# Patient Record
Sex: Male | Born: 1945 | Race: White | Hispanic: No | Marital: Married
Health system: Southern US, Community
[De-identification: ages and names within clinical notes are randomized; demographics above are authoritative.]

## PROBLEM LIST (undated history)

## (undated) DIAGNOSIS — H5440 Blindness, one eye, unspecified eye: Secondary | ICD-10-CM

## (undated) DIAGNOSIS — M6281 Muscle weakness (generalized): Secondary | ICD-10-CM

## (undated) DIAGNOSIS — I82493 Acute embolism and thrombosis of other specified deep vein of lower extremity, bilateral: Secondary | ICD-10-CM

## (undated) DIAGNOSIS — M810 Age-related osteoporosis without current pathological fracture: Secondary | ICD-10-CM

## (undated) DIAGNOSIS — I10 Essential (primary) hypertension: Secondary | ICD-10-CM

## (undated) DIAGNOSIS — G4700 Insomnia, unspecified: Secondary | ICD-10-CM

## (undated) DIAGNOSIS — L89300 Pressure ulcer of unspecified buttock, unstageable: Secondary | ICD-10-CM

## (undated) DIAGNOSIS — J189 Pneumonia, unspecified organism: Secondary | ICD-10-CM

## (undated) DIAGNOSIS — J90 Pleural effusion, not elsewhere classified: Secondary | ICD-10-CM

## (undated) DIAGNOSIS — E46 Unspecified protein-calorie malnutrition: Secondary | ICD-10-CM

## (undated) DIAGNOSIS — K5900 Constipation, unspecified: Secondary | ICD-10-CM

## (undated) DIAGNOSIS — Z9911 Dependence on respirator [ventilator] status: Secondary | ICD-10-CM

## (undated) DIAGNOSIS — I4891 Unspecified atrial fibrillation: Secondary | ICD-10-CM

## (undated) DIAGNOSIS — M069 Rheumatoid arthritis, unspecified: Secondary | ICD-10-CM

## (undated) DIAGNOSIS — J9620 Acute and chronic respiratory failure, unspecified whether with hypoxia or hypercapnia: Secondary | ICD-10-CM

## (undated) DIAGNOSIS — R498 Other voice and resonance disorders: Secondary | ICD-10-CM

## (undated) DIAGNOSIS — K219 Gastro-esophageal reflux disease without esophagitis: Secondary | ICD-10-CM

## (undated) DIAGNOSIS — G8251 Quadriplegia, C1-C4 complete: Secondary | ICD-10-CM

## (undated) DIAGNOSIS — G8929 Other chronic pain: Secondary | ICD-10-CM

## (undated) DIAGNOSIS — E876 Hypokalemia: Secondary | ICD-10-CM

## (undated) DIAGNOSIS — R339 Retention of urine, unspecified: Secondary | ICD-10-CM

## (undated) DIAGNOSIS — D649 Anemia, unspecified: Secondary | ICD-10-CM

## (undated) DIAGNOSIS — Z9181 History of falling: Secondary | ICD-10-CM

## (undated) DIAGNOSIS — N390 Urinary tract infection, site not specified: Secondary | ICD-10-CM

## (undated) DIAGNOSIS — R1312 Dysphagia, oropharyngeal phase: Secondary | ICD-10-CM

## (undated) DIAGNOSIS — I499 Cardiac arrhythmia, unspecified: Secondary | ICD-10-CM

## (undated) DIAGNOSIS — M1990 Unspecified osteoarthritis, unspecified site: Secondary | ICD-10-CM

## (undated) HISTORY — PX: TRACHEOSTOMY: SUR1362

## (undated) HISTORY — PX: VEIN SURGERY: SHX48

## (undated) HISTORY — DX: Essential (primary) hypertension: I10

## (undated) HISTORY — DX: Cardiac arrhythmia, unspecified: I49.9

## (undated) HISTORY — DX: Unspecified osteoarthritis, unspecified site: M19.90

## (undated) HISTORY — DX: Unspecified atrial fibrillation: I48.91

## (undated) HISTORY — PX: COLONOSCOPY, DIAGNOSTIC (SCREENING): SHX174

---
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
## 1973-06-13 DIAGNOSIS — I319 Disease of pericardium, unspecified: Secondary | ICD-10-CM

## 1973-06-13 HISTORY — DX: Disease of pericardium, unspecified: I31.9

## 1995-08-01 ENCOUNTER — Ambulatory Visit: Admit: 1995-08-01 | Disposition: A | Discharge status: Home / Self Care | Payer: Self-pay | Admitting: Internal Medicine

## 1995-08-11 ENCOUNTER — Ambulatory Visit: Admission: RE | Admit: 1995-08-11 | Payer: Self-pay | Source: Ambulatory Visit | Admitting: Gastroenterology

## 1996-06-17 ENCOUNTER — Ambulatory Visit: Admit: 1996-06-17 | Disposition: A | Discharge status: Home / Self Care | Payer: Self-pay

## 1997-08-28 ENCOUNTER — Ambulatory Visit: Admission: RE | Admit: 1997-08-28 | Payer: Self-pay | Source: Ambulatory Visit | Admitting: Gastroenterology

## 2003-07-24 ENCOUNTER — Ambulatory Visit: Admit: 2003-07-24 | Disposition: A | Discharge status: Home / Self Care | Payer: Self-pay | Source: Ambulatory Visit

## 2003-09-08 ENCOUNTER — Ambulatory Visit: Admission: AD | Admit: 2003-09-08 | Payer: Self-pay | Source: Ambulatory Visit | Admitting: Gastroenterology

## 2006-09-08 ENCOUNTER — Ambulatory Visit
Admission: AD | Admit: 2006-09-08 | Disposition: A | Discharge status: Home / Self Care | Payer: Self-pay | Source: Ambulatory Visit | Admitting: Internal Medicine

## 2006-10-02 ENCOUNTER — Ambulatory Visit
Admission: RE | Admit: 2006-10-02 | Disposition: A | Discharge status: Home / Self Care | Payer: Self-pay | Source: Ambulatory Visit | Admitting: Internal Medicine

## 2009-09-28 ENCOUNTER — Ambulatory Visit
Admission: RE | Admit: 2009-09-28 | Disposition: A | Discharge status: Home / Self Care | Payer: Self-pay | Source: Ambulatory Visit | Admitting: Internal Medicine

## 2012-06-14 NOTE — Op Note (Signed)
MRN: [id number] DOCUMENT ID: [id number]      INTRODUCTION:      67 YEAR OLD MALE PATIENT PRESENTS FOR AN ELECTIVE OUTPATIENT COLONOSCOPY.      THE INDICATIONS FOR THE PROCEDURE WERE FOLLOW-UP OF COLONIC POLYPS AND A      POSITIVE FAMILY HISTORY OF COLON CANCER.      CONSENT:      THE BENEFITS, RISKS, AND ALTERNATIVES TO THE PROCEDURE WERE DISCUSSED AND      INFORMED CONSENT WAS OBTAINED.      PREPARATION:      EKG, PULSE, PULSE OXIMETRY AND BLOOD PRESSURE MONITORED.      MEDICATIONS:      - MAC ANESTHESIA WAS ADMINISTERED      PROCEDURE:      RECTAL EXAM: NORMAL.      THE ENDOSCOPE WAS PASSED WITH A MODERATE AMOUNT OF DIFFICULTY TO THE CECUM      CONFIRMED BY APPENDICEAL ORIFICE AND ILEOCECAL VALVE.  RETROFLEXION WAS      PERFORMED IN THE RECTUM.  THE QUALITY OF THE PREPARATION WAS GOOD.  THE      STUDY WAS PERFORMED WITH A CF 160 AL AND CF 160 AL.      ASA CLASSIFICATION:      CLASS 2:  PATIENT HAS MILD TO MODERATE SYSTEMIC DISTURBANCE THAT MAY OR      MAY NOT BE RELATED TO THE DISORDER REQUIRING SURGERY.      FINDINGS:  SINGLE DIMINUTIVE POLYP WAS SEEN IN THE DISTAL ASCENDING COLON      WHICH MEASURED 6 MM.  DIMINUTIVE POLYP WAS SEEN IN THE TRANSVERSE COLON      WHICH MEASURED 5 MM.  ALL POLYPS WERE REMOVED BY EXCISIONAL BIOPSY.  THERE      WERE NUMEROUS DIVERTICULA PRESENT IN THE SIGMOID.  MODERATE INTERNAL      HEMORRHOIDS WERE PRESENT.      COMPLICATIONS:      THERE WERE NO COMPLICATIONS ASSOCIATED WITH THE PROCEDURE.      IMPRESSION:      1.  SINGLE DIMINUTIVE POLYP IN THE DISTAL ASCENDING COLON. [211.3].      2.  DIMINUTIVE POLYP IN THE TRANSVERSE COLON. [211.3]. ALL POLYPS WERE      REMOVED BY EXCISIONAL BIOPSY.      3.  NUMEROUS DIVERTICULA IN THE SIGMOID. [562.10].      4.  MODERATE INTERNAL HEMORRHOIDS [455.0].      RECOMMENDATION:      - AWAIT BIOPSY RESULTS.      - REPEAT COLONOSCOPY IN 5 YEARS.      SIGNING PHYSICIAN: Iyannah Blake L

## 2013-04-19 ENCOUNTER — Ambulatory Visit
Admission: RE | Admit: 2013-04-19 | Discharge: 2013-04-19 | Disposition: A | Discharge status: Home / Self Care | Payer: Medicare Other | Source: Ambulatory Visit | Attending: Internal Medicine | Admitting: Internal Medicine

## 2013-04-19 ENCOUNTER — Other Ambulatory Visit: Payer: Self-pay | Admitting: Internal Medicine

## 2013-04-19 DIAGNOSIS — Z01818 Encounter for other preprocedural examination: Secondary | ICD-10-CM | POA: Insufficient documentation

## 2013-10-29 ENCOUNTER — Other Ambulatory Visit: Payer: Self-pay | Admitting: Internal Medicine

## 2013-10-29 ENCOUNTER — Ambulatory Visit
Admission: RE | Admit: 2013-10-29 | Discharge: 2013-10-29 | Disposition: A | Discharge status: Home / Self Care | Payer: Medicare Other | Source: Ambulatory Visit | Attending: Internal Medicine | Admitting: Internal Medicine

## 2013-10-29 DIAGNOSIS — M545 Low back pain, unspecified: Secondary | ICD-10-CM

## 2014-03-07 ENCOUNTER — Ambulatory Visit: Payer: Medicare Other | Attending: Gastroenterology

## 2014-03-07 NOTE — Pre-Procedure Instructions (Signed)
Faxed cardio for recent workup, EKG/LON/any other tests, f [phone], p [phone].

## 2014-03-21 NOTE — Anesthesia Preprocedure Evaluation (Addendum)
Anesthesia Evaluation    AIRWAY    Mallampati: II    TM distance: >3 FB  Neck ROM: full  Mouth Opening:full   CARDIOVASCULAR    cardiovascular exam normal, irregular and normal       DENTAL    no notable dental hx     PULMONARY    pulmonary exam normal     OTHER FINDINGS              PSS Anesthesia Comments: HTN; A.fib on Eliquis; Echo - EF 60%; Last cardiac note under "Media" - "Outside Record" -J.Dorna Bloom        Anesthesia Plan    ASA 3     general                     intravenous induction   Detailed anesthesia plan: general IV  Monitors/Adjuncts: other    Post Op: other  Post op pain management: per surgeon    informed consent obtained

## 2014-03-26 ENCOUNTER — Ambulatory Visit
Admission: RE | Admit: 2014-03-26 | Discharge: 2014-03-26 | Disposition: A | Discharge status: Home / Self Care | Payer: Medicare Other | Source: Ambulatory Visit | Attending: Gastroenterology | Admitting: Gastroenterology

## 2014-03-26 ENCOUNTER — Ambulatory Visit: Payer: Medicare Other | Admitting: Physician Assistant

## 2014-03-26 ENCOUNTER — Encounter
Admission: RE | Disposition: A | Discharge status: Home / Self Care | Payer: Self-pay | Source: Ambulatory Visit | Attending: Gastroenterology

## 2014-03-26 ENCOUNTER — Ambulatory Visit: Payer: Medicare Other | Admitting: Gastroenterology

## 2014-03-26 ENCOUNTER — Ambulatory Visit: Payer: Self-pay

## 2014-03-26 DIAGNOSIS — K5730 Diverticulosis of large intestine without perforation or abscess without bleeding: Secondary | ICD-10-CM | POA: Insufficient documentation

## 2014-03-26 DIAGNOSIS — Z86010 Personal history of colonic polyps: Secondary | ICD-10-CM | POA: Insufficient documentation

## 2014-03-26 DIAGNOSIS — D120 Benign neoplasm of cecum: Secondary | ICD-10-CM | POA: Insufficient documentation

## 2014-03-26 DIAGNOSIS — Z1211 Encounter for screening for malignant neoplasm of colon: Secondary | ICD-10-CM

## 2014-03-26 DIAGNOSIS — Z7901 Long term (current) use of anticoagulants: Secondary | ICD-10-CM | POA: Insufficient documentation

## 2014-03-26 DIAGNOSIS — I10 Essential (primary) hypertension: Secondary | ICD-10-CM | POA: Insufficient documentation

## 2014-03-26 DIAGNOSIS — K648 Other hemorrhoids: Secondary | ICD-10-CM | POA: Insufficient documentation

## 2014-03-26 DIAGNOSIS — Z882 Allergy status to sulfonamides status: Secondary | ICD-10-CM | POA: Insufficient documentation

## 2014-03-26 DIAGNOSIS — Z800 Family history of malignant neoplasm of digestive organs: Secondary | ICD-10-CM | POA: Insufficient documentation

## 2014-03-26 DIAGNOSIS — Z87891 Personal history of nicotine dependence: Secondary | ICD-10-CM | POA: Insufficient documentation

## 2014-03-26 DIAGNOSIS — D123 Benign neoplasm of transverse colon: Secondary | ICD-10-CM | POA: Insufficient documentation

## 2014-03-26 HISTORY — PX: COLONOSCOPY: SHX174

## 2014-03-26 SURGERY — DONT USE, USE 1094-COLONOSCOPY, DIAGNOSTIC (SCREENING)
Anesthesia: Anesthesia General | Site: Abdomen | Wound class: Clean Contaminated

## 2014-03-26 MED ORDER — FENTANYL CITRATE 0.05 MG/ML IJ SOLN
INTRAMUSCULAR | Status: DC | PRN
Start: 2014-03-26 — End: 2014-03-26
  Administered 2014-03-26: 50 ug via INTRAVENOUS
  Administered 2014-03-26 (×2): 25 ug via INTRAVENOUS

## 2014-03-26 MED ORDER — LACTATED RINGERS IV SOLN
INTRAVENOUS | Status: DC
Start: 2014-03-26 — End: 2014-03-26

## 2014-03-26 MED ORDER — LIDOCAINE HCL 2 % IJ SOLN
INTRAMUSCULAR | Status: DC | PRN
Start: 2014-03-26 — End: 2014-03-26
  Administered 2014-03-26: 50 mg

## 2014-03-26 MED ORDER — FENTANYL CITRATE 0.05 MG/ML IJ SOLN
INTRAMUSCULAR | Status: AC
Start: 2014-03-26 — End: ?
  Filled 2014-03-26: qty 2

## 2014-03-26 MED ORDER — PROPOFOL 10 MG/ML IV EMUL
INTRAVENOUS | Status: AC
Start: 2014-03-26 — End: ?
  Filled 2014-03-26: qty 20

## 2014-03-26 MED ORDER — PROPOFOL 10 MG/ML IV EMUL
INTRAVENOUS | Status: DC | PRN
Start: 2014-03-26 — End: 2014-03-26
  Administered 2014-03-26: 50 mg via INTRAVENOUS
  Administered 2014-03-26: 40 mg via INTRAVENOUS
  Administered 2014-03-26: 50 mg via INTRAVENOUS
  Administered 2014-03-26: 70 mg via INTRAVENOUS

## 2014-03-26 MED ORDER — LIDOCAINE HCL 2 % IJ SOLN
INTRAMUSCULAR | Status: AC
Start: 2014-03-26 — End: ?
  Filled 2014-03-26: qty 20

## 2014-03-26 SURGICAL SUPPLY — 30 items
FORCEPS BIOPSY L240 CM JUMBO MICROMESH (Instrument)
FORCEPS BIOPSY L240 CM JUMBO MICROMESH TEETH STREAMLINE CATHETER (Instrument) IMPLANT
FORCEPS BIOPSY L240 CM LARGE CAPACITY (Instrument)
FORCEPS BIOPSY L240 CM MICROMESH TEETH STREAMLINE CATHETER NEEDLE (Instrument) IMPLANT
FORCEPS JAW RADIAL JUMBO (Instrument)
FORCEPS RAD JAW 4 BIOSPY W/NDL (Instrument)
GAUZE SPONGE 4X4 NS (Dressing) ×1
GLOVES EXAM NITRILE ETS LG NS (Glove) ×4 IMPLANT
GOWN CP ELSTC WRIST REG/LG BL (Gown) ×2
GOWN ISL PP PE REG LG LF FULL BCK NK TIE (Gown) ×2
GOWN ISOLATION REGULAR LARGE FULL BACK NECK TIE ELASTIC CUFF (Gown) ×2 IMPLANT
MARKER ENDOSCOPIC PERMANENT INDICATION (Syringes, Needles)
MARKER ENDOSCOPIC PERMANENT INDICATION DARK SYRINGE SPOT EX 5 ML (Syringes, Needles) IMPLANT
NEEDLE CARR-LOCKE INJECT 25GX5 (Needles) ×2 IMPLANT
PAD ELECTROSRG GRND REM W CRD (Procedure Accessories) ×1 IMPLANT
PROBE ELECTROSURGICAL L220 CM FLEXIBLE (Procedure Accessories)
PROBE ELECTROSURGICAL L220 CM FLEXIBLE STRAIGHT FIRE OD2.3 MM FIAPC (Procedure Accessories) IMPLANT
PROBE FIAPC STRGHT FIRE 220CM (Procedure Accessories)
SNARE CAPTIVATOR 13MMX240CM (GE Lab Supplies) ×1
SNARE ELECTRO SURG 20MM (Procedure Accessories) IMPLANT
SNARE ESCP MIC CPTVTR 13MM 240IN STRL (GE Lab Supplies) ×1
SNARE SENSATION OVAL 27MM (Endoscopic Supplies) IMPLANT
SNARE SMALL HEXAGON CAPTIVATOR STIFF ENDOSCOPIC POLYPECTOMY (GE Lab Supplies) IMPLANT
SPONGE GAUZE L4 IN X W4 IN 16 PLY (Dressing) ×1
SPONGE GAUZE L4 IN X W4 IN 16 PLY MAXIMUM ABSORBENT USP TYPE VII (Dressing) ×1 IMPLANT
SYRING SPOT EX ENDO MARK 5CC (Syringes, Needles)
SYRINGE 50 ML GRADUATE NONPYROGENIC DEHP (Syringes, Needles)
SYRINGE 50 ML GRADUATE NONPYROGENIC DEHP FREE PVC FREE BD MEDICAL (Syringes, Needles) IMPLANT
SYRINGE SLIP-TIP 60CC (Syringes, Needles)
TRAP  MUCOUS SPECIMEN 40CC (Procedure Accessories) ×1 IMPLANT

## 2014-03-26 NOTE — Progress Notes (Signed)
Patient meet discharge criteria at this time. Verbalized readiness to go home as well as his wife. Discharge instructions provided and explained to patient and family, verbalized good understanding.Patient aware when to follow-up with the doctor.

## 2014-03-26 NOTE — Discharge Instructions (Signed)
Endoscopy Discharge Instructions  General Instructions:  1. Following sedation, your judgement, perception, and coordination are considered impaired. Even though you may feel awake and alert, you are considered legally intoxicated. Therefore, until the next morning;   Do not Drive   Do not operate appliances or equipment that requires reaction time (e.g. Stove,electrical tools, machinery)   Do not sign legal documents or be involved in important decisions.   Do not smoke if alone   Do not drink alcoholic beverages   Go directly home and rest for several hours before resuming your routine activities.   It is highly recommended to have a responsible adult stay with you for the next 24 hours    2. Tenderness, swelling or pain may occur at the IV site where you received sedation. If you experience this, apply warm soaks to the area. Notify your physician if this persists.    Instructions Specific To Procedures - Report To Physician Any Of The Following:       Colonoscopy/Sigmoidoscopy/Proctoscopy   1. Severe and persistent abdominal pain/bloating which does not subside within 2-3 hours   2. Large amount of rectal bleeding (some mucosal blood streaking may occur, especially if biopsy or polypectomy was done or if hemorrhoids are present).   3. Nausea/vomiting   4. Fevers/Chills within 24 hours after procedure. Temp>101deg F      Additional Discharge Instructions  Your diet after the procedure: Continue prior diet  Special Instructions: Resume Eliquis on Friday.  Prescriptions given: No  Patient education literature given; Yes      If you have questions or problems contact your MD immediately. If you need immediate attention, call your MD, 911 and/or go to nearest emergency room.

## 2014-03-26 NOTE — Anesthesia Postprocedure Evaluation (Signed)
Anesthesia Post Evaluation    Patient: Ernest Diaz    Procedures performed: Procedure(s) with comments:  COLONOSCOPY - COLONOSCOPY  Q=NONE, ANES=MAC    Anesthesia type: General TIVA    Patient location:Phase II PACU    Last vitals:   Filed Vitals:    03/26/14 1442   BP: 149/80   Pulse: 74   Temp: 36.6 C (97.9 F)   SpO2: 98%       Post pain: Patient not complaining of pain, continue current therapy      Mental Status:awake    Respiratory Function: tolerating room air    Cardiovascular: stable    Nausea/Vomiting: patient not complaining of nausea or vomiting    Hydration Status: adequate    Post assessment: no apparent anesthetic complications

## 2014-03-26 NOTE — H&P (Signed)
GI PRE PROCEDURE NOTE    Proceduralist Comments:   Review of Systems and Past Medical / Surgical History performed: Yes     Indications:History of colonic polyps    Previous Adverse Reaction to Anesthesia or Sedation (if yes, describe): No    Physical Exam / Laboratory Data (If applicable)   Airway Classification: Class II    General: Alert and cooperative  Lungs: Lungs clear to auscultation  Cardiac: RRR, normal S1S2.    Abdomen: Soft, non tender. Normal active bowel sounds  Other:     No labs drawn    American Society of Anesthesiologists (ASA) Physical Status Classification:   ASA 3   Planned Sedation:   Deep sedation with anesthesia    Attestation:   Ernest Diaz III has been reassessed immediately prior to the procedure and is an appropriate candidate for the planned sedation and procedure. Risks, benefits and alternatives to the planned procedure and sedation have been explained to the patient or guardian:  yes        Signed by: Karen Kays

## 2014-03-26 NOTE — Transfer of Care (Signed)
Anesthesia Transfer of Care Note    Patient: Ernest Diaz    Procedures performed: Procedure(s) with comments:  COLONOSCOPY - COLONOSCOPY  Q=NONE, ANES=MAC    Anesthesia type: General TIVA    Patient location:Phase II PACU    Last vitals:   Filed Vitals:    03/26/14 1442   BP: 149/80   Pulse: 74   Temp: 36.6 C (97.9 F)   SpO2: 98%       Post pain: Patient not complaining of pain, continue current therapy      Mental Status:awake    Respiratory Function: tolerating room air    Cardiovascular: stable    Nausea/Vomiting: patient not complaining of nausea or vomiting    Hydration Status: adequate    Post assessment: no apparent anesthetic complications

## 2014-03-27 ENCOUNTER — Encounter: Payer: Self-pay | Admitting: Gastroenterology

## 2014-03-28 LAB — LAB USE ONLY - HISTORICAL SURGICAL PATHOLOGY

## 2015-03-26 ENCOUNTER — Other Ambulatory Visit: Payer: Self-pay

## 2015-04-10 ENCOUNTER — Other Ambulatory Visit: Payer: Self-pay

## 2015-08-17 ENCOUNTER — Inpatient Hospital Stay: Payer: Medicare Other | Admitting: Internal Medicine

## 2015-08-17 ENCOUNTER — Inpatient Hospital Stay
Admission: AD | Admit: 2015-08-17 | Discharge: 2015-09-24 | DRG: 003 | Disposition: A | Discharge status: Other Acute Inpatient Hospital | Payer: Medicare Other | Source: Other Acute Inpatient Hospital | Attending: Hospitalist | Admitting: Hospitalist

## 2015-08-17 ENCOUNTER — Other Ambulatory Visit: Payer: Self-pay

## 2015-08-17 DIAGNOSIS — G4700 Insomnia, unspecified: Secondary | ICD-10-CM | POA: Diagnosis not present

## 2015-08-17 DIAGNOSIS — Z7189 Other specified counseling: Secondary | ICD-10-CM | POA: Insufficient documentation

## 2015-08-17 DIAGNOSIS — T17500A Unspecified foreign body in bronchus causing asphyxiation, initial encounter: Secondary | ICD-10-CM | POA: Insufficient documentation

## 2015-08-17 DIAGNOSIS — T84226A Displacement of internal fixation device of vertebrae, initial encounter: Secondary | ICD-10-CM | POA: Diagnosis not present

## 2015-08-17 DIAGNOSIS — R0602 Shortness of breath: Secondary | ICD-10-CM | POA: Insufficient documentation

## 2015-08-17 DIAGNOSIS — Z882 Allergy status to sulfonamides status: Secondary | ICD-10-CM

## 2015-08-17 DIAGNOSIS — J690 Pneumonitis due to inhalation of food and vomit: Secondary | ICD-10-CM | POA: Diagnosis not present

## 2015-08-17 DIAGNOSIS — M4312 Spondylolisthesis, cervical region: Secondary | ICD-10-CM | POA: Diagnosis present

## 2015-08-17 DIAGNOSIS — J9602 Acute respiratory failure with hypercapnia: Secondary | ICD-10-CM | POA: Diagnosis not present

## 2015-08-17 DIAGNOSIS — Z66 Do not resuscitate: Secondary | ICD-10-CM | POA: Diagnosis not present

## 2015-08-17 DIAGNOSIS — G9340 Encephalopathy, unspecified: Secondary | ICD-10-CM | POA: Diagnosis not present

## 2015-08-17 DIAGNOSIS — I482 Chronic atrial fibrillation, unspecified: Secondary | ICD-10-CM

## 2015-08-17 DIAGNOSIS — Z7901 Long term (current) use of anticoagulants: Secondary | ICD-10-CM

## 2015-08-17 DIAGNOSIS — E872 Acidosis: Secondary | ICD-10-CM | POA: Diagnosis not present

## 2015-08-17 DIAGNOSIS — B9689 Other specified bacterial agents as the cause of diseases classified elsewhere: Secondary | ICD-10-CM | POA: Diagnosis not present

## 2015-08-17 DIAGNOSIS — D649 Anemia, unspecified: Secondary | ICD-10-CM | POA: Diagnosis not present

## 2015-08-17 DIAGNOSIS — E8770 Fluid overload, unspecified: Secondary | ICD-10-CM | POA: Diagnosis not present

## 2015-08-17 DIAGNOSIS — R52 Pain, unspecified: Secondary | ICD-10-CM | POA: Insufficient documentation

## 2015-08-17 DIAGNOSIS — I82812 Embolism and thrombosis of superficial veins of left lower extremities: Secondary | ICD-10-CM | POA: Diagnosis not present

## 2015-08-17 DIAGNOSIS — I4891 Unspecified atrial fibrillation: Secondary | ICD-10-CM

## 2015-08-17 DIAGNOSIS — A419 Sepsis, unspecified organism: Secondary | ICD-10-CM | POA: Diagnosis not present

## 2015-08-17 DIAGNOSIS — W19XXXA Unspecified fall, initial encounter: Secondary | ICD-10-CM | POA: Diagnosis present

## 2015-08-17 DIAGNOSIS — Z515 Encounter for palliative care: Secondary | ICD-10-CM | POA: Diagnosis not present

## 2015-08-17 DIAGNOSIS — I10 Essential (primary) hypertension: Secondary | ICD-10-CM | POA: Diagnosis present

## 2015-08-17 DIAGNOSIS — E876 Hypokalemia: Secondary | ICD-10-CM | POA: Diagnosis not present

## 2015-08-17 DIAGNOSIS — G9520 Unspecified cord compression: Secondary | ICD-10-CM

## 2015-08-17 DIAGNOSIS — S12600A Unspecified displaced fracture of seventh cervical vertebra, initial encounter for closed fracture: Secondary | ICD-10-CM | POA: Diagnosis present

## 2015-08-17 DIAGNOSIS — T17890A Other foreign object in other parts of respiratory tract causing asphyxiation, initial encounter: Secondary | ICD-10-CM | POA: Diagnosis present

## 2015-08-17 DIAGNOSIS — R579 Shock, unspecified: Secondary | ICD-10-CM | POA: Diagnosis not present

## 2015-08-17 DIAGNOSIS — J9601 Acute respiratory failure with hypoxia: Secondary | ICD-10-CM | POA: Diagnosis not present

## 2015-08-17 DIAGNOSIS — I770 Arteriovenous fistula, acquired: Secondary | ICD-10-CM

## 2015-08-17 DIAGNOSIS — J9600 Acute respiratory failure, unspecified whether with hypoxia or hypercapnia: Secondary | ICD-10-CM | POA: Diagnosis not present

## 2015-08-17 DIAGNOSIS — Z7982 Long term (current) use of aspirin: Secondary | ICD-10-CM

## 2015-08-17 DIAGNOSIS — Q288 Other specified congenital malformations of circulatory system: Secondary | ICD-10-CM

## 2015-08-17 DIAGNOSIS — I82411 Acute embolism and thrombosis of right femoral vein: Secondary | ICD-10-CM | POA: Diagnosis not present

## 2015-08-17 DIAGNOSIS — Z79899 Other long term (current) drug therapy: Secondary | ICD-10-CM

## 2015-08-17 DIAGNOSIS — R531 Weakness: Secondary | ICD-10-CM

## 2015-08-17 DIAGNOSIS — B952 Enterococcus as the cause of diseases classified elsewhere: Secondary | ICD-10-CM | POA: Diagnosis not present

## 2015-08-17 DIAGNOSIS — E870 Hyperosmolality and hypernatremia: Secondary | ICD-10-CM | POA: Diagnosis not present

## 2015-08-17 DIAGNOSIS — M4802 Spinal stenosis, cervical region: Secondary | ICD-10-CM | POA: Diagnosis present

## 2015-08-17 DIAGNOSIS — R740 Nonspecific elevation of levels of transaminase and lactic acid dehydrogenase [LDH]: Secondary | ICD-10-CM | POA: Diagnosis present

## 2015-08-17 DIAGNOSIS — R001 Bradycardia, unspecified: Secondary | ICD-10-CM | POA: Diagnosis not present

## 2015-08-17 DIAGNOSIS — J90 Pleural effusion, not elsewhere classified: Secondary | ICD-10-CM | POA: Diagnosis not present

## 2015-08-17 DIAGNOSIS — S12500A Unspecified displaced fracture of sixth cervical vertebra, initial encounter for closed fracture: Principal | ICD-10-CM | POA: Diagnosis present

## 2015-08-17 DIAGNOSIS — G8250 Quadriplegia, unspecified: Secondary | ICD-10-CM

## 2015-08-17 DIAGNOSIS — N390 Urinary tract infection, site not specified: Secondary | ICD-10-CM | POA: Diagnosis not present

## 2015-08-17 DIAGNOSIS — Z87891 Personal history of nicotine dependence: Secondary | ICD-10-CM

## 2015-08-17 DIAGNOSIS — M1990 Unspecified osteoarthritis, unspecified site: Secondary | ICD-10-CM | POA: Diagnosis present

## 2015-08-17 DIAGNOSIS — I9589 Other hypotension: Secondary | ICD-10-CM

## 2015-08-17 DIAGNOSIS — Q279 Congenital malformation of peripheral vascular system, unspecified: Secondary | ICD-10-CM

## 2015-08-17 DIAGNOSIS — S14106A Unspecified injury at C6 level of cervical spinal cord, initial encounter: Secondary | ICD-10-CM | POA: Diagnosis present

## 2015-08-17 DIAGNOSIS — R1312 Dysphagia, oropharyngeal phase: Secondary | ICD-10-CM

## 2015-08-17 LAB — PT AND APTT
PT INR: 1.1 (ref 0.9–1.1)
PT: 13.8 s (ref 12.6–15.0)
PTT: 24 s (ref 23–37)

## 2015-08-17 LAB — COMPREHENSIVE METABOLIC PANEL
ALT: 62 U/L — ABNORMAL HIGH (ref 0–55)
AST (SGOT): 61 U/L — ABNORMAL HIGH (ref 5–34)
Albumin/Globulin Ratio: 0.8 — ABNORMAL LOW (ref 0.9–2.2)
Albumin: 2.5 g/dL — ABNORMAL LOW (ref 3.5–5.0)
Alkaline Phosphatase: 62 U/L (ref 38–106)
BUN: 24 mg/dL (ref 9.0–28.0)
Bilirubin, Total: 0.5 mg/dL (ref 0.2–1.2)
CO2: 27 mEq/L (ref 22–29)
Calcium: 8.8 mg/dL (ref 7.9–10.2)
Chloride: 100 mEq/L (ref 100–111)
Creatinine: 0.6 mg/dL — ABNORMAL LOW (ref 0.7–1.3)
Globulin: 3.3 g/dL (ref 2.0–3.6)
Glucose: 155 mg/dL — ABNORMAL HIGH (ref 70–100)
Potassium: 4.4 mEq/L (ref 3.5–5.1)
Protein, Total: 5.8 g/dL — ABNORMAL LOW (ref 6.0–8.3)
Sodium: 135 mEq/L — ABNORMAL LOW (ref 136–145)

## 2015-08-17 LAB — GFR: EGFR: >60

## 2015-08-17 MED ORDER — ONDANSETRON HCL 4 MG/2ML IJ SOLN
4.0000 mg | Freq: Four times a day (QID) | INTRAMUSCULAR | Status: DC | PRN
Start: 2015-08-17 — End: 2015-09-24
  Administered 2015-08-30 – 2015-09-20 (×4): 4 mg via INTRAVENOUS
  Filled 2015-08-17 (×5): qty 2

## 2015-08-17 MED ORDER — ACETAMINOPHEN 325 MG PO TABS
650.0000 mg | ORAL_TABLET | Freq: Four times a day (QID) | ORAL | Status: DC | PRN
Start: 2015-08-17 — End: 2015-08-21
  Administered 2015-08-18 – 2015-08-21 (×10): 650 mg via ORAL
  Filled 2015-08-17 (×10): qty 2

## 2015-08-17 MED ORDER — TRIAMTERENE-HCTZ 37.5-25 MG PO TABS
1.0000 | ORAL_TABLET | Freq: Every day | ORAL | Status: DC
Start: 2015-08-18 — End: 2015-08-29
  Administered 2015-08-18 – 2015-08-27 (×10): 1 via ORAL
  Filled 2015-08-17 (×11): qty 1

## 2015-08-17 MED ORDER — ATENOLOL 50 MG PO TABS
50.0000 mg | ORAL_TABLET | Freq: Every day | ORAL | Status: DC
Start: 2015-08-18 — End: 2015-08-18

## 2015-08-17 MED ORDER — NALOXONE HCL 0.4 MG/ML IJ SOLN (WRAP)
0.2000 mg | INTRAMUSCULAR | Status: DC | PRN
Start: 2015-08-17 — End: 2015-09-24

## 2015-08-17 MED ORDER — AMITRIPTYLINE HCL 10 MG PO TABS
10.0000 mg | ORAL_TABLET | Freq: Every evening | ORAL | Status: DC
Start: 2015-08-18 — End: 2015-08-30
  Administered 2015-08-18 – 2015-08-29 (×12): 10 mg via ORAL
  Filled 2015-08-17 (×17): qty 1

## 2015-08-17 MED ORDER — SODIUM CHLORIDE 0.9 % IV SOLN
INTRAVENOUS | Status: AC
Start: 2015-08-17 — End: 2015-08-18

## 2015-08-17 MED ORDER — SODIUM CHLORIDE 0.9 % IV SOLN
INTRAVENOUS | Status: DC
Start: 2015-08-17 — End: 2015-08-17

## 2015-08-17 MED ORDER — CALCIUM CARBONATE ANTACID 500 MG PO CHEW
1000.0000 mg | CHEWABLE_TABLET | Freq: Four times a day (QID) | ORAL | Status: DC | PRN
Start: 2015-08-17 — End: 2015-09-09

## 2015-08-17 MED ORDER — SENNOSIDES-DOCUSATE SODIUM 8.6-50 MG PO TABS
2.0000 | ORAL_TABLET | Freq: Two times a day (BID) | ORAL | Status: DC | PRN
Start: 2015-08-17 — End: 2015-09-01
  Administered 2015-08-24: 2 via ORAL
  Filled 2015-08-17 (×4): qty 2

## 2015-08-17 MED ORDER — AMLODIPINE BESYLATE 5 MG PO TABS
10.0000 mg | ORAL_TABLET | Freq: Every day | ORAL | Status: DC
Start: 2015-08-18 — End: 2015-08-29
  Administered 2015-08-18 – 2015-08-27 (×9): 10 mg via ORAL
  Filled 2015-08-17 (×11): qty 2

## 2015-08-17 MED ORDER — HYDRALAZINE HCL 20 MG/ML IJ SOLN
5.0000 mg | INTRAMUSCULAR | Status: DC | PRN
Start: 2015-08-17 — End: 2015-08-18

## 2015-08-17 NOTE — Plan of Care (Signed)
Problem: Safety  Goal: Patient will be free from injury during hospitalization  Outcome: Progressing

## 2015-08-17 NOTE — Plan of Care (Addendum)
Problem: Psychosocial and Spiritual Needs  Goal: Demonstrates ability to cope with hospitalization/illness  Outcome: Progressing  Pt admitted to [facility] around 1600 opens eyes spontaneously, MAE and follows commands, BUE grips weak but gross motor movements present, BLE stronger than BUE. VSS see flow sheets for trends, oxygen saturation maintained on RA, no BM, foley continues to drain adequate output, pt NPO pending angio tomorrow. Skin remains unchanged findings upon admission are as follows: abrasions to BLE, bruise to L hand and petechiae and rash to upper back, findings verified with Alfredo Bach RN, no PRN medications given, no additional significant events during shift, RN will continue to monitor pt.

## 2015-08-17 NOTE — H&P (Addendum)
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 male hx afib on Eliquis + aspirin, HTN who presented to [facility] 2/25 after a fall. CT head and spine were negative so he was discharged. 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 MRI cervical and thoracic spine showed spinal epidural hematoma extending from the foramen magnum to the upper thoracic spine. Neurosurgery (Dr. Jaynie Collins) was consulted. His Eliquis was reversed. He ultimately had C3-C5 and T3-T4 decompressive laminectomies 2/27 by Dr. Jaynie Collins. He noted extensive dorsal epidural venous plexus and when he dissected out these vessels they bled. Dr. Jaynie Collins requested he be transferred here for spinal angiogram. These symptoms are sudden onset, severe intensity, without alleviating factors.     Past Medical History:     Past Medical History   Diagnosis Date   . Hypertension    . Arthritis    . Pericarditis 1975       Past Surgical History:     Past Surgical History   Procedure Laterality Date   . Vein surgery     . Eye surgery  1960     Rt eye removed   . Colonoscopy       x2   . Colonoscopy N/A 03/26/2014     Procedure: COLONOSCOPY;  Surgeon: Karen Kays, MD;  Location: [facility];  Service: Gastroenterology;  Laterality: N/A;  COLONOSCOPY  Q=NONE, ANES=MAC       Family History:   History reviewed. No pertinent family history.   He denies family history of CVA.    Social History:     Social History     Social History   . Marital Status: Married     Spouse Name: N/A   . Number of Children: N/A   . Years of Education: N/A     Social History Main Topics   . Smoking status: Former Smoker     Quit date: 03/07/1986   . Smokeless tobacco: Never Used   . Alcohol Use:  1.2 - 1.8 oz/week     2-3 Shots of liquor per week      Comment: daily   . Drug Use: No   . Sexual Activity: Not on file     Other Topics Concern   . Not on file     Social History Narrative       Allergies:     Allergies   Allergen Reactions   . Sulfa Antibiotics Other (See Comments)     Lip swelling       Medications:     Prescriptions prior to admission   Medication Sig   . amLODIPine (NORVASC) 10 MG tablet Take 10 mg by mouth daily.   Marland Kitchen apixaban (ELIQUIS) 5 MG Take 5 mg by mouth every 12 (twelve) hours.   Marland Kitchen aspirin EC 81 MG EC tablet Take 81 mg by mouth daily.   Marland Kitchen atenolol (TENORMIN) 50 MG tablet Take 50 mg by mouth daily.   . Calcium Carb-Cholecalciferol (CALCIUM 600 + D PO) Take by mouth daily.   . Cholecalciferol (VITAMIN D3) 1000 UNITS Cap Take by mouth every mon, wed and fri.   . Flaxseed, Linseed, (FLAX SEEDS PO)  Take by mouth daily.   . folic acid (FOLVITE) 400 MCG tablet Take 400 mcg by mouth daily.   . Magnesium 300 MG Cap Take by mouth daily.   . Multiple Vitamins-Minerals (CENTRUM SILVER ADULT 50+ PO) Take by mouth daily.   . Omega-3 Fatty Acids (FISH OIL) 1200 MG Cap Take by mouth daily.   Marland Kitchen triamterene-hydrochlorothiazide (MAXZIDE-25) 37.5-25 MG per tablet Take 1 tablet by mouth daily.       Current Facility-Administered Medications   Medication Dose Route Frequency       Review of Systems:   All other systems were reviewed and are negative except: as above    Physical Exam:     Filed Vitals:    08/17/15 1800   BP: 132/73   Pulse: 65   Temp:    Resp: 12   SpO2: 97%       Intake and Output Summary (Last 24 hours) at Date Time    Intake/Output Summary (Last 24 hours) at 08/17/15 2021  Last data filed at 08/17/15 1813   Gross per 24 hour   Intake      0 ml   Output    400 ml   Net   -400 ml       General: awake, alert, oriented x 3; no acute distress.  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, prosthetic right eye, eomi, sclera anicteric  oropharynx clear without lesions, mucous membranes moist  Neck: supple,  no lymphadenopathy, no thyromegaly, no JVD, no carotid bruits, +soft collar  Cardiovascular: regular rate and rhythm, no murmurs, rubs or gallops  Lungs: clear to auscultation bilaterally, without wheezing, rhonchi, or rales  Abdomen: soft, non-tender, non-distended; no palpable masses, no hepatosplenomegaly, normoactive bowel sounds, no rebound or guarding  Extremities: no clubbing, cyanosis, or edema  Neuro: cranial nerves grossly intact, strength 4+/5 proximal arms, 3/5 handgrip, left leg 4+/5 strength, right leg 4/5 strength, sensation intact, follows commands 100% of the time  Skin: no rashes or lesions noted      Labs:     Results     Procedure Component Value Units Date/Time    Comprehensive metabolic panel [[id number]]  (Abnormal) Collected:  08/17/15 1721    Specimen Information:  Blood Updated:  08/17/15 1801     Glucose 155 (H) mg/dL      BUN 64.3 mg/dL      Creatinine 0.6 (L) mg/dL      Sodium 329 (L) mEq/L      Potassium 4.4 mEq/L      Chloride 100 mEq/L      CO2 27 mEq/L      Calcium 8.8 mg/dL      Protein, Total 5.8 (L) g/dL      Albumin 2.5 (L) g/dL      AST (SGOT) 61 (H) U/L      ALT 62 (H) U/L      Alkaline Phosphatase 62 U/L      Bilirubin, Total 0.5 mg/dL      Globulin 3.3 g/dL      Albumin/Globulin Ratio 0.8 (L)     GFR [[id number]] Collected:  08/17/15 1721     EGFR >60.0 Updated:  08/17/15 1801    OTHER LABORATORY SCANS [[id number]] Resulted:  08/17/15 1758     Updated:  08/17/15 1758    PT AND APTT [[id number]] Collected:  08/17/15 1721     PT 13.8 sec Updated:  08/17/15 1756     PT INR 1.1  PT Anticoag. Given Within 48 hrs. Other: Specify      PTT 24 sec           Radiology Results (24 Hour)     Procedure Component Value Units Date/Time    OTHER RADIOLOGY SCANS - 130560 SS [[id number]] Resulted:  08/17/15 1828    Order Status:  Completed Updated:  08/17/15 1828    OTHER RADIOLOGY SCANS - 130560 SS [[id number]] Resulted:  08/17/15 1828    Order Status:  Completed Updated:  08/17/15 1828    OTHER  RADIOLOGY SCANS - 130560 SS [[id number]] Resulted:  08/17/15 1828    Order Status:  Completed Updated:  08/17/15 1828    OTHER RADIOLOGY SCANS - 130560 SS [[id number]] Resulted:  08/17/15 1828    Order Status:  Completed Updated:  08/17/15 1828    OTHER RADIOLOGY SCANS - 130560 SS [[id number]] Resulted:  08/17/15 1828    Order Status:  Completed Updated:  08/17/15 1828    OTHER RADIOLOGY SCANS - 130560 SS [[id number]] Resulted:  08/17/15 1828    Order Status:  Completed Updated:  08/17/15 1828    OTHER RADIOLOGY SCANS - 130560 SS [[id number]] Resulted:  08/17/15 1828    Order Status:  Completed Updated:  08/17/15 1828    OTHER RADIOLOGY SCANS - 130560 SS [[id number]] Resulted:  08/17/15 1828    Order Status:  Completed Updated:  08/17/15 1828    OTHER RADIOLOGY SCANS - 130560 SS [[id number]] Resulted:  08/17/15 1828    Order Status:  Completed Updated:  08/17/15 1828    OTHER RADIOLOGY SCANS - 130560 SS [[id number]] Resulted:  08/17/15 1828    Order Status:  Completed Updated:  08/17/15 1828    OTHER RADIOLOGY SCANS - 130560 SS [[id number]] Resulted:  08/17/15 1828    Order Status:  Completed Updated:  08/17/15 1828        His MRIs have been uploaded into PACS. I did not upload his CT scans. Those are still in CDs in his green chart.    Assessment:     Patient Active Problem List   Diagnosis   . AVF (arteriovenous fistula)       70 yo male hx afib on Eliquis + aspirin, HTN who presented to [facility] 2/25 after fall, quadriparesis. He had cervicothoracic epidural hematoma s/p C3-C5 and T3-T4 decompressive laminectomies 2/27 by Dr. 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    Plan:   Traumatic cervicothoracic epidural hematoma s/p C3-C5 + T3-T4 decompressive laminectomies 2/27 by Dr. 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 Pre-op labs, EKG, pCXR. Neuro checks. PT/OT. Goal SBP 100-150.     Hx afib on Eliquis + aspirin, HTN - No antiplatelets or  anticoagulants. Defer to neurosurgery when he may start heparin for DVT prophylaxis.    DVT/GI prophylaxis - SCDs.     Status/Rationale: IMCU for q2h neuro checks.     Signed by: Melton Krebs, MD   AT:[id number], Jonita Albee, MD

## 2015-08-17 NOTE — Progress Notes (Signed)
Neurosurgery Note    Dr. Jaynie Collins aware of patient transfer.    - Will need pre-ob labs and blood work for IR procedure 08/18/15.      Micki Riley PA-C  Neurosurgery   Spectra: [phone]

## 2015-08-18 ENCOUNTER — Inpatient Hospital Stay: Payer: Medicare Other | Admitting: Anesthesiology

## 2015-08-18 ENCOUNTER — Ambulatory Visit: Admit: 2015-08-18 | Payer: Self-pay | Admitting: Body Imaging

## 2015-08-18 ENCOUNTER — Inpatient Hospital Stay: Payer: Medicare Other

## 2015-08-18 ENCOUNTER — Encounter: Payer: Self-pay | Admitting: Anesthesiology

## 2015-08-18 ENCOUNTER — Encounter
Admission: AD | Disposition: A | Discharge status: Other Acute Inpatient Hospital | Payer: Self-pay | Source: Other Acute Inpatient Hospital | Attending: Internal Medicine

## 2015-08-18 DIAGNOSIS — Q288 Other specified congenital malformations of circulatory system: Secondary | ICD-10-CM

## 2015-08-18 DIAGNOSIS — G8250 Quadriplegia, unspecified: Secondary | ICD-10-CM

## 2015-08-18 DIAGNOSIS — I4891 Unspecified atrial fibrillation: Secondary | ICD-10-CM | POA: Diagnosis present

## 2015-08-18 LAB — TYPE AND SCREEN
AB Screen Gel: NEGATIVE
ABO Rh: A NEG

## 2015-08-18 LAB — CBC AND DIFFERENTIAL
Basophils Absolute Automated: 0.01 10*3/uL (ref 0.00–0.20)
Basophils Automated: 0 %
Eosinophils Absolute Automated: 0 10*3/uL (ref 0.00–0.70)
Eosinophils Automated: 0 %
Hematocrit: 39.1 % — ABNORMAL LOW (ref 42.0–52.0)
Hgb: 13.2 g/dL (ref 13.0–17.0)
Immature Granulocytes Absolute: 0.09 10*3/uL — ABNORMAL HIGH
Immature Granulocytes: 1 %
Lymphocytes Absolute Automated: 1.05 10*3/uL (ref 0.50–4.40)
Lymphocytes Automated: 12 %
MCH: 33.2 pg — ABNORMAL HIGH (ref 28.0–32.0)
MCHC: 33.8 g/dL (ref 32.0–36.0)
MCV: 98.5 fL (ref 80.0–100.0)
MPV: 10.8 fL (ref 9.4–12.3)
Monocytes Absolute Automated: 0.9 10*3/uL (ref 0.00–1.20)
Monocytes: 10 %
Neutrophils Absolute: 6.84 10*3/uL (ref 1.80–8.10)
Neutrophils: 77 %
Nucleated RBC: 0 /100 WBC (ref 0–1)
Platelets: 231 10*3/uL (ref 140–400)
RBC: 3.97 10*6/uL — ABNORMAL LOW (ref 4.70–6.00)
RDW: 13 % (ref 12–15)
WBC: 8.89 10*3/uL (ref 3.50–10.80)

## 2015-08-18 LAB — COMPREHENSIVE METABOLIC PANEL
ALT: 56 U/L — ABNORMAL HIGH (ref 0–55)
AST (SGOT): 38 U/L — ABNORMAL HIGH (ref 5–34)
Albumin/Globulin Ratio: 0.9 (ref 0.9–2.2)
Albumin: 2.5 g/dL — ABNORMAL LOW (ref 3.5–5.0)
Alkaline Phosphatase: 61 U/L (ref 38–106)
BUN: 21 mg/dL (ref 9.0–28.0)
Bilirubin, Total: 0.5 mg/dL (ref 0.2–1.2)
CO2: 28 mEq/L (ref 22–29)
Calcium: 8.6 mg/dL (ref 7.9–10.2)
Chloride: 100 mEq/L (ref 100–111)
Creatinine: 0.6 mg/dL — ABNORMAL LOW (ref 0.7–1.3)
Globulin: 2.9 g/dL (ref 2.0–3.6)
Glucose: 123 mg/dL — ABNORMAL HIGH (ref 70–100)
Potassium: 4.3 mEq/L (ref 3.5–5.1)
Protein, Total: 5.4 g/dL — ABNORMAL LOW (ref 6.0–8.3)
Sodium: 134 mEq/L — ABNORMAL LOW (ref 136–145)

## 2015-08-18 LAB — GFR: EGFR: >60

## 2015-08-18 SURGERY — SPINAL ANGIOGRAM
Anesthesia: Anesthesia General

## 2015-08-18 MED ORDER — FENTANYL CITRATE (PF) 50 MCG/ML IJ SOLN (WRAP)
INTRAMUSCULAR | Status: AC
Start: 2015-08-18 — End: ?
  Filled 2015-08-18: qty 2

## 2015-08-18 MED ORDER — SODIUM CHLORIDE 0.9 % IV SOLN
INTRAVENOUS | Status: DC
Start: 2015-08-18 — End: 2015-08-19

## 2015-08-18 MED ORDER — LIDOCAINE 1% BUFFERED - CNR/OUTSOURCED
INTRAMUSCULAR | Status: AC | PRN
Start: 2015-08-18 — End: 2015-08-18
  Administered 2015-08-18: 4 mL via INTRADERMAL

## 2015-08-18 MED ORDER — GLYCOPYRROLATE 0.2 MG/ML IJ SOLN
INTRAMUSCULAR | Status: DC | PRN
Start: 2015-08-18 — End: 2015-08-18
  Administered 2015-08-18: .4 mg via INTRAVENOUS
  Administered 2015-08-18 (×2): 0.2 mg via INTRAVENOUS

## 2015-08-18 MED ORDER — LIDOCAINE 1% BUFFERED - CNR/OUTSOURCED
INTRAMUSCULAR | Status: AC
Start: 2015-08-18 — End: ?
  Filled 2015-08-18: qty 22

## 2015-08-18 MED ORDER — ONDANSETRON HCL 4 MG/2ML IJ SOLN
INTRAMUSCULAR | Status: AC
Start: 2015-08-18 — End: ?
  Filled 2015-08-18: qty 2

## 2015-08-18 MED ORDER — DEXAMETHASONE SODIUM PHOSPHATE 20 MG/5ML IJ SOLN
INTRAMUSCULAR | Status: AC
Start: 2015-08-18 — End: ?
  Filled 2015-08-18: qty 5

## 2015-08-18 MED ORDER — OXYCODONE HCL 5 MG PO TABS
5.0000 mg | ORAL_TABLET | ORAL | Status: DC | PRN
Start: 2015-08-18 — End: 2015-08-21
  Administered 2015-08-18 – 2015-08-21 (×10): 5 mg via ORAL
  Filled 2015-08-18 (×10): qty 1

## 2015-08-18 MED ORDER — NEOSTIGMINE METHYLSULFATE 1 MG/ML IJ SOLN
INTRAMUSCULAR | Status: DC | PRN
Start: 2015-08-18 — End: 2015-08-18
  Administered 2015-08-18: 1 mg via INTRAVENOUS
  Administered 2015-08-18: 4 mg via INTRAVENOUS

## 2015-08-18 MED ORDER — ROCURONIUM BROMIDE 50 MG/5ML IV SOLN
INTRAVENOUS | Status: AC
Start: 2015-08-18 — End: ?
  Filled 2015-08-18: qty 5

## 2015-08-18 MED ORDER — PROPOFOL 10 MG/ML IV EMUL (WRAP)
INTRAVENOUS | Status: AC
Start: 2015-08-18 — End: ?
  Filled 2015-08-18: qty 20

## 2015-08-18 MED ORDER — GLYCOPYRROLATE 0.2 MG/ML IJ SOLN
INTRAMUSCULAR | Status: AC
Start: 2015-08-18 — End: ?
  Filled 2015-08-18: qty 1

## 2015-08-18 MED ORDER — ATENOLOL 25 MG PO TABS
25.0000 mg | ORAL_TABLET | Freq: Every day | ORAL | Status: DC
Start: 2015-08-18 — End: 2015-08-29
  Administered 2015-08-21 – 2015-08-27 (×6): 25 mg via ORAL
  Filled 2015-08-18 (×8): qty 1

## 2015-08-18 MED ORDER — FENTANYL CITRATE (PF) 50 MCG/ML IJ SOLN (WRAP)
INTRAMUSCULAR | Status: AC
Start: 2015-08-18 — End: 2015-08-18
  Filled 2015-08-18: qty 2

## 2015-08-18 MED ORDER — MIDAZOLAM HCL 2 MG/2ML IJ SOLN
INTRAMUSCULAR | Status: DC | PRN
Start: 2015-08-18 — End: 2015-08-18
  Administered 2015-08-18: 2 mg via INTRAVENOUS

## 2015-08-18 MED ORDER — HYDROMORPHONE HCL 1 MG/ML IJ SOLN
0.5000 mg | INTRAMUSCULAR | Status: DC | PRN
Start: 2015-08-18 — End: 2015-08-18
  Administered 2015-08-18: 0.5 mg via INTRAVENOUS

## 2015-08-18 MED ORDER — HYDRALAZINE HCL 20 MG/ML IJ SOLN
5.0000 mg | INTRAMUSCULAR | Status: DC | PRN
Start: 2015-08-18 — End: 2015-08-29

## 2015-08-18 MED ORDER — ROCURONIUM BROMIDE 50 MG/5ML IV SOLN
INTRAVENOUS | Status: DC | PRN
Start: 2015-08-18 — End: 2015-08-18
  Administered 2015-08-18: 40 mg via INTRAVENOUS
  Administered 2015-08-18 (×2): 10 mg via INTRAVENOUS

## 2015-08-18 MED ORDER — EPHEDRINE SULFATE 50 MG/ML IJ SOLN
INTRAMUSCULAR | Status: DC | PRN
Start: 2015-08-18 — End: 2015-08-18
  Administered 2015-08-18 (×5): 5 mg via INTRAVENOUS

## 2015-08-18 MED ORDER — IOHEXOL 240 MG/ML IJ SOLN
259.0000 mL | Freq: Once | INTRAMUSCULAR | Status: AC | PRN
Start: 2015-08-18 — End: 2015-08-18
  Administered 2015-08-18: 259 mL

## 2015-08-18 MED ORDER — LIDOCAINE HCL (PF) 2 % IJ SOLN
INTRAMUSCULAR | Status: AC
Start: 2015-08-18 — End: ?
  Filled 2015-08-18: qty 5

## 2015-08-18 MED ORDER — ONDANSETRON HCL 4 MG/2ML IJ SOLN
INTRAMUSCULAR | Status: DC | PRN
Start: 2015-08-18 — End: 2015-08-18
  Administered 2015-08-18: 4 mg via INTRAVENOUS

## 2015-08-18 MED ORDER — HEPARIN (PORCINE) IN NACL 2-0.9 UNIT/ML-% IJ SOLN
INTRAMUSCULAR | Status: AC
Start: 2015-08-18 — End: ?
  Filled 2015-08-18: qty 3000

## 2015-08-18 MED ORDER — LABETALOL HCL 5 MG/ML IV SOLN
10.0000 mg | INTRAVENOUS | Status: DC | PRN
Start: 2015-08-18 — End: 2015-09-17

## 2015-08-18 MED ORDER — HYDROMORPHONE HCL 1 MG/ML IJ SOLN
INTRAMUSCULAR | Status: AC
Start: 2015-08-18 — End: 2015-08-18
  Filled 2015-08-18: qty 1

## 2015-08-18 MED ORDER — SODIUM CHLORIDE 0.9 % IJ SOLN
INTRAMUSCULAR | Status: AC
Start: 2015-08-18 — End: ?
  Filled 2015-08-18: qty 10

## 2015-08-18 MED ORDER — EPHEDRINE SULFATE 50 MG/ML IJ/IV SOLN (WRAP)
Status: AC
Start: 2015-08-18 — End: ?
  Filled 2015-08-18: qty 1

## 2015-08-18 MED ORDER — SODIUM CHLORIDE 0.9 % IV SOLN
INTRAVENOUS | Status: DC | PRN
Start: 2015-08-18 — End: 2015-08-18

## 2015-08-18 MED ORDER — LACTATED RINGERS IV SOLN
INTRAVENOUS | Status: DC | PRN
Start: 2015-08-18 — End: 2015-08-18

## 2015-08-18 MED ORDER — FENTANYL CITRATE (PF) 50 MCG/ML IJ SOLN (WRAP)
25.0000 ug | INTRAMUSCULAR | Status: AC | PRN
Start: 2015-08-18 — End: 2015-08-18
  Administered 2015-08-18 (×4): 25 ug via INTRAVENOUS

## 2015-08-18 MED ORDER — PHENYLEPHRINE 100 MCG/ML IN NACL 0.9% IV SOSY
PREFILLED_SYRINGE | INTRAVENOUS | Status: AC
Start: 2015-08-18 — End: ?
  Filled 2015-08-18: qty 5

## 2015-08-18 MED ORDER — PHENYLEPHRINE HCL 10 MG/ML IV SOLN (WRAP)
Status: DC | PRN
Start: 2015-08-18 — End: 2015-08-18
  Administered 2015-08-18 (×2): 50 ug via INTRAVENOUS
  Administered 2015-08-18 (×4): 100 ug via INTRAVENOUS
  Administered 2015-08-18: 50 ug via INTRAVENOUS
  Administered 2015-08-18 (×2): 100 ug via INTRAVENOUS

## 2015-08-18 MED ORDER — DEXAMETHASONE SODIUM PHOSPHATE 4 MG/ML IJ SOLN (WRAP)
INTRAMUSCULAR | Status: DC | PRN
Start: 2015-08-18 — End: 2015-08-18
  Administered 2015-08-18: 6 mg via INTRAVENOUS

## 2015-08-18 MED ORDER — PROPOFOL 10 MG/ML IV EMUL (WRAP)
INTRAVENOUS | Status: DC | PRN
Start: 2015-08-18 — End: 2015-08-18
  Administered 2015-08-18 (×3): 50 mg via INTRAVENOUS

## 2015-08-18 MED ORDER — LIDOCAINE HCL 2 % IJ SOLN
INTRAMUSCULAR | Status: DC | PRN
Start: 2015-08-18 — End: 2015-08-18
  Administered 2015-08-18: 100 mg

## 2015-08-18 MED ORDER — FENTANYL CITRATE (PF) 50 MCG/ML IJ SOLN (WRAP)
INTRAMUSCULAR | Status: DC | PRN
Start: 2015-08-18 — End: 2015-08-18
  Administered 2015-08-18: 50 ug via INTRAVENOUS

## 2015-08-18 MED ORDER — MIDAZOLAM HCL 2 MG/2ML IJ SOLN
INTRAMUSCULAR | Status: AC
Start: 2015-08-18 — End: ?
  Filled 2015-08-18: qty 2

## 2015-08-18 NOTE — Progress Notes (Signed)
MEDICINE PROGRESS NOTE    Date Time: 08/18/2015 8:53 AM  Patient Name: Ernest Diaz  Attending Physician: Isaias Cowman, MD    Assessment:   Principal Problem:    Thoracic spinal AV fistula  Active Problems:    Quadriplegia    Atrial fibrillation  Resolved Problems:    * No resolved hospital problems. *      Plan:   1. Traumatic cervicothoracic epidural hematoma s/p C3-C5 + T3-T4 decompressive laminectomies 2/27 by Dr. 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 Hx A Fib on Eliquis, CHADS2VASC of 2  - Rate controlled on Atenolol, will decrease to 25 mg q day given bradycardia to 40s overnight  - Patient asymptomatic with appropriate chronotropic response  - Hold AC and antiplatelets given procedure at this time     3. HTN - No acute issues  - Continue Norvasc  - Decrease Atenolol as above   - Continue Maxzide   - Goal < 150/90 as outpatient     DVT/GI prophylaxis - SCDs, no DVT chemical ppx until cleared by NSGY     Case discussed with: Patient, RN, Family, NSGY    Safety Checklist:     DVT prophylaxis:  CHEST guideline (See page [id number]) Mechanical   Foley:  Narberth Rn Foley protocol Not present   IVs:  Peripheral IV   PT/OT: Ordered   Daily CBC & or Chem ordered:  SHM/ABIM guidelines (see #5) Yes, due to clinical and lab instability   Reference for approximate charges of common labs: CBC auto diff - $76  BMP - $99  Mg - $79    Lines:     Patient Lines/Drains/Airways Status    Active PICC Line / CVC Line / PIV Line / Drain / Airway / Intraosseous Line / Epidural Line / ART Line / Line / Wound / Pressure Ulcer / NG/OG Tube     Name:   Placement date:   Placement time:   Site:   Days:    Peripheral IV Right Antecubital        Antecubital       Peripheral IV 08/17/15 Left;Lateral Antecubital  08/17/15   1723   Antecubital   less than 1     Urethral Catheter               Incision Site 08/17/15 Back Upper  08/17/15   1721     less than 1                 Disposition: (Please see PAF column for Expected D/C Date)   Today's date: 08/18/2015  Admit Date: 08/17/2015  4:29 PM  LOS: 1  Clinical Milestones: Neuro evaluation   Anticipated discharge needs: Per PT/OT recs      Subjective     CC: Arteriovenous fistula of spinal cord vessels    Interval History/24 hour events: No acute events overnight.  Reports some occasional neck pain.  Denies chest pain, shortness of breath, abdominal pain, nausea/vomiting, diarrhea.  No active bleeding noted.  Noted to have bradycardia overnight however asymptomatic and with appropriate chronotropic response.       Review of Systems:     Review of Systems - Negative except as noted above in HPI      Physical  Exam:     VITAL SIGNS PHYSICAL EXAM   Temp:  [97.5 F (36.4 C)-98.1 F (36.7 C)] 97.7 F (36.5 C)  Heart Rate:  [54-77] 54  Resp Rate:  [8-26] 10  BP: (115-143)/(62-105) 134/78 mmHg      Intake/Output Summary (Last 24 hours) at 08/18/15 0853  Last data filed at 08/18/15 0600   Gross per 24 hour   Intake   1080 ml   Output   1225 ml   Net   -145 ml    Physical Exam  General: awake, alert X 3  Cardiovascular: irregularly irregular,  no murmurs, rubs or gallops  Lungs: clear to auscultation bilaterally, without wheezing, rhonchi, or rales  Abdomen: soft, non-tender, non-distended; no palpable masses,  normoactive bowel sounds  Extremities: no edema  Neuro: CN grossly intact, speech fluent, follows commands, 4/5 b/l UE and LE strength        Meds:     Medications were reviewed:    Labs:     Labs (last 72 hours):      Recent Labs  Lab 08/18/15  0440   WBC 8.89   HGB 13.2   HEMATOCRIT 39.1*   PLATELETS 231         Recent Labs  Lab 08/17/15  1721   PT 13.8   PT INR 1.1   PTT 24      Recent Labs  Lab 08/18/15  0440 08/17/15  1721   SODIUM 134* 135*   POTASSIUM 4.3 4.4   CHLORIDE 100 100   CO2 28 27   BUN 21.0 24.0   CREATININE  0.6* 0.6*   CALCIUM 8.6 8.8   ALBUMIN 2.5* 2.5*   PROTEIN, TOTAL 5.4* 5.8*   BILIRUBIN, TOTAL 0.5 0.5   ALKALINE PHOSPHATASE 61 62   ALT 56* 62*   AST (SGOT) 38* 61*   GLUCOSE 123* 155*                   Microbiology, reviewed and are significant for:  Microbiology Results     Procedure Component Value Units Date/Time    MRSA culture [[id number]] Collected:  08/17/15 2043    Specimen Information:  Body Fluid from Nares and Throat Updated:  08/18/15 0303            Imaging, reviewed and are significant for:  No results found.        Signed by: Isaias Cowman, MD

## 2015-08-18 NOTE — SLP Progress Note (Signed)
[facility]   Speech Therapy Cancellation Note      Patient:  Ernest Diaz MRN#:  [id number]  Unit:  [facility] TOWER 4 Room/Bed:  E332/R518.84    08/18/2015  Time: 1100    Patient not seen for speech therapy secondary to patient is NPO for procedure. RN also reports pt likely does not need swallow evaluation. If this is the case, please cancel orders. Otherwise, SLP will evaluate after spinal angiogram is completed.     Thank you,  Matt Jewell Ryans MS, CCC-SLP

## 2015-08-18 NOTE — Progress Notes (Signed)
Neurosurgery Progress Note  HD 2  S/P posterior cervical and thoracic laminectomies for evacuation of EDH    S:   Pain controlled    O:   Filed Vitals:    08/18/15 0600   BP: 134/78   Pulse: 54   Temp:    Resp: 10   SpO2: 97%         PHYSICAL EXAM:  Awake, alert  Oriented  Speech is fluent  B UE's 4+/5 except for grip 4-/5  B LE 4+/5          A/P:   Neuro exam stable  Spinal angiogram to be done this afternoon with Dr. Kenard Gower, MD

## 2015-08-18 NOTE — Plan of Care (Signed)
Problem: Safety  Goal: Patient will be free from injury during hospitalization  Outcome: Progressing  No changes to neuro status seen, pt still in a fib HR 40's-70's, MD aware of bradycardia, pt asymptomatic, no new orders at this time, SBP 120's-130's, pt remains afebrile, oxygen saturation maintained on RA, bowel sounds active in all quadrants, no BM during shift, AUOP seen in foley drainage bag, pt NPO for IR procedure today, pt off floor from 1230 until 1815. Skin remains unchanged, PRN tylenol given X1 for shoulder soreness with good effect, no additional significant events during shift, RN will continue to monitor pt.

## 2015-08-18 NOTE — Sedation Documentation (Signed)
Procedure complete. Vascade closure device placed, LOT [id number]    Ernest Diaz to hold manual pressure per protocol until hemostasis is achieved.

## 2015-08-18 NOTE — Procedures (Signed)
No angio evidence of aneurysm, avm or avf.    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

## 2015-08-18 NOTE — Progress Notes (Signed)
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 male hx afib on Eliquis + aspirin, HTN who presented to [facility] 2/25 after a fall. CT head and spine were negative so he was discharged. 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 MRI cervical and thoracic spine showed spinal epidural hematoma extending from the foramen magnum to the upper thoracic spine. Neurosurgery (Dr. Jaynie Collins) was consulted. His Eliquis was reversed. He ultimately had C3-C5 and T3-T4 decompressive laminectomies 2/27 by Dr. Jaynie Collins. He noted extensive dorsal epidural venous plexus and when he dissected out these vessels they bled. Dr. Jaynie Collins requested he be transferred here for spinal angiogram. These symptoms are sudden onset, severe intensity, without alleviating factors.          08/18/15 1114   Patient Type   Within 30 Days of Previous Admission? No   Medicare focused diagnosis patient? Not a Medicare focused diagnosis patient   Bundle patient? Not a bundle patient   Healthcare Decisions   Interviewed: Patient;Spouse;Family   Orientation/Decision Making Abilities of Patient Alert and Oriented x3, able to make decisions   Advance Directive Patient has advance directive, copy not in chart   Advance Directive not in Chart Copy requested from family/decision maker   Healthcare Agent's Name Ernest Diaz   Healthcare Agent's Phone Number [phone]   Prior to admission   Prior level of function Independent with ADLs   Type of Residence Private residence   Home Layout Two level;Bed/bath upstairs  (1/2 bath on the main level)   Have running water, electricity, heat, etc? Yes   Living Arrangements Spouse/significant other   How do you get to your MD appointments? family assist/self   How do you get your groceries? self   Who fixes your meals? self/spouse   Who does your laundry? self/spouse   Who picks up your prescriptions?  self/spouse   Dressing Needs assistance  (prior to admission was independent w/adl's)   Grooming Needs assistance   Feeding Needs assistance   Bathing Needs assistance   Toileting Needs assistance   DME Currently at Home (Husband does not have DME, wife has a Youth worker)   Home Care/Community Services (Has had home health in the past)   Prior SNF admission? (Detail) No   Prior Rehab admission? (Detail) No   Adult Protective Services (APS) involved? No   Discharge Planning   Support Systems Spouse/significant other;Children;Family members   Patient expects to be discharged to: Acute Rehab ( [facility]) versus SNF   Anticipated [place] plan discussed with: Same as interviewed   Follow up appointment scheduled? No   Reason no follow up scheduled? Family to schedule   Potential barriers to discharge: Decreased mobility;Cognitive level / capacity   Mode of transportation: Private car (family member)  (versus PTS ambulance)   Consults/Providers   PT Evaluation Needed 1   OT Evalulation Needed 1   SLP Evaluation Needed 2   Correct PCP listed in Epic? Yes   Important Message from [facility] Notice   Patient received 1st IMM Letter? Yes         SW spoke with the patient's husband and son at bedside and the spoke with the patient's son outside of the patient's room. Patient's son is the patient's healthcare decision maker because the patient's spouse has been diagnosed with Alzheimers. The patient was the primary caregiver for his  wife at home. The family is interested in ALF facilities in the area.    Everardo Pacific, MSW  Care Coordinator  [facility]  [phone]  [email or web address]

## 2015-08-18 NOTE — Sedation Documentation (Signed)
Dr. Lurline Idol agrees that patient will likely need PACU post procedure as he may be difficult to extubate. Colleen from PACU informed patient will arrive in about 2 hours (estimated).

## 2015-08-18 NOTE — PACU (Signed)
6:15PM Patient alert and oriented, comfortable on room air. No hematoma noted to rt groin. Transported to floor on tele, accompanied by Nurse. Relatives at bedside. Bedside handoff given to floor Nurse.

## 2015-08-18 NOTE — Sedation Documentation (Signed)
Patient intubated. Foley in place prior to arrival to IR. Bair huggar placed, medium setting.

## 2015-08-18 NOTE — UM Notes (Addendum)
Admit to inpatient:  08/17/15 1705    Presented to [facility] 2/25 after a fall.  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 MRI cervical and thoracic spine showed spinal epidural hematoma extending from the foramen magnum to the upper thoracic spine. Neurosurgery  was consulted. His Eliquis was reversed. He ultimately had C3-C5 and T3-T4 decompressive laminectomies 2/27 by Dr. Jaynie Collins. He noted extensive dorsal epidural venous plexus and when he dissected out these vessels they bled.  Transferred here for spinal angiogram.\\*Neuro: cranial nerves grossly intact, strength 4+/5 proximal arms, 3/5 handgrip, left leg 4+/5 strength, right leg 4/5 strength, sensation    A- fib on Eliquis + aspirin, HTN    Plan:Traumatic cervicothoracic epidural hematoma s/p C3-C5 + T3-T4 decompressive laminectomies 2/27 by Dr. 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     Pre-op labs, EKG, pCXR. Neuro checks. PT/OT    Admitted [facility]  unit: VS q 2  hr, Telemetry and pulse oximetry: Neuro checks q 2 hr,  IVF @ 50cc/hr    3/7    Currently in the OR  IVF @ 100, Fentanyl 25 mag iv x1, Neuro checks q 2 hr

## 2015-08-18 NOTE — Plan of Care (Addendum)
Please refer to flowsheets for vitals and trends.  Pt A&O xs 4.  Clear voice with out delayed responses.  Pt able to weakly grasp with BUE, but not able to hold anything or feed himself.  Normal BLE motor and sensation.  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 RN gave tylenol 1xs and oxycodone 1xs for right shoulder pain, with moderate results.  Pt in controlled A-feb.  HR 45 - 70 with frequent PVCs.  BP stable and under 150 systolic.  Afebrile.  NPO at midnight for spinal angiogram tomorrow.  No BM.  AUO from foley (for retention; PTA).  Pt updated and support given.

## 2015-08-18 NOTE — Anesthesia Preprocedure Evaluation (Signed)
Anesthesia Evaluation    AIRWAY    Mallampati: III    TM distance: >3 FB  Neck ROM: limited  Mouth Opening:limited   CARDIOVASCULAR    irregular       DENTAL           PULMONARY    clear to auscultation, decreased breath sounds, LLF and RLF     OTHER FINDINGS                      Anesthesia Plan    ASA 4     general                     intravenous induction   Detailed anesthesia plan: general endotracheal  Monitors/Adjuncts: arterial line (possible)    Post Op: post-op ventilation and ICU (possible)  Post op pain management: per surgeon    informed consent obtained    Plan discussed with attending.    ECG reviewed  pertinent labs reviewed  imaging results reviewed

## 2015-08-18 NOTE — Consults (Signed)
INR    70 y/o man with presented with acute cervical epidural hematoma requiring surgical decompression.  Now referred for angiography to assess for underlying vascular malformation.      PMH: HTN, Arthritis, pericarditis, atrial fib.    All: sulfa    Meds: reviewed      PE:    VSS  Ms intact.  Right eye not moving (old injury)  Language normal  Bilateral upper extremity weakness (R>L)  Lower extremity antigravity strength.    Chest and abdomen normal      Impression:    Stable for angio.    Amedeo Kinsman MD

## 2015-08-18 NOTE — Transfer of Care (Signed)
Anesthesia Transfer of Care Note    Patient: Ernest Diaz    Procedures performed: Procedure(s):  Spinal Angiogram    Anesthesia type: General ETT    Patient location:Phase I PACU    Last vitals:   Filed Vitals:    08/18/15 1200   BP: 127/68   Pulse: 59   Temp: 36.7 C (98.1 F)   Resp: 16   SpO2: 99%       Post pain: Patient not complaining of pain, continue current therapy      Mental Status:awake and alert     Respiratory Function: tolerating face mask    Cardiovascular: stable    Nausea/Vomiting: patient not complaining of nausea or vomiting    Hydration Status: adequate    Post assessment: no apparent anesthetic complications, no reportable events, no evidence of recall and neuro exam at baseline, report to rn. all questions answered.

## 2015-08-19 ENCOUNTER — Other Ambulatory Visit: Payer: Self-pay

## 2015-08-19 ENCOUNTER — Inpatient Hospital Stay: Payer: Medicare Other

## 2015-08-19 LAB — CBC AND DIFFERENTIAL
Basophils Absolute Automated: 0.02 10*3/uL (ref 0.00–0.20)
Basophils Automated: 0 %
Eosinophils Absolute Automated: 0 10*3/uL (ref 0.00–0.70)
Eosinophils Automated: 0 %
Hematocrit: 40.4 % — ABNORMAL LOW (ref 42.0–52.0)
Hgb: 13.5 g/dL (ref 13.0–17.0)
Immature Granulocytes Absolute: 0.1 10*3/uL — ABNORMAL HIGH
Immature Granulocytes: 1 %
Lymphocytes Absolute Automated: 0.62 10*3/uL (ref 0.50–4.40)
Lymphocytes Automated: 6 %
MCH: 32.7 pg — ABNORMAL HIGH (ref 28.0–32.0)
MCHC: 33.4 g/dL (ref 32.0–36.0)
MCV: 97.8 fL (ref 80.0–100.0)
MPV: 10.4 fL (ref 9.4–12.3)
Monocytes Absolute Automated: 0.81 10*3/uL (ref 0.00–1.20)
Monocytes: 8 %
Neutrophils Absolute: 7.97 10*3/uL (ref 1.80–8.10)
Neutrophils: 84 %
Nucleated RBC: 0 /100 WBC (ref 0–1)
Platelets: 242 10*3/uL (ref 140–400)
RBC: 4.13 10*6/uL — ABNORMAL LOW (ref 4.70–6.00)
RDW: 13 % (ref 12–15)
WBC: 9.52 10*3/uL (ref 3.50–10.80)

## 2015-08-19 LAB — COMPREHENSIVE METABOLIC PANEL
ALT: 49 U/L (ref 0–55)
AST (SGOT): 34 U/L (ref 5–34)
Albumin/Globulin Ratio: 0.8 — ABNORMAL LOW (ref 0.9–2.2)
Albumin: 2.4 g/dL — ABNORMAL LOW (ref 3.5–5.0)
Alkaline Phosphatase: 64 U/L (ref 38–106)
BUN: 15 mg/dL (ref 9.0–28.0)
Bilirubin, Total: 0.6 mg/dL (ref 0.2–1.2)
CO2: 27 mEq/L (ref 22–29)
Calcium: 8.4 mg/dL (ref 7.9–10.2)
Chloride: 101 mEq/L (ref 100–111)
Creatinine: 0.6 mg/dL — ABNORMAL LOW (ref 0.7–1.3)
Globulin: 3.1 g/dL (ref 2.0–3.6)
Glucose: 123 mg/dL — ABNORMAL HIGH (ref 70–100)
Potassium: 4.7 mEq/L (ref 3.5–5.1)
Protein, Total: 5.5 g/dL — ABNORMAL LOW (ref 6.0–8.3)
Sodium: 136 mEq/L (ref 136–145)

## 2015-08-19 LAB — ECG 12-LEAD
Atrial Rate: 357 {beats}/min
Q-T Interval: 492 ms
QRS Duration: 90 ms
QTC Calculation (Bezet): 425 ms
R Axis: 41 degrees
T Axis: 50 degrees
Ventricular Rate: 45 {beats}/min

## 2015-08-19 LAB — GFR: EGFR: >60

## 2015-08-19 MED ORDER — INFUVITE ADULT IV INJ
INTRAVENOUS | Status: AC
Start: 2015-08-19 — End: 2015-08-20
  Filled 2015-08-19 (×2): qty 1000

## 2015-08-19 MED ORDER — GADOBUTROL 1 MMOL/ML IV SOLN
9.0000 mL | Freq: Once | INTRAVENOUS | Status: AC | PRN
Start: 2015-08-19 — End: 2015-08-19
  Administered 2015-08-19: 9 mmol via INTRAVENOUS

## 2015-08-19 NOTE — Progress Notes (Signed)
MEDICINE PROGRESS NOTE    Date Time: 08/19/2015 8:37 AM  Patient Name: Ernest Diaz  Attending Physician: Isaias Cowman, MD    Assessment:   Principal Problem:    Thoracic spinal AV fistula  Active Problems:    Quadriplegia    Atrial fibrillation  Resolved Problems:    * No resolved hospital problems. *      Plan:   1. Traumatic cervicothoracic epidural hematoma s/p C3-C5 + T3-T4 decompressive laminectomies 2/27 by Dr. Jaynie Collins  - Spinal angiogram did not show any obvious evidence of AVM or fistula   - NPO at MN for cervical exploration with NSGY, MRI C-spine for operative planning ordered   - Neuro checks q 2 h  - Hold AC given procedure  - Hydrate with IVF overnight to avoid hypoglycemia     2. Hx A Fib on Eliquis, CHADS2VASC of 2  - Rate controlled on Atenolol,bradycardia improved after decreasing dose to 25 mg q day   -Patient asymptomatic with appropriate chronotropic response  - Hold AC and antiplatelets given procedure at this time     3. HTN - No acute issues  - Continue Norvasc  - Decreased Atenolol as above   - Continue Maxzide   - Goal < 150/90 as outpatient     DVT/GI prophylaxis - SCDs, no DVT chemical ppx until cleared by NSGY     Case discussed with: Patient, RN, NSGY    Safety Checklist:     DVT prophylaxis:  CHEST guideline (See page [id number]) Mechanical   Foley:  [place] Rn Foley protocol Not present   IVs:  Peripheral IV   PT/OT: Ordered   Daily CBC & or Chem ordered:  SHM/ABIM guidelines (see #5) Yes, due to clinical and lab instability   Reference for approximate charges of common labs: CBC auto diff - $76  BMP - $99  Mg - $79    Lines:     Patient Lines/Drains/Airways Status    Active PICC Line / CVC Line / PIV Line / Drain / Airway / Intraosseous Line / Epidural Line / ART Line / Line / Wound / Pressure Ulcer / NG/OG Tube     Name:   Placement date:   Placement time:   Site:   Days:    Peripheral IV Right Antecubital        Antecubital       Peripheral IV 08/17/15 Left;Lateral  Antecubital  08/17/15   1723   Antecubital   less than 1    Urethral Catheter               Incision Site 08/17/15 Back Upper  08/17/15   1721     less than 1                 Disposition: (Please see PAF column for Expected D/C Date)   Today's date: 08/19/2015  Admit Date: 08/17/2015  4:29 PM  LOS: 2  Clinical Milestones: Neuro evaluation   Anticipated discharge needs: Per PT/OT recs      Subjective     CC: Arteriovenous fistula of spinal cord vessels    Interval History/24 hour events: No acute events overnight.  Spinal angiogram did not show obvious AVM or fistula.  Patient denies acute concerns/complaints this AM.  States he is otherwise doing ok.    Review of Systems:     Review of Systems - Negative except as noted above in HPI      Physical Exam:  VITAL SIGNS PHYSICAL EXAM   Temp:  [97 F (36.1 C)-98.1 F (36.7 C)] 98 F (36.7 C)  Heart Rate:  [53-76] 57  Resp Rate:  [7-20] 18  BP: (117-157)/(60-77) 157/73 mmHg      Intake/Output Summary (Last 24 hours) at 08/19/15 0837  Last data filed at 08/19/15 0800   Gross per 24 hour   Intake   2590 ml   Output   4550 ml   Net  -1960 ml    Physical Exam  General: awake, alert X 3  Cardiovascular: irregularly irregular,  no murmurs, rubs or gallops  Lungs: clear to auscultation bilaterally, without wheezing, rhonchi, or rales  Abdomen: soft, non-tender, non-distended; no palpable masses,  normoactive bowel sounds  Extremities: no edema  Neuro: CN grossly intact, speech fluent, follows commands, 4/5 b/l UE and LE strength        Meds:     Medications were reviewed:    Labs:     Labs (last 72 hours):      Recent Labs  Lab 08/19/15  0056 08/18/15  0440   WBC 9.52 8.89   HGB 13.5 13.2   HEMATOCRIT 40.4* 39.1*   PLATELETS 242 231         Recent Labs  Lab 08/17/15  1721   PT 13.8   PT INR 1.1   PTT 24      Recent Labs  Lab 08/19/15  0056 08/18/15  0440   SODIUM 136 134*   POTASSIUM 4.7 4.3   CHLORIDE 101 100   CO2 27 28   BUN 15.0 21.0   CREATININE 0.6* 0.6*   CALCIUM 8.4  8.6   ALBUMIN 2.4* 2.5*   PROTEIN, TOTAL 5.5* 5.4*   BILIRUBIN, TOTAL 0.6 0.5   ALKALINE PHOSPHATASE 64 61   ALT 49 56*   AST (SGOT) 34 38*   GLUCOSE 123* 123*                   Microbiology, reviewed and are significant for:  Microbiology Results     Procedure Component Value Units Date/Time    MRSA culture [[id number]] Collected:  08/17/15 2043    Specimen Information:  Body Fluid from Nares and Throat Updated:  08/18/15 0303            Imaging, reviewed and are significant for:  No results found.        Signed by: Isaias Cowman, MD

## 2015-08-19 NOTE — Progress Notes (Signed)
Neurosurgery Progress Note  HD 3  S:   Chief neuro complaint remains hand weakness    O:   Filed Vitals:    08/19/15 0800   BP: 157/73   Pulse: 57   Temp: 98 F (36.7 C)   Resp: 18   SpO2: 97%         PHYSICAL EXAM:  Awake, alert  Oriented  Speech is fluent  B UE's 4+/5 except for grip 4-/5  B LE 4+/5      Spinal angiogram did not reveal any obvious arteriovenous abnormalities      A/P:   Neuro stable  Discussed angiogram findings with Ernest Diaz and his son.  We will proceed with surgical exploration tomorrow of the area below the initial decompression in the cervical spine given that the C6-7 area showed the most prominent signal changes posterior to the cord. We will order a new cervical MRI for preoperative planning.     NPO after MN      Rella Larve, MD

## 2015-08-19 NOTE — Plan of Care (Signed)
Problem: Safety  Goal: Patient will be free from injury during hospitalization  Outcome: Progressing  Q2H neuro checks, no changes noted. MRI ordered to visualize area requiring surgery tomorrow (scheduled for 12:15pm per pt's son). Assisted pt with eating as needed. RN asked about removing foley catheter, MD stated catheter should remain in place for surgery tomorrow. No prn pain medication given. VSS, pt afebrile. Continue with plan of care.

## 2015-08-19 NOTE — Progress Notes (Signed)
Late Entry from 3/8 @ 1230    CCM attempted to speak with Dr. Jaynie Collins to try to predict if patient might improve from his current quadriplegia after the surgery on 3/9 to decide whether the patient should be applied for Medicaid    Patient does have Medicare A & B policy and a secondary insurance therefore CCM will hold on Medicaid application @ this time.     If the patient's post op course reveals long term care needs then will consider applying for Medicaid    Patient is schedule for cervical exploration on 3/9 with Dr/ Ova Freshwater RN, MSN  Clinical Case Manager   NT4 [facility]   Email: [email or web address]  Spectralink: Monday-Friday [phone] on Weekends call x [phone] Thank You

## 2015-08-19 NOTE — Plan of Care (Addendum)
Please see flowsheets for vitals and trends.  Neuro status unchanged from previous shifts. Gave PRN tylenol twice and oxycodone twice for right shoulder pain with good results.  Pt still in A-fib with frequent PVCs.  HR low 40s - 70.  BP w/in goal.  Pt breathing easily on RA.  No BM this shift.  AUO to foley.  No hematoma or pain to groin incision site.  Dressing clean, dry, and intact.  Pt updated and support given.

## 2015-08-20 ENCOUNTER — Encounter
Admission: AD | Disposition: A | Discharge status: Other Acute Inpatient Hospital | Payer: Self-pay | Source: Other Acute Inpatient Hospital | Attending: Internal Medicine

## 2015-08-20 ENCOUNTER — Inpatient Hospital Stay: Payer: Medicare Other | Admitting: Anesthesiology

## 2015-08-20 ENCOUNTER — Inpatient Hospital Stay: Payer: Medicare Other

## 2015-08-20 ENCOUNTER — Ambulatory Visit: Payer: Self-pay

## 2015-08-20 HISTORY — PX: DECOMPRESSION, POSTERIOR CERVICAL, FUSION, LEVELS 2: SHX3731

## 2015-08-20 HISTORY — PX: DECOMPRESSION, ANTERIOR CERVICAL, FUSION, LEVELS 2: SHX3714

## 2015-08-20 LAB — CBC AND DIFFERENTIAL
Basophils Absolute Automated: 0.02 10*3/uL (ref 0.00–0.20)
Basophils Automated: 0 %
Eosinophils Absolute Automated: 0.07 10*3/uL (ref 0.00–0.70)
Eosinophils Automated: 1 %
Hematocrit: 40.3 % — ABNORMAL LOW (ref 42.0–52.0)
Hgb: 13.5 g/dL (ref 13.0–17.0)
Immature Granulocytes Absolute: 0.18 10*3/uL — ABNORMAL HIGH
Immature Granulocytes: 2 %
Lymphocytes Absolute Automated: 1.64 10*3/uL (ref 0.50–4.40)
Lymphocytes Automated: 14 %
MCH: 32.9 pg — ABNORMAL HIGH (ref 28.0–32.0)
MCHC: 33.5 g/dL (ref 32.0–36.0)
MCV: 98.3 fL (ref 80.0–100.0)
MPV: 10.4 fL (ref 9.4–12.3)
Monocytes Absolute Automated: 1.19 10*3/uL (ref 0.00–1.20)
Monocytes: 10 %
Neutrophils Absolute: 8.99 10*3/uL — ABNORMAL HIGH (ref 1.80–8.10)
Neutrophils: 74 %
Nucleated RBC: 0 /100 WBC (ref 0–1)
Platelets: 210 10*3/uL (ref 140–400)
RBC: 4.1 10*6/uL — ABNORMAL LOW (ref 4.70–6.00)
RDW: 14 % (ref 12–15)
WBC: 12.09 10*3/uL — ABNORMAL HIGH (ref 3.50–10.80)

## 2015-08-20 LAB — CVOR1 AGNKC GL THGB CLWBL
Arterial Total CO2: 28.5 mEq/L (ref 24.0–30.0)
Base Excess, Arterial: 3.8 mEq/L — ABNORMAL HIGH (ref <=2.0)
Calcium, Ionized: 2.15 mEq/L — ABNORMAL LOW (ref 2.30–2.58)
Chloride, WB: 101 mEq/L (ref 98–106)
HCO3, Arterial: 27.3 mEq/L (ref 23.0–29.0)
Hematocrit Total Calculated: 35 % — ABNORMAL LOW (ref 40.0–54.0)
Hemoglobin Total: 11.3 g/dL — ABNORMAL LOW (ref 13.0–17.0)
O2 Sat, Arterial: 100 % (ref 95.0–100.0)
Temperature: 37
Whole Blood Glucose: 122 mg/dL — ABNORMAL HIGH (ref 70–100)
Whole Blood Potassium: 3.8 mEq/L (ref 3.5–5.3)
Whole Blood Sodium: 135 mEq/L — ABNORMAL LOW (ref 136–146)
pCO2, Arterial: 38.9 mmhg (ref 35.0–45.0)
pH, Arterial: 7.461 — ABNORMAL HIGH (ref 7.350–7.450)
pO2, Arterial: 168 mmhg — ABNORMAL HIGH (ref 80.0–90.0)

## 2015-08-20 LAB — PT/INR
PT INR: 1.1 (ref 0.9–1.1)
PT: 14.2 s (ref 12.6–15.0)

## 2015-08-20 LAB — COMPREHENSIVE METABOLIC PANEL
ALT: 61 U/L — ABNORMAL HIGH (ref 0–55)
AST (SGOT): 36 U/L — ABNORMAL HIGH (ref 5–34)
Albumin/Globulin Ratio: 0.9 (ref 0.9–2.2)
Albumin: 2.4 g/dL — ABNORMAL LOW (ref 3.5–5.0)
Alkaline Phosphatase: 66 U/L (ref 38–106)
BUN: 23 mg/dL (ref 9.0–28.0)
Bilirubin, Total: 0.5 mg/dL (ref 0.2–1.2)
CO2: 26 mEq/L (ref 22–29)
Calcium: 8.3 mg/dL (ref 7.9–10.2)
Chloride: 100 mEq/L (ref 100–111)
Creatinine: 0.6 mg/dL — ABNORMAL LOW (ref 0.7–1.3)
Globulin: 2.8 g/dL (ref 2.0–3.6)
Glucose: 113 mg/dL — ABNORMAL HIGH (ref 70–100)
Potassium: 3.9 mEq/L (ref 3.5–5.1)
Protein, Total: 5.2 g/dL — ABNORMAL LOW (ref 6.0–8.3)
Sodium: 135 mEq/L — ABNORMAL LOW (ref 136–145)

## 2015-08-20 LAB — TYPE AND SCREEN
AB Screen Gel: NEGATIVE
ABO Rh: A NEG

## 2015-08-20 LAB — GFR: EGFR: >60

## 2015-08-20 SURGERY — DECOMPRESSION, ANTERIOR CERVICAL, FUSION, LEVELS 2
Anesthesia: Anesthesia General | Site: Spine Cervical | Wound class: Clean

## 2015-08-20 MED ORDER — LIDOCAINE-EPINEPHRINE 1 %-1:100000 IJ SOLN
INTRAMUSCULAR | Status: DC | PRN
Start: 2015-08-20 — End: 2015-08-20
  Administered 2015-08-20: 4 mL
  Administered 2015-08-20: 1 mL

## 2015-08-20 MED ORDER — FENTANYL CITRATE (PF) 50 MCG/ML IJ SOLN (WRAP)
INTRAMUSCULAR | Status: AC
Start: 2015-08-20 — End: ?
  Filled 2015-08-20: qty 2

## 2015-08-20 MED ORDER — LACTATED RINGERS IV SOLN
INTRAVENOUS | Status: DC
Start: 2015-08-20 — End: 2015-08-20

## 2015-08-20 MED ORDER — FAMOTIDINE 10 MG/ML IV SOLN (WRAP)
INTRAVENOUS | Status: DC | PRN
Start: 2015-08-20 — End: 2015-08-20
  Administered 2015-08-20: 20 mg via INTRAVENOUS

## 2015-08-20 MED ORDER — GLYCOPYRROLATE 0.2 MG/ML IJ SOLN
INTRAMUSCULAR | Status: AC
Start: 2015-08-20 — End: ?
  Filled 2015-08-20: qty 1

## 2015-08-20 MED ORDER — HYDROMORPHONE HCL 1 MG/ML IJ SOLN
0.5000 mg | INTRAMUSCULAR | Status: DC | PRN
Start: 2015-08-20 — End: 2015-08-20

## 2015-08-20 MED ORDER — CALCIUM CHLORIDE 10 % IV SOLN
INTRAVENOUS | Status: DC | PRN
Start: 2015-08-20 — End: 2015-08-20
  Administered 2015-08-20 (×2): 250 mg via INTRAVENOUS

## 2015-08-20 MED ORDER — HYDROMORPHONE HCL 1 MG/ML IJ SOLN
0.4000 mg | Freq: Once | INTRAMUSCULAR | Status: DC
Start: 2015-08-20 — End: 2015-08-20

## 2015-08-20 MED ORDER — SENNOSIDES-DOCUSATE SODIUM 8.6-50 MG PO TABS
1.0000 | ORAL_TABLET | Freq: Two times a day (BID) | ORAL | Status: DC
Start: 2015-08-20 — End: 2015-08-31
  Administered 2015-08-20 – 2015-08-31 (×20): 1 via ORAL
  Filled 2015-08-20 (×16): qty 1

## 2015-08-20 MED ORDER — PROPOFOL 10 MG/ML IV EMUL (WRAP)
INTRAVENOUS | Status: DC | PRN
Start: 2015-08-20 — End: 2015-08-20
  Administered 2015-08-20: 200 mg via INTRAVENOUS

## 2015-08-20 MED ORDER — GLYCOPYRROLATE 0.2 MG/ML IJ SOLN
INTRAMUSCULAR | Status: DC | PRN
Start: 2015-08-20 — End: 2015-08-20
  Administered 2015-08-20: 0.1 mg via INTRAVENOUS

## 2015-08-20 MED ORDER — CEFAZOLIN SODIUM-DEXTROSE 2-3 GM-% IV SOLR
INTRAVENOUS | Status: AC
Start: 2015-08-20 — End: ?
  Filled 2015-08-20: qty 50

## 2015-08-20 MED ORDER — BACITRACIN 50000 UNITS IM SOLR
INTRAMUSCULAR | Status: DC | PRN
Start: 2015-08-20 — End: 2015-08-20
  Administered 2015-08-20: 50000 [IU]

## 2015-08-20 MED ORDER — FENTANYL CITRATE (PF) 50 MCG/ML IJ SOLN (WRAP)
25.0000 ug | INTRAMUSCULAR | Status: AC | PRN
Start: 2015-08-20 — End: 2015-08-20
  Administered 2015-08-20 (×3): 25 ug via INTRAVENOUS

## 2015-08-20 MED ORDER — ALBUMIN HUMAN 5 % IV SOLN
INTRAVENOUS | Status: DC | PRN
Start: 2015-08-20 — End: 2015-08-20

## 2015-08-20 MED ORDER — PROMETHAZINE HCL 25 MG/ML IJ SOLN
6.2500 mg | Freq: Once | INTRAMUSCULAR | Status: DC | PRN
Start: 2015-08-20 — End: 2015-08-20

## 2015-08-20 MED ORDER — THROMBIN 5000 UNITS EX SOLR
CUTANEOUS | Status: DC | PRN
Start: 2015-08-20 — End: 2015-08-20
  Administered 2015-08-20: 15000 [IU] via TOPICAL

## 2015-08-20 MED ORDER — LIDOCAINE HCL (PF) 2 % IJ SOLN
INTRAMUSCULAR | Status: AC
Start: 2015-08-20 — End: ?
  Filled 2015-08-20: qty 5

## 2015-08-20 MED ORDER — PHENYLEPHRINE HCL 10 MG/ML IV SOLN (WRAP)
Status: DC | PRN
Start: 2015-08-20 — End: 2015-08-20
  Administered 2015-08-20 (×4): 100 ug via INTRAVENOUS

## 2015-08-20 MED ORDER — ONDANSETRON HCL 4 MG/2ML IJ SOLN
INTRAMUSCULAR | Status: DC | PRN
Start: 2015-08-20 — End: 2015-08-20
  Administered 2015-08-20 (×2): 4 mg via INTRAVENOUS

## 2015-08-20 MED ORDER — CEFAZOLIN SODIUM-DEXTROSE 2-3 GM-% IV SOLR
2.0000 g | Freq: Three times a day (TID) | INTRAVENOUS | Status: AC
Start: 2015-08-20 — End: 2015-08-21
  Administered 2015-08-21 (×2): 2 g via INTRAVENOUS
  Filled 2015-08-20 (×2): qty 50

## 2015-08-20 MED ORDER — ONDANSETRON HCL 4 MG/2ML IJ SOLN
4.0000 mg | Freq: Once | INTRAMUSCULAR | Status: DC | PRN
Start: 2015-08-20 — End: 2015-08-20

## 2015-08-20 MED ORDER — DEXAMETHASONE SODIUM PHOSPHATE 20 MG/5ML IJ SOLN
INTRAMUSCULAR | Status: AC
Start: 2015-08-20 — End: ?
  Filled 2015-08-20: qty 5

## 2015-08-20 MED ORDER — FENTANYL CITRATE (PF) 50 MCG/ML IJ SOLN (WRAP)
INTRAMUSCULAR | Status: DC | PRN
Start: 2015-08-20 — End: 2015-08-20
  Administered 2015-08-20 (×2): 25 ug via INTRAVENOUS
  Administered 2015-08-20: 75 ug via INTRAVENOUS
  Administered 2015-08-20: 25 ug via INTRAVENOUS
  Administered 2015-08-20: 50 ug via INTRAVENOUS

## 2015-08-20 MED ORDER — MEPERIDINE HCL 25 MG/ML IJ SOLN
12.5000 mg | Freq: Once | INTRAMUSCULAR | Status: DC | PRN
Start: 2015-08-20 — End: 2015-08-20

## 2015-08-20 MED ORDER — LACTATED RINGERS IV SOLN
INTRAVENOUS | Status: DC | PRN
Start: 2015-08-20 — End: 2015-08-20

## 2015-08-20 MED ORDER — FENTANYL CITRATE (PF) 50 MCG/ML IJ SOLN (WRAP)
INTRAMUSCULAR | Status: AC
Start: 2015-08-20 — End: 2015-08-20
  Administered 2015-08-20: 25 ug via INTRAVENOUS
  Filled 2015-08-20: qty 2

## 2015-08-20 MED ORDER — ROCURONIUM BROMIDE 10 MG/ML IV SOLN (WRAP)
INTRAVENOUS | Status: DC | PRN
Start: 2015-08-20 — End: 2015-08-20
  Administered 2015-08-20: 50 mg via INTRAVENOUS

## 2015-08-20 MED ORDER — PHENYLEPHRINE 100 MCG/ML IN NACL 0.9% IV SOSY
PREFILLED_SYRINGE | INTRAVENOUS | Status: AC
Start: 2015-08-20 — End: ?
  Filled 2015-08-20: qty 5

## 2015-08-20 MED ORDER — SODIUM CHLORIDE 0.9 % IV SOLN
INTRAVENOUS | Status: DC
Start: 2015-08-20 — End: 2015-08-21

## 2015-08-20 MED ORDER — EPHEDRINE SULFATE 50 MG/ML IJ SOLN
INTRAMUSCULAR | Status: DC | PRN
Start: 2015-08-20 — End: 2015-08-20
  Administered 2015-08-20: 5 mg via INTRAVENOUS
  Administered 2015-08-20: 10 mg via INTRAVENOUS

## 2015-08-20 MED ORDER — DIPHENHYDRAMINE HCL 50 MG/ML IJ SOLN
6.2500 mg | Freq: Once | INTRAMUSCULAR | Status: DC | PRN
Start: 2015-08-20 — End: 2015-08-20

## 2015-08-20 MED ORDER — LABETALOL HCL 5 MG/ML IV SOLN
10.0000 mg | INTRAVENOUS | Status: DC | PRN
Start: 2015-08-20 — End: 2015-08-20

## 2015-08-20 MED ORDER — PROPOFOL 10 MG/ML IV EMUL (WRAP)
INTRAVENOUS | Status: AC
Start: 2015-08-20 — End: ?
  Filled 2015-08-20: qty 20

## 2015-08-20 MED ORDER — LACTATED RINGERS IV SOLN
75.0000 mL/h | INTRAVENOUS | Status: DC
Start: 2015-08-20 — End: 2015-08-20

## 2015-08-20 MED ORDER — EPHEDRINE SULFATE 50 MG/ML IJ/IV SOLN (WRAP)
Status: AC
Start: 2015-08-20 — End: ?
  Filled 2015-08-20: qty 1

## 2015-08-20 MED ORDER — BENZOCAINE-MENTHOL 15-3.6 MG MT LOZG
1.0000 | LOZENGE | OROMUCOSAL | Status: DC | PRN
Start: 2015-08-20 — End: 2015-09-09
  Administered 2015-08-21 – 2015-08-23 (×2): 1 via BUCCAL
  Filled 2015-08-20 (×5): qty 1

## 2015-08-20 MED ORDER — HYDROMORPHONE HCL 1 MG/ML IJ SOLN
0.4000 mg | Freq: Once | INTRAMUSCULAR | Status: AC | PRN
Start: 2015-08-20 — End: 2015-08-20
  Administered 2015-08-20: 0.4 mg via INTRAVENOUS
  Filled 2015-08-20: qty 1

## 2015-08-20 MED ORDER — ROCURONIUM BROMIDE 10 MG/ML IV SOLN (WRAP)
INTRAVENOUS | Status: AC
Start: 2015-08-20 — End: ?
  Filled 2015-08-20: qty 5

## 2015-08-20 MED ORDER — MIDAZOLAM HCL 2 MG/2ML IJ SOLN
INTRAMUSCULAR | Status: DC | PRN
Start: 2015-08-20 — End: 2015-08-20
  Administered 2015-08-20 (×2): 1 mg via INTRAVENOUS

## 2015-08-20 MED ORDER — DEXAMETHASONE SODIUM PHOSPHATE 4 MG/ML IJ SOLN (WRAP)
INTRAMUSCULAR | Status: DC | PRN
Start: 2015-08-20 — End: 2015-08-20
  Administered 2015-08-20: 10 mg via INTRAVENOUS

## 2015-08-20 MED ORDER — GELATIN ABSORBABLE 100 EX MISC
CUTANEOUS | Status: DC | PRN
Start: 2015-08-20 — End: 2015-08-20
  Administered 2015-08-20: 1 via TOPICAL

## 2015-08-20 MED ORDER — FAMOTIDINE 20 MG/2ML IV SOLN
INTRAVENOUS | Status: AC
Start: 2015-08-20 — End: ?
  Filled 2015-08-20: qty 2

## 2015-08-20 MED ORDER — LUBRIFRESH P.M. OP OINT
TOPICAL_OINTMENT | OPHTHALMIC | Status: AC
Start: 2015-08-20 — End: ?
  Filled 2015-08-20: qty 3.5

## 2015-08-20 MED ORDER — HYDRALAZINE HCL 20 MG/ML IJ SOLN
10.0000 mg | INTRAMUSCULAR | Status: DC | PRN
Start: 2015-08-20 — End: 2015-08-20

## 2015-08-20 MED ORDER — SODIUM CHLORIDE 0.9% BAG (IRRIGATION USE)
INTRAVENOUS | Status: DC | PRN
Start: 2015-08-20 — End: 2015-08-20
  Administered 2015-08-20 (×3): 1000 mL

## 2015-08-20 MED ORDER — PHENYLEPHRINE HCL 10 MG/ML IV SOLN (WRAP)
100.0000 mg | Status: DC | PRN
Start: 2015-08-20 — End: 2015-08-20
  Administered 2015-08-20: 20 ug/min via INTRAVENOUS

## 2015-08-20 MED ORDER — MIDAZOLAM HCL 2 MG/2ML IJ SOLN
INTRAMUSCULAR | Status: AC
Start: 2015-08-20 — End: ?
  Filled 2015-08-20: qty 2

## 2015-08-20 MED ORDER — LIDOCAINE HCL 2 % IJ SOLN
INTRAMUSCULAR | Status: DC | PRN
Start: 2015-08-20 — End: 2015-08-20
  Administered 2015-08-20: 50 mg
  Administered 2015-08-20: 70 mg
  Administered 2015-08-20: 30 mg
  Administered 2015-08-20: 100 mg

## 2015-08-20 MED ORDER — MICROFIBRILLAR COLL HEMOSTAT EX POWD
CUTANEOUS | Status: DC | PRN
Start: 2015-08-20 — End: 2015-08-20
  Administered 2015-08-20: 1 g via TOPICAL

## 2015-08-20 MED ORDER — METHOCARBAMOL 500 MG PO TABS
750.0000 mg | ORAL_TABLET | Freq: Three times a day (TID) | ORAL | Status: DC | PRN
Start: 2015-08-20 — End: 2015-09-20
  Administered 2015-08-21 – 2015-08-26 (×7): 750 mg via ORAL
  Filled 2015-08-20 (×7): qty 2

## 2015-08-20 MED ORDER — ONDANSETRON HCL 4 MG/2ML IJ SOLN
INTRAMUSCULAR | Status: AC
Start: 2015-08-20 — End: ?
  Filled 2015-08-20: qty 2

## 2015-08-20 SURGICAL SUPPLY — 70 items
BLADE S/SU RIBBACK CARB STL 11 (Blade) ×3 IMPLANT
CAP LOCKING ELLIPSE (Cap) ×12 IMPLANT
CATHETER IV JELCO 14GA 2IN STRL RADOPQ (IV Supply) ×1
CATHETER IV OD14 GA L2 IN RADIOPAQUE (IV Supply) ×2
CATHETER IV OD14 GA L2 IN RADIOPAQUE JELCO (IV Supply) ×2 IMPLANT
CONTAINER SPECIMAN STERILE 5OZ (Procedure Accessories) ×3 IMPLANT
DRAPE 3/4 SHEET FANFLD 52X76IN (Drape) ×6 IMPLANT
DRAPE MAG FM DVN 20X16IN LF STRL HNDFR (Drape) ×1
DRAPE MAGNETIC HAND FREE TRANSFER (Drape) ×2 IMPLANT
DRAPE PEDS W/ ARMBOARD COVERS (Drape) ×3 IMPLANT
DRESSING FLEXZAN 4X4 (Dressing) ×6 IMPLANT
DRESSING ISLAND TELFA 4X10 (Dressing) ×3 IMPLANT
DRESSING SCR TGDRM 4.5X3.5IN LF STRL FLM (Dressing) ×1
DRESSING SECUREMENT TEGADERM L4 1/2 IN X (Dressing) ×2
DRESSING SECUREMENT TEGADERM L4 1/2 IN X W3 1/2 IN INTRAVENOUS FILM (Dressing) ×2 IMPLANT
DRESSING TELFA 3X8IN STERILE (Dressing) ×3 IMPLANT
DRESSING TRANSPARENT L4 3/4 IN X W4 IN (Dressing) ×2
DRESSING TRANSPARENT L4 3/4 IN X W4 IN POLYURETHANE ADHESIVE (Dressing) ×2 IMPLANT
DRESSING TRNS PU STD TGDRM 4.75X4IN LF (Dressing) ×1
ELECTRODE BLADE (Cautery) ×3 IMPLANT
ELECTRODE ELECTROSURGICAL BLADE L4 IN (Cautery) ×2
ELECTRODE ELECTROSURGICAL BLADE L4 IN OD3/32 IN EDGE L.2 IN INSULATE (Cautery) ×2 IMPLANT
ELECTRODE ESURG BLDE EDG 3/32IN 4IN LF (Cautery) ×1
END CAP SPINAL LUMBAR ELLIPSE 2.7X8MM TITANIUM 182500 (Cap) ×8 IMPLANT
GLOVE BIOGEL SURG PF SZ 8.5 (Glove) ×3 IMPLANT
MARKER SKIN 2IN1 W/T-O SLEEVE (Procedure Accessories) ×3 IMPLANT
MASTISOL VIAL 2/3CC STRL (Skin Closure) ×3 IMPLANT
NEEDLE REG BEVEL 19GX1.5IN (Needles) ×9 IMPLANT
NEEDLE SPINAL DISP 18GX3.5IN (Needles) ×3 IMPLANT
PACK SPNE FFX (Pack) ×3 IMPLANT
PAD ELECTROSRG GRND REM W CRD (Procedure Accessories) ×3 IMPLANT
PIN DISTRACTION STRL 14MM (Procedure Accessories) IMPLANT
ROD ELLIPSE 3.5X80MM (Rods) ×6 IMPLANT
SCREW BN COALITION 3.6MM 14MM NS SLF DRL (Screw) ×2 IMPLANT
SCREW BONE L10 MM OD3.5 MM TITANIUM SPINE POLYAXIAL ELLIPSE (Screw) ×8 IMPLANT
SCREW DSTRCTN 12MM (Other) IMPLANT
SCREW ELLIPSE PLYAXL 3.5X10MM (Screw) ×12 IMPLANT
SCREW L14 MM OD3.6 MM SPINE SELF DRILL (Screw) ×4 IMPLANT
SCREW L14 MM OD3.6 MM SPINE SELF DRILL VARIABLE ANGLE BONE COALITION (Screw) ×4 IMPLANT
SEALER ELECTROSURGICAL L.226 IN 30 D (Cautery) ×2
SEALER ELECTROSURGICAL L.226 IN 30 D .173 IN SPACE BIPOLAR 2 ELECTRODE (Cautery) ×2 IMPLANT
SEALER ESURG 30D .173IN SPC AQUAMANTYS (Cautery) ×1
SET IRR CDMN LF NS CORD TUBE FLY LD ROT (Tubing) ×1
SET IRRIGATION CODMAN CORD TUBE FLY LEAD (Tubing) ×2
SET IRRIGATION CODMAN CORD TUBE FLY LEAD ROTARY PUMP (Tubing) ×2 IMPLANT
SOLUTION IRR 0.9% NACL 1000ML LF STRL (Irrigation Solutions) ×1
SOLUTION IRRIGATION 0.9% SODIUM CHLORIDE (Irrigation Solutions) ×2
SOLUTION IRRIGATION 0.9% SODIUM CHLORIDE 1000 ML PLASTIC POUR BOTTLE (Irrigation Solutions) ×2 IMPLANT
SOLUTION SRGPRP 74% ISPRP 0.7% IOD (Prep) ×1
SOLUTION SURGICAL PREP 26 ML DURAPREP (Prep) ×2
SOLUTION SURGICAL PREP 26 ML DURAPREP 74% ISOPROPYL ALCOHOL 0.7% (Prep) ×2 IMPLANT
SPACER COALITION 14X16X10MM (Spacer) ×3 IMPLANT
STRIP SKIN CLOSURE L4 IN X W1/2 IN (Dressing) ×2
STRIP SKIN CLOSURE L4 IN X W1/2 IN REINFORCE STERI-STRIP POLYESTER (Dressing) ×2 IMPLANT
STRIP SKNCLS PLSTR STRSTRP 4X.5IN LF (Dressing) ×1
SUTURE VICRYL 3-0 CP2 8X18IN (Suture) ×6 IMPLANT
SUTURE VICRYL 3-0 SH 8X18IN (Suture) ×3 IMPLANT
SUTURE VICRYL D-SPECIAL 3-0 (Suture) ×3 IMPLANT
SYRINGE 20 ML BD LUER-LOK MEDICAL (Syringes, Needles) ×2 IMPLANT
SYRINGE LUER LOCK 10CC (Syringes, Needles) ×9 IMPLANT
SYRINGE MED 20ML LL LF STRL (Syringes, Needles) ×3
TAPE SURGICAL DURAPORE 3IN (Tape) ×3 IMPLANT
TOOL DISSECTING L14 CM MATCH HEAD FLUTE (Burr) ×2
TOOL DISSECTING L14 CM MATCH HEAD FLUTE OD3 MM MIDAS REX LEGEND (Burr) ×2 IMPLANT
TOOL DISSECTING L9 CM MATCH HEAD FLUTE (Burr) ×2
TOOL DISSECTING L9 CM MATCH HEAD FLUTE OD3 MM MIDAS REX LEGEND (Burr) ×2 IMPLANT
TOOL DSCT LGND 3MM 14MM (Burr) ×1
TOOL DSCT MTCH HD LGND 3MM 9CM (Burr) ×1
TRAY SSTEP CATH UMETER 16FR (Tray) IMPLANT
TUBING SUCTION 3/16X10 (Tubing) ×6 IMPLANT

## 2015-08-20 NOTE — Anesthesia Postprocedure Evaluation (Signed)
Anesthesia Post Evaluation    Patient: Ernest Diaz    Procedure(s) with comments:  DECOMPRESSION, ANTERIOR CERVICAL, FUSION, LEVELS 2 - C5-C7 ACDF; AVM REMOVAL  DECOMPRESSION, POSTERIOR CERVICAL, FUSION, LEVELS 2    Anesthesia type: general    Last Vitals:   Filed Vitals:    08/20/15 2050   BP: 145/74   Pulse: 89   Temp: 36.2 C (97.2 F)   Resp: 16   SpO2: 98%       Patient Location: Phase I PACU      Post Pain: Patient not complaining of pain, continue current therapy    Mental Status: awake    Respiratory Function: tolerating face mask    Cardiovascular: stable    Nausea/Vomiting: patient not complaining of nausea or vomiting    Hydration Status: adequate    Post Assessment: no apparent anesthetic complications, no reportable events and no evidence of recall          Anesthesia Qualified Clinical Data Registry    Central Line      CVC insertion : NO                                               Perioperative temperature management      General/neuraxial anesthesia > or = 60 minutes (excluding CABG) : YES              > Use of intraoperative active warming : YES              > Temperature > or = 36 degrees Centigrade (96.8 degrees Farenheit) during time span from 30 minutes before up to 15 minutes after anesthesia end time : YES      Administration of antibiotic prophylaxis      Age > or = 18, with IV access, with surgical procedure for which antibiotic prophylaxis indicated, and not on chronic antibiotics : YES              > Prophylactic antibiotics within 1 hour of incision (or fluroroquinolone/vancomycin within 2 hours of incision) : YES    Medication Administration      Ordering or administration of drug inconsistent with intended drug, dose, delivery or timing : NO      Dental loss/damage      Dental injury with administration of anesthesia : NO      Difficult intubation due to unrecognized difficult airway        Elective airway procedure including but not limited to: tracheostomy,  fiberoptic bronchoscopy, rigid bronchoscopy; jet ventilation; or elective use of a device to facilitate airway management such as a Glidescope : NO                > Unanticipated difficult intubation post pre-evaluation : NO      Aspiration of gastric contents        Aspiration of gastric contents : NO                    Surgical fire        Procedure requiring electrocautery/laser : YES                > Ignition/burning in invasive procedure location : NO      Immediate perioperative cardiac arrest        Cardiac arrest in OR or PACU :  NO                    Unplanned hospital admission        Unplanned hospital admission for initially intended outpatient anesthesia service : NO      Unplanned ICU admission        Unplanned ICU admission related to anesthesia occurring within 24 hours of induction or start of MAC : NO      Surgical case cancellation        Cancellation of procedure after care already started by anesthesia care team : NO      Post-anesthesia transfer of care checklist/protocol to PACU        Transfer from OR to PACU upon case conclusion : YES              > Use of PACU transfer checklist/protocol : YES     (Includes the key elements of: patient identification, responsible practitioner identification (PACU nurse or advanced practitioner), discussion of pertinent history and procedure course, intraoperative anesthetic management, post-procedure plans, acknowledgement/questions)    Post-anesthesia transfer of care checklist/protocol to ICU        Transfer from OR to ICU upon case conclusion : NO                    Post-operative nausea/vomiting risk protocol        Post-operative nausea/vomiting risk protocol : YES  Patient > or = 18 with care initiated by anesthesia team that has a risk factor screen for post-op nausea/vomiting (Includes male, hx PONV, or motion sickness, non-smoker, intended opioid administration for post-op analgesia.)    Anaphylaxis        Anaphylaxis during anesthesia services :  NO    (Inclusive of any suspected transfusion reaction in association with blood-bank confirmed blood product incompatibility)              Tessa Lerner, 08/20/2015 9:05 PM

## 2015-08-20 NOTE — Progress Notes (Signed)
MEDICINE PROGRESS NOTE    Date Time: 08/20/2015 8:37 AM  Patient Name: Ernest Diaz  Attending Physician: Isaias Cowman, MD    Assessment:   Principal Problem:    Thoracic spinal AV fistula  Active Problems:    Quadriplegia    Atrial fibrillation  Resolved Problems:    * No resolved hospital problems. *      Plan:   1. Traumatic cervicothoracic epidural hematoma s/p C3-C5 + T3-T4 decompressive laminectomies 2/27 by Dr. 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 Jaynie Collins, per Neurosurgery recommendations no further imaging or repeat CT C-spine indicated at this time given patient already has imported CT C-spine imaging, plan for surgical intervention today     - Neuro checks q 2 h  - Hold AC given procedure  - Hydrated with IVF overnight given NPO status     2. Hx A Fib on Eliquis, CHADS2VASC of 2  -Rate controlled on Atenolol, bradycardia improved after decreasing dose to 25 mg q day   -Patient asymptomatic with appropriate chronotropic response  -Hold AC and antiplatelets given procedure at this time     3. HTN - No acute issues  - Continue Norvasc  - Decreased Atenolol as above   - Continue Maxzide   - Goal < 150/90 as outpatient     DVT/GI prophylaxis - SCDs, no DVT chemical ppx until cleared by NSGY     Case discussed with: Patient, RN, NSGY    Safety Checklist:     DVT prophylaxis:  CHEST guideline (See page e199S) Mechanical   Foley:  [place] Rn Foley protocol Not present   IVs:  Peripheral IV   PT/OT: Ordered   Daily CBC & or Chem ordered:  SHM/ABIM guidelines (see #5) Yes, due to clinical and lab instability   Reference for approximate charges of common labs: CBC auto diff - $76  BMP - $99  Mg - $79    Lines:     Patient Lines/Drains/Airways  Status    Active PICC Line / CVC Line / PIV Line / Drain / Airway / Intraosseous Line / Epidural Line / ART Line / Line / Wound / Pressure Ulcer / NG/OG Tube     Name:   Placement date:   Placement time:   Site:   Days:    Peripheral IV Right Antecubital        Antecubital       Peripheral IV 08/17/15 Left;Lateral Antecubital  08/17/15   1723   Antecubital   less than 1    Urethral Catheter               Incision Site 08/17/15 Back Upper  08/17/15   1721     less than 1                 Disposition: (Please see PAF column for Expected D/C Date)   Today's date: 08/20/2015  Admit Date: 08/17/2015  4:29 PM  LOS: 3  Clinical Milestones: Neurosurgical evaluation   Anticipated discharge needs: Per PT/OT recs      Subjective     CC: Arteriovenous fistula of spinal cord vessels    Interval History/24 hour events: No acute events overnight.  Patient reports some neck pain however denies chest pain, shortness of breath, abdominal pain.  Awaiting surgical intervention today.  Denies other acute concerns/complaints.      Review of Systems:     Review of Systems - Negative except as noted above in HPI      Physical Exam:     VITAL SIGNS PHYSICAL EXAM   Temp:  [97.5 F (36.4 C)-97.8 F (36.6 C)] 97.6 F (36.4 C)  Heart Rate:  [52-78] 52  Resp Rate:  [8-22] 8  BP: (106-133)/(56-70) 129/62 mmHg      Intake/Output Summary (Last 24 hours) at 08/20/15 0837  Last data filed at 08/20/15 0600   Gross per 24 hour   Intake 463.33 ml   Output   2525 ml   Net -2061.67 ml    Physical Exam  General: awake, alert X 3  Cardiovascular: irregularly irregular,  no murmurs, rubs or gallops  Lungs: clear to auscultation bilaterally, without wheezing, rhonchi, or rales  Abdomen: soft, non-tender, non-distended; no palpable masses,  normoactive bowel sounds  Extremities: no edema  Neuro: CN grossly intact, speech fluent, follows commands, 4/5 b/l UE and LE strength        Meds:     Medications were reviewed:    Labs:     Labs (last 72  hours):      Recent Labs  Lab 08/20/15  0140 08/19/15  0056   WBC 12.09* 9.52   HGB 13.5 13.5   HEMATOCRIT 40.3* 40.4*   PLATELETS 210 242         Recent Labs  Lab 08/20/15  0140 08/17/15  1721   PT 14.2 13.8   PT INR 1.1 1.1   PTT  --  24      Recent Labs  Lab 08/20/15  0140 08/19/15  0056   SODIUM 135* 136   POTASSIUM 3.9 4.7   CHLORIDE 100 101   CO2 26 27   BUN 23.0 15.0   CREATININE 0.6* 0.6*   CALCIUM 8.3 8.4   ALBUMIN 2.4* 2.4*   PROTEIN, TOTAL 5.2* 5.5*   BILIRUBIN, TOTAL 0.5 0.6   ALKALINE PHOSPHATASE 66 64   ALT 61* 49   AST (SGOT) 36* 34   GLUCOSE 113* 123*                   Microbiology, reviewed and are significant for:  Microbiology Results     Procedure Component Value Units Date/Time    MRSA culture [[id number]] Collected:  08/17/15 2043    Specimen Information:  Body Fluid from Nares and Throat Updated:  08/18/15 0303            Imaging, reviewed and are significant for:  Mri Cervical Spine W Wo Contrast    08/19/2015   Postsurgical change with prior laminectomy. 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 There findings suspicious for a disc ligamentous injury at this level. Correlation with CT is advised. Findings discussed with and acknowledged by Dr. Sheran Luz 10:44  p.m. 08/19/2015 Melody Haver, MD 08/19/2015 10:47 PM           Signed by: Isaias Cowman, MD

## 2015-08-20 NOTE — Anesthesia Preprocedure Evaluation (Addendum)
Anesthesia Evaluation    AIRWAY    Mallampati: III    TM distance: >3 FB  Neck ROM: full  Mouth Opening:full   CARDIOVASCULAR    irregular       DENTAL    no notable dental hx     PULMONARY    pulmonary exam normal and clear to auscultation     OTHER FINDINGS    In Aspen Collar                Anesthesia Plan    ASA 4     general                     intravenous induction   Detailed anesthesia plan: general endotracheal  Monitors/Adjuncts: BIS and arterial line      Post op pain management: per surgeon    informed consent obtained    Plan discussed with CRNA.    ECG reviewed  pertinent labs reviewed  imaging results reviewed

## 2015-08-20 NOTE — H&P (Signed)
H&P Update:  Please see prior note.  No changes from above.  Cardiac - RR  Lungs - +BS on both sides  Please see medicine note for further details      Oshae Simmering Y. Casey Fye, MD

## 2015-08-20 NOTE — Brief Op Note (Signed)
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 Item Serial No. Manufacturer Lot No. LRB No. Used Action   SPACER COALITION [id number] - [id number] Spacer SPACER COALITION [id number]  [employer]  N/A 1 Implanted   SCREW SLFDRL COALITN 3.6X14MM - [id number] Screw SCREW SLFDRL COALITN 3.6X14MM  [employer]  N/A 2 Implanted   SCREW ELLIPSE PLYAXL 3.5X10MM - [id number] Screw SCREW ELLIPSE PLYAXL 3.5X10MM  [employer]  N/A 4 Implanted   ROD ELLIPSE 3.5X80MM - [id number] Rods ROD ELLIPSE 3.5X80MM  [employer]  N/A 2 Implanted   CAP LOCKING ELLIPSE - [id number] Cap CAP LOCKING ELLIPSE   [employer]   N/A 4 Implanted       Drains:   Drains: none    Specimens:        SPECIMENS (last 24 hours)      Pathology Specimens       08/20/15 1800             Specimen Information    Specimen Testing Required Routine Pathology       Specimen ID  [id number]       Specimen Description C7 DURAL AVM           Findings:   See dictation    Complications:   none    Condition:   stable      Signed by: Madaline Savage, MD                                                                              [facility]

## 2015-08-20 NOTE — Progress Notes (Signed)
MRI shows more obvious C6-7 disc injury. Posterior EDH looks improved.    A/P:   Neuro stable  Given MRI findings, we will proceed with C6-7 ACDF and posterior C6-7 lami/fusion.  Rationale for above was discussed in detail with patient and his family.  They agreed with plan.        Rella Larve, MD

## 2015-08-20 NOTE — Plan of Care (Signed)
Neuro checks Q 2 hrs, no changes noted throughout the shift. MRI completed and aspen collar applied. Complained of pain in right shoulder, prn oxycodone 5 mg x 2 and tylenol x 1 with good effect. A.fib, HR 50-70's, SBP 100-120's, afebrile. NPO at midnight. Foley catheter in place, AUOP. Emotional support was given and education with patient and performed. Safety rounds assessed hourly and as needed for pain, positioning, bathroom, and environment.

## 2015-08-20 NOTE — Transfer of Care (Signed)
Anesthesia Transfer of Care Note    Patient: Ernest Diaz    Procedures performed: Procedure(s) with comments:  DECOMPRESSION, ANTERIOR CERVICAL, FUSION, LEVELS 2 - C5-C7 ACDF; AVM REMOVAL  DECOMPRESSION, POSTERIOR CERVICAL, FUSION, LEVELS 2    Anesthesia type: General ETT    Patient location:Phase I PACU    Last vitals:   Filed Vitals:    08/20/15 1958   BP: 160/82   Pulse: 98   Temp:    Resp: 74   SpO2: 99%       Post pain: Patient not complaining of pain, continue current therapy      Mental Status:awake    Respiratory Function: tolerating face mask    Cardiovascular: stable    Nausea/Vomiting: patient not complaining of nausea or vomiting    Hydration Status: adequate    Post assessment: no apparent anesthetic complications and no reportable events    Signed by: Hoover Browns  08/20/2015 7:58 PM

## 2015-08-21 ENCOUNTER — Encounter: Payer: Self-pay | Admitting: Neurological Surgery

## 2015-08-21 MED ORDER — OXYCODONE HCL 5 MG PO TABS
10.0000 mg | ORAL_TABLET | ORAL | Status: DC | PRN
Start: 2015-08-21 — End: 2015-08-22
  Administered 2015-08-21 – 2015-08-22 (×5): 10 mg via ORAL
  Filled 2015-08-21 (×6): qty 2

## 2015-08-21 MED ORDER — FINASTERIDE 5 MG PO TABS
5.0000 mg | ORAL_TABLET | Freq: Every evening | ORAL | Status: DC
Start: 2015-08-21 — End: 2015-08-21

## 2015-08-21 MED ORDER — FINASTERIDE 5 MG PO TABS
5.0000 mg | ORAL_TABLET | Freq: Every evening | ORAL | Status: DC
Start: 2015-08-21 — End: 2015-09-01
  Administered 2015-08-21 – 2015-08-31 (×11): 5 mg via ORAL
  Filled 2015-08-21 (×11): qty 1

## 2015-08-21 MED ORDER — ACETAMINOPHEN 325 MG PO TABS
325.0000 mg | ORAL_TABLET | ORAL | Status: DC | PRN
Start: 2015-08-21 — End: 2015-09-24
  Administered 2015-08-21 – 2015-08-22 (×3): 325 mg via ORAL
  Administered 2015-08-23: 162.5 mg via ORAL
  Administered 2015-08-23 – 2015-09-21 (×20): 325 mg via ORAL
  Filled 2015-08-21 (×27): qty 1

## 2015-08-21 MED ORDER — ENOXAPARIN SODIUM 40 MG/0.4ML SC SOLN
40.0000 mg | Freq: Every day | SUBCUTANEOUS | Status: DC
Start: 2015-08-22 — End: 2015-08-29
  Administered 2015-08-22 – 2015-08-28 (×7): 40 mg via SUBCUTANEOUS
  Filled 2015-08-21 (×7): qty 0.4

## 2015-08-21 NOTE — Progress Notes (Signed)
Neurosurgery Progress Note  POD 1  S:   Arms/hands feel better    O:   Filed Vitals:    08/21/15 1400   BP: 109/55   Pulse: 62   Temp:    Resp: 11   SpO2: 98%         PHYSICAL EXAM:  Awake, alert  Speech is fluent  Dressings are clean and dry  Grip and interossei 4+/5      A/P:   Neuro improved  Pain controlled  Mobilize with PT/OT  Agree with SNF versus rehab  Aspen collar on when OOB  Soft foam collar when in bed  May take collar off for bathing with assistance  Follow up with me once discharged from subacute facility      Rella Larve, MD

## 2015-08-21 NOTE — Plan of Care (Addendum)
Discussed with NSGY Dr. Jaynie Collins, cleared to restart Lovenox for DVT ppx and will need outpatient follow up and evaluation prior to restarting Eliquis.  Patient also noted to have acute urinary retention likely due to recent spine surgery.  Will replace Foley and obtain Renal US.  Patient with sulfa allergy resulting in lip swelling and therefore unable to have Flomax.  Will start Proscar instead.        Addendum on 08/22/15: Retention resolved after Foley placement.  Will defer Renal US at this time.  Cr at baseline.  Continue Proscar.

## 2015-08-21 NOTE — Progress Notes (Signed)
Pt AxOx3-4. FC. MAE. Pt complains of 4-6/10 shoulder and throat pain. PRN Cepacol x1/ Tylenol x2/ Oxycodone x2 with good effect. VSS see doc flowsheet for trends. 2L NC, no desats. No BM at this time. Out of foley trial.Safety maintained. Pt resting at this time. Will continue to monitor the patient. Family updated.

## 2015-08-21 NOTE — Plan of Care (Addendum)
Problem: Pain  Goal: Patient's pain/discomfort is manageable  Outcome: Progressing  A/O x4, MAE grossly/weakly, follows commands, L pupil round and brisk, R eye prosthesis, neuro checks q 2 hrs, no acute changes, Afebrile, HR Afib 60-70s, SBP 90-120 (BP now on RUE) amlodipine held x1, oxy and tylenol given together x2, robaxin for muscle ache x1, tolerating regular diet, no BM noted, unable to urinate after foley removed this AM, urine noted by bladder scan, foley ordered to be replaced, aspen collar on and aligned, ok to have soft collar on while in bed, continue to monitor and manage any pain.

## 2015-08-21 NOTE — Progress Notes (Signed)
MEDICINE PROGRESS NOTE    Date Time: 08/21/2015 8:39 AM  Patient Name: Ernest Diaz  Attending Physician: Isaias Cowman, MD    Assessment:   Principal Problem:    Thoracic spinal AV fistula  Active Problems:    Quadriplegia    Atrial fibrillation  Resolved Problems:    * No resolved hospital problems. *      Plan:   1. Traumatic cervicothoracic epidural hematoma s/p C3-C5 + T3-T4 decompressive laminectomies 2/27 by Dr. Jaynie Collins  - Spinal angiogram did not show any obvious evidence of AVM or fistula   - s/p decompression, anterior and posterior cervical fusion by Dr. 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 Jaynie Collins prior to restarting  - Repeat PT/OT recommendations pending  - Pain control with Oxycodone, bowel regimen while on narcotics     2. Hx A Fib on Eliquis, CHADS2VASC of 2  -Rate controlled on Atenolol, bradycardia improved after decreasing dose to 25 mg q day   -Patient asymptomatic with appropriate chronotropic response  -Hold AC and antiplatelets given procedure at this time     3. HTN - No acute issues  - Continue Norvasc  - Decreased Atenolol as above due to bradycardia    - Continue Maxzide   - Goal < 150/90 as outpatient     4. Transaminitis- Mild and present on admission, hepatocellular pattern   - Patient denies abd pain, N/V, or other symptoms  - No hx of liver dysfunction, no synthetic dysfunction on labwork  - Trend daily CMP and continue to monitor at this time     DVT/GI prophylaxis - SCDs, no DVT chemical ppx until cleared by NSGY     Case discussed with: Patient, RN, NSGY    Safety Checklist:     DVT prophylaxis:  CHEST guideline (See page e199S) Mechanical   Foley:  [place] Rn Foley protocol Not present   IVs:  Peripheral IV   PT/OT: Ordered   Daily CBC & or Chem ordered:  SHM/ABIM guidelines (see #5) Yes, due to clinical and  lab instability   Reference for approximate charges of common labs: CBC auto diff - $76  BMP - $99  Mg - $79    Lines:     Patient Lines/Drains/Airways Status    Active PICC Line / CVC Line / PIV Line / Drain / Airway / Intraosseous Line / Epidural Line / ART Line / Line / Wound / Pressure Ulcer / NG/OG Tube     Name:   Placement date:   Placement time:   Site:   Days:    Peripheral IV Right Antecubital        Antecubital       Peripheral IV 08/17/15 Left;Lateral Antecubital  08/17/15   1723   Antecubital   less than 1    Urethral Catheter               Incision Site 08/17/15 Back Upper  08/17/15   1721     less than 1                 Disposition: (Please see PAF column for Expected D/C Date)   Today's date: 08/21/2015  Admit Date: 08/17/2015  4:29 PM  LOS: 4  Clinical Milestones: Neurosurgical evaluation, PT/OT recommendations    Anticipated  discharge needs: Per PT/OT recs      Subjective     CC: Arteriovenous fistula of spinal cord vessels    Interval History/24 hour events: No acute events overnight.  Reports pain is well controlled today.  Eating breakfast comfortably this AM.  Denies chest pain, shortness of breath, abdominal pain, N/V.  States he is otherwise doing well.      Review of Systems:     Review of Systems - Negative except as noted above in HPI      Physical Exam:     VITAL SIGNS PHYSICAL EXAM   Temp:  [97.2 F (36.2 C)-98.7 F (37.1 C)] 98.7 F (37.1 C)  Heart Rate:  [61-135] 62  Resp Rate:  [8-74] 13  BP: (120-188)/(60-91) 122/63 mmHg  Arterial Line BP: (107-130)/(58-60) 130/58 mmHg      Intake/Output Summary (Last 24 hours) at 08/21/15 0839  Last data filed at 08/21/15 0600   Gross per 24 hour   Intake   3250 ml   Output   5325 ml   Net  -2075 ml    Physical Exam  General: awake, alert X 3  Neck: Aspen collar in place  Cardiovascular: irregularly irregular,  no murmurs, rubs or gallops  Lungs: clear to auscultation bilaterally, without wheezing, rhonchi, or rales  Abdomen: soft, non-tender,  non-distended; no palpable masses,  normoactive bowel sounds, no rebound, no guarding  Extremities: no edema  Neuro: CN grossly intact, speech fluent, follows commands, 4/5 b/l UE and LE strength        Meds:     Medications were reviewed:    Labs:     Labs (last 72 hours):      Recent Labs  Lab 08/20/15  0140 08/19/15  0056   WBC 12.09* 9.52   HGB 13.5 13.5   HEMATOCRIT 40.3* 40.4*   PLATELETS 210 242         Recent Labs  Lab 08/20/15  0140 08/17/15  1721   PT 14.2 13.8   PT INR 1.1 1.1   PTT  --  24      Recent Labs  Lab 08/20/15  0140 08/19/15  0056   SODIUM 135* 136   POTASSIUM 3.9 4.7   CHLORIDE 100 101   CO2 26 27   BUN 23.0 15.0   CREATININE 0.6* 0.6*   CALCIUM 8.3 8.4   ALBUMIN 2.4* 2.4*   PROTEIN, TOTAL 5.2* 5.5*   BILIRUBIN, TOTAL 0.5 0.6   ALKALINE PHOSPHATASE 66 64   ALT 61* 49   AST (SGOT) 36* 34   GLUCOSE 113* 123*                   Microbiology, reviewed and are significant for:  Microbiology Results     Procedure Component Value Units Date/Time    MRSA culture [[id number]] Collected:  08/17/15 2043    Specimen Information:  Body Fluid from Nares and Throat Updated:  08/18/15 0303            Imaging, reviewed and are significant for:  Fluoroscopy Greater Than 1 Hour    08/20/2015    Fluoroscopic guidance  Gerlene Burdock, MD 08/20/2015 8:15 PM           Signed by: Isaias Cowman, MD

## 2015-08-21 NOTE — PT Eval Note (Signed)
[facility]   Physical Therapy Evaluation     Patient: Ernest Diaz    MRN#: [id number]   Unit: [facility] TOWER 4  Bed: F432/F432.01    Discharge Recommendations:   Discharge Recommendation: SNF (may progress to Acute Rehab depending on tolerance)       DME Recommendation: TBD at Rehab    If SNF (may progress to Acute Rehab depending on tolerance) recommended discharge disposition is not available, patient will need max assist x2 for sitting on EOB, hospital bed, wheelchair, hoyer lift  equipment, and continued skilled therapy. Stretcher transport to destination.     Please see latest notes for d/c planning.    Assessment:   Ernest Diaz is a 70 y.o. male admitted 08/17/2015 with decreased strength(left side slightly stronger than right side UE and LE both and needed focusses assessment),decreased balance, decreased endurance and decreased functional transfers. Patient sat on EOB with max.assist x 2/dependent, leans to left and  fear of fall. Patient will benefit from continued therapy to maximize functional independence with transfers and safety.     Therapy Diagnosis: Impaired transfers    Rehabilitation Potential: Good    Treatment Activities: PT eval,bed mobility, sitting balance, therex (AP<QUAD SETS<guteal sets;5 reps)  Educated the patient to role of physical therapy, plan of care, goals of therapy and HEP, safety with mobility and ADLs, spine precautions.    Plan:   PT Frequency: 3-4x/wk    Treatment/Interventions: Bed mobility, sitting balance and tolerance, therex    Risks/benefits/POC discussed with the patient       Precautions and Contraindications:   Falls  Cervical precautions -Aspen collar all the time    Consult received for Ernest Diaz for PT Evaluation and Treatment.  Patient's medical condition is appropriate for Physical Therapy intervention at this time.    History of Present Illness:   Ernest Diaz  is a 70 y.o. male admitted on 08/17/2015 per H&P,"[place] 2/25 after a fall. CT head and spine were negative so he was discharged. 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 MRI cervical and thoracic spine showed spinal epidural hematoma extending from the foramen magnum to the upper thoracic spine. Neurosurgery (Dr. Jaynie Collins) was consulted. His Eliquis was reversed. He ultimately had C3-C5 and T3-T4 decompressive laminectomies 2/27 by Dr. Jaynie Collins. He noted extensive dorsal epidural venous plexus and when he dissected out these vessels they bled. Dr. Jaynie Collins requested he be transferred here for spinal angiogram. 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 Hypertension    . Arthritis    . Pericarditis 1975   . Atrial fibrillation    . Arrhythmia      Past Surgical History   Procedure Laterality Date   . Vein surgery     . Colonoscopy       x2   . 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  Eye surgery  1960     Rt eye removed   . Decompression, anterior cervical, fusion, levels 2 N/A 08/20/2015     Procedure: DECOMPRESSION, ANTERIOR CERVICAL, FUSION, LEVELS 2;  Surgeon: Madaline Savage, MD;  Location: [place] [facility];  Service: Neurosurgery;  Laterality: N/A;  C5-C7 ACDF; AVM REMOVAL   . Decompression, posterior cervical, fusion, levels 2 N/A 08/20/2015     Procedure: DECOMPRESSION, POSTERIOR CERVICAL, FUSION, LEVELS 2;  Surgeon:  Madaline Savage, MD;  Location: [place] [facility] OR;  Service: Neurosurgery;  Laterality: N/A;     Imaging/Tests/Labs:  MRI Cervical Spine W WO Contrast   Impression:      Postsurgical change with prior laminectomy. 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 There  findings suspicious for a disc ligamentous injury at this level.  Correlation with CT is advised.      Social History:   Prior Level of Function: Independent  Assistive Devices: None  Baseline Activity: Community  DME Currently at Home: None  Home Living Arrangements: Lives with wife  Type of Home: [facility] ([place])  Home Layout: 1 STE and 13 steps to bedroom/bathroom    Subjective:"I am weak."   Patient is agreeable to participation in the therapy session. Nursing clears patient for therapy.     Patient Goal:" I want to pitch at baseball game."    Pain:   Scale:not rated  Location: shoulder   Intervention: rest and respositioned    Objective:   Patient is in bed with tele, IV line, IMC monitors,foley , aspen collar in place.       Cognitive Status and Neuro Exam:  Alert and Oriented x 3. Able to follow commands. At times, seeing things on floor -like he said, "is it my tooth on the floor."    Musculoskeletal Examination  Focussed  RUE ROM: Shoulder ~90 deg(cervical precautions);Elbow flexion/ ext ~90 deg; Wrist: Flex/ ext [facility]; Fingers-able to flex and no extension  LUE ROM: Shoulder ~90 deg(cervical precautions);Elbow flexion/ ext ~90 deg; Wrist: Flex/ ext [facility]; Fingers-able to flex and no extension  RLE ROM: Ankle WFL;Hip flexion / Ext but slight Abd/ add; Knee flex/ ext ~40 deg  LLE ROM: Ankle WFL;Hip flexion / Ext No active range slight Abd/ add;; Knee flex/ ext ~40 deg    Focussed:  RUE Strength: Shoulder:3/5;Elbow:3-/5 with extensors 3/5; Wrist flex/ ext:3/5  LUE Strength: Shoulder:3-/5;Elbow:3-/5 with extensors 3-/5; Wrist flex/ ext:3-/5  RLE Strength: Hip abd:3/5 and add:3-/5; Knee flexion/ext:3/5; Ankle 3/5  LLE Strength: Hip  abd:3-/5 and add:3-/5; Knee flexion/ext:2+/5; Ankle 3/5    Functional Mobility  Rolling: Max.assist x 2  Supine to Sit: Max.assist x 2 with HOB elevated, able to hold bed rail with left hand (on left side)  Sit to Supine: Dependent  Scooting: Max.assist x 2, fear of fall         Balance  Static Sitting: Poor (leaning to left)      Participation and Activity Tolerance  Participation Effort: Good  Endurance: Fair+    Patient left with call bell within reach, all needs met, SCDs off as found, fall mat in place, bed alarm on, and all questions answered. RN notified of session outcome and patient response.     Goals:  Goals  Goal Formulation: With patient  Time for Goal Acheivement: 7 visits  Goals: Select goal  Pt Will Go Supine To Sit: with moderate assist  Pt Will Perform Sit To Supine: with moderate assist  Pt Will Sit  Edge of Bed: 6-10 min, with moderate assist  Pt Will Achieve Sitting Balance: 2/5 supports self independently with both UEs  Pt Will Perform Home Exer Program: with minimal assist      Synetta Shadow  PT  Pager Number:[phone]    Time of Treatment  PT Received On: 08/21/15  Start Time: 1400  Stop Time: 1448  Time Calculation (min): 48 min

## 2015-08-22 ENCOUNTER — Other Ambulatory Visit: Payer: Medicare Other

## 2015-08-22 LAB — COMPREHENSIVE METABOLIC PANEL
ALT: 25 U/L (ref 0–55)
AST (SGOT): 21 U/L (ref 5–34)
Albumin/Globulin Ratio: 1.1 (ref 0.9–2.2)
Albumin: 2.8 g/dL — ABNORMAL LOW (ref 3.5–5.0)
Alkaline Phosphatase: 62 U/L (ref 38–106)
BUN: 18 mg/dL (ref 9.0–28.0)
Bilirubin, Total: 0.7 mg/dL (ref 0.2–1.2)
CO2: 30 mEq/L — ABNORMAL HIGH (ref 22–29)
Calcium: 8.2 mg/dL (ref 7.9–10.2)
Chloride: 100 mEq/L (ref 100–111)
Creatinine: 0.6 mg/dL — ABNORMAL LOW (ref 0.7–1.3)
Globulin: 2.5 g/dL (ref 2.0–3.6)
Glucose: 93 mg/dL (ref 70–100)
Potassium: 3.9 mEq/L (ref 3.5–5.1)
Protein, Total: 5.3 g/dL — ABNORMAL LOW (ref 6.0–8.3)
Sodium: 137 mEq/L (ref 136–145)

## 2015-08-22 LAB — CBC
Hematocrit: 36.2 % — ABNORMAL LOW (ref 42.0–52.0)
Hgb: 11.7 g/dL — ABNORMAL LOW (ref 13.0–17.0)
MCH: 32.4 pg — ABNORMAL HIGH (ref 28.0–32.0)
MCHC: 32.3 g/dL (ref 32.0–36.0)
MCV: 100.3 fL — ABNORMAL HIGH (ref 80.0–100.0)
MPV: 10.5 fL (ref 9.4–12.3)
Nucleated RBC: 0 /100 WBC (ref 0–1)
Platelets: 164 10*3/uL (ref 140–400)
RBC: 3.61 10*6/uL — ABNORMAL LOW (ref 4.70–6.00)
RDW: 14 % (ref 12–15)
WBC: 11.75 10*3/uL — ABNORMAL HIGH (ref 3.50–10.80)

## 2015-08-22 LAB — GFR: EGFR: >60

## 2015-08-22 MED ORDER — METHOCARBAMOL 750 MG PO TABS
750.0000 mg | ORAL_TABLET | Freq: Three times a day (TID) | ORAL | Status: DC | PRN
Start: 2015-08-22 — End: 2015-09-24

## 2015-08-22 MED ORDER — ACETAMINOPHEN 325 MG PO TABS
325.0000 mg | ORAL_TABLET | ORAL | Status: AC | PRN
Start: 2015-08-22 — End: ?

## 2015-08-22 MED ORDER — OXYCODONE HCL 5 MG PO TABS
15.0000 mg | ORAL_TABLET | ORAL | Status: DC | PRN
Start: 2015-08-22 — End: 2015-09-05
  Administered 2015-08-22 – 2015-08-30 (×21): 15 mg via ORAL
  Filled 2015-08-22 (×22): qty 3

## 2015-08-22 MED ORDER — OXYCODONE HCL 10 MG PO TABS
10.0000 mg | ORAL_TABLET | ORAL | Status: DC | PRN
Start: 2015-08-22 — End: 2015-09-24

## 2015-08-22 MED ORDER — ATENOLOL 50 MG PO TABS
25.0000 mg | ORAL_TABLET | Freq: Every day | ORAL | Status: DC
Start: 2015-08-22 — End: 2015-09-24

## 2015-08-22 MED ORDER — FINASTERIDE 5 MG PO TABS
5.0000 mg | ORAL_TABLET | Freq: Every evening | ORAL | Status: DC
Start: 2015-08-22 — End: 2015-09-24

## 2015-08-22 MED ORDER — SENNOSIDES-DOCUSATE SODIUM 8.6-50 MG PO TABS
1.0000 | ORAL_TABLET | Freq: Two times a day (BID) | ORAL | Status: AC
Start: 2015-08-22 — End: ?

## 2015-08-22 NOTE — Progress Notes (Signed)
Case Management Note:    DELAY: Pending OT Evaluation, and SNF preference. Patient and family have not been given a list of SNF's since the PT evaluation was completed yesterday afternoon and the recommendation was made for SNF.      Awaiting OT evaluation. Patient recommended for SNF by PT yesterday afternoon. SW placed referrals in [employer] to SNF's in the area today and is awaiting decision and results. SW will have to discuss referrals and choice with the patient either tomorrow or Monday morning.       Everardo Pacific, MSW  Care Coordinator  [facility]  [phone]  [email or web address]

## 2015-08-22 NOTE — Op Note (Signed)
Procedure Date: 08/20/2015     Patient Type: I     SURGEON: Rella Larve MD  ASSISTANT:  Willa Rough FNP     PREOPERATIVE DIAGNOSES:  1.  Spinal cord dural AV fistula.  2.  C6-C7 fracture.     POSTOPERATIVE DIAGNOSIS:  1.  Spinal cord dural AV fistula.  2.  C6-C7 fracture.     TITLE OF PROCEDURE:  1.  C6-C7 anterior cervical diskectomy for decompression of central canal  and both proximal foramen.  2.  Arthrodesis at C6-C7 with placement of a PEEK cage using the Globus  coalition spacer with vertebral body screws at C6-C7.  3.  Posterior C6-C7 laminectomy for resection of a dural AV fistula.  4.  Posterior cervical fusion at C6-C7.  5.  Placement of lateral mass screws from C6 to C7 using the Globus system.  6.  Intraoperative microscope for microsurgical dissection.  7.  Intraoperative fluoroscopy lasting greater than 1 hour fro localization  and hardware placement.       ANESTHESIA:  General, local.     INDICATIONS:  The patient is a 70 year old male who presented approximately 10 days ago  with acute onset of quadriparesis and with complete paralysis of his legs  to [facility].  His dominant finding at that time was a large  extensive epidural hematoma from the top of the cervical spine done down to  the thoracic area.  He therefore underwent decompression of the thickest  components of the epidural hematoma and the cervical spine and thoracic  spine through 2 separate incisions with the posterior exposure.  His  neurological examination improved significantly following this  decompression, but he did have some residual weakness, particularly in his  hands.  Because of the intraoperative findings of engorged an extensive  dorsal epidural veins, I felt that a subtle dural AV fistula may be  present, which was not appreciated on CT scan and MRI.  Therefore, he was  transferred to [facility] for spinal angiogram.  This was  performed, which did not show any obvious spinal malformations.  He  then  underwent a repeat cervical MRI for surgical planning, which showed more  clearcut obvious traumatic injury at the C6-C7 level as well.  Therefore,  it was felt that operative intervention with both anterior and posterior  decompressions and fusions were indicated.     DESCRIPTION OF PROCEDURE:  After informed consent was obtained, the patient was taken to the operating  room where positive identification was made, general anesthesia was  induced.  He was then placed supine on the operating room table.  With his  head in neutral position, a transverse incision was then marked out in the  lower cervical area on the right side.  This site was marked out and  prepped and draped in the usual sterile fashion.  After infiltration with  1% lidocaine with epinephrine, skin incision was made using a scalpel.   Dissection was then carried down and the platysma muscle was incised in  line with the incision.  The subplatysmal layer was then developed in order  to expose the medial border of the sternocleidomastoid muscle.  We  dissected sharply until we were able to appreciate the prevertebral space.   At this point, intraoperative x-rays were used to confirm our localization.   At this point, we elevated the medial insertion points of longus colli  muscles.  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  Therefore, his the anterior longitudinal  ligaments were all calcified, no obvious breaks were appreciated.  Once the  retractors were placed and again localization confirmed using fluoroscopy,  we used [place] rongeurs to take down the anterior bridging osteophytes  which allowed [place] to appreciate the disk space at C6-C7.  At this point, I  was able to appreciate that there was some partial disruption of the disk  space.  The diskectomy was performed under magnification using  microcurettes and rongeurs.  We then drilled off the endplates using a  high-speed drill.  Micro  crescent rongeurs were then used to decompress the  ventral epidural space as well as both proximal foramen.  Although we took  down several layers of soft tissue presumed to the posterior longitudinal  ligament, I was not able to find a collection of epidural hematoma as was  suggested on the MRI scan.  The layer that we had encountered did appear to  be dura, which had been discolored from subacute blood.  Therefore, at this  point, we opted to proceed with our reconstruction.  Copious amount of  irrigation was performed.  End plates were decorticated.  We sized a 5 x 7  lordotic coalition PEEK spacer.  This was packed with the patient's own  morcellized autograft.  This was placed into the interspace and secured  using 14-mm screws into the vertebral bodies of C6 and C7.  At this point,  our retractors were removed and closure performed in layers.       At this point, the patient was repositioned into a prone position on gel  bumps.  His head was held in neutral position in 3-point fixation using a  [place] head holder.  His prior posterior wall incision was prepped and  draped in the usual sterile fashion.  We then reopened this prior incision  and extended this inferiorly for another 4 cm which gave [place] adequate  exposure to dissect down to the C7 level.  Retractors were then placed.  At  this point, we were able to appreciate the widened facet on the right side  at C6-C7.  The facets at levels above and below appeared to be auto fused.   We proceeded to place lateral mass screws using standard landmarks.  We  used 10-mm screws at all sites.  Because of the patient's large body  habitus, fluoroscopic guidance was not possible.  We palpated the pilot  holes to confirm good presence of bone circumferentially without breakout.   Once the screws were in, we proceeded to perform laminectomies of the  remaining C5 spinous processes and lamina, as well as C6 and C7, fully.   Once this was accomplished, we exposed  again a network of engorged venous  plexus dorsal to the cord as well as what appeared to be an arterialized  vein crossing the thecal sac below the C7 lamina.  This crossing vein was  ligated with titanium Weck clips on either side, coagulated, resected and  sent to pathology.  The remaining venous plexus was then coagulated.  No  significant residual subdural hematoma could be appreciated at this time.   A copious amount of irrigation was then performed.  Her lateral mass screws  were then connected using rods and locked down using set screws.  We  decorticated the facets at C6 and C7 and packed this area with our  morselized autograft.  Retractors were then removed and closure performed  in layers.  Sterile dressings were applied.  The patient was then returned  to the supine position.  The collar was placed back on.  [place]  headholder was removed.  I should add that the EMG and SSEP signals did  improve following the anterior decompression and fusion.  The patient was  extubated and transferred to the recovery room in stable condition.     ESTIMATED BLOOD LOSS:  Approximately 100 mL.           D:  08/21/2015 21:25 PM by Dr. Rella Larve, MD ([id number])  T:  08/22/2015 14:14 PM by NTS      Everlean Cherry: [id number]) (Doc ID: [id number])

## 2015-08-22 NOTE — Plan of Care (Signed)
Problem: Safety  Goal: Patient will be free from injury during hospitalization  Intervention: Assess patient's risk for falls and implement fall prevention plan of care per policy  Bed in low position , floor mat on both sides of patient bed, bed alarm off. Patient be checked every hour by nursing staff      Problem: Pain  Goal: Patient's pain/discomfort is manageable  Intervention: Include patient/family/caregiver in decisions related to pain management  Continue on  Oxycodone PRN for pain.Othe intervention in place

## 2015-08-22 NOTE — Discharge Summary (Signed)
MEDICINE DISCHARGE SUMMARY    Date Time: 08/22/2015 3:35 PM  Patient Name: Ernest Diaz  Attending Physician: Isaias Cowman, MD  Primary Care Physician: Berton Lan, MD    Date of Admission: 08/17/2015  Date of Discharge: 08/22/15    Discharge Diagnoses:     Principal Diagnosis (Diagnosis after study, that is chiefly responsible for admission to inpatient status): Arteriovenous fistula of spinal cord vessels  Active Hospital Problems    Diagnosis POA   . 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 Quadriplegia Unknown   . Atrial fibrillation Yes     Chronic      Resolved Hospital Problems    Diagnosis POA   No resolved problems to display.       Disposition:      SNF: Placement pending    Pending Results, Recommendations & Instructions to providers after discharge:     1. Micro / Labs / Path pending:   Unresulted Labs     None        2. Wound Care Instructions: Per Neurosurgery recommendations   3. Date of completion for antibiotics or other medications: N/A  4. Other: N/A    Recent Labs - Last 2:         Recent Labs  Lab 08/22/15  0355 08/20/15  0140   WBC 11.75* 12.09*   HGB 11.7* 13.5   HEMATOCRIT 36.2* 40.3*   PLATELETS 164 210         Recent Labs  Lab 08/20/15  0140 08/17/15  1721   PT 14.2 13.8   PT INR 1.1 1.1   PTT  --  24       Recent Labs  Lab 08/22/15  0355 08/20/15  0140   ALKALINE PHOSPHATASE 62 66   BILIRUBIN, TOTAL 0.7 0.5   PROTEIN, TOTAL 5.3* 5.2*   ALBUMIN 2.8* 2.4*   ALT 25 61*   AST (SGOT) 21 36*        Recent Labs  Lab 08/22/15  0355 08/20/15  0140   SODIUM 137 135*   POTASSIUM 3.9 3.9   CHLORIDE 100 100   CO2 30* 26   BUN 18.0 23.0   GLUCOSE 93 113*   CALCIUM 8.2 8.3                 Invalid input(s): FREET4         Procedures/Radiology performed:   Radiology: all results from this admission  Mri Cervical Spine W Wo Contrast    08/19/2015   Postsurgical change with prior laminectomy. Severe stenosis C6-C7 with cord indentation and suspected  myelopathic change. There findings suspicious for a disc ligamentous injury at this level. Correlation with CT is advised. Findings discussed with and acknowledged by Dr. Sheran Luz 10:44  p.m. 08/19/2015 Melody Haver, MD 08/19/2015 10:47 PM     Fluoroscopy Greater Than 1 Hour    08/20/2015    Fluoroscopic guidance  Gerlene Burdock, MD 08/20/2015 8:15 PM     Xr Chest Ap Portable    08/18/2015  No acute process Blair Promise, MD 08/18/2015 9:09 AM     Spinal Angiogram    08/21/2015  No angiographic evidence of intracranial aneurysm, arteriovenous malformation or dural fistula. Is no angiographic evidence of spinal arteriovenous malformation or dural fistula. 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 Darleen Crocker, MD 08/21/2015 1:27 PM     Surgery: all results from this  admission  Procedure(s) with comments:  DECOMPRESSION, ANTERIOR CERVICAL, FUSION, LEVELS 2 (N/A) - C5-C7 ACDF; AVM REMOVAL  DECOMPRESSION, POSTERIOR CERVICAL, FUSION, LEVELS 2 (N/A)     Hospital Course:     Reason for admission/ HPI (as obtained by Dr. Cyndie Chime):     Ernest Diaz is a 71 y.o. male hx afib on Eliquis + aspirin, HTN who presented to [facility]/25 after a fall. CT head and spine were negative so he was discharged. 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 MRI cervical and thoracic spine showed spinal epidural hematoma extending from the foramen magnum to the upper thoracic spine. Neurosurgery (Dr. Jaynie Collins) was consulted. His Eliquis was reversed. He ultimately had C3-C5 and T3-T4 decompressive laminectomies 2/27 by Dr. Jaynie Collins. He noted extensive dorsal epidural venous plexus and when he dissected out these vessels they bled. Dr. Jaynie Collins requested he be transferred here for spinal angiogram. These symptoms are sudden onset, severe intensity, without alleviating factors.      Hospital Course:     1. Traumatic cervicothoracic epidural hematoma s/p C3-C5 + T3-T4 decompressive  laminectomies 2/27 by Dr. Jaynie Collins- Patient was transferred for spinal angiogram which did not show obvious AVM or fistula. MRI C-spine showed severe stenosis at C6-C7, cord indentation and concern for ligamentous disc injury. Patient underwent decompression, anterior and posterior cervical fusion by Dr. Jaynie Collins on 08/20/15. Pain was controlled with Oxycodone post-procedure.  Given hx of A Fib, antiplatelets and AC were discussed with neurosurgery Dr. Jaynie Collins.  Per Neurosurgery however both antiplatelets and AC are contra-indicated at this time.  Patient will need to follow up as an outpatient for further evaluation and monitoring prior to starting antiplatelets or AC.      2. Hx A Fib on Eliquis, CHADS2VASC of 2.  Patient is rate controlled on Atenolol however dose was decreased to 25 mg q day given bradycardia.  Patient was asymptomatic and with an appropriate chronotropic response.  Patient is aware of increased risk of CVA without antiplatelets or AC however also informed regarding contra-indication per Neurosurgery given recent procedure.  Per Dr. Jaynie Collins, patient will need to be re-evaluated as an outpatient prior to restarting [facility] or antiplatelets.      3. HTN - No acute issues and patient will continue Norvasc and Maxzide.  Atenolol was decreased as above.  Goal BP as outpatient is < 150/90.      4. Transaminitis- Mild and present on admission with a hepatocellular pattern.  Transaminitis resolved without acute intervention and patient was asymptomatic.  Patient does not have a history of liver dysfunction and no synthetic dysfunction was noted on labwork.       5. Urinary Retention- This was likely due to recent neurosurgical procedure.  Retention resolved with Foley placement and Cr was at baseline.  Patient will need outpatient bladder training and follow up in rehab.       Note if any home meds were changed/discontinued and why: Atenolol due to bradycardia, antiplatelets and AC due to recent surgery     Discharge Day  Exam:  Temp:  [97.4 F (36.3 C)-98.7 F (37.1 C)] 98.1 F (36.7 C)  Heart Rate:  [55-68] 67  Resp Rate:  [5-21] 11  BP: (94-125)/(50-66) 125/64 mmHg     General: awake, alert X 3  Neck: Aspen collar in place  Cardiovascular: irregularly irregular, no murmurs, rubs or gallops  Lungs: clear to auscultation bilaterally, without wheezing, rhonchi, or rales  Abdomen: soft, non-tender, non-distended; no  palpable masses, normoactive bowel sounds, no rebound, no guarding  Extremities: no edema  Neuro: CN grossly intact, speech fluent, follows commands, 4/5 b/l UE and LE strength     Wounds/decutibus ulcers/stage: N/A    Consultations:     Treatment Team:   Attending Provider: Isaias Cowman, MD  Consulting Physician: Isaias Cowman, MD  Consulting Physician: Madaline Savage, MD    Discharge Condition:     Stable, improved     Discharge Instructions & Follow Up Plan for Patient:     Diet: Regular     Activity/Weight Bearing Status: Resume activities as tolerated, refrain from strenuous activities       Patient was instructed to follow up with:     Follow-up Information     Follow up with Berton Lan, MD In 3 days.    Specialty:  Internal Medicine    Contact information:    [address]  [place] [address]  [phone]          Follow up with Madaline Savage, MD. Schedule an appointment as soon as possible for a visit in 1 week.    Specialty:  Neurological Surgery    Contact information:    [address]  [address]  [place] [address]  [phone]              Discharge Code Status: Full Code     Patient Emergency Contact: Extended Emergency Contact Information  Primary Emergency Contact: Mena,Alice  Address: [address]           [place], [place] [address] [place]  Home Phone: [phone]  Mobile Phone: [phone]  Relation: Spouse  Secondary Emergency Contact: Matthew [place]  Mobile Phone: [phone]  Relation: Son    Complete instructions  and follow up are in the patient's After Visit Summary    Minutes spent coordinating discharge and reviewing discharge plan: I spent 35 minutes discussing the patient's medications, medical conditions, symptoms, and follow up with the patient on day of discharge.      Discharge Medications:        Discharge Medication List      Taking          acetaminophen 325 MG tablet   Dose:  325 mg   Commonly known as:  TYLENOL   Take 1 tablet (325 mg total) by mouth every 4 (four) hours as needed.       amitriptyline 10 MG tablet   Dose:  10 mg   Commonly known as:  ELAVIL   Take 10 mg by mouth nightly. Started at [facility] 08/2015       amLODIPine 10 MG tablet   Dose:  10 mg   Commonly known as:  NORVASC   Take 10 mg by mouth daily.       atenolol 50 MG tablet   Dose:  25 mg   What changed:  how much to take   Commonly known as:  TENORMIN   Take 0.5 tablets (25 mg total) by mouth daily.       CALCIUM 600 + D PO   Take by mouth daily.       CENTRUM SILVER ADULT 50+ PO   Take by mouth daily.       finasteride 5 MG tablet   Dose:  5 mg   Commonly known as:  PROSCAR   Take 1 tablet (5 mg total) by mouth nightly.  Fish Oil 1200 MG Caps   Take by mouth daily.       FLAX SEEDS PO   Take by mouth daily.       folic acid 400 MCG tablet   Dose:  400 mcg   Commonly known as:  FOLVITE   Take 400 mcg by mouth daily.       Magnesium 300 MG Caps   Take by mouth daily.       methocarbamol 750 MG tablet   Dose:  750 mg   Commonly known as:  ROBAXIN   Take 1 tablet (750 mg total) by mouth 3 (three) times daily as needed.       oxyCODONE 10 MG Tabs   Dose:  10 mg   Take 1 tablet (10 mg total) by mouth every 4 (four) hours as needed for Pain.       senna-docusate 8.6-50 MG per tablet   Dose:  1 tablet   Commonly known as:  PERICOLACE   Take 1 tablet by mouth 2 (two) times daily.       triamterene-hydrochlorothiazide 37.5-25 MG per tablet   Dose:  1 tablet   Commonly known as:  MAXZIDE-25   Take 1 tablet by mouth daily.       vitamin D3 1000  units Caps   Take by mouth every mon, wed and fri.         STOP taking these medications          aspirin EC 81 MG EC tablet       ELIQUIS 5 MG   Generic drug:  apixaban           [facility]  Department of Medicine  P: [phone]  F: [phone]    Signed by: Isaias Cowman, MD    CC: Berton Lan, MD

## 2015-08-22 NOTE — Plan of Care (Signed)
Problem: Moderate/High Fall Risk Score >5  Goal: Patient will remain free of falls  Outcome: Progressing  Keep patient free of fall during hospitalization    Comments:   Assess risk of fall per unit policy, orient patient for safety environment and call-bell use, answer call and make round immediately to prevent from fall.

## 2015-08-22 NOTE — Plan of Care (Signed)
Problem: Pain  Goal: Patient's pain/discomfort is manageable  Outcome: Progressing  Pt A&Ox4, FC, MAE weakly, Neuro checks done Q2 with no significant changes noted. Afebrile, Pt in A-fib with HR 50s-60s, SBP 90s-120s, pulses palpable. On RA, lungs CTAB, no desaturations throughout shift. Foley in place with AUO, No BM this shift. Pt c/o moderate-severe L shoulder and back pain, PRN Tylenol 325mg  and Oxycodone 10mg  given 2x with good effect. Pt turned Q2, soft collar on and aligned, safety maintained, RN will continue to monitor.

## 2015-08-22 NOTE — Progress Notes (Signed)
Patient continue to be alert and oriented. Constantly asking to change position, very uncomfortable with neck colar. Medicated for shoulder pain several times on this shift. Patient has PO  PRN oxycodone for pain. TARP every 2 hours and PRN . Patient has a discharge ordered to SNF, awaiting placement arrangement. Social will arrange placement and notify RN. Hourly rounds done by RN and other nursing staff through out this shift. All needs addressed.

## 2015-08-23 MED ORDER — OXYCODONE HCL ER 10 MG PO T12A
10.0000 mg | EXTENDED_RELEASE_TABLET | Freq: Two times a day (BID) | ORAL | Status: DC
Start: 2015-08-23 — End: 2015-08-23

## 2015-08-23 MED ORDER — SCOPOLAMINE 1 MG/3DAYS TD PT72
1.0000 | MEDICATED_PATCH | TRANSDERMAL | Status: DC
Start: 2015-08-23 — End: 2015-08-30
  Administered 2015-08-23 – 2015-08-29 (×3): 1 via TRANSDERMAL
  Filled 2015-08-23 (×3): qty 1

## 2015-08-23 MED ORDER — OXYCODONE HCL ER 10 MG PO T12A
10.0000 mg | EXTENDED_RELEASE_TABLET | Freq: Two times a day (BID) | ORAL | Status: DC
Start: 2015-08-23 — End: 2015-08-28
  Administered 2015-08-23 – 2015-08-28 (×10): 10 mg via ORAL
  Filled 2015-08-23 (×10): qty 1

## 2015-08-23 NOTE — Plan of Care (Addendum)
Please refer to flowsheets for vitals and trends.  Pt's neuro status unchanged from previous shifts.  Pt reported intense right shoulder pain (described as muscle soreness) that was uncontrolled PRN oxycodone(given 2xs), riboxin (given 1xs), and tylenol (given 1xs). Pt started on BID scheduled oxycodone in afternoon after PRNs given.  Pt remains in A-fib with HR 50-70, BP stable.  Afebrile.  Pt breathing easily on RA, but with persistent productive cough.  Pt had great difficulty swallowing water without coughing.  RN paged MD Wynetta Fines, who will order repeat swallow eval.  RT demonstrated acapello for pt.  Pt reported some relief after rusing.  AUO from foley.  No BM this shift.  CM will try to place pt in rehab tomorrow.  RN left VM for PT/OT department, but OT did not see pt today.  RN will call again tomorrow.  Pt updated and support given.

## 2015-08-23 NOTE — Progress Notes (Addendum)
DELAY:    Pending OT Eval and disposition to either SNF or Acute Rehab    SW provided list of SNF's in the area that could meet the patient's needs at this time and discussed the difference between SNF and Acute Rehab. [facility] is following for potential admission and is reaching out to speak with the patient's son today.       SW will continue to follow for SNF/Acute Rehab placement.     ADDENDUM: 2:01    SW received a call from admissions liaison from [facility] , patient has been denied admission to [facility]. SW will update the family.            Everardo Pacific, MSW  Care Coordinator  [facility]  [phone]  [email or web address]

## 2015-08-23 NOTE — Progress Notes (Signed)
S: No acute events overnight.  Reports pain well controlled.  Repeat bladder scan after Foley placement shows 80 cc.    O:    Filed Vitals:    08/23/15 1100   BP: 107/58   Pulse: 60   Temp:    Resp: 12   SpO2: 95%       General: awake, alert X 3  Neck: Aspen collar in place  Cardiovascular: irregularly irregular, no murmurs, rubs or gallops  Lungs: clear to auscultation bilaterally, without wheezing, rhonchi, or rales  Abdomen: soft, non-tender, non-distended; no palpable masses, normoactive bowel sounds, no rebound, no guarding  Extremities: no edema  Neuro: CN grossly intact, speech fluent, follows commands, 4/5 b/l UE and LE strength     A/P:    1. Traumatic cervicothoracic epidural hematoma s/p C3-C5 + T3-T4 decompressive laminectomies 2/27 by Dr. Jaynie Collins- Patient was transferred for spinal angiogram which did not show obvious AVM or fistula. MRI C-spine showed severe stenosis at C6-C7, cord indentation and concern for ligamentous disc injury. Patient underwent decompression, anterior and posterior cervical fusion by Dr. Jaynie Collins on 08/20/15. Pain is controlled with Oxycodone post-procedure. Given hx of A Fib, antiplatelets and AC were discussed with neurosurgery Dr. Jaynie Collins. Per Neurosurgery however both antiplatelets and AC are contra-indicated at this time. Patient will need to follow up as an outpatient for further evaluation and monitoring prior to starting antiplatelets or AC.  Foley was placed for retention post-procedure and retention has resolved after catheter placement.  Patient will need voiding trials at SNF.        2. Hx A Fib on Eliquis, CHADS2VASC of 2. Patient is rate controlled on Atenolol however dose was decreased to 25 mg q day given bradycardia. Patient remains asymptomatic and with an appropriate chronotropic response. Patient is aware of increased risk of CVA without antiplatelets or AC however also informed regarding contra-indication per Neurosurgery given recent procedure. Per Dr. Jaynie Collins,  patient will need to be re-evaluated as an outpatient prior to restarting [facility] or antiplatelets.     D/C order placed 08/22/15.  OT recs pending.  OT consult placed on 08/21/15.

## 2015-08-24 ENCOUNTER — Inpatient Hospital Stay: Payer: Medicare Other

## 2015-08-24 ENCOUNTER — Encounter: Payer: Self-pay | Admitting: Neurological Surgery

## 2015-08-24 LAB — CBC
Hematocrit: 37.5 % — ABNORMAL LOW (ref 42.0–52.0)
Hgb: 12.4 g/dL — ABNORMAL LOW (ref 13.0–17.0)
MCH: 32.8 pg — ABNORMAL HIGH (ref 28.0–32.0)
MCHC: 33.1 g/dL (ref 32.0–36.0)
MCV: 99.2 fL (ref 80.0–100.0)
MPV: 11 fL (ref 9.4–12.3)
Nucleated RBC: 0 /100 WBC (ref 0–1)
Platelets: 188 10*3/uL (ref 140–400)
RBC: 3.78 10*6/uL — ABNORMAL LOW (ref 4.70–6.00)
RDW: 14 % (ref 12–15)
WBC: 12.48 10*3/uL — ABNORMAL HIGH (ref 3.50–10.80)

## 2015-08-24 LAB — URINALYSIS WITH MICROSCOPIC
Bilirubin, UA: NEGATIVE
Glucose, UA: NEGATIVE
Ketones UA: 20 — AB
Nitrite, UA: NEGATIVE
Protein, UR: 30 — AB
Specific Gravity UA: 1.024 (ref 1.001–1.035)
Urine pH: 6 (ref 5.0–8.0)
Urobilinogen, UA: 2 mg/dL (ref 0.2–2.0)

## 2015-08-24 LAB — GFR: EGFR: >60

## 2015-08-24 LAB — BASIC METABOLIC PANEL
BUN: 12 mg/dL (ref 9.0–28.0)
CO2: 28 mEq/L (ref 22–29)
Calcium: 8.4 mg/dL (ref 7.9–10.2)
Chloride: 97 mEq/L — ABNORMAL LOW (ref 100–111)
Creatinine: 0.6 mg/dL — ABNORMAL LOW (ref 0.7–1.3)
Glucose: 74 mg/dL (ref 70–100)
Potassium: 4.2 mEq/L (ref 3.5–5.1)
Sodium: 134 mEq/L — ABNORMAL LOW (ref 136–145)

## 2015-08-24 LAB — LAB USE ONLY - HISTORICAL SURGICAL PATHOLOGY

## 2015-08-24 MED ORDER — INFUVITE ADULT IV INJ
INTRAVENOUS | Status: DC
Start: 2015-08-24 — End: 2015-08-25
  Filled 2015-08-24 (×2): qty 1000

## 2015-08-24 MED ORDER — SODIUM CHLORIDE 0.9 % IV MBP
4.5000 g | Freq: Four times a day (QID) | INTRAVENOUS | Status: AC
Start: 2015-08-24 — End: 2015-09-04
  Administered 2015-08-24 – 2015-09-03 (×42): 4.5 g via INTRAVENOUS
  Filled 2015-08-24 (×42): qty 20

## 2015-08-24 NOTE — SLP Eval Note (Addendum)
[facility]   Speech and Language Therapy Bedside Swallow Evaluation     Patient: Alison Breeding III    MRN#: [id number]     Consult received for March Rummage III for SLP Bedside Swallow Evaluation and Treatment.    Plan/Recommendations:   VFSS 3/14 at 0930  Diet Recommendations: NPO (except meds crushed in applesauce)   Administration of Medications: PO, crushed, with puree (or alternate route)  Precautions/Compensations:  Oral care, Suction PRN  Aspiration Precautions posted at bedside: None      Follow up treatments: diet tolerance monitoring;strategies training  Recommendation Discussed With: : Patient;Caregiver;Nurse     SLP Frequency Recommended: 5x per week    Discharge recommendations: VFSS  Assessment:   Patient presents with evidence of mod-sev dysphagia s/p C3-C5 surgery. Patient swallows multiple times per bolus and c/o pills getting stuck; suspect pharyngeal residue contributing to dysphagia. Pt with throat clear or delayed cough response on all consistencies presented. SLP recommends NPO except meds crushed in applesauce as tolerated, with VFSS to be completed 3/14am for further recommendations.     History of Present Illness:   Rickie Gange III is a 70 y.o. male admitted on 08/17/2015 with "Traumatic cervicothoracic epidural hematoma s/p C3-C5 + T3-T4 decompressive laminectomies 2/27 by Dr. Jaynie Collins... s/p decompression, anterior and posterior cervical fusion by Dr. Jaynie Collins on 08/20/15"    Medical Diagnosis: Quadriplegia [G82.50]  Arteriovenous fistula of spinal cord vessels [Q28.8]  AVF (arteriovenous fistula) [I77.0]  Quadriplegia [G82.50]    Therapy Diagnosis: dysphagia    Past Medical/Surgical History:   Past Medical History   Diagnosis Date   . Hypertension    . Arthritis    . Pericarditis 1975   . Atrial fibrillation    . Arrhythmia       Past Surgical History   Procedure Laterality Date   . Vein surgery     . Colonoscopy       x2   . 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 Eye surgery  1960     Rt eye removed   . Decompression, anterior cervical, fusion, levels 2 N/A 08/20/2015     Procedure: DECOMPRESSION, ANTERIOR CERVICAL, FUSION, LEVELS 2;  Surgeon: Madaline Savage, MD;  Location: [facility];  Service: Neurosurgery;  Laterality: N/A;  C5-C7 ACDF; AVM REMOVAL   . Decompression, posterior cervical, fusion, levels 2 N/A 08/20/2015     Procedure: DECOMPRESSION, POSTERIOR CERVICAL, FUSION, LEVELS 2;  Surgeon: Madaline Savage, MD;  Location: [facility];  Service: Neurosurgery;  Laterality: N/A;      History/Current Status:  Respiratory Status:  (Possible aspiration PNA)  Behavior/Mental Status: Awake/alert;Able to follow directions;Cooperative;Pleasant mood  Nutrition: oral  Diet Prior to Study: regular;thin liquids    Subjective:   Patient is agreeable to participation in the therapy session. Family and/or guardian are agreeable to patient's participation in the therapy session. Nursing clears patient for therapy. Patient's medical condition is appropriate for Speech therapy intervention at this time.    PAIN: Denies  Objective:   Observation of Patient/Vital Signs:  Patient is in bed with dressings, telemetry, soft collar and SCD's in place.    Patient left with call bell within reach, all needs met, SCDs in place, fall mat in place, bed alarm activated and all questions answered. RN notified of  session outcome and patient response.     Oral Motor Skills:  Engineer, maintenance (IT) Skills: exceptions to [facility]  Oral Motor Impairments: ROM    Deglutition Skills:  Deglutition Skills  Position: upright 90 degrees  Food(s) Tested: ice chips;thin liquid;nectar thick liquid;puree  Oral Stage: adequate  Pharyngeal Stage: reduced laryngeal elevation;multiple swallows per bolus;throat clearing;c/o food sticking in  throat;delayed strong cough    Goals:  Patient will participate in VFSS.    Matt Marvie Calender MS, CCC-SLP     Time of Treatment:  SLP Received On: 08/24/15  Start Time: 0910  Stop Time: 0935  Time Calculation (min): 25 min

## 2015-08-24 NOTE — Plan of Care (Signed)
SLP recommends NPO until VFSS in AM.  Meds in applesauce ok per recommendations.  Will start on D5W-1/2 NS to avoid hypoglycemia.

## 2015-08-24 NOTE — Progress Notes (Addendum)
SHIFT OVERVIEW: No overnight shift events. MS unchanged until 06:00 when patient was not oriented to time: BUE able to resist +3/5, BLE able to overcome gravity +2/5, a&o x4, MAE & FC as able otherwise. STM loss noted. TMax 101.01 - PRN Tylenol given. Soft C-collar in place throughout the night. Maintained SpO2 > 92% on RA w/ no desats, HR rate-controlled AFib < 100 bpm, BP WNL. PRN Oxycodone given for R shoulder pain w/ less than desired effect. Coughed w/ swallows - pills given orally but w/ frequent sips, frequent oral suctioning required - scopolamine patch order obtained. AUOP to [facility] (in place 2/2 nerve damage), no BM overnight. No LWC required. See doc flows & physician's orders for shift details.    SAFETY: This RN verified patient's armband prior to rendering care. Intentional hourly rounding performed, I/O's collected, aspiration precautions observed.   Fall mats @ bedside x2.   Bed exit alarm activated   Patient able to verbalize needs appropriately.   CHG bath provided.   SCDs on.   Soft C-collar maintained.   Turned q2.   FC in place for nerve damage.   FC care completed.    PLAN: Awaiting appropriate rehab selection & placement.   Also awaiting OT recs.   OT/PT/SLP.   Pulmonary toilet.   Neuro checks q2.

## 2015-08-24 NOTE — Consults (Signed)
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   Urinary retention, required reinsertion of foley.     Febrile overnight.   Possible aspiration episodes.   Leukocytosis.         Plan:   1. SIRS.   Most probably due to aspiration given history.   I would follow Blood cx and Ucx and xray result.   Obtain sputum cx.     I would place him on zosyn for now.       2. Leukocytosis.   Due to above.   Monitor.     3. Aspiration precaution.   Plan is video swallow study tomorrow.     4. Monitor surgical scar.       Thanks for involving [place] in his care, we will follow with you closely.  Discussed with patient and family.   Discussed with nursing.  Discussed with Dr. Wynetta Fines.   Garth Bigness MD  Infectious Diseases   [phone]  [phone]    History:   Ernest Diaz is a 70 y.o. male with HTN, A fib, who originally presented to OSH after a fall and original CT was negative and he was discharged.   Then then developed paraparesis and MRI showed epidural hematoma extending from the foramen magnum to the upper thoracic spine. He underwent  C3-C5 and T3-T4 decompressive laminectomies on 2/27 by Dr. Jaynie Collins. 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  (findings suspicious for a disc ligamentous injury)    He then underwent decompression with ant and post cervical fusion and resection of dural AV fistula on 3/9.     He also noted to have urinary retention after foley removal and it was replaced 3/10     He was cleared for discharge on 3/11.     Overnight he has become febrile and has had low grade fever.     Family is at bedside and provided part of the history.   Per family, he started coughing over the weekend. He denies SOB.    He also states that his appetite is poor as food/especially liquid makes him nauseous and it is hard to swallow.     No diarrhea.     ID consult has been called to help with management of SIRS .        Past Medical History:     Past Medical History   Diagnosis Date   . Hypertension    . Arthritis    . Pericarditis 1975   . Atrial fibrillation    . Arrhythmia        Past Surgical History:     Past Surgical History  Procedure Laterality Date   . Vein surgery     . Colonoscopy       x2   . 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 Eye surgery  1960     Rt eye removed   . Decompression, anterior cervical, fusion, levels 2 N/A 08/20/2015     Procedure: DECOMPRESSION, ANTERIOR CERVICAL, FUSION, LEVELS 2;  Surgeon: Madaline Savage, MD;  Location: [place] [facility];  Service: Neurosurgery;  Laterality: N/A;  C5-C7 ACDF; AVM REMOVAL   . Decompression, posterior cervical, fusion, levels 2 N/A 08/20/2015     Procedure: DECOMPRESSION, POSTERIOR CERVICAL, FUSION, LEVELS 2;  Surgeon: Madaline Savage, MD;  Location: [place] [facility];  Service: Neurosurgery;  Laterality: N/A;       Family History:     Family History   Problem Relation Age of Onset   . Intracerebral hemorrhage Neg Hx    . Stroke Neg Hx        Social History:     Social History     Social History   . Marital Status: Married     Spouse Name: N/A   .  Number of Children: N/A   . Years of Education: N/A     Social History Main Topics   . Smoking status: Former Smoker     Quit date: 03/07/1986   . Smokeless tobacco: Never Used   . Alcohol Use: 1.2 - 1.8 oz/week     2-3 Shots of liquor per week      Comment: daily   . Drug Use: No   . Sexual Activity: Not on file     Other Topics Concern   . Not on file     Social History Narrative       Allergies:     Allergies   Allergen Reactions   . Sulfa Antibiotics Other (See Comments)     Lip swelling       Medications:     Current Facility-Administered Medications   Medication Dose Route Frequency   . amitriptyline  10 mg Oral QHS   . amLODIPine  10 mg Oral Daily   . atenolol  25 mg Oral Daily   . enoxaparin  40 mg Subcutaneous Daily   . finasteride  5 mg Oral QHS   . oxyCODONE  10 mg Oral Q12H   . scopolamine  1 patch Transdermal Q72H   . senna-docusate  1 tablet Oral BID   . triamterene-hydrochlorothiazide  1 tablet Oral Daily       Review of Systems:   A comprehensive review of systems was done  Fever +  Weak +  Shoulder pain +  No headache, no dizziness  Some issues with swallowing   Cough +, no SOB  Appetite poor   Constipation     Physical Exam:     Filed Vitals:    08/24/15 0742   BP:    Pulse:    Temp: 99 F (37.2 C)   Resp:    SpO2:      General: in bed, in [facility], awake and communicative, on RAfamily at bedside , nurse at bedside   HEENT: moist mucosa , not pale, not icteric , cervical collar in place  Scar on the neck with slight redness on upper part , no drainage   Chest: S1s2 irregular ,  coarse BS, no wheeze   Abdomen:soft, not tender , BS +  Ext: no edema     Foley in place    PIV access     Intake and Output Summary (Last 24 hours) at Date Time    Intake/Output Summary (Last 24 hours) at 08/24/15 0836  Last data filed at 08/24/15 0743   Gross per 24 hour   Intake    240 ml   Output   1768 ml   Net  -1528 ml           Labs Reviewed:     Results     ** No results found for the last 24 hours. **              Rads:    Radiological Procedure reviewed.     Signed by: Wayland Salinas

## 2015-08-24 NOTE — OT Progress Note (Signed)
Occupational Therapy Cancellation Note    Patient: Ernest Diaz  WUJ:[id number]    Unit: G956/O130.86    Patient not seen for occupational therapy secondary to pt currently having blood drawn and then being seen by Speech therapy. Will f/u as schedule permits.    Thank you,  Caleen Jobs. Victorio Palm, pager# [phone]    08/24/2015  Time:  09:05 am

## 2015-08-24 NOTE — Progress Notes (Addendum)
MEDICINE PROGRESS NOTE    Date Time: 08/24/2015 8:34 AM  Patient Name: Ernest Diaz  Attending Physician: Isaias Cowman, MD    Assessment:   Principal Problem:    Thoracic spinal AV fistula  Active Problems:    Quadriplegia    Atrial fibrillation  Resolved Problems:    * No resolved hospital problems. *      Plan:   1. Fevers- Patient with recurrent fevers overnight, Tmax of 101 F  - Concern for aspiration PNA given copious secretions noted, SLP consult placed  - Scopolamine patch for secretions     - Will obtain CXR and sputum cx  - Encouraged usage of IS  - Will recheck CBC, mild leukocytosis on prior labwork  - Will check UA, given patient has been in hospital for 7 days and recent surgical intervention will also need to check blood cultures  - ID consulted, discussed with Dr. Rayetta Humphrey, given new and recurrent fevers and recent surgery recommends continued monitoring and evaluation as inpatient, therefore will d/c discharge order     - Depending on results of CXR and sputum cx, may need empiric Clindamycin for aspiration PNA coverage    2. Traumatic cervicothoracic epidural hematoma s/p C3-C5 + T3-T4 decompressive laminectomies 2/27 by Dr. 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 Jaynie Collins on 08/20/15  - Neuro checks q 2 h  - Hold AC and antiplatelets per NSGY, will need clearance from Dr. Jaynie Collins prior to restarting  - Pain control with Oxycontin and prn Oxycodone, bowel regimen while on narcotics      3. Hx A Fib on Eliquis, CHADS2VASC of 2  -Rate controlled on Atenolol, bradycardia improved after decreasing dose to 25 mg q day   -Patient asymptomatic with appropriate chronotropic response  -Hold AC and antiplatelets given procedure at this time  -Discussed with patient, aware of increased risk of  CVA without AC or antiplatelets however also understands contraindication from NSGY perspective given risk of bleeding into spine, will need clearance from Dr. Jaynie Collins prior to restarting     4. HTN - No acute issues  - Continue Norvasc  - Decreased Atenolol as above due to bradycardia    - Continue Maxzide   - Goal < 150/90 as outpatient     5. Urinary Retention- Failed voiding trial   - Bladder scan without Foley > 400 cc, with Foley showed 80 cc  - Likely due to recent neurosurgical procedure  - Unable to have Flomax due to sulfa allergy, started on Proscar   - Will need outpatient voiding trials at SNF    DVT/GI prophylaxis - Lovenox    Case discussed with: Patient, RN, NSGY, ID    Safety Checklist:     DVT prophylaxis:  CHEST guideline (See page [id number]) Chemical    Foley:  [place] Rn Foley protocol Not present   IVs:  Peripheral IV   PT/OT: Ordered   Daily CBC & or Chem ordered:  SHM/ABIM guidelines (see #5) Yes, due to clinical and lab instability   Reference for approximate charges of common labs: CBC auto diff - $76  BMP - $99  Mg - $79    Lines:     Patient Lines/Drains/Airways Status    Active PICC Line / CVC Line /  PIV Line / Drain / Airway / Intraosseous Line / Epidural Line / ART Line / Line / Wound / Pressure Ulcer / NG/OG Tube     Name:   Placement date:   Placement time:   Site:   Days:    Peripheral IV Right Antecubital        Antecubital       Peripheral IV 08/17/15 Left;Lateral Antecubital  08/17/15   1723   Antecubital   less than 1    Urethral Catheter               Incision Site 08/17/15 Back Upper  08/17/15   1721     less than 1                 Disposition: (Please see PAF column for Expected D/C Date)   Today's date: 08/24/2015  Admit Date: 08/17/2015  4:29 PM  LOS: 7  Clinical Milestones: Infectious workup   Anticipated discharge needs: Per PT/OT recs      Subjective     CC: Arteriovenous fistula of spinal cord vessels    Interval History/24 hour events: Patient recurrently febrile overnight.   Copious secretions noted.  Denies chest pain, shortness of breath, abdominal pain.  Reports difficulty with handling secretions however.  Reports he feels feverish.  Denies chills.  Plan of care discussed with family at bedside.      Review of Systems:     Review of Systems - Negative except as noted above in HPI      Physical Exam:     VITAL SIGNS PHYSICAL EXAM   Temp:  [97.9 F (36.6 C)-100.6 F (38.1 C)] 99 F (37.2 C)  Heart Rate:  [59-77] 70  Resp Rate:  [9-20] 16  BP: (95-136)/(48-84) 106/70 mmHg      Intake/Output Summary (Last 24 hours) at 08/24/15 0834  Last data filed at 08/24/15 0743   Gross per 24 hour   Intake    240 ml   Output   1768 ml   Net  -1528 ml    Physical Exam  General: awake, alert X 3  Neck: Aspen collar in place  Cardiovascular: irregularly irregular,  no murmurs, rubs or gallops  Lungs: coarse breath sounds bilaterally, no wheezing, no rales, mild ronchi  Abdomen: soft, non-tender, non-distended; no palpable masses,  normoactive bowel sounds, no rebound, no guarding  Extremities: no edema  Neuro: CN grossly intact, speech fluent, follows commands, 4/5 b/l UE and LE strength        Meds:     Medications were reviewed:    Labs:     Labs (last 72 hours):      Recent Labs  Lab 08/22/15  0355 08/20/15  0140   WBC 11.75* 12.09*   HGB 11.7* 13.5   HEMATOCRIT 36.2* 40.3*   PLATELETS 164 210         Recent Labs  Lab 08/20/15  0140 08/17/15  1721   PT 14.2 13.8   PT INR 1.1 1.1   PTT  --  24      Recent Labs  Lab 08/22/15  0355 08/20/15  0140   SODIUM 137 135*   POTASSIUM 3.9 3.9   CHLORIDE 100 100   CO2 30* 26   BUN 18.0 23.0   CREATININE 0.6* 0.6*   CALCIUM 8.2 8.3   ALBUMIN 2.8* 2.4*   PROTEIN, TOTAL 5.3* 5.2*   BILIRUBIN, TOTAL 0.7 0.5   ALKALINE PHOSPHATASE 62 66  ALT 25 61*   AST (SGOT) 21 36*   GLUCOSE 93 113*                   Microbiology, reviewed and are significant for:  Microbiology Results     Procedure Component Value Units Date/Time    MRSA culture [[id number]] Collected:   08/17/15 2043    Specimen Information:  Body Fluid from Nares and Throat Updated:  08/18/15 0303            Imaging, reviewed and are significant for:  No results found.        Signed by: Isaias Cowman, MD

## 2015-08-24 NOTE — OT Eval Note (Signed)
[facility]   Occupational Therapy Evaluation     Patient: Ernest Diaz    MRN#: [id number]   Unit: [facility]  Bed: F432/F432.01                                     Discharge Recommendations:   Discharge Recommendation: SNF   DME Recommended for Discharge:  (TBD at SNF)    If SNF recommended discharge disposition is not available, patient will need 2-person total assist for mobility and max-total assist for ADLs/IADLs, hospital bed, hoyer lift, w/c, bsc, stretcher transport equipment, and HHOT.     Discharge recommendations are subject to change based on patient's progress. Please refer to the most recent treatment note for the most up to date recommendation.  Assessment:   Ernest Diaz is a 70 y.o. male admitted 08/17/2015 from OSH after fall then s/p C3-C5 and T3-T4 decompressive laminectomies 2/27. MD noted extensive dorsal epidural venous plexus and when he dissected out these vessels they bled. Pt was [facility] to [facility] for spinal angiogram. Pt now s/p decompression, anterior and posterior cervical fusion by Dr. Jaynie Collins on 3/9. Pt presented with some confusion; able to follow simple one step commands with mod t/v cueing and repetition. Max assist x2-dependent x2 to transfer supine<>sit and min-max assist to maintain sitting balance at EOB. Neuro re-ed tasks completed at EOB to increase postural control. No trunk initiation noted to maintain sitting balance. Max assist-Dependent for ADLs. Pt presents with decreased strength/endurance, negatively impacting pt's ability to complete ADLs/IADLs/functional transfers at baseline level. Pt will benefit from skilled OT to maximize independence with ADL performance.    Therapy Diagnosis:  Decreased independence with ADL performance    Rehabilitation Potential: good for stated goals    Treatment Activities: OT Eval, Modified ADL retraining, functional transfer training to sit EOB, modified bed mobility  training, education on energy conservation/fall prevention techniques, compensatory technique education, neuro re-ed, UE strengthening, endurance training, Relaxation/pain management techniques, family training, Education on medical equipment beneficial for increasing ADL performance and safety in home environment.    Educated the patient to role of occupational therapy, plan of care, goals of therapy and HEP, safety with mobility and ADLs, energy conservation techniques, home safety.    Plan:   OT Frequency Recommended: 2-3x/wk       Treatment/Interventions: ADL retraining, functional transfer training, bed mobility training, education on energy conservation/fall prevention techniques, compensatory technique education, UE strengthening, endurance training, Relaxation/pain management techniques, Education on car and shower transfers, Education on medical equipment beneficial for increasing ADL performance and safety in home environment.      Risks/benefits/POC discussed with pt.       Precautions and Contraindications:   Falls  C-spine precautions  Soft collar in bed, Aspen collar for ambulation    Consult received for [name] for OT Evaluation and Treatment.  Patient's medical condition is appropriate for Occupational Therapy intervention at this time.      History of Present Illness:    Ernest Diaz is a 70 y.o. male admitted on 08/17/2015 with "hx afib on Eliquis + aspirin, HTN who presented to [facility] 2/25 after a fall. CT head and spine were negative so he was discharged. 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 MRI cervical and thoracic spine showed spinal epidural hematoma extending from the foramen  magnum to the upper thoracic spine. Neurosurgery (Dr. Jaynie Collins) was consulted. His Eliquis was reversed. He ultimately had C3-C5 and T3-T4 decompressive laminectomies 2/27 by Dr. Jaynie Collins. He noted extensive dorsal epidural venous plexus and when he dissected out  these vessels they bled. Dr. Jaynie Collins requested he be transferred here for spinal angiogram. These symptoms are sudden onset, severe intensity, without alleviating factors." -per H&P    POSTOPERATIVE DIAGNOSIS:  1.  Spinal cord dural AV fistula.  2.  C6-C7 fracture.      TITLE OF PROCEDURE:  1.  C6-C7 anterior cervical diskectomy for decompression of central canal  and both proximal foramen.  2.  Arthrodesis at C6-C7 with placement of a PEEK cage using the Glob[employer]  coalition spacer with vertebral body screws at C6-C7.  3.  Posterior C6-C7 laminectomy for resection of a ** AV fistula.  4.  Posterior cervical fusion at C6-C7.  5.  Placement of lateral mass screws from C6 to C7 using the Globus system.  6.  Intraoperative microscope for microsurgical dissection.  7.  Intraoperative fluoroscopy lasting greater than 1 hour fro localization  and hardware placement.           Admitting Diagnosis: Quadriplegia [G82.50]  Arteriovenous fistula of spinal cord vessels [Q28.8]  AVF (arteriovenous fistula) [I77.0]  Quadriplegia [G82.50]    Past Medical/Surgical History:  Past Medical History   Diagnosis Date   . Hypertension    . Arthritis    . Pericarditis 1975   . Atrial fibrillation    . Arrhythmia       Past Surgical History   Procedure Laterality Date   . Vein surgery     . Colonoscopy       x2   . 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 Eye surgery  1960     Rt eye removed   . Decompression, anterior cervical, fusion, levels 2 N/A 08/20/2015     Procedure: DECOMPRESSION, ANTERIOR CERVICAL, FUSION, LEVELS 2;  Surgeon: Madaline Savage, MD;  Location: [place] [facility];  Service: Neurosurgery;  Laterality: N/A;  C5-C7 ACDF; AVM REMOVAL   . Decompression, posterior cervical, fusion, levels 2 N/A 08/20/2015     Procedure: DECOMPRESSION, POSTERIOR CERVICAL, FUSION, LEVELS 2;  Surgeon: Madaline Savage, MD;  Location: [place] [facility];  Service: Neurosurgery;  Laterality: N/A;         Imaging/Tests/Labs:  MRI Cervical Spine W WO Contrast    Impression:        Postsurgical change with prior laminectomy. 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 There  findings suspicious for a disc ligamentous injury at this level.  Correlation with CT is advised.      Social History:   Prior Level of Function: Independent with ADLs/functional transfers  Assistive Devices: none  Baseline Activity: community ambulation   DME Currently at Home: none  Home Living Arrangements: lives with wife  Type of Home: house  Home Layout: stairs    Subjective: "No pain right now"    Patient is agreeable to participation in the therapy session. Nursing clears patient for therapy.     Patient Goal: go home  Pain:   Scale: denied pain throughout session  Location:   Intervention:     Objective:   Patient received  in bed with [facility] monitors, PIV  access, soft c-collar donned, surgical dressing, foley, SCDs, bed alarm, fall mat in place.    Cognitive Status:  A&Ox3, some confusion, able to follow simple one step commands with mod t/v cueing and repetition, pleasant and cooperative    Musculoskeletal Examination  Gross ROM:   RUE ROM: WFL within precautions  LUE ROM: WFL within precautions  RLE ROM: grossly assessed: WFL  LLE ROM: grossly assessed: WFL     Gross Strength:  RUE Strength: shoulder flex: 3-/5, shoulder ext: 3-/5, biceps: 3/5, triceps: 3/5, grip: 3-/5  LUE Strength: shoulder flex: 3-/5, shoulder ext: 3-/5, biceps: 3/5, triceps: 3/5, grip: 3-/5  RLE Strength: grossly assessed: at least 2+/5  LLE Strength: grossly assessed: at least 2+/5      Sensory/Oculomotor Examination  Auditory: WFL  Tactile: denied numbness/tingling  Vision: no change in L eye; h/o blindness in R eye    Neuro Examination  Coordination: decreased    Activities of Daily Living  Eating: NT  Grooming: max assist  Bathing: mod-max  assist to wash B knees with facecloth  UE Dressing: max assist  LE Dressing: max assist-dependent  Toileting: NT    Functional Mobility:  Rolling: max assist x2  Supine to Sit: max assist x2-dependent x2  Scooting: max assist x2-dependent x2  Sit to Supine: max assist x2-dependent x2  Sit to Stand: NT  Transfers: NT     Balance  Static Sitting: max assist with periods of min-mod assist  Dynamic Sitting: dependent  Static Standing: NT  Dynamic Standing: NT    Participation and Activity Tolerance  Participation Effort: good  Endurance: fair+ (VSS)    Patient left with call bell within reach, all needs met, SCDs replaced on pt, fall mat in place, bed alarm engaged in reclined bed-chair position, chair alarm n/a and all questions answered. RN notified of session outcome and patient response.       Goals:  Time For Goal Achievement: 5 visits  ADL Goals  Patient will groom self: Minimal Assist  Patient will dress upper body: Moderate Assist (with modified techniques)  Patient will dress lower body: Moderate Assist, with AE  Pt will complete bathing: Moderate Assist  Patient will toilet: Moderate Assist  Mobility and Transfer Goals  Pt will perform functional transfers: Moderate Assist  Neuro Re-Ed Goals  Pt will perform dynamic sitting balance: Minimal Assist, to increase ability to complete ADLs  Pt will sit at edge of bed: Stand by Assist, to prepare for OOB tasks  Musculoskeletal Goals  Pt will perform Home Exercise Program: stand by assist, with caregiver/family assist, to increase engagement in ADLs                   Caleen Jobs. Kamani Lewter, OTR/L, pager# [phone]      Time of treatment:   OT Received On: 08/24/15  Start Time: 0915  Stop Time: 1025  Time Calculation (min): 70 min

## 2015-08-24 NOTE — Plan of Care (Addendum)
Please refer to flowsheets for vitals and trends.  Pt's neuro status unchanged from previous shifts.  Pt continues to have right shoulder, neck, and back pain.  PRN oxycodone given Pt started on BID scheduled oxycodone in afternoon after PRNs given 3xs.  Pt remains in A-fib with HR 50-70, BP stable.  Last night pt spiked 100 degree temp from foley catheter.  Blood cultures and urine cultures collected and sent to lab.  Unable to gather respiratory sample.  Zosyn started. Pt breathing easily on RA, but with persistent productive cough.  Speech saw pt and noted dysphagia.  Diet changed to NPO except crushed meds with apple sauce.  Video swallow tomorrow. Maintenance drip started.  AUO from foley.  No BM this shift. Pt updated and support given.

## 2015-08-25 ENCOUNTER — Inpatient Hospital Stay: Payer: Medicare Other

## 2015-08-25 LAB — CBC
Hematocrit: 36.7 % — ABNORMAL LOW (ref 42.0–52.0)
Hgb: 12.1 g/dL — ABNORMAL LOW (ref 13.0–17.0)
MCH: 33 pg — ABNORMAL HIGH (ref 28.0–32.0)
MCHC: 33 g/dL (ref 32.0–36.0)
MCV: 100 fL (ref 80.0–100.0)
MPV: 10.7 fL (ref 9.4–12.3)
Nucleated RBC: 0 /100 WBC (ref 0–1)
Platelets: 167 10*3/uL (ref 140–400)
RBC: 3.67 10*6/uL — ABNORMAL LOW (ref 4.70–6.00)
RDW: 14 % (ref 12–15)
WBC: 10.64 10*3/uL (ref 3.50–10.80)

## 2015-08-25 LAB — BASIC METABOLIC PANEL
BUN: 10 mg/dL (ref 9.0–28.0)
CO2: 27 mEq/L (ref 22–29)
Calcium: 7.8 mg/dL — ABNORMAL LOW (ref 7.9–10.2)
Chloride: 98 mEq/L — ABNORMAL LOW (ref 100–111)
Creatinine: 0.6 mg/dL — ABNORMAL LOW (ref 0.7–1.3)
Glucose: 95 mg/dL (ref 70–100)
Potassium: 4.8 mEq/L (ref 3.5–5.1)
Sodium: 133 mEq/L — ABNORMAL LOW (ref 136–145)

## 2015-08-25 LAB — GFR: EGFR: >60

## 2015-08-25 MED ORDER — BISACODYL 10 MG RE SUPP
10.0000 mg | Freq: Every day | RECTAL | Status: DC | PRN
Start: 2015-08-25 — End: 2015-09-01
  Administered 2015-08-25: 10 mg via RECTAL
  Filled 2015-08-25: qty 1

## 2015-08-25 MED ORDER — BARIUM SULFATE 40 % PO SUSR
80.0000 mL | Freq: Once | ORAL | Status: AC | PRN
Start: 2015-08-25 — End: 2015-08-25
  Administered 2015-08-25: 80 mL via ORAL
  Filled 2015-08-25: qty 80

## 2015-08-25 MED ORDER — POLYETHYLENE GLYCOL 3350 17 G PO PACK
17.0000 g | PACK | Freq: Every day | ORAL | Status: DC
Start: 2015-08-25 — End: 2015-09-09
  Administered 2015-08-26 – 2015-09-08 (×6): 17 g via ORAL
  Filled 2015-08-25 (×7): qty 1

## 2015-08-25 MED ORDER — BARIUM SULFATE 40 % PO PSTE
20.0000 mL | PASTE | Freq: Once | ORAL | Status: AC | PRN
Start: 2015-08-25 — End: 2015-08-25
  Administered 2015-08-25: 20 mL via ORAL
  Filled 2015-08-25: qty 20

## 2015-08-25 NOTE — SLP Progress Note (Signed)
[facility]   SLP Treatment Note  Patient: Ernest Diaz    MRN#: [id number]     Treatment Type: dysphagia f/u    Recommendations/Plan:   - Diet Recommendations: mechanically altered solids and thin liquids per VFSS completed this morning.   - Aspiration Precautions: Sitting upright, small single bites/sips and slow intake rate, two swallows per bolus, alternate solids/liquids, throat clear during meals    SLP Frequency Recommended: 3x per week    Discharge recommendations: Rehab consult    Assessment:   Patient presents with mild-mod oropharyngeal dysphagia on VFSS completed this morning. See report for details. Patient provided education and aspiration precautions. SLP will continue to f/u at bedside.   Subjective:   Pain: None; odynophagia.   Current Diet:  MA/thin  Respiratory Status:  Hx PNA  Precautions:  Fall, aspiration, c-collar  Patient seen in room with RN present. Patient left with call bell within reach, all needs met, SCDs in place, fall mat in place, bed alarm activated and all questions answered.   Objective:   Objective:   Patient accepted pureed solids (with crushed meds given by RN). Pt used multiple swallows and throat clear. No s/s aspiration.  Patient provided written aspiration precautions (posted at bedside) and educated regarding swallowing strategies to follow during meals.     Patient Education:  Verbal and written; pt expressed understanding and agreement    Goals:   Patient will tolerate mechanically altered solids and thin liquids without s/s aspiration x24-48 hours.     Matt Raghad Lorenz MS, CCC-SLP     Time of Treatment:  SLP Received On: 08/25/15  Start Time: 1015  Stop Time: 1030  Time Calculation (min): 15 min

## 2015-08-25 NOTE — PT Progress Note (Signed)
[facility]   Physical Therapy Treatment  Patient: Ernest Diaz     MRN#: [id number]  Unit: [facility] T[facility] 4 Bed: F432/F432.01    Discharge Recommendations:   Discharge Recommendation: SNF     DME Recommendation: TBD at rehab      If SNF recommended discharge disposition is not available, patient will need max A x 2 for functional mobility, hospital bed, hoyer lift, WC, BSC, tub bench, stretcher transport, and HHPT.        Recommendations can change; please see most recent physical therapy treatment note for updates.      Assessment:   Pt able to transfer to EOB with assist x 2, takes increased time to find COG over BOS; only able to briefly hold, as pt fatigues quickly, and has slow lean, either to R or forward. Pt has hands to side, but doesn't activate UE muscles well to catch himself. Pt fearful of falling in multi-directions; even when hands on pt and pt not leaning. Pt educated on HEP with therapeutic exercise in bed; activating distal muscles better than proximal hip musculature. Pt able to perform UE therapeutic exercise, stronger at elbows than in shoulders and hands; unable to extend digits.     Treatment Activities: neuromuscular re-education, therapeutic exercise     Educated the patient to role of physical therapy, plan of care, goals of therapy and HEP, safety with mobility and ADLs.    Plan:   PT Frequency: 2-3x/wk    Treatment/Interventions: Gait training, transfers, bed mobility, therapeutic activities, therapeutic exercises, neuromuscular re-education, functional mobility, endurance/activity tolerance, patient education and safety     Continue plan of care.       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 No infiltrate.  2. Mild lingular scarring versus atelectasis.   Collene Schlichter, MD  08/24/2015 11:00 AM     Lab Results   Component Value Date/Time    HGB 12.1* 08/25/2015 03:42 AM    HEMATOCRIT 36.7* 08/25/2015 03:42 AM    POTASSIUM 4.8 08/25/2015 03:42 AM    SODIUM 133* 08/25/2015 03:42 AM    PT INR 1.1 08/20/2015 01:40 AM     Subjective:    Patient is agreeable to participation in the therapy session. Nursing clears patient for therapy.     Pt reports "I think I'm falling!"    Pain:   Scale: 5/10  Location: initially L shoulder at start of session, then R shoulder at end of session  Intervention: meds per nursing, positioning    Objective:   Patient presents in bed with telemetry, SCD's, peripheral IV and indwelling urinary catheter, soft c-collar, floor mats, bed alarm in place, and no family present at this time.    Cognitive  Patient is alert and oriented x 3; pleasant and cooperative.     Functional Mobility  Rolling: max A x 2  Supine to Sit: max-total A x 2  Sit to Supine: total-dep A x 2  Scooting: dep A x 2 to HOB  Sit to Stand: NT  Stand to Sit: NT  Transfers: supine/sit only    Ambulation  Weightbearing Status: no restrictions  Device Used: NT  Level of assistance required: NT  Ambulation Distance: NT  Pattern: NT  Stair Management: NT     Balance  Static Sitting: poor(+) to fair(-)  Dynamic Sitting: poor(+); attempted weight shifting L/R, forward/backward with assist  Static Standing: NT  Dynamic Standing: NT    Therapeutic Exercises  Propping on elbows to L, R with push-up  Attempted forward propping on thighs, but pt unable to hold due to falling forward    Supine:  Quad sets  Ankle pumps  Heel slides  Gluteal sets (1/5 MMT palpated)    Sitting:  LAQs  Elbow flexion  Hand grip  Shoulder shrugs  Shoulder retractions with max TCs    Participation Effort  good    Endurance  Fair; VSS    Pt left in bed/chair position with needs met with RN informed of  status as well as outcome of PT session.    Patient left with call bell and phone within reach, SCDs, fall mat, chair alarm in place, all needs met and all questions answered.      Goals:    Goals  Goal Formulation: With patient  Time for Goal Acheivement: 7 visits  Goals: Select goal  Pt Will Go Supine To Sit: with moderate assist  Pt Will Perform Sit To Supine: with moderate assist  Pt Will Sit Edge of Bed: 6-10 min, with moderate assist  Pt Will Achieve Sitting Balance: 2/5 supports self independently with both UEs  Pt Will Perform Home Exer Program: with minimal assist       Elliot Dally, DPT 5:25 PM 08/25/2015  Pager [phone]       Time of treatment:   PT Received On: 08/25/15  Start Time: 1625  Stop Time: 1720  Time Calculation (min): 55 min

## 2015-08-25 NOTE — Progress Notes (Signed)
ID PROGRESS NOTE    Date Time: 08/25/2015 8:04 AM  Patient Name: [facility]        Subjective, ROS:    Overnight still with low grade fever   Leukocytosis resolved     Alone in the room     States that he is feeling better  Wants to be repositioned   No headache  States that cough better   NPO  No N/V  Foley in place   Pain in lower back      Antibiotics and culture results:   Antibiotics: zosyn #2      Cultures:  3/`3 Ucx P/UA pyuria   3/13 Blood cx P  3/6 MRSA P    3/9 path FIBROVASCULAR TISSUE, FEW VESSELS AND BONE CHIPS       Lines: PIV access    Physical Exam:     Filed Vitals:    08/25/15 0600   BP: 124/60   Pulse: 71   Temp: 100.8 F (38.2 C)   Resp: 14   SpO2: 95%       General: in bed, in [facility], awake and communicative, no distress, no cough when i was in the room  HEENT: slightly dry mucosa , not pale, not icteric , cervical collar in place  Scar on the neck with slight redness on upper part , no drainage (examiend yesterday)  Chest: S1s2 irregular , coarse BS, no wheeze   Abdomen:soft, not tender , BS +  Ext: no edema   Foley in place       Labs:       Recent CBC WITH DIFF   Recent Labs      08/25/15   0342   WBC  10.64   RBC  3.67*   HGB  12.1*   HEMATOCRIT  36.7*   MCV  100.0       Recent CMP   Recent Labs      08/25/15   0342   GLUCOSE  95   BUN  10.0   CREATININE  0.6*   SODIUM  133*   POTASSIUM  4.8   CHLORIDE  98*   CO2  27           Rads:     3/13 xray No infiltrate. Mild lingular scarring versus atelectasis.    3/8 MRI Postsurgical change with prior laminectomy. 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 There findings suspicious for a disc ligamentous injury at this level. Correlation with CT is advised    3/7 xray No acute process    3/7 IR No angiographic evidence of intracranial aneurysm, arteriovenous malformation or dural fistula. Is no angiographic evidence of spinal arteriovenous malformation or dural fistula.  20-30% stenosis of the  cervical left internal carotid arteries described.    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   Urinary retention, required reinsertion of foley.      Recent fever/SIRS on 3/12   Symptoms of aspiration with increased cough and mod-sev dysphagia   Leukocytosis    Plan:   1.  SIRS.   Most probably due to aspiration given history and given mod-sev dysphagia.  UA with pyuria but expected from foley.     Cultures are pending.   On zosyn.   Still with low grade fever but leukocytosis resolved.     Continue zosyn for now.   Follow cx result   Repeat xray tomorrow.     Aspiration precaution.    Also monitor surgical scar.     2. Leukocytosis.   Due to above.   Resolved.    Discussed with the patient.     Signed by: [place]. [id number]  Infectious Disease  Phone: [phone]  Fax: [phone]

## 2015-08-25 NOTE — Plan of Care (Signed)
Problem: Safety  Goal: Patient will be free from injury during hospitalization  Outcome: Progressing  Pt AO , FC and MAE. Remains on RA. VSS see flowsheet. Foley remains in place and no BM. Passed swallow eval, can swallow small pills whole. PRN oxycodone 15mg  x1. Will continue to monitor.

## 2015-08-25 NOTE — Progress Notes (Addendum)
SHIFT OVERVIEW: No overnight shift events. MS unchanged: a&o x3, BUE able to resist +3/5, BLE able to overcome gravity +2/5, FC as able. STM loss noted. TMax 100.8. PRN Tylenol & Oxycodone given for pain w/ intended effect. Soft C-collar in place throughout the night. Maintained SpO2 > 92% on RA w/ no desats, fewer secretions this evening, HR rate-controlled AFib < 100 bpm, BP WNL, still coughed w/ swallows, AUOP to FC (in place 2/2 nerve damage), no BM overnight - PRN Pericolace given. Gauze to cervical spine surgical site removed; steri-strips to thoracic surgical site C/D/I. IVFs infused per MAR. See doc flows & physician's orders for shift details.    SAFETY: This RN verified patient's armband prior to rendering care. Intentional hourly rounding performed, I/O's collected, aspiration precautions observed.   Fall mats @ bedside x2.   Bed exit alarm activated   Patient able to verbalize needs appropriately.   CHG bath provided.   SCDs on.   Soft C-collar maintained.   Turned q2.   FC in place for nerve damage.   FC care completed.   Next voiding trial as OP @ SNF.    PLAN: Neuro checks q2.   OT/PT/SLP.   Pulmonary toilet.   IV ABX.   Monitor fever curve.   Trend BCx & UA.   Still needs sputum culture.   Goal BP < 150/90 as OP.   Video swallow study in AM.   NPO until study completed.   D5 IVFs.

## 2015-08-25 NOTE — SLP Eval Note (Signed)
[facility]   Speech and Language Therapy Evaluation     Videofluoroscopic Swallowing Study Report    Patient: Ernest Diaz    MRN#: [id number]     Plan/Recommendations:   Diet/liquid recommendations: Mechanically altered solids and thin liquids  Mode for liquids: Small, single sips by straw; two swallows per sip  Suggestions for medication administration: Crushed in applesauce  Precautions/Compensations: Sitting upright, small single bites/sips and slow intake rate, two swallows per bolus, alternate solids/liquids, throat clear during meals  Suggestions for feeding: 1:1 assistance  Discussed with: Patient and nurse    SLP Frequency Recommended: 3x per week    Discharge recommendations: Rehab consult    Assessment:   Ernest Diaz is a 70 y.o. 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 Patient presents with mild pharyngeal edema and reduced hyolaryngeal excursion resulting in significant vallecular residue and trace penetration of thin liquids. Patient able to clear majority of vallecular residue with cued dry swallow, and clears penetrated material with spontaneous throat clear frequently throughout evaluation. No aspiration observed. SLP recommends mechanically altered solids and thin liquids with strict aspiration precautions.       Referring Physician: Dr. Wynetta Fines  Radiologist: Dr. 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 Hypertension    . Arthritis    . Pericarditis 1975   . Atrial fibrillation    .  Arrhythmia       Past Surgical History   Procedure Laterality Date   . Vein surgery     . Colonoscopy       x2   . 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 Eye surgery  1960     Rt eye removed   . Decompression, anterior cervical, fusion, levels 2 N/A 08/20/2015     Procedure: DECOMPRESSION, ANTERIOR CERVICAL, FUSION, LEVELS 2;  Surgeon: Madaline Savage, MD;  Location: [place] [facility];  Service: Neurosurgery;  Laterality: N/A;  C5-C7 ACDF; AVM REMOVAL   . Decompression, posterior cervical, fusion, levels 2 N/A 08/20/2015     Procedure: DECOMPRESSION, POSTERIOR CERVICAL, FUSION, LEVELS 2;  Surgeon: Madaline Savage, MD;  Location:  [facility] OR;  Service: Neurosurgery;  Laterality: N/A;      Pain: Denies; endorses odynophagia  Behavior/Mental Status: WFL  Risks/benefits of evaluation discussed with patient/caregiver? Yes  Respiratory Status: Hx PNA  Nutrition: NPO    Oral Motor Skills:    Impairments: ROM    VFSS Results - Clinical Observations:   Positioning: seated up right at 90 degrees  Plane of Reference: lateral    Presentations:   Liquids:   Thin: 80cc   Puree: 20cc   Solid: cookie     Oral Stage: Prolonged but effective mastication    Pharyngeal Stage: Timely pharyngeal swallow reflex. Reduced hyolaryngeal excursion but adequate epiglottic inversion. Reduced BOT retraction resulting in vallecular residue which was cleared with cued dry swallow. Adequate UES opening.  Esophageal Stage: NT    Airway Protection: No aspiration. Trace penetration of thin liquid residuals, which were cleared by spontaneous throat clear.     Compensatory Strategies Assessed: Throat clear effective in clearing penetrated material. Dry swallow and Alternation of solids and liquids effective in clearing residue.     Patient/Family Education: Verbal    Goals:  Patient will tolerate  mechanically altered solids and thin liquids without s/s aspiration x24-48 hours. new    Matt Avonna Iribe MS, CCC-SLP     Time of Treatment:  SLP Received On: 08/25/15  Start Time: 0915  Stop Time: 0940  Time Calculation (min): 25 min

## 2015-08-25 NOTE — Progress Notes (Signed)
MEDICINE PROGRESS NOTE    Date Time: 08/25/2015 4:13 PM  Patient Name: Ernest Diaz  Attending Physician: Les Pou, MD    Assessment:   Principal Problem:    Thoracic spinal AV fistula  Active Problems:    Quadriplegia    Atrial fibrillation  Resolved Problems:    * No resolved hospital problems. *      Plan:   1. Fevers- Patient with recurrent fevers Tmax of 101.3 F.  Leukocytosis resolved  - Concern for aspiration PNA given copious secretions  Vs. UTI (UA with pyuria, foley in place)  - Scopolamine patch for secretions     - ID consulted.  - follow cultures  - Cont Zosyn     2. Traumatic cervicothoracic epidural hematoma s/p C3-C5 + T3-T4 decompressive laminectomies 2/27 by Dr. 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 Jaynie Collins on 08/20/15  - Neuro checks q 2 h  - Hold AC and antiplatelets per NSGY, will need clearance from Dr. Jaynie Collins prior to restarting  - Pain control with Oxycontin and prn Oxycodone, bowel regimen while on narcotics      3. Hx A Fib on Eliquis, CHADS2VASC of 2  -Rate controlled on Atenolol, bradycardia improved after decreasing dose to 25 mg q day   -Patient asymptomatic with appropriate chronotropic response  -Hold AC and antiplatelets given procedure at this time  -Discussed with patient, aware of increased risk of CVA without AC or antiplatelets however also understands contraindication from NSGY perspective given risk of bleeding into spine, will need clearance from Dr. Jaynie Collins prior to restarting     4. HTN - No acute issues  - Continue Norvasc  - Decreased Atenolol as above due to bradycardia    - Continue Maxzide   - Goal < 150/90 as outpatient     5. Urinary Retention- Failed voiding trial   - Bladder scan without Foley > 400 cc, with Foley showed 80 cc  - Likely due to  recent neurosurgical procedure  - Unable to have Flomax due to sulfa allergy, started on Proscar   - Will need outpatient voiding trials at SNF    DVT/GI prophylaxis - Lovenox    Case discussed with: Patient, RN, NSGY, ID    Safety Checklist:     DVT prophylaxis:  CHEST guideline (See page [id number]) Chemical    Foley:  [facility] Foley protocol Not present   IVs:  Peripheral IV   PT/OT: Ordered   Daily CBC & or Chem ordered:  SHM/ABIM guidelines (see #5) Yes, due to clinical and lab instability   Reference for approximate charges of common labs: CBC auto diff - $76  BMP - $99  Mg - $79    Lines:     Patient Lines/Drains/Airways Status    Active PICC Line / CVC Line / PIV Line / Drain / Airway / Intraosseous Line / Epidural Line / ART Line / Line / Wound / Pressure Ulcer / NG/OG Tube     Name:   Placement date:   Placement time:   Site:   Days:    Peripheral IV Right Antecubital        Antecubital       Peripheral IV 08/17/15 Left;Lateral Antecubital  08/17/15   1723   Antecubital  less than 1    Urethral Catheter               Incision Site 08/17/15 Back Upper  08/17/15   1721     less than 1                 Disposition: (Please see PAF column for Expected D/C Date)   Today's date: 08/25/2015  Admit Date: 08/17/2015  4:29 PM  LOS: 8  Clinical Milestones: Infectious workup   Anticipated discharge needs: Per PT/OT recs      Subjective     CC: Arteriovenous fistula of spinal cord vessels    Interval History/24 hour events: Patient recurring fevers.  Denies chest pain, shortness of breath, abdominal pain. Notes secretions are improved compared to days prior.       Review of Systems:     Review of Systems - Negative except as noted above in HPI      Physical Exam:     VITAL SIGNS PHYSICAL EXAM   Temp:  [99.1 F (37.3 C)-101.3 F (38.5 C)] 100.6 F (38.1 C)  Heart Rate:  [58-77] 68  Resp Rate:  [7-26] 20  BP: (91-150)/(52-68) 150/68 mmHg      Intake/Output Summary (Last 24 hours) at 08/25/15 1613  Last data filed at  08/25/15 1400   Gross per 24 hour   Intake    300 ml   Output   1530 ml   Net  -1230 ml    Physical Exam  General: awake, alert X 3  Neck: Aspen collar in place  Cardiovascular: irregularly irregular,  no murmurs, rubs or gallops  Lungs: coarse breath sounds bilaterally, no wheezing, no rales, mild ronchi  Abdomen: soft, non-tender, non-distended; no palpable masses,  normoactive bowel sounds, no rebound, no guarding  Extremities: no edema  Neuro: CN grossly intact, speech fluent, follows commands, 4/5 b/l UE and LE strength        Meds:     Medications were reviewed:    Labs:     Labs (last 72 hours):      Recent Labs  Lab 08/25/15  0342 08/24/15  0909   WBC 10.64 12.48*   HGB 12.1* 12.4*   HEMATOCRIT 36.7* 37.5*   PLATELETS 167 188         Recent Labs  Lab 08/20/15  0140   PT 14.2   PT INR 1.1      Recent Labs  Lab 08/25/15  0342 08/24/15  0920 08/22/15  0355 08/20/15  0140   SODIUM 133* 134* 137 135*   POTASSIUM 4.8 4.2 3.9 3.9   CHLORIDE 98* 97* 100 100   CO2 27 28 30* 26   BUN 10.0 12.0 18.0 23.0   CREATININE 0.6* 0.6* 0.6* 0.6*   CALCIUM 7.8* 8.4 8.2 8.3   ALBUMIN  --   --  2.8* 2.4*   PROTEIN, TOTAL  --   --  5.3* 5.2*   BILIRUBIN, TOTAL  --   --  0.7 0.5   ALKALINE PHOSPHATASE  --   --  62 66   ALT  --   --  25 61*   AST (SGOT)  --   --  21 36*   GLUCOSE 95 74 93 113*                   Microbiology, reviewed and are significant for:  Microbiology Results     Procedure Component Value Units Date/Time  MRSA culture [[id number]] Collected:  08/17/15 2043    Specimen Information:  Body Fluid from Nares and Throat Updated:  08/18/15 0303            Imaging, reviewed and are significant for:  Fluoroscopy Video Swallow With Speech    08/25/2015  Fluoroscopic guidance was provided for oropharyngeal swallowing study. Marty Heck, MD 08/25/2015 11:14 AM           Signed by: Les Pou, MD

## 2015-08-26 ENCOUNTER — Inpatient Hospital Stay: Payer: Medicare Other

## 2015-08-26 MED ORDER — VENELEX EX OINT
TOPICAL_OINTMENT | Freq: Two times a day (BID) | CUTANEOUS | Status: DC
Start: 2015-08-26 — End: 2015-09-24
  Filled 2015-08-26 (×6): qty 60

## 2015-08-26 MED ORDER — VANCOMYCIN 1000 MG IN 250 ML NS IVPB VIAL-MATE (CNR)
1000.0000 mg | Freq: Two times a day (BID) | INTRAVENOUS | Status: DC
Start: 2015-08-26 — End: 2015-08-31
  Administered 2015-08-26 – 2015-08-31 (×11): 1000 mg via INTRAVENOUS
  Filled 2015-08-26 (×11): qty 250

## 2015-08-26 NOTE — Progress Notes (Signed)
MEDICINE PROGRESS NOTE    Date Time: 08/26/2015 1:44 PM  Patient Name: Ernest Diaz  Attending Physician: Les Pou, MD    Assessment:   Principal Problem:    Thoracic spinal AV fistula  Active Problems:    Quadriplegia    Atrial fibrillation  Resolved Problems:    * No resolved hospital problems. *      Plan:   1. Fevers- Patient with recurrent fevers last fever @6pm  yesterday.    - Concern for aspiration PNA given copious secretions  Vs. UTI (E.faecalis)  - Scopolamine patch for secretions     - ID consulted.  - follow cultures  - Cont Zosyn, started vanc pending urine cx sensitivites.  - Foley changed     2. Traumatic cervicothoracic epidural hematoma s/p C3-C5 + T3-T4 decompressive laminectomies 2/27 by Dr. 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 Jaynie Collins on 08/20/15  - Neuro checks q 2 h  - Hold AC and antiplatelets per NSGY, will need clearance from Dr. Jaynie Collins prior to restarting  - Pain control with Oxycontin and prn Oxycodone, bowel regimen while on narcotics      3. Hx A Fib on Eliquis, CHADS2VASC of 2  -Rate controlled on Atenolol, bradycardia improved after decreasing dose to 25 mg q day   -Patient asymptomatic with appropriate chronotropic response  -Hold AC and antiplatelets given procedure at this time  -Discussed with patient, aware of increased risk of CVA without AC or antiplatelets however also understands contraindication from NSGY perspective given risk of bleeding into spine, will need clearance from Dr. Jaynie Collins prior to restarting as OP.      4. HTN - No acute issues  - Continue Norvasc  - Decreased Atenolol as above due to bradycardia    - Continue Maxzide   - Goal < 150/90 as outpatient     5. Urinary Retention- Failed voiding trial   - Bladder scan without Foley > 400 cc, with  Foley showed 80 cc  - Likely due to recent neurosurgical procedure  - Unable to have Flomax due to sulfa allergy, started on Proscar   - Will need outpatient voiding trials at SNF    DVT/GI prophylaxis - Lovenox    Case discussed with: Patient, RN, NSGY, ID    Safety Checklist:     DVT prophylaxis:  CHEST guideline (See page [id number]) Chemical    Foley:  [place] Rn Foley protocol Not present   IVs:  Peripheral IV   PT/OT: Ordered   Daily CBC & or Chem ordered:  SHM/ABIM guidelines (see #5) Yes, due to clinical and lab instability   Reference for approximate charges of common labs: CBC auto diff - $76  BMP - $99  Mg - $79    Lines:     Patient Lines/Drains/Airways Status    Active PICC Line / CVC Line / PIV Line / Drain / Airway / Intraosseous Line / Epidural Line / ART Line / Line / Wound / Pressure Ulcer / NG/OG Tube     Name:   Placement date:   Placement time:   Site:   Days:    Peripheral IV Right Antecubital        Antecubital       Peripheral IV 08/17/15 Left;Lateral Antecubital  08/17/15  1723   Antecubital   less than 1    Urethral Catheter               Incision Site 08/17/15 Back Upper  08/17/15   1721     less than 1                 Disposition: (Please see PAF column for Expected D/C Date)   Today's date: 08/26/2015  Admit Date: 08/17/2015  4:29 PM  LOS: 9  Clinical Milestones: Infectious workup   Anticipated discharge needs: Per PT/OT recs      Subjective     CC: Arteriovenous fistula of spinal cord vessels    Interval History/24 hour events: Last fever 6pm yesterday. Patient otherwise complaining of muscle strain in shoulder but no other focal complaints.        Review of Systems:     Review of Systems - Negative except as noted above in HPI      Physical Exam:     VITAL SIGNS PHYSICAL EXAM   Temp:  [98.8 F (37.1 C)-100.8 F (38.2 C)] 99.1 F (37.3 C)  Heart Rate:  [65-74] 69  Resp Rate:  [12-31] 18  BP: (98-123)/(51-72) 118/58 mmHg      Intake/Output Summary (Last 24 hours) at 08/26/15 1344  Last  data filed at 08/26/15 0800   Gross per 24 hour   Intake    100 ml   Output   1735 ml   Net  -1635 ml    Physical Exam  General: awake, alert X 3  Neck: Aspen collar in place  Cardiovascular: irregularly irregular,  no murmurs, rubs or gallops  Lungs: coarse breath sounds bilaterally, no wheezing, no rales, mild ronchi  Abdomen: soft, non-tender, non-distended; no palpable masses,  normoactive bowel sounds, no rebound, no guarding  Extremities: no edema  Neuro: CN grossly intact, speech fluent, follows commands, 4/5 b/l UE and LE strength        Meds:     Medications were reviewed:    Labs:     Labs (last 72 hours):      Recent Labs  Lab 08/25/15  0342 08/24/15  0909   WBC 10.64 12.48*   HGB 12.1* 12.4*   HEMATOCRIT 36.7* 37.5*   PLATELETS 167 188         Recent Labs  Lab 08/20/15  0140   PT 14.2   PT INR 1.1      Recent Labs  Lab 08/25/15  0342 08/24/15  0920 08/22/15  0355 08/20/15  0140   SODIUM 133* 134* 137 135*   POTASSIUM 4.8 4.2 3.9 3.9   CHLORIDE 98* 97* 100 100   CO2 27 28 30* 26   BUN 10.0 12.0 18.0 23.0   CREATININE 0.6* 0.6* 0.6* 0.6*   CALCIUM 7.8* 8.4 8.2 8.3   ALBUMIN  --   --  2.8* 2.4*   PROTEIN, TOTAL  --   --  5.3* 5.2*   BILIRUBIN, TOTAL  --   --  0.7 0.5   ALKALINE PHOSPHATASE  --   --  62 66   ALT  --   --  25 61*   AST (SGOT)  --   --  21 36*   GLUCOSE 95 74 93 113*                   Microbiology, reviewed and are significant for:  Microbiology Results     Procedure Component Value  Units Date/Time    MRSA culture [[id number]] Collected:  08/17/15 2043    Specimen Information:  Body Fluid from Nares and Throat Updated:  08/18/15 0303            Imaging, reviewed and are significant for:  Xr Chest Ap Portable    08/26/2015   Minimal left basilar linear atelectasis or scarring. Pennelope Bracken, MD 08/26/2015 7:03 AM           Signed by: Les Pou, MD

## 2015-08-26 NOTE — Consults (Signed)
WOC Nurse Wound Consult:     Ernest Diaz is a 70 y.o. male admitted with Arteriovenous fistula of spinal cord vessels. WOC RN service consulted for non blanchable erythema buttocks.      LOS:  LOS: 9 days   POD: 6 Days Post-Op    PMH:   Past Medical History   Diagnosis Date   . Hypertension    . Arthritis    . Pericarditis 1975   . Atrial fibrillation    . Arrhythmia      PSH:   Past Surgical History   Procedure Laterality Date   . Vein surgery     . Colonoscopy       x2   . 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 Eye surgery  1960     Rt eye removed   . Decompression, anterior cervical, fusion, levels 2 N/A 08/20/2015     Procedure: DECOMPRESSION, ANTERIOR CERVICAL, FUSION, LEVELS 2;  Surgeon: Madaline Savage, MD;  Location: [facility];  Service: Neurosurgery;  Laterality: N/A;  C5-C7 ACDF; AVM REMOVAL   . Decompression, posterior cervical, fusion, levels 2 N/A 08/20/2015     Procedure: DECOMPRESSION, POSTERIOR CERVICAL, FUSION, LEVELS 2;  Surgeon: Madaline Savage, MD;  Location: [facility];  Service: Neurosurgery;  Laterality: N/A;       Braden Score: Braden Scale Score: 16 (08/25/15 2000)    Specialty Bed:    [facility]    Wound Assessment:  Pt presents on airtap, offloaded to side.    Linear, 2 cm x 0.2 cm intact red, non blanchable over sacrum, near coccyx. Appears to be more from moisture. Cleansed with NS, applied new foam to protect.      Recommendations/Plan:     venelex ointment to enhance blood flow locally and provide moist wound healing.  Foam to protect coccyx.    Wound care orders entered into EMR.  Care rendered.  WOC Nurse service to follow.      Alver Fisher, RN, [facility]  [facility] [id number]  Pager [phone]

## 2015-08-26 NOTE — Plan of Care (Signed)
Problem: Safety  Goal: Patient will be free from injury during hospitalization  Outcome: Not Progressing  Patient rung call bell for assistance - this RN entered room to find patient distressed about having "swallowed about a gallon of water." Reeducated patient about requesting assistance for eating/drinking d/t aspiration risk, on-call hospitalist paged w/ orders obtained for portable CXR. Throughout this event, VSS & patient w/ no dyspnea or increased WOB; BS unchanged. Will continue to monitor.

## 2015-08-26 NOTE — Progress Notes (Signed)
ID PROGRESS NOTE    Date Time: 08/26/2015 8:45 AM  Patient Name: [facility] III        Subjective, ROS:    Continue to be febrile, hemodynamically stable   On RA  No new labs     Just worked with PT  Feels ok   No headache. No dizziness  Denies pain in neck   cough much better , no SOB  No N/V  Foley in place   Pain in right elbow       Antibiotics and culture results:   Antibiotics: zosyn #3      Cultures:  3/13 Ucx >100,000 E. Faecalis   3/13 Blood cx Ngtd  3/6 MRSA negative     3/9 path FIBROVASCULAR TISSUE, FEW VESSELS AND BONE CHIPS       Lines: PIV access    Physical Exam:     Filed Vitals:    08/26/15 0600   BP: 118/58   Pulse: 69   Temp: 99.1 F (37.3 C)   Resp: 18   SpO2: 97%       General: in bed, in [facility], awake and communicative, no distress, on RA  HEENT: moist mucosa , blind in one eye, not pale, not icteric , cervical collar in place  Scar on the neck examine: mild redness around the upper part. Staples in place , no drainage, not tender.   Chest: S1S2 irregular , coarse BS, no wheeze   Abdomen:soft, not tender , BS +  Ext: no edema   Foley in place       Labs:       Recent CBC WITH DIFF   No results for input(s): WBC, RBC, HGB, HCT, MCV, LABPLAT in the last 24 hours.    Invalid input(s): ADIFF, REFLX, CANCL, BAND, ABAND    Recent CMP   No results for input(s): GLU, BUN, CREAT, NA, K, CL, CO2 in the last 24 hours.        Rads:   3/15 xray Minimal left basilar linear atelectasis or scarring.    3/13 xray No infiltrate. Mild lingular scarring versus atelectasis.    3/8 MRI Postsurgical change with prior laminectomy. 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 There findings suspicious for a disc ligamentous injury at this level. Correlation with CT is advised    3/7 xray No acute process    3/7 IR No angiographic evidence of intracranial aneurysm, arteriovenous malformation or dural fistula. Is no angiographic evidence of spinal arteriovenous  malformation or dural fistula.  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   Urinary retention, required reinsertion of foley.      Recent fever/SIRS on 3/12   Symptoms of aspiration with increased cough and mild-mod oropharyngeal dysphagia   Leukocytosis    Plan:   1. SIRS.   Due to UTI/aspiration (given worsening of cough over the weekend and evidence of dysphagia)     On zosyn.   Continue to be febrile.   Would  add vancomycin pending sensitivity of enterococcus.   Change the foley.     Monitor fever curve and symptoms closely.   Monitor the surgical scar very closely.     2. Possible aspiration pneumonia/pneumonitis.  Last xray with Minimal left basilar linear atelectasis or scarring.  On zosyn.   On RA, cough better.   Continue.   Aspiration precaution.     3. E. Faecalis UTI.   Cath related   Change the foley  Treatment as above.     4. Leukocytosis.   Due to above.   Resolved.    Discussed with the patient.   Discussed with Dr. Delbert Phenix.     Signed by: [place]. [id number]  Infectious Disease  Phone: [phone]  Fax: [phone]

## 2015-08-26 NOTE — Progress Notes (Addendum)
SHIFT OVERVIEW: See previous POC note by this RN. MS unchanged: a&o x3, BUE able to resist +3/5, BLE able to overcome gravity +2/5, FC as able. STM loss noted. TMax 99.9. PRN Tylenol, Robaxin, & Oxycodone given for pain/muscle spasms w/ intended effect. Soft C-collar in place throughout the night. Maintained SpO2 > 92% on RA w/ no desats, fewer secretions this evening, HR rate-controlled AFib < 100 bpm, BP WNL, still w/ some coughs w/ swallows, AUOP to FC (in place 2/2 nerve damage), no BM overnight - PRN Pericolace & Bisacodyl suppository given. Dressing to cervical site C/D/I; steri-strips clean & dry but barely intact - replaced. See doc flows & physician's orders for shift details.    SAFETY: This RN verified patient's armband prior to rendering care. Intentional hourly rounding performed, I/O's collected, aspiration precautions observed.   Fall mats @ bedside x2.   Bed exit alarm activated   Patient able to verbalize needs appropriately.   CHG bath provided.   SCDs on.   Soft C-collar maintained.   Turned q2.   FC in place for nerve damage.   FC care completed.   Next voiding trial as OP @ SNF.   Appreciate SLP recs for eating/swallows.    PLAN: Neuro checks q2.   OT/PT/SLP.   Pulmonary toilet.   IV ABX.   Monitor fever curve.   Trend BCx & UA.   Still needs sputum culture.   Goal BP < 150/90 as OP.

## 2015-08-26 NOTE — Progress Notes (Signed)
NUTRITION:  Reason for Assessment: Screen/LOS    Recommend:  Mech alt diet, thins per speech  Encourage po - goal 75% with meals (if decreased, rec add supplement, such as Ensure Enlive, [employer], etc)     Clinical Update:  Ernest Diaz is a(n) 70 y.o. male with thoracic spinal AV fistula, quad, afib, fevers   VFSS: mech alt solids, thin liquids     Labs: Na 133  Meds: zosyn, miralax,, zofran, pericolace     Anthropometrics:  Height: 180.3 cm (5\' 11" )  Weight: 89.6 kg (197 lb 8.5 oz)  Body mass index is 27.56 kg/(m^2).    Diet / Nutrition Support Order: Mech alt diet, thin liquids     Nutrition Goals: 2000-2250 kcals, 90-110 gm protein     Monitor/Eval:  Monitor nutr support goals, labs, GI, med tx plan    Georgina Pillion, RD

## 2015-08-26 NOTE — OT Progress Note (Signed)
[facility]   Occupational Therapy Treatment     Patient: Ernest Diaz    MRN#: [id number]   Unit: [facility] TOWER 4  Bed: F432/F432.01      Discharge Recommendations:   Discharge Recommendation: SNF   DME Recommended for Discharge:  (TBD at SNF)    If SNF recommended discharge disposition is not available, patient will need 2-person total assist for mobility and max-total assist for ADLs/IADLs, hospital bed, hoyer lift, w/c, bsc, stretcher transport equipment, and HHOT.     Discharge recommendations are subject to change based on patient's progress. Please refer to the most recent treatment note for the most up to date recommendation.  Assessment:   Pt motivated to participate in OT tx. Pt demo'd improvement with initiation and sitting balance from previous session. Pt able to sustain sitting balance with mostly mod assist with periods of max assist-min assist; 4 brief periods of CGA lasting 2-4 seconds. Mod-max hand over hand assist to wash proximal BLEs with mod cueing. Pt will continue to benefit from skilled OT to maximize independence.  Patient left without needs and call bell within reach. RN notified of session outcome.    Treatment Activities: Modified ADL retraining, functional transfer training to sit EOB, modified bed mobility training, education on energy conservation/fall prevention techniques, compensatory technique education, neuro re-ed, UE ROM/strengthening (within precautions), endurance training, Relaxation/pain management techniques      Educated the patient to role of occupational therapy, plan of care, goals of therapy and HEP, safety with mobility and ADLs, energy conservation techniques, spine precautions, home safety.    Plan:    OT Frequency Recommended: 2-3x/wk     Continue plan of care.       Precautions and Contraindications:   Falls  C-spine precautions  Soft collar in bed, Aspen collar for ambulation    Updated Medical  Status/Imaging/Labs:  reviewed    Subjective: "I always feel like I'm falling forward and I know it's an irrational fear"   Patient's medical condition is appropriate for Occupational Therapy intervention at this time.  Patient is agreeable to participation in the therapy session. Nursing clears patient for therapy.    Pain:   Scale: 0/10 at rest, 9/10 with certain movements of RUE  Location: RUE (IV site)  Intervention: Relaxation/pain management techniques, Repositioned for comfort, Rest breaks provided, RN aware      Objective:   Patient received in bed with [facility] monitors, PIV access, soft c-collar donned, surgical dressing, foley, SCDs, bed alarm, fall mat in place.    Cognition   Pt able to follow simple one step commands with intermittent repetition and t/v cueing, pleasant and cooperative    Functional Mobility  Rolling:  Mod-max assist x2 with cueing for log roll technique  Supine to Sit: max assist x2 with cueing for modified techniques  Sit to Supine: max assist x2 with cueing for modified techniques      Balance  Static Sitting: mostly mod assist with periods of max assist-min assist; 4 brief periods of CGA lasting 2-4 seconds  Dynamic Sitting: max assist-dependent x1-2    Self Care and Home Management  Bathing: Mod-max hand over hand assist to wash proximal BLEs with mod cueing.  UE Dressing: max assist to manage gown  LE Dressing: dependent      Therapeutic Exercises  Neuro re-ed tasks completed to increase postural control  TherEx for BUEs/BLEs/trunk  Exercise incorporated into tx.    Participation: good  Endurance:  fair+ (VSS)    Patient left with call bell within reach, all needs met, SCDs replaced on pt, fall mat in place, bed alarm engaged in reclined bed-chair position, chair alarm n/a and all questions answered. RN notified of session outcome and patient response.     Goals:  Time For Goal Achievement: 5 visits  ADL Goals  Patient will groom self: Minimal Assist  Patient will dress upper body:  Moderate Assist (with modified techniques)  Patient will dress lower body: Moderate Assist, with AE  Pt will complete bathing: Moderate Assist  Patient will toilet: Moderate Assist  Mobility and Transfer Goals  Pt will perform functional transfers: Moderate Assist  Neuro Re-Ed Goals  Pt will perform dynamic sitting balance: Minimal Assist, to increase ability to complete ADLs  Pt will sit at edge of bed: Stand by Assist, to prepare for OOB tasks  Musculoskeletal Goals  Pt will perform Home Exercise Program: stand by assist, with caregiver/family assist, to increase engagement in ADLs                   Ernest Diaz. Ernest Diaz, OTR/L, pager# [phone]    Time of Treatment  OT Received On: 08/26/15  Start Time: 0950  Stop Time: 1040  Time Calculation (min): 50 min    Treatment # 1 of 5 visits

## 2015-08-26 NOTE — Plan of Care (Signed)
Problem: Safety  Goal: Patient will be free from injury during hospitalization  Outcome: Progressing  Pt AO , FC and MAE. Remains on RA. VSS see flowsheet. Put in new foley per MD and BM x1.PRN oxycodone 15mg  x2. Will continue to monitor.

## 2015-08-26 NOTE — SLP Progress Note (Signed)
[facility]   SLP Treatment Note  Patient: Ovila Lepage III    MRN#: [id number]     Treatment Type: dysphagia f/u    Recommendations/Plan:   - Diet Recommendations: Continue mechanically altered solids and thin liquids  - Aspiration Precautions: Sitting upright, small single bites/sips and slow intake rate, two swallows per bolus, alternate solids/liquids, throat clear during meals    SLP Frequency Recommended: 3x per week    Discharge recommendations: Rehab consult    Assessment:   Patient presents with mild-mod oropharyngeal dysphagia on VFSS completed yesterday. Patient completed pharyngeal exercises with SLP.  Continue current diet  Subjective:   Pain: None; odynophagia.   Current Diet:  MA/thin  Respiratory Status:  Hx PNA  Precautions:  Fall, aspiration, c-collar  Patient seen immediately after finishing breakfast. Pt reported he was not having difficulty with breakfast. Patient left with call bell within reach, all needs met, SCDs in place, fall mat in place, bed alarm activated and all questions answered.   Objective:   Objective:   Patient accepted ice chips x5 during pharyngeal exercises. Pt completed Mas[place] and effortful swallow x10 each. Unable to complete other exercises at this time due to soft c-collar. Pt agreed to perform exercises independently outside of tx.     Patient Education:  Verbal and written; pt expressed understanding and agreement    Goals:   Patient will tolerate mechanically altered solids and thin liquids without s/s aspiration x24-48 hours. Continue  Patient will complete pharyngeal exercises independently outside of tx. NEW    Matt Chestina Komatsu MS, CCC-SLP     Time of Treatment:  SLP Received On: 08/26/15  Start Time: 0900  Stop Time: 0920  Time Calculation (min): 20 min

## 2015-08-27 ENCOUNTER — Other Ambulatory Visit: Payer: Medicare Other

## 2015-08-27 LAB — SODIUM, URINE, RANDOM: Urine Sodium Random: 148 mEq/L

## 2015-08-27 LAB — CBC AND DIFFERENTIAL
Basophils Absolute Automated: 0 10*3/uL (ref 0.00–0.20)
Basophils Automated: 0 %
Eosinophils Absolute Automated: 0.05 10*3/uL (ref 0.00–0.70)
Eosinophils Automated: 1 %
Hematocrit: 34.5 % — ABNORMAL LOW (ref 42.0–52.0)
Hgb: 11.5 g/dL — ABNORMAL LOW (ref 13.0–17.0)
Immature Granulocytes Absolute: 0.03 10*3/uL
Immature Granulocytes: 0 %
Lymphocytes Absolute Automated: 0.91 10*3/uL (ref 0.50–4.40)
Lymphocytes Automated: 11 %
MCH: 32.9 pg — ABNORMAL HIGH (ref 28.0–32.0)
MCHC: 33.3 g/dL (ref 32.0–36.0)
MCV: 98.6 fL (ref 80.0–100.0)
MPV: 10.3 fL (ref 9.4–12.3)
Monocytes Absolute Automated: 0.68 10*3/uL (ref 0.00–1.20)
Monocytes: 8 %
Neutrophils Absolute: 6.32 10*3/uL (ref 1.80–8.10)
Neutrophils: 79 %
Nucleated RBC: 0 /100 WBC (ref 0–1)
Platelets: 230 10*3/uL (ref 140–400)
RBC: 3.5 10*6/uL — ABNORMAL LOW (ref 4.70–6.00)
RDW: 14 % (ref 12–15)
WBC: 7.99 10*3/uL (ref 3.50–10.80)

## 2015-08-27 LAB — BASIC METABOLIC PANEL
BUN: 7 mg/dL — ABNORMAL LOW (ref 9.0–28.0)
CO2: 24 mEq/L (ref 22–29)
Calcium: 8.1 mg/dL (ref 7.9–10.2)
Chloride: 98 mEq/L — ABNORMAL LOW (ref 100–111)
Creatinine: 0.5 mg/dL — ABNORMAL LOW (ref 0.7–1.3)
Glucose: 101 mg/dL — ABNORMAL HIGH (ref 70–100)
Potassium: 3.9 mEq/L (ref 3.5–5.1)
Sodium: 132 mEq/L — ABNORMAL LOW (ref 136–145)

## 2015-08-27 LAB — VANCOMYCIN, TROUGH: Vancomycin Trough: 7.3 ug/mL — ABNORMAL LOW (ref 10.0–20.0)

## 2015-08-27 LAB — CREATININE, URINE, RANDOM: Urine Creatinine, Random: 50.9

## 2015-08-27 LAB — OSMOLALITY, URINE: Urine Osmolality: 475 mosm/kg (ref 300–1094)

## 2015-08-27 LAB — GFR: EGFR: >60

## 2015-08-27 NOTE — Progress Notes (Addendum)
ID PROGRESS NOTE    Date Time: 08/27/2015 9:17 AM  Patient Name: Ernest Diaz        Subjective, ROS:    Continue to have low grade fever, hemodynamically stable   Stable labs    Nurse at bedside    Diet was juts changed   No fever   No headache. No dizziness  Complains of pain in the arms   cough +/better , no SOB  No N/V  Foley in place        Antibiotics and culture results:   Antibiotics: zosyn #4. vanc #2      Cultures:  3/13 Ucx >100,000 E. Faecalis   3/13 Blood cx Ngtd  3/6 MRSA negative     3/9 path FIBROVASCULAR TISSUE, FEW VESSELS AND BONE CHIPS       Lines: PIV access    Physical Exam:     Filed Vitals:    08/27/15 0600   BP: 133/60   Pulse: 66   Temp: 98.8 F (37.1 C)   Resp: 17   SpO2: 97%       General: in bed, in [facility], awake and communicative, no distress, on RA, nurse at bedside.   HEENT: moist mucosa , blind in one eye, not pale, not icteric , cervical collar in place  Scar on the neck examined yesterday: mild redness around the upper part. Staples in place , no drainage, not tender.   Chest: S1S2 irregular , coarse BS, no wheeze   Abdomen:soft, not tender , BS +  Ext: no edema   Foley in place       Labs:       Recent CBC WITH DIFF   Recent Labs      08/27/15   0333   WBC  7.99   RBC  3.50*   HGB  11.5*   HEMATOCRIT  34.5*   MCV  98.6       Recent CMP   Recent Labs      08/27/15   0333   GLUCOSE  101*   BUN  7.0*   CREATININE  0.5*   SODIUM  132*   POTASSIUM  3.9   CHLORIDE  98*   CO2  24           Rads:   3/15 xray Minimal left basilar linear atelectasis or scarring.    3/13 xray No infiltrate. Mild lingular scarring versus atelectasis.    3/8 MRI Postsurgical change with prior laminectomy. 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 There findings suspicious for a disc ligamentous injury at this level. Correlation with CT is advised    3/7 xray No acute process    3/7 IR No angiographic evidence of intracranial aneurysm, arteriovenous  malformation or dural fistula. Is no angiographic evidence of spinal arteriovenous malformation or dural fistula.  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   Urinary retention, required reinsertion of foley.      Recent fever/SIRS on 3/12  Symptoms of aspiration with increased cough and mild-mod oropharyngeal dysphagia   Leukocytosis    Plan:   1. SIRS.   Due to UTI an possible aspiration (given worsening of cough over last weekend and evidence of dysphagia)     On zosyn and vancomycin.   Continue to be febrile/ow grade.     Change/remove the foley (not done yet)  Diet was just changed/aspiration precaution.   Monitor the surgical scar very closely.     If cont to be febrile, discuss with neurosurgery to see if needs re imaging.  Continue the current regimen although E. Faecalis amp sensitive .  Check trough.     2. Possible aspiration pneumonia/pneumonitis.  Last xray with Minimal left basilar linear atelectasis or scarring.  On zosyn and vanc.    On RA, cough better but still with low grade fever.     Diet was just changed.   Continue current regimen.   Aspiration precaution.     3. E. Faecalis UTI.   Cath related   Change/remove the foley  Continue current regimen.    4. Leukocytosis.   Due to above.   Resolved.    Discussed with the patient.   Discussed with nursing      Signed by: [place]. [id number]  Infectious Disease  Phone: [phone]  Fax: [phone]

## 2015-08-27 NOTE — SLP Progress Note (Signed)
[facility]   SLP Treatment Note  Patient: Ernest Diaz    MRN#: [id number]     Treatment Type: dysphagia f/u    Recommendations/Plan:   - Diet Recommendations: Downgrade to mechanically altered solids and NECTAR liquids; no mixed consistencies  - Aspiration Precautions: Sitting upright, small single bites/sips and slow intake rate, two swallows per bolus, alternate solids/liquids, throat clear during meals    SLP Frequency Recommended: 3x per week    Discharge recommendations: Rehab consult    Assessment:   Patient presents with decline in swallow function since VFSS completed 3/14. Pt now presents with cough response on thin liquids and c/o increased difficulty swallowing. SLP recommends downgrade to NECTAR liquids with continued strict aspiration precautions and close f/u.  Subjective:   Pain: None; odynophagia.   Current Diet:  MA/thin  Respiratory Status:  Hx PNA  Precautions:  Fall, aspiration, c-collar  Patient seen in bed. Reports difficulty with liquids and peaches (mixed consistency). RN also reports coughing. Patient left with call bell within reach, all needs met, SCDs in place, fall mat in place, bed alarm activated and all questions answered.   Objective:   Objective:   Patient accepted thin liquids and nectar liquids by straw, and mechanically altered solids (scrambled eggs). Adequate oral phase. Pt with two-four swallows per liquid bolus and delayed cough on thin liquids. Pt reports nectar felt easier to swallow. Occasional throat clearing throughout all trials, which was also evidenced on VFSS and which was indicative of trace penetration rather than aspiration.     Patient Education:  Verbal; pt expressed understanding and agreement    Goals:   Patient will tolerate mechanically altered solids and thin liquids without s/s aspiration x24-48 hours. D/c  Patient will complete pharyngeal exercises independently outside of tx.   Patient will tolerate mechanically altered solids  and Nectar liquids without s/s aspiration x24-48 hours. NEW    Matt Tyge Somers MS, CCC-SLP     Time of Treatment:  SLP Received On: 08/27/15  Start Time: 0920  Stop Time: 0940  Time Calculation (min): 20 min

## 2015-08-27 NOTE — Progress Notes (Signed)
MEDICINE PROGRESS NOTE    Date Time: 08/27/2015 1:57 PM  Patient Name: Ernest Diaz  Attending Physician: Les Pou, MD    Assessment:   Principal Problem:    Thoracic spinal AV fistula  Active Problems:    Quadriplegia    Atrial fibrillation  Resolved Problems:    * No resolved hospital problems. *      Plan:   1. Fevers- Patient with recurrent low grade fever yesterday. Leukocytosis resolved.    - Concern for aspiration PNA given copious secretions  Vs. UTI (E.faecalis)    - Scopolamine patch for secretions      - ID consulted.  - follow cultures  - Cont Vanc/Zosyn per ID  - Foley changed       2. Traumatic cervicothoracic epidural hematoma s/p C3-C5 + T3-T4 decompressive laminectomies 2/27 by Dr. 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 Jaynie Collins on 08/20/15  - Neuro checks q 2 h  - Hold AC and antiplatelets per NSGY, will need clearance from Dr. Jaynie Collins prior to restarting  - Pain control with Oxycontin and prn Oxycodone, bowel regimen while on narcotics      3. Hx A Fib on Eliquis, CHADS2VASC of 2  -Rate controlled on Atenolol, bradycardia improved after decreasing dose to 25 mg q day   -Patient asymptomatic with appropriate chronotropic response  -Hold AC and antiplatelets given procedure at this time  -Discussed with patient, aware of increased risk of CVA without AC or antiplatelets however also understands contraindication from NSGY perspective given risk of bleeding into spine, will need clearance from Dr. Jaynie Collins prior to restarting as OP.      4. HTN - No acute issues  - Continue Norvasc  - Decreased Atenolol as above due to bradycardia    - Continue Maxzide   - Goal < 150/90 as outpatient           5. Urinary Retention- Failed voiding trial   - Bladder scan without Foley > 400 cc, with Foley  showed 80 cc  - Likely due to recent neurosurgical procedure  - Unable to have Flomax due to sulfa allergy, started on Proscar   - Will need outpatient voiding trials at SNF  - Voiding trial today, while foley being changed.     6. Hyponatremia: will f/u urine studies. Etiology c/f SIADH  -fluid restriction pending urine studies  -daily BMP    7. RUE pain  -X-Rays pending     DVT/GI prophylaxis - Lovenox  Case discussed with: Patient, RN, NSGY, ID.     Safety Checklist:     DVT prophylaxis:  CHEST guideline (See page [id number]) Chemical    Foley:  [place] Rn Foley protocol Not present   IVs:  Peripheral IV   PT/OT: Ordered   Daily CBC & or Chem ordered:  SHM/ABIM guidelines (see #5) Yes, due to clinical and lab instability   Reference for approximate charges of common labs: CBC auto diff - $76  BMP - $99  Mg - $79    Lines:     Patient Lines/Drains/Airways Status    Active PICC Line / CVC Line / PIV Line / Drain / Airway / Intraosseous Line / Epidural Line / ART Line / Line / Wound / Pressure Ulcer / NG/OG  Tube     Name:   Placement date:   Placement time:   Site:   Days:    Peripheral IV Right Antecubital        Antecubital       Peripheral IV 08/17/15 Left;Lateral Antecubital  08/17/15   1723   Antecubital   less than 1    Urethral Catheter               Incision Site 08/17/15 Back Upper  08/17/15   1721     less than 1                 Disposition: (Please see PAF column for Expected D/C Date)   Today's date: 08/27/2015  Admit Date: 08/17/2015  4:29 PM  LOS: 10  Clinical Milestones: Infectious workup   Anticipated discharge needs: Per PT/OT recs      Subjective     CC: Arteriovenous fistula of spinal cord vessels    Interval History/24 hour events:  Patient otherwise complaining of muscle strain in shoulder but no other focal complaints.        Review of Systems:     Review of Systems - Negative except as noted above in HPI      Physical Exam:     VITAL SIGNS PHYSICAL EXAM   Temp:  [98.8 F (37.1 C)-100.6 F (38.1  C)] 98.8 F (37.1 C)  Heart Rate:  [62-76] 66  Resp Rate:  [11-23] 17  BP: (92-133)/(53-63) 133/60 mmHg      Intake/Output Summary (Last 24 hours) at 08/27/15 1357  Last data filed at 08/27/15 1200   Gross per 24 hour   Intake      0 ml   Output   1735 ml   Net  -1735 ml    Physical Exam  General: awake, alert X 3  Neck: Aspen collar in place  Cardiovascular: irregularly irregular,  no murmurs, rubs or gallops  Lungs: coarse breath sounds bilaterally, no wheezing, no rales, mild ronchi  Abdomen: soft, non-tender, non-distended; no palpable masses,  normoactive bowel sounds, no rebound, no guarding  Extremities: no edema  Neuro: CN grossly intact, speech fluent, follows commands, 4/5 b/l UE and LE strength        Meds:     Medications were reviewed:    Labs:     Labs (last 72 hours):      Recent Labs  Lab 08/27/15  0333 08/25/15  0342   WBC 7.99 10.64   HGB 11.5* 12.1*   HEMATOCRIT 34.5* 36.7*   PLATELETS 230 167            Recent Labs  Lab 08/27/15  0333 08/25/15  0342  08/22/15  0355   SODIUM 132* 133* More results in Results Review 137   POTASSIUM 3.9 4.8 More results in Results Review 3.9   CHLORIDE 98* 98* More results in Results Review 100   CO2 24 27 More results in Results Review 30*   BUN 7.0* 10.0 More results in Results Review 18.0   CREATININE 0.5* 0.6* More results in Results Review 0.6*   CALCIUM 8.1 7.8* More results in Results Review 8.2   ALBUMIN  --   --   --  2.8*   PROTEIN, TOTAL  --   --   --  5.3*   BILIRUBIN, TOTAL  --   --   --  0.7   ALKALINE PHOSPHATASE  --   --   --  62   ALT  --   --   --  25   AST (SGOT)  --   --   --  21   GLUCOSE 101* 95 More results in Results Review 93   More results in Results Review = values in this interval not displayed.                Microbiology, reviewed and are significant for:  Microbiology Results     Procedure Component Value Units Date/Time    MRSA culture [[id number]] Collected:  08/17/15 2043    Specimen Information:  Body Fluid from Nares and Throat  Updated:  08/18/15 0303            Imaging, reviewed and are significant for:  No results found.        Signed by: Les Pou, MD

## 2015-08-28 ENCOUNTER — Inpatient Hospital Stay: Payer: Medicare Other | Admitting: Certified Registered"

## 2015-08-28 ENCOUNTER — Inpatient Hospital Stay: Payer: Medicare Other

## 2015-08-28 ENCOUNTER — Encounter: Payer: Self-pay | Admitting: Anesthesiology

## 2015-08-28 ENCOUNTER — Other Ambulatory Visit: Payer: Medicare Other

## 2015-08-28 ENCOUNTER — Encounter
Admission: AD | Disposition: A | Discharge status: Other Acute Inpatient Hospital | Payer: Self-pay | Source: Other Acute Inpatient Hospital | Attending: Internal Medicine

## 2015-08-28 DIAGNOSIS — I482 Chronic atrial fibrillation: Secondary | ICD-10-CM

## 2015-08-28 DIAGNOSIS — M532X2 Spinal instabilities, cervical region: Secondary | ICD-10-CM

## 2015-08-28 HISTORY — PX: FUSION, ANTERIOR CERVICAL, LEVEL 1: SHX4137

## 2015-08-28 LAB — CBC AND DIFFERENTIAL
Basophils Absolute Automated: 0 10*3/uL (ref 0.00–0.20)
Basophils Automated: 0 %
Eosinophils Absolute Automated: 0.06 10*3/uL (ref 0.00–0.70)
Eosinophils Automated: 1 %
Hematocrit: 35.4 % — ABNORMAL LOW (ref 42.0–52.0)
Hgb: 11.6 g/dL — ABNORMAL LOW (ref 13.0–17.0)
Immature Granulocytes Absolute: 0.03 10*3/uL
Immature Granulocytes: 0 %
Lymphocytes Absolute Automated: 0.85 10*3/uL (ref 0.50–4.40)
Lymphocytes Automated: 11 %
MCH: 32.5 pg — ABNORMAL HIGH (ref 28.0–32.0)
MCHC: 32.8 g/dL (ref 32.0–36.0)
MCV: 99.2 fL (ref 80.0–100.0)
MPV: 10.2 fL (ref 9.4–12.3)
Monocytes Absolute Automated: 0.67 10*3/uL (ref 0.00–1.20)
Monocytes: 8 %
Neutrophils Absolute: 6.36 10*3/uL (ref 1.80–8.10)
Neutrophils: 80 %
Nucleated RBC: 0 /100 WBC (ref 0–1)
Platelets: 271 10*3/uL (ref 140–400)
RBC: 3.57 10*6/uL — ABNORMAL LOW (ref 4.70–6.00)
RDW: 14 % (ref 12–15)
WBC: 7.97 10*3/uL (ref 3.50–10.80)

## 2015-08-28 LAB — GFR: EGFR: >60

## 2015-08-28 LAB — BASIC METABOLIC PANEL
BUN: 7 mg/dL — ABNORMAL LOW (ref 9.0–28.0)
CO2: 29 mEq/L (ref 22–29)
Calcium: 8.5 mg/dL (ref 7.9–10.2)
Chloride: 99 mEq/L — ABNORMAL LOW (ref 100–111)
Creatinine: 0.5 mg/dL — ABNORMAL LOW (ref 0.7–1.3)
Glucose: 96 mg/dL (ref 70–100)
Potassium: 4.5 mEq/L (ref 3.5–5.1)
Sodium: 134 mEq/L — ABNORMAL LOW (ref 136–145)

## 2015-08-28 SURGERY — FUSION, ANTERIOR CERVICAL, LEVEL1
Anesthesia: Anesthesia General | Site: Spine Cervical | Wound class: Clean

## 2015-08-28 MED ORDER — PIPERACILLIN SOD-TAZOBACTAM SO 4.5 (4-0.5) G IV SOLR
INTRAVENOUS | Status: AC
Start: 2015-08-28 — End: 2015-08-29
  Administered 2015-08-29: 4.5 g via INTRAVENOUS
  Filled 2015-08-28: qty 20

## 2015-08-28 MED ORDER — LIDOCAINE-EPINEPHRINE 1 %-1:100000 IJ SOLN
INTRAMUSCULAR | Status: DC | PRN
Start: 2015-08-28 — End: 2015-08-29
  Administered 2015-08-28: 1.5 mL

## 2015-08-28 MED ORDER — ROCURONIUM BROMIDE 50 MG/5ML IV SOLN
INTRAVENOUS | Status: AC
Start: 2015-08-28 — End: ?
  Filled 2015-08-28: qty 5

## 2015-08-28 MED ORDER — LUBRIFRESH P.M. OP OINT
TOPICAL_OINTMENT | OPHTHALMIC | Status: AC
Start: 2015-08-28 — End: ?
  Filled 2015-08-28: qty 3.5

## 2015-08-28 MED ORDER — EPHEDRINE SULFATE 50 MG/ML IJ/IV SOLN (WRAP)
Status: AC
Start: 2015-08-28 — End: ?
  Filled 2015-08-28: qty 1

## 2015-08-28 MED ORDER — FENTANYL CITRATE (PF) 50 MCG/ML IJ SOLN (WRAP)
INTRAMUSCULAR | Status: AC
Start: 2015-08-28 — End: ?
  Filled 2015-08-28: qty 2

## 2015-08-28 MED ORDER — BUPIVACAINE HCL 0.25 % IJ SOLN
INTRAMUSCULAR | Status: DC | PRN
Start: 2015-08-28 — End: 2015-08-29
  Administered 2015-08-28: 1.5 mL

## 2015-08-28 MED ORDER — SODIUM CHLORIDE 0.9 % IR SOLN
Status: DC | PRN
Start: 2015-08-28 — End: 2015-08-29
  Administered 2015-08-28 – 2015-08-29 (×2): 1000 mL

## 2015-08-28 MED ORDER — ACETYLCYSTEINE 10 % IN SOLN
3.0000 mL | Freq: Three times a day (TID) | RESPIRATORY_TRACT | Status: DC
Start: 2015-08-28 — End: 2015-08-29
  Administered 2015-08-29: 4 mL via RESPIRATORY_TRACT
  Filled 2015-08-28 (×3): qty 4

## 2015-08-28 MED ORDER — LACTATED RINGERS IV SOLN
INTRAVENOUS | Status: DC
Start: 2015-08-28 — End: 2015-08-29

## 2015-08-28 MED ORDER — SODIUM CHLORIDE 0.9 % IV SOLN
100.0000 mg | INTRAVENOUS | Status: DC | PRN
Start: 2015-08-28 — End: 2015-08-29
  Administered 2015-08-28: 30 ug/min via INTRAVENOUS
  Administered 2015-08-29: 35 ug/min via INTRAVENOUS

## 2015-08-28 MED ORDER — ROCURONIUM BROMIDE 10 MG/ML IV SOLN (WRAP)
INTRAVENOUS | Status: DC | PRN
Start: 2015-08-28 — End: 2015-08-29
  Administered 2015-08-28: 30 mg via INTRAVENOUS
  Administered 2015-08-29 (×3): 20 mg via INTRAVENOUS

## 2015-08-28 MED ORDER — FENTANYL CITRATE (PF) 50 MCG/ML IJ SOLN (WRAP)
INTRAMUSCULAR | Status: DC | PRN
Start: 2015-08-28 — End: 2015-08-29
  Administered 2015-08-28: 50 ug via INTRAVENOUS
  Administered 2015-08-28: 100 ug via INTRAVENOUS
  Administered 2015-08-29 (×5): 50 ug via INTRAVENOUS

## 2015-08-28 MED ORDER — SODIUM CHLORIDE 0.9 % IV SOLN
INTRAVENOUS | Status: DC
Start: 2015-08-28 — End: 2015-08-29

## 2015-08-28 MED ORDER — PROPOFOL 10 MG/ML IV EMUL (WRAP)
INTRAVENOUS | Status: DC | PRN
Start: 2015-08-28 — End: 2015-08-29
  Administered 2015-08-28: 10 mg via INTRAVENOUS
  Administered 2015-08-28: 140 mg via INTRAVENOUS
  Administered 2015-08-29 (×2): 30 mg via INTRAVENOUS
  Administered 2015-08-29: 25 mg via INTRAVENOUS
  Administered 2015-08-29: 20 mg via INTRAVENOUS

## 2015-08-28 MED ORDER — SODIUM CHLORIDE 0.9 % IV SOLN
INTRAVENOUS | Status: DC | PRN
Start: 2015-08-28 — End: 2015-08-29

## 2015-08-28 MED ORDER — THROMBIN 5000 UNITS EX SOLR
CUTANEOUS | Status: DC | PRN
Start: 2015-08-28 — End: 2015-08-29
  Administered 2015-08-28: 20000 [IU] via TOPICAL
  Administered 2015-08-29 (×2): 5000 [IU] via TOPICAL

## 2015-08-28 MED ORDER — MORPHINE SULFATE 2 MG/ML IJ/IV SOLN (WRAP)
2.0000 mg | Status: DC | PRN
Start: 2015-08-28 — End: 2015-08-29
  Administered 2015-08-28 (×2): 2 mg via INTRAVENOUS
  Filled 2015-08-28 (×2): qty 1

## 2015-08-28 MED ORDER — FAMOTIDINE 20 MG/2ML IV SOLN
INTRAVENOUS | Status: AC
Start: 2015-08-28 — End: ?
  Filled 2015-08-28: qty 2

## 2015-08-28 MED ORDER — GELATIN ABSORBABLE 100 EX MISC
CUTANEOUS | Status: DC | PRN
Start: 2015-08-28 — End: 2015-08-29
  Administered 2015-08-28: 1 via TOPICAL

## 2015-08-28 MED ORDER — ONDANSETRON HCL 4 MG/2ML IJ SOLN
4.0000 mg | Freq: Once | INTRAMUSCULAR | Status: DC | PRN
Start: 2015-08-28 — End: 2015-08-29

## 2015-08-28 MED ORDER — PHENYLEPHRINE 100 MCG/ML IN NACL 0.9% IV SOSY
PREFILLED_SYRINGE | INTRAVENOUS | Status: AC
Start: 2015-08-28 — End: ?
  Filled 2015-08-28: qty 5

## 2015-08-28 MED ORDER — FENTANYL CITRATE (PF) 50 MCG/ML IJ SOLN (WRAP)
25.0000 ug | INTRAMUSCULAR | Status: DC | PRN
Start: 2015-08-28 — End: 2015-08-29

## 2015-08-28 MED ORDER — ALBUTEROL SULFATE (2.5 MG/3ML) 0.083% IN NEBU
2.5000 mg | INHALATION_SOLUTION | Freq: Four times a day (QID) | RESPIRATORY_TRACT | Status: DC | PRN
Start: 2015-08-28 — End: 2015-09-24
  Administered 2015-08-29 – 2015-09-09 (×12): 2.5 mg via RESPIRATORY_TRACT
  Filled 2015-08-28 (×12): qty 3

## 2015-08-28 MED ORDER — MICROFIBRILLAR COLL HEMOSTAT EX PADS
MEDICATED_PAD | CUTANEOUS | Status: DC | PRN
Start: 2015-08-28 — End: 2015-08-29
  Administered 2015-08-28: 2 via TOPICAL

## 2015-08-28 MED ORDER — BACITRACIN 50000 UNITS IM SOLR
INTRAMUSCULAR | Status: DC | PRN
Start: 2015-08-28 — End: 2015-08-29
  Administered 2015-08-28 – 2015-08-29 (×2): 50000 [IU]

## 2015-08-28 MED ORDER — LACTATED RINGERS IV SOLN
INTRAVENOUS | Status: DC | PRN
Start: 2015-08-28 — End: 2015-08-29

## 2015-08-28 MED ORDER — PROPOFOL 10 MG/ML IV EMUL (WRAP)
INTRAVENOUS | Status: AC
Start: 2015-08-28 — End: ?
  Filled 2015-08-28: qty 20

## 2015-08-28 MED ORDER — LIDOCAINE HCL (PF) 2 % IJ SOLN
INTRAMUSCULAR | Status: AC
Start: 2015-08-28 — End: ?
  Filled 2015-08-28: qty 5

## 2015-08-28 MED ORDER — PHENYLEPHRINE HCL 10 MG/ML IV SOLN (WRAP)
Status: DC | PRN
Start: 2015-08-28 — End: 2015-08-29
  Administered 2015-08-28 (×4): 100 ug via INTRAVENOUS

## 2015-08-28 MED ORDER — GLYCOPYRROLATE 0.2 MG/ML IJ SOLN
INTRAMUSCULAR | Status: AC
Start: 2015-08-28 — End: ?
  Filled 2015-08-28: qty 1

## 2015-08-28 MED ORDER — LIDOCAINE HCL 2 % IJ SOLN
INTRAMUSCULAR | Status: DC | PRN
Start: 2015-08-28 — End: 2015-08-29
  Administered 2015-08-28: 100 mg via INTRAVENOUS

## 2015-08-28 SURGICAL SUPPLY — 97 items
ADHESIVE SKIN CLOSURE DERMABOND ADVANCED (Skin Closure) ×1
ADHESIVE SKIN CLOSURE DERMABOND ADVANCED .7 ML LIQUID APPLICATOR (Skin Closure) ×1 IMPLANT
ADHESIVE SKNCLS 2 OCTYL CYNCRLT .7ML (Skin Closure) ×1
ADHESIVE TISSUE INDERMIL (Skin Closure) ×2 IMPLANT
ALLOGRAFT XEMPLIFI DBM 10CC (Bone) ×2 IMPLANT
ALLOGRAFT XEMPLIFI PLS DMB 1ML (Allograft) ×2 IMPLANT
ANGIO CATH CODM (Procedure Accessories) ×2 IMPLANT
BIT DRILL OD2.4 MM 3.5 MM SCREW ELLI (Drillbits) ×1
BIT DRILL OD2.4 MM N/A 3.5 MM SCREW ELLI (Drillbits) ×1 IMPLANT
BIT DRL 2.4MM NS REUSE 3.5MM SCR ELLI (Drillbits) ×1
BLADE CLIPPER SPECIALTY (Procedure Accessories) ×2 IMPLANT
CANNISTER SUCTION 3000CC (Suction) ×2 IMPLANT
CAP LOCKING ELLIPSE (Cap) ×14 IMPLANT
CLOSURE STERI-STRIP 1X5IN (Dressing) ×2 IMPLANT
CONTAINER SPECIMAN STERILE 5OZ (Procedure Accessories) ×2 IMPLANT
DRAPE 3/4 SHEET FANFLD 52X76IN (Drape) ×4 IMPLANT
DRAPE INST MAG 121 (Drape) ×2 IMPLANT
DRAPE PEDS W/ ARMBOARD COVERS (Drape) ×2 IMPLANT
DRILL BIT 14MM (Drillbits) ×2 IMPLANT
END CAP SPINAL LUMBAR ELLIPSE 2.7X8MM TITANIUM 182500 (Cap) ×7 IMPLANT
FORCEP BOVIE TWEEZER DISP (Instrument) ×2 IMPLANT
GAUZE SPONGE VERSLN 4PLY 4X4IN (Dressing) ×4 IMPLANT
GLOVE SURG BIOGEL INDIC SZ 8.5 (Glove) ×2 IMPLANT
GLOVE SURG BIOGEL ORTHO SZ8.5 (Glove) ×4 IMPLANT
GOWN SRG POLY 2XL ASTND LF STRL LVL 4 (Gown) ×1
GOWN SURGICAL 2XL POLY ASTOUND BLUE (Gown) ×1
GOWN SURGICAL 2XL POLY ASTOUND BLUE LEVEL 4 IMPERVIOUS REINFORCE SET (Gown) ×1 IMPLANT
GRAFT BONE FILLER FROZEN XEMPLIFI 1CC PUTTY 81030201S (Allograft) ×1 IMPLANT
GRAFT BONE FILLER XEMPLIFI 10CC PUTTY 81030210S (Bone) ×1 IMPLANT
HEMOSTAT INSTAT MCH COLLAGEN (Hemostat) ×2 IMPLANT
KIT DRAINAGE L12.5 IN ROUND 3 SPRING (Drain)
KIT DRAINAGE L12.5 IN ROUND 3 SPRING EVACUATOR Y CONNECTOR TUBE HOLE (Drain) IMPLANT
KIT DRN PVC 400CC RND 1/8IN 12.5IN LF (Drain)
NEEDLE 25GA 1 1/2 (Needles) ×2 IMPLANT
NEEDLE REG BEVEL 19GX1.5IN (Needles) ×6 IMPLANT
NEEDLE SPINAL DISP 18GX3.5IN (Needles) ×2 IMPLANT
PACK SPNE FFX (Pack) ×2 IMPLANT
PAD ELECTROSRG GRND REM W CRD (Procedure Accessories) ×2 IMPLANT
PIN DISTRACTION STRL 14MM (Procedure Accessories) IMPLANT
PLATE BN XTEND 45MM NS 3 LVL SPNE CRV (Plate) ×1 IMPLANT
PLATE L45 MM SPINE CERVICAL ANTERIOR 3 (Plate) ×1 IMPLANT
PLATE L45 MM SPINE CERVICAL ANTERIOR 3 LEVEL XTEND BONE (Plate) ×1 IMPLANT
ROD CAPTIOL CRVD 60MM (Rods) ×4 IMPLANT
ROD SPINAL POSTERIOR CERVICAL PREBENT CAPITOL 3.5X60MM TITANIUM 11186 (Rods) ×2 IMPLANT
SCREW 14X4.2 (Screw) ×8 IMPLANT
SCREW BN TI ELLIPSE 3.5MM 12MM NS PA (Screw) ×1 IMPLANT
SCREW BN TI ELLIPSE 4MM 12MM NS PA SPNE (Screw) ×1 IMPLANT
SCREW BN TI ELLIPSE 4MM 16MM NS PA SPNE (Screw) ×2 IMPLANT
SCREW BN TI ELLIPSE 4MM 18MM NS PA SPNE (Screw) IMPLANT
SCREW BN XTEND 4.2MM 16MM NS SLF DRL VA (Screw) ×2 IMPLANT
SCREW BONE L10 MM OD3.5 MM TITANIUM SPINE POLYAXIAL ELLIPSE (Screw) ×2 IMPLANT
SCREW DSTRCTN 12MM (Other) IMPLANT
SCREW ELLIPSE 3.5X14MM (Screw) ×4 IMPLANT
SCREW ELLIPSE PLYAXL 3.5X10MM (Screw) ×4 IMPLANT
SCREW GLOBU ELLIPSE 3.5X16MM (Screw) IMPLANT
SCREW GLOBU ELLIPSE 4.0X14MM (Screw) ×4 IMPLANT
SCREW L12 MM OD2.7 MM PARTIAL THREAD (Screw) ×1 IMPLANT
SCREW L12 MM OD2.7 MM PARTIAL THREAD CANNULATED T L BUTTRESS PLATES (Screw) ×1 IMPLANT
SCREW L12 MM OD3.5 MM TITANIUM SPINE (Screw) ×1 IMPLANT
SCREW L12 MM OD3.5 MM TITANIUM SPINE POLYAXIAL BONE ELLIPSE (Screw) ×1 IMPLANT
SCREW L16 MM OD4 MM TITANIUM SPINE (Screw) ×2 IMPLANT
SCREW L16 MM OD4 MM TITANIUM SPINE POLYAXIAL BONE ELLIPSE (Screw) ×2 IMPLANT
SCREW L16 MM OD4.2 MM SPINE SELF DRILL (Screw) ×2 IMPLANT
SCREW L18 MM OD4 MM TITANIUM SPINE (Screw) IMPLANT
SCREW L18 MM OD4 MM TITANIUM SPINE POLYAXIAL BONE ELLIPSE (Screw) IMPLANT
SCREW SPINAL L16 MM SELF DRILLING VARIABLE ANGLE OD4.2 MM XTEND (Screw) ×2 IMPLANT
SLEEVE SEQUEN COMP KNEE REG (Procedure Accessories) ×2 IMPLANT
SOL NACL INJ 0.9% 30ML BACTER (IV Solutions) ×2 IMPLANT
SOLUTION IRR 0.9% NACL 1000ML LF STRL (Irrigation Solutions) ×1
SOLUTION IRRIGATION 0.9% SODIUM CHLORIDE (Irrigation Solutions) ×1
SOLUTION IRRIGATION 0.9% SODIUM CHLORIDE 1000 ML PLASTIC POUR BOTTLE (Irrigation Solutions) ×1 IMPLANT
SOLUTION IV 0.9% NACL 1000ML VFLX LF PLS (IV Solutions) ×1
SOLUTION IV 0.9% SODIUM CHLORIDE PVC (IV Solutions) ×1
SOLUTION IV 0.9% SODIUM CHLORIDE PVC 1000 ML PH 5 PLASTIC CONTAINER (IV Solutions) ×1 IMPLANT
SOLUTION SRGPRP 74% ISPRP 0.7% IOD (Prep) ×1
SOLUTION SURGICAL PREP 26 ML DURAPREP (Prep) ×1
SOLUTION SURGICAL PREP 26 ML DURAPREP 74% ISOPROPYL ALCOHOL 0.7% (Prep) ×1 IMPLANT
SPNG ABSORBABLE GELATIN (Hemostat) ×2 IMPLANT
SPONGE GAUZE 12PLY STRL 4X4IN (Sponge) ×2 IMPLANT
SPONGE PEANUT C5 HOLDER DISSECTOR XRAY (Sponge) ×1
SPONGE PEANUT C5 HOLDER DISSECTOR XRAY DETECTABLE OD3/8 IN DUKAL (Sponge) ×1 IMPLANT
SPONGE PEANUT RADPQE STRL3/8IN (Sponge) ×1
STRIP SKIN CLOSURE L4 IN X W1/2 IN (Dressing) ×2
STRIP SKIN CLOSURE L4 IN X W1/2 IN REINFORCE STERI-STRIP POLYESTER (Dressing) ×2 IMPLANT
STRIP SKNCLS PLSTR STRSTRP 4X.5IN LF (Dressing) ×2
SUTURE ABS 4-0 PS2 MNCRL MTPS 18IN MFL (Suture)
SUTURE MONOCRYL 4-0 PS-2 L18 IN (Suture)
SUTURE MONOCRYL 4-0 PS-2 L18 IN MONOFILAMENT UNDYED ABSORBABLE (Suture) IMPLANT
SUTURE VICRYL 2-0 SH 27IN (Suture) IMPLANT
SUTURE VICRYL 3-0 SH 8X18IN (Suture) ×2 IMPLANT
SYRINGE LUER LOCK 10CC (Syringes, Needles) ×6 IMPLANT
TAPE SURGICAL DURAPORE 3IN (Tape) ×2 IMPLANT
TOOL DISSECTING L14 CM MATCH HEAD FLUTE (Burr) ×1
TOOL DISSECTING L14 CM MATCH HEAD FLUTE OD3 MM MIDAS REX LEGEND (Burr) ×1 IMPLANT
TOOL DSCT LGND 3MM 14MM (Burr) ×1
TUBING SUCTION 3/16X10 (Tubing) ×4 IMPLANT
WEDGE ILAC CREST 8X15MM (Bone) ×2 IMPLANT

## 2015-08-28 NOTE — Progress Notes (Addendum)
MEDICINE PROGRESS NOTE    Date Time: 08/28/2015 11:31 AM  Patient Name: Ernest Diaz  Attending Physician: Les Pou, MD    Assessment:   Principal Problem:    Thoracic spinal AV fistula  Active Problems:    Quadriplegia    Atrial fibrillation  Resolved Problems:    * No resolved hospital problems. *      Plan:   1. Fevers- Patient afebrile for past 24h. Leukocytosis resolved.    - Concern for aspiration PNA given copious secretions  Vs. UTI (E.faecalis)    - Scopolamine patch for secretions      - ID consulted.  - follow cultures  - Cont Vanc/Zosyn per ID    2. Traumatic cervicothoracic epidural hematoma s/p C3-C5 + T3-T4 decompressive laminectomies 2/27 by Dr. 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 Jaynie Collins on 08/20/15  - Neuro checks q 4 h  - Hold AC and antiplatelets per NSGY, will need clearance from Dr. Jaynie Collins prior to restarting  - Pain control with Oxycontin and prn Oxycodone, bowel regimen while on narcotics      -Notified by PT about concern for new LE weakness today.  Patient now with 0/5 strength in b/l LE, patient notes that strength has gradually diminished over course of days.     - MRI C/T/L spine ordered stat  - Left voicemail for Dr. Jaynie Collins.  Neurosurgery PA aware and will see patient.         3. Hx A Fib on Eliquis, CHADS2VASC of 2   -Rate controlled on Atenolol, bradycardia improved after decreasing dose to 25 mg q day   -Patient asymptomatic with appropriate chronotropic response  -Hold AC and antiplatelets given procedure at this time  -Discussed with patient, aware of increased risk of CVA without AC or antiplatelets however also understands contraindication from NSGY perspective given risk of bleeding into spine, will need clearance from Dr. Jaynie Collins prior to restarting as OP.        4. HTN - No acute issues  - Continue Norvasc  - Decreased Atenolol as above due to bradycardia    - Continue Maxzide   - Goal < 150/90 as outpatient           5. Urinary Retention  - Bladder scan without Foley > 400 cc, with Foley showed 80 cc  - Likely due to recent neurosurgical procedure  - Unable to have Flomax due to sulfa allergy, started on Proscar   - Will need outpatient voiding trials at SNF  - Voiding trial underway 3/16     6. Hyponatremia: Improved with fluid restriction today  -will f/u urine studies. Etiology c/f SIADH  -1.2L fluid restriction  -daily BMP    7. RUE pain: xrays negative for acute fracture    8. GI. Patient failed swallow eval today with SLP  -NPO for now  -NG tube insertion  -SLP following likely MBS next week.      DVT/GI prophylaxis - Lovenox  Case discussed with: Patient, RN, NSGY, ID.     Safety Checklist:     DVT prophylaxis:  CHEST guideline (See page [phone]) Chemical    Foley:  [place] Rn Foley protocol Not present   IVs:  Peripheral IV   PT/OT: Ordered   Daily CBC &  or Chem ordered:  SHM/ABIM guidelines (see #5) Yes, due to clinical and lab instability   Reference for approximate charges of common labs: CBC auto diff - $76  BMP - $99  Mg - $79    Lines:     Patient Lines/Drains/Airways Status    Active PICC Line / CVC Line / PIV Line / Drain / Airway / Intraosseous Line / Epidural Line / ART Line / Line / Wound / Pressure Ulcer / NG/OG Tube     Name:   Placement date:   Placement time:   Site:   Days:    Peripheral IV Right Antecubital        Antecubital       Peripheral IV 08/17/15 Left;Lateral Antecubital  08/17/15   1723   Antecubital   less than 1    Urethral Catheter               Incision Site 08/17/15 Back Upper  08/17/15   1721     less than 1                 Disposition: (Please see PAF column for Expected D/C Date)   Today's date: 08/28/2015  Admit Date: 08/17/2015  4:29 PM  LOS: 11  Clinical Milestones: Infectious workup   Anticipated discharge needs: Per PT/OT  recs      Subjective     CC: Arteriovenous fistula of spinal cord vessels    Interval History/24 hour events: NAE. Patient awakens to voice, A&O x3. Still having RUE shoulder pain.      Review of Systems:     Review of Systems - Negative except as noted above in HPI      Physical Exam:     VITAL SIGNS PHYSICAL EXAM   Temp:  [97.8 F (36.6 C)-99.5 F (37.5 C)] 98.8 F (37.1 C)  Heart Rate:  [65-78] 74  Resp Rate:  [14-32] 18  BP: (85-137)/(48-70) 96/53 mmHg      Intake/Output Summary (Last 24 hours) at 08/28/15 1131  Last data filed at 08/28/15 0500   Gross per 24 hour   Intake      0 ml   Output    970 ml   Net   -970 ml    Physical Exam  General: awake, alert X 3  Neck: Aspen collar in place  Cardiovascular: irregularly irregular,  no murmurs, rubs or gallops  Lungs: coarse breath sounds bilaterally, no wheezing, no rales, mild ronchi  Abdomen: soft, non-tender, non-distended; no palpable masses,  normoactive bowel sounds, no rebound, no guarding  Extremities: no edema  Neuro: CN grossly intact, speech fluent, follows commands, 3/5 b/l UE and 0/5 LE strength (*new)       Meds:     Medications were reviewed:    Labs:     Labs (last 72 hours):      Recent Labs  Lab 08/28/15  0433 08/27/15  0333   WBC 7.97 7.99   HGB 11.6* 11.5*   HEMATOCRIT 35.4* 34.5*   PLATELETS 271 230            Recent Labs  Lab 08/28/15  0433 08/27/15  0333  08/22/15  0355   SODIUM 134* 132* More results in Results Review 137   POTASSIUM 4.5 3.9 More results in Results Review 3.9   CHLORIDE 99* 98* More results in Results Review 100   CO2 29 24 More results in Results Review 30*   BUN 7.0* 7.0* More  results in Results Review 18.0   CREATININE 0.5* 0.5* More results in Results Review 0.6*   CALCIUM 8.5 8.1 More results in Results Review 8.2   ALBUMIN  --   --   --  2.8*   PROTEIN, TOTAL  --   --   --  5.3*   BILIRUBIN, TOTAL  --   --   --  0.7   ALKALINE PHOSPHATASE  --   --   --  62   ALT  --   --   --  25   AST (SGOT)  --   --   --  21    GLUCOSE 96 101* More results in Results Review 93   More results in Results Review = values in this interval not displayed.                Microbiology, reviewed and are significant for:  Microbiology Results     Procedure Component Value Units Date/Time    MRSA culture [[id number]] Collected:  08/17/15 2043    Specimen Information:  Body Fluid from Nares and Throat Updated:  08/18/15 0303            Imaging, reviewed and are significant for:  Xr Humerus Right Ap Lateral    08/28/2015  1. No fracture acute osseous abnormality identified in the right shoulder, humerus or forearm. Olen Pel, MD 08/28/2015 3:46 AM     Xr Forearm Complete Right    08/28/2015  1. No fracture acute osseous abnormality identified in the right shoulder, humerus or forearm. Olen Pel, MD 08/28/2015 3:46 AM     Xr Shoulder Right 2+ Views    08/28/2015  1. No fracture acute osseous abnormality identified in the right shoulder, humerus or forearm. Olen Pel, MD 08/28/2015 3:46 AM     Signed by: Les Pou, MD

## 2015-08-28 NOTE — Progress Notes (Signed)
Pt. Remains AOX4,forgetful,wanders but easily reorientable, MAE,FC.PRN OXY X2 for RUE pain.VSS,max temp. Of 99.5.On RA,no desats, productive cough.No BM.No urine output since 1900, Straight cath output @1830  per day shift nurse.Bladder scan due @0200 . Transfer report given to receiving nurse around 0030. Waiting for transport. Pt. Planned to get x-ray and go to new room. X-ray already made aware via call.

## 2015-08-28 NOTE — Progress Notes (Signed)
Neurosurgery Progress Note- EXAM CHANGE    S: called by Dr Delbert Phenix for concern for increased BLE weakness s/p cervical surgery 08/20/15 with Dr Jaynie Collins    Pt known to Dr Jaynie Collins of the Neurosurgery service. He has a hx of trauma fall 2/25 evaluated at [facility]. Initial imaging at that time negative. Upon leaving the OR became paraparetic and found to have epidural hematoma. Pt was on Eliquis and ASA at that time for Afib. At [facility] on 2/27 he underwent C3-5, T3-4 laminectomies with Dr Jaynie Collins. 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 He was transferred 3/6 for spinal angio. This was completed by IR 3/7 and read as negative for aneurysm/AVM/AVF. He was taken to the OR 3/9 by Dr Jaynie Collins for ACDF C6-7 and posterior C6-7 laminectomy for resection of spinal AV fistula and fusion. Dr Delbert Phenix was notified today by PT re: new LE weakness. Pt notes this has occurred over the last week. Reviewing the chart it unclear when the weakness started.    Pt denies new trauma. Denies pain. Denies numbness. Weakness is worse in both hands and legs.     O:  NAD sitting upright in bed. Soft collar in place. Wife and son at bedside.  Awake. Alert. Appropriate.  Motor:   BUE: 3/5 grip/triceps, 0/5 IP    5/5 biceps, 5/5 L deltoid; 4/5 R deltoid with pain and give-way weakness   BLE: 0/5 IP, quads. ADF/APF  Hyporeflexic brachial and patellas bilaterally  Foley in place    Incisions:    posterior cervical incision distal end with scab and nylon suture in place; superior end with staples. No erythema, drainage, or swelling   anterior cervical incision: steri-strips in place, not removed for evaluation; no visible erythema, drainage, or swelling    A: 70yo male s/p mechanical fall 2/25 with subsequent epidural hematoma resulting in 2/27 C3-5, T3-4 laminectomies with Dr Jaynie Collins at [facility]. Found to have venous plexus malformation at that time. Transferred 3/6 to [facility] for spinal angiogram and underwent on 3/9 ACDF  C6-7 and posterior C6-7 laminectomy for resection of spinal AV fistula and fusion with Dr Jaynie Collins. Pt noted today by PT to have increased weakness. Neurosurgery was notified.    P:  STAT cervical spine CT   Discussed with patient and family at bedside   Discussed with nurse    Make NPO now in case of OR    Hold DVT ppx, anticoags   Discussed with Dr Claudette Laws, covering for Dr Jaynie Collins  Will await imaging for further recommendations    Brunilda Payor. [facility]  Neurosurgery

## 2015-08-28 NOTE — SLP Progress Note (Signed)
[facility]   SLP Treatment Note  Patient: Ernest Diaz    MRN#: [id number]     Treatment Type: dysphagia f/u    Recommendations/Plan:   - Repeat VFSS Monday, 3/20 if patient shows improvement at bedside  - Diet Recommendations: NPO/consider corpak    SLP Frequency Recommended: 5x per week    Discharge recommendations: Rehab consult, VFSS    Assessment:   Patient presenting with worsening dysphagia over the past two days; suspect increased pharyngeal edema. Pt with 4-5 swallows per bolus, throat clearing and increased WOB. Pt's vocal quality is also weaker and cough response is ineffective and nearly absent. SLP recommends strict NPO, consider corpak. SLP will schedule repeat VFSS for Monday, 3/20 and check on patient at bedside beforehand to ensure he is appropriate to participate.    Subjective:   Pain: None; odynophagia.   Current Diet:  MA/nectar  Respiratory Status:  Hx PNA  Precautions:  Fall, aspiration, c-collar  Patient seen in bed. Reports worsening swallow and feels food/liquid is in his "windpipe" when her swallows. RN also reports coughing. Patient left with call bell within reach, all needs met, SCDs in place, fall mat in place, bed alarm activated and all questions answered.   Objective:   Objective:   Patient accepted nectar liquids by tsp x2 and puree x2. Pt with 4-5 swallows per bolus, audible swallow delay, increased WOB, throat clearing (unable to cough on cue; only throat clear). Dysphonic vocal quality. Pt brought secretions up to his mouth and SLP suctioned.     Patient Education:  Verbal; pt expressed understanding and agreement    Goals:   Patient will tolerate sips of liquid/ice chips x10 without aspiration.  Patient will complete pharyngeal exercises independently outside of tx.     Matt Liborio [place] MS, CCC-SLP     Time of Treatment:  SLP Received On: 08/28/15  Start Time: 1000  Stop Time: 1020  Time Calculation (min): 20 min

## 2015-08-28 NOTE — PT Progress Note (Signed)
[facility]   Physical Therapy Treatment  Patient: Ernest Diaz     MRN#: [id number]  Unit: [facility] 7 Bed: F726/F726.01    Discharge Recommendations:   Discharge Recommendation: SNF     DME Recommendation: TBD at rehab      If SNF recommended discharge disposition is not available, patient will need max A x 2 for functional mobility, hospital bed, hoyer lift, WC, BSC, tub bench, stretcher transport, and HHPT.        Recommendations can change; please see most recent physical therapy treatment note for updates.      Assessment:   Pt with noticeable decline today; unable to activate any muscles of LEs, 0/5 strength. Pt able to activate UE musculature in shoulders, elbows, wrists, decreased hand function unable to grip today. Pt had no trunk activation in sitting; required dep A for balance. Pt with increased anxiety today, fearful of falling and fearful of choking; requested using yankaur multiple times during session with minimal secretions in mouth. Pt c/o dizziness in sitting; BP overall low today. Notified nurse (via phone) and MD (via page) of change in status of LE musculature.     Treatment Activities: neuromuscular re-education, therapeutic exercise     Educated the patient to role of physical therapy, plan of care, goals of therapy and HEP, safety with mobility and ADLs.    Plan:   PT Frequency: 2-3x/wk    Treatment/Interventions: pre-gait/gait training as able, transfers, bed mobility, therapeutic activities, therapeutic exercises, neuromuscular re-education, functional mobility, endurance/activity tolerance, patient education and safety     Continue plan of care.       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   XRs of RUE negative for fx.     Lab Results   Component Value Date/Time    HGB 11.6* 08/28/2015 04:33 AM    HEMATOCRIT 35.4* 08/28/2015 04:33 AM    POTASSIUM 4.5 08/28/2015 04:33 AM    SODIUM 134* 08/28/2015 04:33 AM    PT INR 1.1 08/20/2015 01:40 AM     Subjective:    Patient is agreeable to participation in the therapy session. Nursing clears patient for therapy.     Pt reports "They (legs) feel frozen."    Pain:   Scale: 5/10  Location: RUE occasionally with touch/movement  Intervention: meds per nursing/ premedicated for session, positioning    Objective:   Patient presents in bed with telemetry, SCD's, peripheral IV and indwelling urinary catheter, soft c-collar, floor mats, bed alarm in place, and family present at this time.    Cognitive  Patient is alert and oriented x 3; pleasant and cooperative.     Functional Mobility  Rolling: total/dep A x 2  Supine to Sit: total A x 2  Sit to Supine: dep A x 2  Scooting: dep A x 2 to HOB  Sit to Stand: NT  Stand to Sit: NT  Transfers: supine/sit only    Ambulation  Weightbearing Status: no restrictions  Device Used: NT  Level of assistance required: NT  Ambulation Distance: NT  Pattern: NT  Stair Management: NT     Balance  Static Sitting: poor; not activating trunk  Dynamic Sitting: poor  Static Standing: NT  Dynamic Standing: NT    Therapeutic Exercises  Supine: unable to activate LE musculature as previously performed: (Quad sets, Ankle pumps, Heel slides, Gluteal sets)   Sitting:  Unable to swing for LAQs    Sitting: able to perform in UEs:  Bicep curls  "pushing/pulling" (punching/rowing motion)  Pronation/supination  Wrist flexion  Shoulder shrugs    Participation Effort  good    Endurance  Fair; BP 90s-100s/50s-60s during session; nurse reports BP low today    Pt left in bed with needs met with RN informed of status as well as outcome of PT session.    Patient left with call bell and phone within reach, SCDs, fall mat, bed alarm  in place, all needs met and all questions answered.      Goals:    Goals  Goal Formulation: With patient  Time for Goal Acheivement: 7 visits  Goals: Select goal  Pt Will Go Supine To Sit: with moderate assist  Pt Will Perform Sit To Supine: with moderate assist  Pt Will Sit Edge of Bed: 6-10 min, with moderate assist  Pt Will Achieve Sitting Balance: 2/5 supports self independently with both UEs  Pt Will Perform Home Exer Program: with minimal assist       Elliot Dally, DPT 5:28 PM 08/28/2015  Pager [phone]       Time of treatment:   PT Received On: 08/28/15  Start Time: 1630  Stop Time: 1728  Time Calculation (min): 58 min

## 2015-08-28 NOTE — Anesthesia Preprocedure Evaluation (Signed)
Anesthesia Evaluation    AIRWAY    Mallampati: III    TM distance: >3 FB  Neck ROM: limited  Mouth Opening:full   CARDIOVASCULAR    cardiovascular exam normal       DENTAL         PULMONARY    pulmonary exam normal     OTHER FINDINGS    Soft c collar on          PSS Anesthesia Comments: No problems with anesthesia        Anesthesia Plan    ASA 4 - emergent     general                     intravenous induction   Detailed anesthesia plan: general endotracheal  Monitors/Adjuncts: arterial line      Post op pain management: per surgeon    informed consent obtained      pertinent labs reviewed

## 2015-08-28 NOTE — Progress Notes (Signed)
Ernest Diaz [name] Has been released from the HELP program on 08/28/2015 after being Transferred to a Non-HELP Unit    [name], B.A., B.S.  [facility] Specialist  [name]

## 2015-08-28 NOTE — Progress Notes (Signed)
ID PROGRESS NOTE    Date Time: 08/28/2015 9:14 AM  Patient Name: Ernest Diaz        Subjective, ROS:    Fever subsiding . Tmax 99.5  Stable labs, vanc trough 7.3    Foley removed but had retention and was replaced     Sleepy but communicative   No fever   No headache. No dizziness  Still some pain in shoulders, arms   cough better , no SOB  No N/V  Foley in place        Antibiotics and culture results:   Antibiotics: zosyn #5. vanc #3      Cultures:  3/13 Ucx >100,000 E. Faecalis   3/13 Blood cx Ngtd  3/6 MRSA negative     3/9 path FIBROVASCULAR TISSUE, FEW VESSELS AND BONE CHIPS       Lines: PIV access    Physical Exam:     Filed Vitals:    08/28/15 0733   BP: 104/55   Pulse: 74   Temp: 98.8 F (37.1 C)   Resp: 18   SpO2: 98%       General: in bed, sleepy but communicative, no distress, on RA.  HEENT: moist mucosa , blind in one eye, not pale, not icteric , cervical collar in place  Scar on the neck partially examined: mild redness around the upper part. No drainage   Chest: S1S2 irregular , coarse BS, no wheeze   Abdomen:soft, not tender , BS +  Ext: no edema   Foley in place       Labs:       Recent CBC WITH DIFF   Recent Labs      08/28/15   0433   WBC  7.97   RBC  3.57*   HGB  11.6*   HEMATOCRIT  35.4*   MCV  99.2       Recent CMP   Recent Labs      08/28/15   0433   GLUCOSE  96   BUN  7.0*   CREATININE  0.5*   SODIUM  134*   POTASSIUM  4.5   CHLORIDE  99*   CO2  29           Rads:   3/17 xray No fracture acute osseous abnormality identified in the right shoulder, humerus or forearm    3/15 xray Minimal left basilar linear atelectasis or scarring.    3/8 MRI Postsurgical change with prior laminectomy. 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 There findings suspicious for a disc ligamentous injury at this level. Correlation with CT is advised    3/7 IR No angiographic evidence of intracranial aneurysm, arteriovenous malformation or dural fistula. Is no  angiographic evidence of spinal arteriovenous malformation or dural fistula.  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   Urinary retention, required reinsertion of foley.      Recent fever/SIRS on 3/12   Symptoms of aspiration with increased cough and mild-mod  oropharyngeal dysphagia   Leukocytosis    Plan:   1. SIRS.   Due to UTI an possible aspiration (given worsening of cough over last weekend and evidence of dysphagia)     On zosyn and vancomycin.   Fever subsiding now.   Continue current regimen for now/won't increase vanc dose.       If febrile again, discuss with neurosurgery to see if needs re imaging.       2. Possible aspiration pneumonia/pneumonitis.  Last xray with Minimal left basilar linear atelectasis or scarring.  On zosyn and vanc.    On RA, cough better , fever subsiding.     Continue current regimen. Would finish an 8 day course. On discharge, switch to oral antibiotics.  Aspiration precaution.     3. E. Faecalis UTI.   Cath related   Foley was removed/then replaced given retention   Would finish a 7 day course.     4. Leukocytosis.   Due to above.   Resolved.    Discussed with the patient.       Signed by: [place]. [id number]  Infectious Disease  Phone: [phone]  Fax: [phone]

## 2015-08-28 NOTE — Progress Notes (Signed)
Pt. Off the floor with transport. Xray made aware.

## 2015-08-28 NOTE — Consults (Signed)
NEUROSURGERY CONSULTATION    Date Time: 08/28/2015 10:07 PM  Patient Name: Ernest Diaz  Requesting Physician: Les Pou, MD  Consulting Physician: Michaelle Birks, MD  Covered By: Carmie End MD [phone]        Reason for Consultation:   Acute quadriplegia and hardware failure  Asked by Dr. Jaynie Collins to cover as he is out of town    History:   Ernest Diaz is a 70 y.o. 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  He made a remarkable neurological recovery after his first surgery and underwent a subsequent surgery by Dr. Jaynie Collins 3/9.  He had been improving until this week when he had increasing pain and altered mental status.  It is not clear when he stopped moving his legs but the nurse noted that this was the case today.  Work up revealed a new anterior listhesis of C6 on C7 and hardware failure of the graft.  He was unable to tolerate an MRI.  He has not been wearing his Aspen collar due to the discomfort (wearing a soft collar only).    Past Medical History:     Past Medical History   Diagnosis Date   . Hypertension    . Arthritis    . Pericarditis 1975   . Atrial fibrillation    . Arrhythmia        Past Surgical History:     Past Surgical History   Procedure Laterality Date   . Vein surgery     . Colonoscopy       x2   . 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 Eye surgery  1960     Rt eye removed   . Decompression, anterior cervical, fusion, levels 2 N/A 08/20/2015     Procedure: DECOMPRESSION, ANTERIOR CERVICAL, FUSION, LEVELS 2;  Surgeon: Madaline Savage, MD;  Location: [facility];  Service: Neurosurgery;  Laterality: N/A;  C5-C7 ACDF; AVM REMOVAL   . Decompression, posterior cervical, fusion, levels 2 N/A  08/20/2015     Procedure: DECOMPRESSION, POSTERIOR CERVICAL, FUSION, LEVELS 2;  Surgeon: Madaline Savage, MD;  Location: [facility];  Service: Neurosurgery;  Laterality: N/A;       Family History:     Family History   Problem Relation Age of Onset   . Intracerebral hemorrhage Neg Hx    . Stroke Neg Hx        Social History:     Social History     Social History   . Marital Status: Married     Spouse Name: N/A   . Number of Children: N/A   . Years of Education: N/A     Social History Main Topics   . Smoking status: Former Smoker     Quit date: 03/07/1986   . Smokeless tobacco: Never Used   . Alcohol Use: 1.2 - 1.8 oz/week     2-3 Shots of liquor per week      Comment: daily   . Drug Use: No   . Sexual Activity: Not on file     Other Topics Concern   . Not on file     Social History Narrative   wife with dementia  Son is medical decision  maker    Allergies:     Allergies   Allergen Reactions   . Sulfa Antibiotics Other (See Comments)     Lip swelling       Medications:     Current Facility-Administered Medications   Medication Dose Route Frequency   . acetylcysteine  3 mL Nebulization Q8H SCH   . amitriptyline  10 mg Oral QHS   . amLODIPine  10 mg Oral Daily   . atenolol  25 mg Oral Daily   . balsam peru-castor oil (VENELEX)   Topical Q12H SCH   . enoxaparin  40 mg Subcutaneous Daily   . finasteride  5 mg Oral QHS   . piperacillin-tazobactam  4.5 g Intravenous 4 times per day   . polyethylene glycol  17 g Oral Daily   . scopolamine  1 patch Transdermal Q72H   . senna-docusate  1 tablet Oral BID   . triamterene-hydrochlorothiazide  1 tablet Oral Daily   . vancomycin  1,000 mg Intravenous Q12H [facility]       Review of Systems:   A comprehensive review of systems was: +cough; quadriplegia; neck pain    Physical Exam:     Filed Vitals:    08/28/15 2139   BP: 120/62   Pulse:    Temp:    Resp:    SpO2:        Intake and Output Summary (Last 24 hours) at Date Time    Intake/Output Summary (Last 24 hours) at 08/28/15 2207  Last  data filed at 08/28/15 1300   Gross per 24 hour   Intake      0 ml   Output   1470 ml   Net  -1470 ml       Neuro exam:   Alert, awake, oriented x person, place             Speech: fluent  Follow commands (y/n): yes  Confused at times                 Motor:     RUE  Delt:4  Bicep:3 Tricep:3 wrist extensors:3 wrist flexor:2  Iintrinsics:2   LUE Delt:5  Bicep:4 Tricep:4 wrist extensors:3 wrist flexor:2  Iintrinsics:2   RLE Psoas:0 Quad :0 Hamstring:0  DF:0 PF:0 EHL:0   LLE Psoas:1 Quad :0 Hamstring:0  DF:0 PF:0 EHL:0     Sensory:  Sensory level  Pain/temperature: decreased  Vibration/proprioception: absent at feet        Reflexes/Babinski/Clonus/Hoffmann's: +Babinski; no clonus     GCS:14    Labs Reviewed:   Na+ 134; Hg 11.6    Rads:   Radiological Procedure reviewed.   CT neck: jumped facet C6-7 with anterior hardware displacement  Assessment:   Acute profound aggravation of quadriparesis   From failure of hardware and jumped facets at C6-7   Unable to tolerate MRI to rule out EDH but given the grade II spondylolisthesis, it will be best to redo his anterior construct and reduce the spinal deformity   He is still incomplete but Frankel grade C    Plan:   To OR for redo anterior spinal fusion and possible reduction of spinal dislocation   Dr. Jaynie Collins aware   Informed consent obtained from his son   Patient is aware and agrees but is still in denial about his leg paralysis  Signed by: Brunetta Genera, MD

## 2015-08-28 NOTE — Progress Notes (Signed)
DISCHARGE PLAN:  [facility], the son toured the facility and the plan is for the patient and wife who has Alzheimer's to stay with the patient. The facility is aware and on board with this plan. Call admissions at [phone] if d/c's during the weekend.     SNF REFERRALS: referrals updated in [facility]    TRANSPORTATION UPON DISCHARGE: [facility]    NEXT STEPS: patient pending medical clearance     KEY CONTACTS: main contact  Ernest Diaz, ARVISO   [phone] Son             Sindy Messing, MSW  Case Management  Care Coordinator  [facility]  T [phone] F [phone]

## 2015-08-28 NOTE — Plan of Care (Addendum)
Problem: Safety  Goal: Patient will be free from injury during hospitalization  Outcome: Progressing    Problem: Pain  Goal: Patient's pain/discomfort is manageable  Outcome: Progressing    Comments:   Received pt approx 0145 from [facility]. Pt alert and oriented x3, forgetful @times  and disoriented to the month, week grips strength UE, LE doesn't resist gravity. Xray done, no FX. Bladder scanned pt @0200 , 628cc, notified MD, foley in per MD order, postvoid 670cc. Soft cervical collar on. VSS, Afib on tele, rate controlled. denies pain at this moment.  orientated pt to room. Call light in reach, instructed to call for help.  Fall precaution maintained. Will continue to monitor.

## 2015-08-28 NOTE — Plan of Care (Signed)
Problem: Pain  Goal: Patient's pain/discomfort is manageable  Outcome: Progressing  Patient alert and oriented x 3, follows command. Patient c/o pain in R shoulder and neck. PRN medicine given with some desired outcome. Patient not safe to swallow this morning. Speech re evaluate done. NPO at the moment. corpak inserted. Foley intact and draining appropriately. Avasys in place. Patient unable to use call light button. Repositions q 2 hours. No BM this shift. MRI form sent. Family at bedside. Fall and safety precaution in place. Will continue to monitor and keep the patient safe.

## 2015-08-28 NOTE — H&P (Signed)
[facility]- Neuro Critical Care Service [facility])                                                       Admission/Consultation  Note        Date Time: 08/28/2015 10:24 PM  Patient Name: Ernest Diaz  Attending Physician: Les Pou, MD  Room: [phone]  Admit Date: 08/17/2015  LOS: 11 days      No chief complaint on file.    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 He was brought to [facility] and underwent angiography 3/6 followed by ACDF C6-7 and posterior laminectomy of the C6-7 on 3/9. The patient has developed weakness over the last few days in the lower and upper extremities and is being evaluated for urgent surgical decompression and correction of anterolisthesis of C6 upon C7 noted on the CT scan done urgently today.       ASSESSMENT               PLAN  Admit to ICU for maintenance of Spinal perfusion.   Urgent surgical evaluation and management underway by Dr Claudette Laws at this time.   Urinary retention  UTI with E fecalis 3/13\  Dysphagia  Atrial fibrillation  Hypertension      Baseline MRS NEURO: Admit to NSICU after surgical intervention.   Appreciate input by Dr Claudette Laws.     -Goals:   Perfusion goals: MAP>80  Na+ goal: 135-145  Pain/Sedation: as needed.   Drains: per surgery  PT/OT/Speech/swallow evaluations will be ordered post operatively per guidelines from neurosurgery.     CV: Hold antihypertensives while spine perfusion is at risk.   Reverse anticoagulation.     PULM: at risk for aspiration pneumonia.     GI: NPO until further orders.   GI PPX the patient is at risk for stress ulcers, and will be started on Famotidine.   Nutrition: The patient was getting tube feeds, will be resumed post surgery.     RENAL: Follow electrolytes and renal function.   I/O  Goal: autoregulate.     ID: continue Vancomycin and Zosyn per ID service for presumed aspiration pneumonia and UTI.     HEME: follow CBC.     SCD  Pharmacologic ppx:  not indicated due to spinal hemorrhage.      ENDO/RHEUM:   Glucose: <180n with SSI if needed.     Lines/Foley: PIV, Foley for retention.   Daily labs: ordered.     Family Meeting:   Patient is currently able to make medical decisions.    Discussed current diagnoses, prognosis and goals of care.   Code status was also discussed and is Full at this time.   Code status Full Code    DISPO:ICU/OR    Discussed with Trauma team, patient stays on neurocritical care service.     HPI   History obtained from: Patient, charts and Neurosurgical team.   Patient transferred from another facility:[place]  Symptom ones date and time(specify last seen nl or  witnessed)     ED MEDS:   Medications   naloxone (NARCAN) injection 0.2 mg ( Intravenous MAR  Unhold 08/20/15 2205)   ondansetron (ZOFRAN) injection 4 mg ( Intravenous MAR Unhold 08/20/15 2205)   calcium carbonate (TUMS) chewable tablet 1,000 mg ( Oral MAR Unhold 08/20/15 2205)   senna-docusate (PERICOLACE) 8.6-50 MG per tablet 2 tablet (2 tablets Oral Given 08/24/15 2213)   0.9%  NaCl infusion ( Intravenous Rate/Dose Verify 08/18/15 1200)   amLODIPine (NORVASC) tablet 10 mg (10 mg Oral Not Given 08/28/15 0926)   triamterene-hydrochlorothiazide (MAXZIDE-25) 37.5-25 MG per tablet 1 tablet (1 tablet Oral Not Given 08/28/15 0935)   amitriptyline (ELAVIL) tablet 10 mg (10 mg Oral Given 08/28/15 2146)   hydrALAZINE (APRESOLINE) injection 5 mg ( Intravenous MAR Unhold 08/20/15 2205)   labetalol (NORMODYNE,TRANDATE) injection 10 mg ( Intravenous MAR Unhold 08/20/15 2205)   atenolol (TENORMIN) tablet 25 mg (25 mg Oral Not Given 08/28/15 0932)   dextrose 5 % and 0.45% NaCl 1,000 mL with m.v.i. adult 10 mL infusion ( Intravenous New Bag 08/20/15 0122)   senna-docusate (PERICOLACE) 8.6-50 MG per tablet 1 tablet (1 tablet Oral Not Given 08/28/15  1800)   benzocaine-menthol (CEPACOL) lozenge 1 lozenge (1 lozenge Buccal Given 08/23/15 0825)   methocarbamol (ROBAXIN) tablet 750 mg (750 mg Oral Given 08/26/15 1300)   finasteride (PROSCAR) tablet 5 mg (5 mg Oral Given 08/28/15 2146)   enoxaparin (LOVENOX) syringe 40 mg (40 mg Subcutaneous Given 08/28/15 0934)   acetaminophen (TYLENOL) tablet 325 mg (325 mg Oral Given 08/27/15 1430)   oxyCODONE (ROXICODONE) immediate release tablet 15 mg (15 mg Oral Given 08/28/15 0055)   scopolamine (TRANSDERM-SCOP) 1.5 mg 1.5 mg 1 patch (1 patch Transdermal Patch Applied 08/26/15 2156)   piperacillin-tazobactam (ZOSYN) 4.5 g in sodium chloride 0.9 % 100 mL IVPB mini-bag plus (4.5 g Intravenous New Bag 08/28/15 1758)   polyethylene glycol (MIRALAX) packet 17 g (17 g Oral Not Given 08/28/15 0934)   bisacodyl (DULCOLAX) suppository 10 mg (10 mg Rectal Given 08/25/15 2125)   vancomycin (VANCOCIN) 1000 mg in sodium chloride 0.9% 250 mL IVPB (1,000 mg Intravenous New Bag 08/28/15 2204)   balsam peru-castor oil (VENELEX) ointment ( Topical Given 08/28/15 2146)   0.9%  NaCl infusion ( Intravenous New Bag 08/28/15 1324)   morphine injection 2 mg (2 mg Intravenous Given by Other 08/28/15 2139)   acetylcysteine (MUCOMYST) 10 % nebulizer solution 3 mL (3 mLs Nebulization Not Given 08/28/15 2210)   albuterol (PROVENTIL) nebulizer solution 2.5 mg (not administered)   buffered lidocaine (XYLOCAINE) 1 % solution (4 mLs Intradermal Given 08/18/15 1420)   fentaNYL (PF) (SUBLIMAZE) injection 25 mcg (25 mcg Intravenous Given 08/18/15 1650)   fentaNYL (PF) (SUBLIMAZE) 0.05 MG/ML injection (  Override Pull 08/18/15 1626)   HYDROmorphone (DILAUDID) 1 MG/ML injection (  Override Pull 08/18/15 1709)   iohexol (OMNIPAQUE) 240 MG/ML solution 259 mL (259 mLs Other Imaging Agent Given 08/18/15 1500)   gadobutrol (GADAVIST) injection SOLN 9 mmol (9 mmol Intravenous Imaging Agent Given 08/19/15 2218)   HYDROmorphone (DILAUDID) injection 0.4 mg (0.4 mg Intravenous Given 08/20/15 1120)    fentaNYL (PF) (SUBLIMAZE) injection 25 mcg (25 mcg Intravenous Given 08/20/15 2023)   ceFAZolin (ANCEF) 2 g IVPB 50 mL (premix) (2 g Intravenous New Bag 08/21/15 1011)   barium sulfate (VARIBAR THIN LIQUID) 40 % suspension 80 mL (80 mLs Oral Imaging Agent Given 08/25/15 0925)   barium sulfate (VARIBAR PUDDING) 40 % oral paste 20 mL (20 mLs Oral Imaging Agent Given 08/25/15 0926)  Review of Systems     Comprehensive review of more than 10 systems including constitutional, eyes, ears, nose, mouth, throat, cardiovascular, GI, GU, musculoskeletal, integumentary, respiratory, neurologic, psychiatric, and endocrine is negative other than what is mentioned already above and in the history of present illness.    Past Medical Hx     Past Medical History   Diagnosis Date   . Hypertension    . Arthritis    . Pericarditis 1975   . Atrial fibrillation    . Arrhythmia         Past Surgical Hx:     Past Surgical History   Procedure Laterality Date   . Vein surgery     . Colonoscopy       x2   . 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 Eye surgery  1960     Rt eye removed   . Decompression, anterior cervical, fusion, levels 2 N/A 08/20/2015     Procedure: DECOMPRESSION, ANTERIOR CERVICAL, FUSION, LEVELS 2;  Surgeon: Madaline Savage, MD;  Location: [place] [facility];  Service: Neurosurgery;  Laterality: N/A;  C5-C7 ACDF; AVM REMOVAL   . Decompression, posterior cervical, fusion, levels 2 N/A 08/20/2015     Procedure: DECOMPRESSION, POSTERIOR CERVICAL, FUSION, LEVELS 2;  Surgeon: Madaline Savage, MD;  Location: [place] [facility];  Service: Neurosurgery;  Laterality: N/A;        Allergies   Sulfa antibiotics    Meds   HOME :   Prior to Admission medications    Medication Sig Start Date End Date Taking? Authorizing Provider   amitriptyline (ELAVIL) 10 MG tablet Take 10 mg by mouth nightly.  Started at [facility] 08/2015   Yes [provider]   amLODIPine (NORVASC) 10 MG tablet Take 10 mg by mouth daily.   Yes [provider]   Calcium Carb-Cholecalciferol (CALCIUM 600 + D PO) Take by mouth daily.   Yes [provider]   Cholecalciferol (VITAMIN D3) 1000 UNITS Cap Take by mouth every mon, wed and fri.   Yes [provider]   Flaxseed, Linseed, (FLAX SEEDS PO) Take by mouth daily.   Yes [provider]   folic acid (FOLVITE) 400 MCG tablet Take 400 mcg by mouth daily.   Yes [provider]   Magnesium 300 MG Cap Take by mouth daily.   Yes [provider]   Multiple Vitamins-Minerals (CENTRUM SILVER ADULT 50+ PO) Take by mouth daily.   Yes [provider]   Omega-3 Fatty Acids (FISH OIL) 1200 MG Cap Take by mouth daily.   Yes [provider]   triamterene-hydrochlorothiazide (MAXZIDE-25) 37.5-25 MG per tablet Take 1 tablet by mouth daily.   Yes [provider]   acetaminophen (TYLENOL) 325 MG tablet Take 1 tablet (325 mg total) by mouth every 4 (four) hours as needed. 08/22/15   Isaias Cowman, MD   atenolol (TENORMIN) 50 MG tablet Take 0.5 tablets (25 mg total) by mouth daily. 08/22/15   Isaias Cowman, MD   finasteride (PROSCAR) 5 MG tablet Take 1 tablet (5 mg total) by mouth nightly. 08/22/15   Isaias Cowman, MD   methocarbamol (ROBAXIN) 750 MG tablet Take 1 tablet (750 mg total) by mouth 3 (three) times daily as needed. 08/22/15   Isaias Cowman, MD   oxyCODONE  10 MG Tab Take 1 tablet (10 mg total) by mouth every 4 (four) hours as needed for Pain. 08/22/15   Isaias Cowman, MD   senna-docusate (PERICOLACE) 8.6-50 MG per tablet Take 1 tablet by mouth 2 (two) times daily. 08/22/15   Isaias Cowman, MD          Social Hx     Social History     Social History   . Marital Status: Married     Spouse Name: N/A   . Number of Children: N/A   . Years of Education: N/A     Occupational History   . Not on  file.     Social History Main Topics   . Smoking status: Former Smoker     Quit date: 03/07/1986   . Smokeless tobacco: Never Used   . Alcohol Use: 1.2 - 1.8 oz/week     2-3 Shots of liquor per week      Comment: daily   . Drug Use: No   . Sexual Activity: Not on file     Other Topics Concern   . Not on file     Social History Narrative   Premorbid functional status: IADL    Family Hx     Family History   Problem Relation Age of Onset   . Intracerebral hemorrhage Neg Hx    . Stroke Neg Hx        Advanced Directives   Full code    Physical Exam:     Filed Vitals:    08/28/15 2139   BP: 120/62   Pulse:    Temp:    Resp:    SpO2:         Intake/Output Summary (Last 24 hours) at 08/28/15 2224  Last data filed at 08/28/15 1300   Gross per 24 hour   Intake      0 ml   Output   1470 ml   Net  -1470 ml     Vent Settings:       Line, Drains, Airway:    General Appearance:    Mental status:  Sedation: None                Opens eyes (spont/voice/pain/none): spontaneous      Orientation: Oriented to person, place, situation, not to time.   Language: Fluent in english          Cranial Nerves  Pupils  OS: size/reactivity: 3 r  mm            OD: size/reactivity: blind since age 28                EOM(Diaz/IV/VI):  Intact with left eye.                                Visual field: intact with left eye      Corneal(V/VII): intact in left eye        Face(VII): symmetric  Shoulder shrug/head turn(XI): 4/5         Cough/gag(IX/X): present                     Uvula (IX/X): not checked.                  Tongue(XII): midline        MOTOR  Upper Extremity   Drift   LEFT(of 5) 3/5 FC  RIGHT(of 5) 3/5 FC        Lower Extremity    LEFT(of 5) 0   RIGHT(of 5) 0   Pronator drift: unable to assess.     Sensory: absent sensation below T7  Cerebellar: no nystagmus or tremor.   Reflexes/Tone/Babinki: not checked.   HEENT: NC, AT, no icterus  CV: Irregular atrial fibrillation.   PULM: diminished at bases.   GI: soft with bowel sounds.   Ext: Has  edema but no cyanosis.     Labs:   Hematology:  Recent Labs      08/28/15   0433  08/27/15   0333   WBC  7.97  7.99   HGB  11.6*  11.5*   HEMATOCRIT  35.4*  34.5*   PLATELETS  271  230     No results for input(s): PT, INR, PTT in the last 72 hours.  Chemistry:  Recent Labs      08/28/15   0433  08/27/15   0333   SODIUM  134*  132*   POTASSIUM  4.5  3.9   BUN  7.0*  7.0*   CREATININE  0.5*  0.5*   GLUCOSE  96  101*     Rads:   Radiological Imaging personally reviewed  Xr Humerus Right Ap Lateral    08/28/2015  1. No fracture acute osseous abnormality identified in the right shoulder, humerus or forearm. Olen Pel, MD 08/28/2015 3:46 AM     Xr Forearm Complete Right    08/28/2015  1. No fracture acute osseous abnormality identified in the right shoulder, humerus or forearm. Olen Pel, MD 08/28/2015 3:46 AM     Ct Cervical Spine Wo Contrast    08/28/2015  ADDENDUM:  On rereview of imaging, there are bilateral jumped facets.  Neldon Mc, MD 08/28/2015 10:41 PM     08/28/2015  1.  Markedly abnormal examination with anterolisthesis of C6 upon C7 measuring 16 mm with a jumped facet on the left and a comminuted displaced fracture involving the right C7 articular facet. 2.  Anterior cervical spinal fusion hardware is malpositioned with both plate and screws extending into the C6-C7 disc space. 3.  The AP dimension of the bony spinal canal at the superior C5 level is reduced to 5 mm. These critical results were discussed with Payton Spark, PA at 9:36 PM on 08/28/2015. Neldon Mc, MD 08/28/2015 9:36 PM     Xr Shoulder Right 2+ Views    08/28/2015  1. No fracture acute osseous abnormality identified in the right shoulder, humerus or forearm. Olen Pel, MD 08/28/2015 3:46 AM     Xr Abdomen Portable    08/28/2015   Tip of the enteric tube in the body of the stomach. Colonel Bald, MD 08/28/2015 5:38 PM     Xr Abdomen Portable    08/28/2015   Nasogastric tube tip at or just below the gastroesophageal junction. Remer Macho, MD 08/28/2015 2:45 PM     I have personally reviewed the patient's history and 24 hour interval events, along with vitals, labs, radiology images. So far today I have spent 50 minutes providing care for this patient excluding teaching and billable procedures, and not overlapping with any other providers    Signed by: Eden Emms, MD  Date/Time: 08/28/2015 10:24 PM  Neuro Critical Care.   Attending MD [phone] (24hr) or [phone]  Midlevel Provider [phone] or [phone]

## 2015-08-29 ENCOUNTER — Inpatient Hospital Stay: Payer: Medicare Other

## 2015-08-29 LAB — CVOR1 AGNKC GL THGB CLWBL
Arterial Total CO2: 22.9 mEq/L — ABNORMAL LOW (ref 24.0–30.0)
Arterial Total CO2: 21.9 mEq/L — ABNORMAL LOW (ref 24.0–30.0)
Base Excess, Arterial: -1.3 mEq/L (ref <=2.0)
Base Excess, Arterial: -2.9 mEq/L — ABNORMAL LOW (ref <=2.0)
Calcium, Ionized: 2.57 mEq/L (ref 2.30–2.58)
Calcium, Ionized: 2.22 mEq/L — ABNORMAL LOW (ref 2.30–2.58)
Chloride, WB: 104 mEq/L (ref 98–106)
Chloride, WB: 104 mEq/L (ref 98–106)
HCO3, Arterial: 21.9 mEq/L — ABNORMAL LOW (ref 23.0–29.0)
HCO3, Arterial: 20.8 mEq/L — ABNORMAL LOW (ref 23.0–29.0)
Hematocrit Total Calculated: 31.7 % — ABNORMAL LOW (ref 40.0–54.0)
Hematocrit Total Calculated: 32 % — ABNORMAL LOW (ref 40.0–54.0)
Hemoglobin Total: 10.3 g/dL — ABNORMAL LOW (ref 13.0–17.0)
Hemoglobin Total: 10.4 g/dL — ABNORMAL LOW (ref 13.0–17.0)
O2 Sat, Arterial: 99.7 % (ref 95.0–100.0)
O2 Sat, Arterial: 99.8 % (ref 95.0–100.0)
Temperature: 37
Temperature: 37
Whole Blood Glucose: 103 mg/dL — ABNORMAL HIGH (ref 70–100)
Whole Blood Glucose: 106 mg/dL — ABNORMAL HIGH (ref 70–100)
Whole Blood Potassium: 3.7 mEq/L (ref 3.5–5.3)
Whole Blood Potassium: 4.4 mEq/L (ref 3.5–5.3)
Whole Blood Sodium: 137 mEq/L (ref 136–146)
Whole Blood Sodium: 139 mEq/L (ref 136–146)
pCO2, Arterial: 33 mmhg — ABNORMAL LOW (ref 35.0–45.0)
pCO2, Arterial: 34.3 mmhg — ABNORMAL LOW (ref 35.0–45.0)
pH, Arterial: 7.438 (ref 7.350–7.450)
pH, Arterial: 7.4 (ref 7.350–7.450)
pO2, Arterial: 212 mmhg — ABNORMAL HIGH (ref 80.0–90.0)
pO2, Arterial: 210 mmhg — ABNORMAL HIGH (ref 80.0–90.0)

## 2015-08-29 LAB — CBC AND DIFFERENTIAL
Basophils Absolute Automated: 0.01 10*3/uL (ref 0.00–0.20)
Basophils Automated: 0 %
Eosinophils Absolute Automated: 0 10*3/uL (ref 0.00–0.70)
Eosinophils Automated: 0 %
Hematocrit: 31.5 % — ABNORMAL LOW (ref 42.0–52.0)
Hgb: 10.1 g/dL — ABNORMAL LOW (ref 13.0–17.0)
Immature Granulocytes Absolute: 0.03 10*3/uL
Immature Granulocytes: 0 %
Lymphocytes Absolute Automated: 0.47 10*3/uL — ABNORMAL LOW (ref 0.50–4.40)
Lymphocytes Automated: 6 %
MCH: 31.9 pg (ref 28.0–32.0)
MCHC: 32.1 g/dL (ref 32.0–36.0)
MCV: 99.4 fL (ref 80.0–100.0)
MPV: 9.7 fL (ref 9.4–12.3)
Monocytes Absolute Automated: 0.11 10*3/uL (ref 0.00–1.20)
Monocytes: 1 %
Neutrophils Absolute: 7.54 10*3/uL (ref 1.80–8.10)
Neutrophils: 92 %
Nucleated RBC: 0 /100 WBC (ref 0–1)
Platelets: 246 10*3/uL (ref 140–400)
RBC: 3.17 10*6/uL — ABNORMAL LOW (ref 4.70–6.00)
RDW: 14 % (ref 12–15)
WBC: 8.16 10*3/uL (ref 3.50–10.80)

## 2015-08-29 LAB — BASIC METABOLIC PANEL
BUN: 9 mg/dL (ref 9.0–28.0)
CO2: 19 mEq/L — ABNORMAL LOW (ref 22–29)
Calcium: 8.2 mg/dL (ref 7.9–10.2)
Chloride: 104 mEq/L (ref 100–111)
Creatinine: 0.5 mg/dL — ABNORMAL LOW (ref 0.7–1.3)
Glucose: 133 mg/dL — ABNORMAL HIGH (ref 70–100)
Potassium: 4 mEq/L (ref 3.5–5.1)
Sodium: 138 mEq/L (ref 136–145)

## 2015-08-29 LAB — TYPE AND SCREEN
AB Screen Gel: NEGATIVE
ABO Rh: A NEG

## 2015-08-29 LAB — GFR: EGFR: >60

## 2015-08-29 MED ORDER — GLYCOPYRROLATE 0.2 MG/ML IJ SOLN
INTRAMUSCULAR | Status: DC | PRN
Start: 2015-08-29 — End: 2015-08-29

## 2015-08-29 MED ORDER — HYDRALAZINE HCL 20 MG/ML IJ SOLN
10.0000 mg | INTRAMUSCULAR | Status: DC | PRN
Start: 2015-08-29 — End: 2015-09-24
  Administered 2015-09-08: 10 mg via INTRAVENOUS
  Filled 2015-08-29: qty 1

## 2015-08-29 MED ORDER — GLYCOPYRROLATE 0.2 MG/ML IJ SOLN
INTRAMUSCULAR | Status: AC
Start: 2015-08-29 — End: ?
  Filled 2015-08-29: qty 1

## 2015-08-29 MED ORDER — MIDODRINE HCL 5 MG PO TABS
10.0000 mg | ORAL_TABLET | Freq: Two times a day (BID) | ORAL | Status: DC
Start: 2015-08-30 — End: 2015-08-31
  Administered 2015-08-30 – 2015-08-31 (×3): 10 mg via ORAL
  Filled 2015-08-29 (×3): qty 2

## 2015-08-29 MED ORDER — PROPOFOL 10 MG/ML IV EMUL (WRAP)
INTRAVENOUS | Status: AC
Start: 2015-08-29 — End: ?
  Filled 2015-08-29: qty 20

## 2015-08-29 MED ORDER — FAMOTIDINE 20 MG/2ML IV SOLN
INTRAVENOUS | Status: AC
Start: 2015-08-29 — End: ?
  Filled 2015-08-29: qty 2

## 2015-08-29 MED ORDER — FLUDROCORTISONE ACETATE 0.1 MG PO TABS
0.2000 mg | ORAL_TABLET | Freq: Every day | ORAL | Status: DC
Start: 2015-08-29 — End: 2015-09-08
  Administered 2015-08-29 – 2015-09-08 (×8): 0.2 mg via ORAL
  Filled 2015-08-29 (×10): qty 2

## 2015-08-29 MED ORDER — FENTANYL CITRATE (PF) 50 MCG/ML IJ SOLN (WRAP)
INTRAMUSCULAR | Status: AC
Start: 2015-08-29 — End: ?
  Filled 2015-08-29: qty 2

## 2015-08-29 MED ORDER — CALCIUM CHLORIDE 10 % IV SOLN
INTRAVENOUS | Status: DC | PRN
Start: 2015-08-29 — End: 2015-08-29
  Administered 2015-08-29 (×2): 250 mg via INTRAVENOUS

## 2015-08-29 MED ORDER — HYDROMORPHONE HCL 1 MG/ML IJ SOLN
INTRAMUSCULAR | Status: AC
Start: 2015-08-29 — End: ?
  Filled 2015-08-29: qty 1

## 2015-08-29 MED ORDER — GLYCOPYRROLATE 0.2 MG/ML IJ SOLN
INTRAMUSCULAR | Status: DC | PRN
Start: 2015-08-29 — End: 2015-08-29
  Administered 2015-08-29: .1 mg via INTRAVENOUS
  Administered 2015-08-29: .6 mg via INTRAVENOUS

## 2015-08-29 MED ORDER — PROPOFOL 10 MG/ML IV EMUL (WRAP)
INTRAVENOUS | Status: AC
Start: 2015-08-29 — End: ?
  Filled 2015-08-29: qty 100

## 2015-08-29 MED ORDER — NEOSTIGMINE METHYLSULFATE 1 MG/ML IJ SOLN
INTRAMUSCULAR | Status: DC | PRN
Start: 2015-08-29 — End: 2015-08-29
  Administered 2015-08-29: 3 mg via INTRAVENOUS

## 2015-08-29 MED ORDER — FAMOTIDINE 10 MG/ML IV SOLN (WRAP)
20.0000 mg | Freq: Two times a day (BID) | INTRAVENOUS | Status: DC
Start: 2015-08-29 — End: 2015-09-24
  Administered 2015-08-30 – 2015-09-20 (×21): 20 mg via INTRAVENOUS
  Filled 2015-08-29 (×22): qty 2

## 2015-08-29 MED ORDER — GLYCOPYRROLATE 0.2 MG/ML IJ SOLN
INTRAMUSCULAR | Status: AC
Start: 2015-08-29 — End: ?
  Filled 2015-08-29: qty 2

## 2015-08-29 MED ORDER — SODIUM CHLORIDE 0.9 % IV SOLN
INTRAVENOUS | Status: DC | PRN
Start: 2015-08-28 — End: 2015-08-29

## 2015-08-29 MED ORDER — PROPOFOL INFUSION 10 MG/ML
INTRAVENOUS | Status: DC | PRN
Start: 2015-08-29 — End: 2015-08-29
  Administered 2015-08-29: 50 ug/kg/min via INTRAVENOUS

## 2015-08-29 MED ORDER — FAMOTIDINE 20 MG PO TABS
20.0000 mg | ORAL_TABLET | Freq: Two times a day (BID) | ORAL | Status: DC
Start: 2015-08-29 — End: 2015-09-24
  Administered 2015-08-29 – 2015-09-24 (×32): 20 mg via ORAL
  Filled 2015-08-29 (×32): qty 1

## 2015-08-29 MED ORDER — DEXAMETHASONE SODIUM PHOSPHATE 20 MG/5ML IJ SOLN
INTRAMUSCULAR | Status: AC
Start: 2015-08-29 — End: ?
  Filled 2015-08-29: qty 5

## 2015-08-29 MED ORDER — ALBUMIN HUMAN 5 % IV SOLN
INTRAVENOUS | Status: DC | PRN
Start: 2015-08-28 — End: 2015-08-29

## 2015-08-29 MED ORDER — HYDROMORPHONE HCL 1 MG/ML IJ SOLN
0.5000 mg | INTRAMUSCULAR | Status: DC | PRN
Start: 2015-08-29 — End: 2015-09-07
  Administered 2015-08-29 – 2015-09-04 (×4): 0.5 mg via INTRAVENOUS
  Filled 2015-08-29 (×5): qty 1

## 2015-08-29 MED ORDER — DEXAMETHASONE SODIUM PHOSPHATE 4 MG/ML IJ SOLN (WRAP)
INTRAMUSCULAR | Status: DC | PRN
Start: 2015-08-29 — End: 2015-08-29
  Administered 2015-08-29: 10 mg via INTRAVENOUS

## 2015-08-29 MED ORDER — HYDROMORPHONE HCL 1 MG/ML IJ SOLN
INTRAMUSCULAR | Status: DC | PRN
Start: 2015-08-29 — End: 2015-08-29
  Administered 2015-08-29 (×2): .5 mg via INTRAVENOUS

## 2015-08-29 MED ORDER — ROCURONIUM BROMIDE 50 MG/5ML IV SOLN
INTRAVENOUS | Status: AC
Start: 2015-08-29 — End: ?
  Filled 2015-08-29: qty 5

## 2015-08-29 MED ORDER — PLASMA-LYTE A IV SOLN
1000.0000 mL | Freq: Once | INTRAVENOUS | Status: AC
Start: 2015-08-29 — End: 2015-08-29
  Administered 2015-08-29: 1000 mL via INTRAVENOUS

## 2015-08-29 MED ORDER — MICROFIBRILLAR COLL HEMOSTAT EX POWD
CUTANEOUS | Status: DC | PRN
Start: 2015-08-29 — End: 2015-08-29
  Administered 2015-08-29: 1 g via TOPICAL

## 2015-08-29 NOTE — Op Note (Signed)
Procedure Date: 08/29/2015     Patient Type: I     SURGEON: Cheral Almas MD  ASSISTANT:       FIRST ASSISTANT:  Karoline Caldwell, SA     PREOPERATIVE DIAGNOSIS:  Fracture dislocation C6-C7 with malpositioned hardware      POSTOPERATIVE DIAGNOSIS:  Fracture dislocation C6-C7 with malpositioned hardware      TITLES OF PROCEDURES:  1.  Anterior cervical removal of retained hardware C6-C7.  2.  Redo posterior cervical decompression and internal reduction and  fixation of fracture dislocation with arthrodesis at C5 to T1 using  instrumentation.  3.  Anterior cervical corpectomy partial C7 and arthrodesis C5 to C7 using  bone bank bone and Globus anterior instrumentation.     ANESTHESIA:  General endotracheal.     SPECIMENS:  Culture of posterior cervical wound.     INDICATIONS:  This is a 70 year old man who suffered several neurological insults  starting with an epidural hematoma, and a fracture dislocation at C6-C7.   He had residual quadriplegia, but did recover somewhat after evacuation of  epidural hematoma and underwent a definitive arthrodesis; however, a week  after surgery he failed mechanically and suffered a fracture dislocation of  the neck and that the hardware could not hold.  He and his son understand  the risks and indications for surgery and the unlikelihood that he will get  significant function back since he was densely paretic preoperative, and he  agreed to proceed.     DESCRIPTION OF PROCEDURE:  The patient was taken to the operating room where his trachea was carefully  intubated using fiberoptic technique by the anesthesiologist.  General  endotracheal anesthesia was induced and maintained throughout the case.  He  was first positioned supine on the operative table.  His anterior neck was  prepped and draped in usual sterile fashion.  His preexisting incision was  opened.  The approach was difficult due to woody induration of the surgical  corridor; however, we were able to expose the anterior hardware.   We  removed the Globus coalition plate and graft construct, which had been  kicked out by the fracture dislocation.  The screws were no longer in bone.   There was no injury; however, to the esophagus that was apparent.  This  construct was simply lifted out.  At this point, we tried to perform an  anterior reduction but there was too much of anterolisthesis of C6 on C7,  over 50%, which made it impossible to throw a graft in or to reduce it.  We  tried several techniques, but failed.  It was clear that we needed to  reduce the facet dislocation posteriorly.  Therefore, we closed the  anterior incision.  We then placed the patient in a [place] headholder and  positioned him on the operative table prone with the neck flexed in the  military position.  His posterior neck was then prepped and draped in usual  sterile fashion.  However, his old incision did not look very good, at the  top there was some devitalized skin that we skin removed.  We did send a  culture from this wound.  We reopened the old posterior cervical exposure.   There was some seroma in the wound, which was irrigated out.  There was  some remaining lamina laterally, this was removed to provide extra  decompression in case he did slip further.  After these maneuvers and  distraction with pins, we were able to  reduce the facet dislocation for a  more anatomical alignment.  The posterior cervical hardware was completely  loose and had pulled out of the bone as expected.  This was removed.  This  made it technically challenging to replace screws at C6 and C7 as the screw  trajectories had fractured out.  We decided to go up to C5 and down to T1,  we placed lateral mass screws in C5 bilaterally and C6 bilaterally using a  more anterior projection, and on the right we used a modified Roy-Camille approach,  more direct approach, through the lamina.  At T1, we did get two nice  pedicle screws with good purchase, which is fortuitous since we could not  get  a screw in C7 on the left as the facet was fractured and there was no  way to place a screw in this fractured bone.  We connected these screws  from Globus to a titanium rod and secured it.  We compressed the fractured  facet so that it would be less likely to sublux again.  We then placed  autologous bone graft mixed with some DBX bone over the fracture from C5 to  T1 bilaterally.  The spinal canal appeared well decompressed at this point.   A medium Hemovac drain was left in the epidural space and tunneled  subcutaneously.  The incision was closed in layers.  Staples were used on  the skin.  A dressing was applied.  The patient was placed in a cervical  collar then returned to the supine position.  His anterior neck was again  prepped and draped in usual sterile fashion and the incision was reopened.   Now that there was reduction of the subluxation, we could place a bone  graft.  We performed a partial corpectomy of C7, which allowed for further  decompression and allowed for [place] to place a nice piece of iliac crest  allograft.  We shaped the graft to fit the interspace some 15 mm.  There  was good purchase of the bone plug.  Next, the construct was augmented with  an anterior cervical plate.  We placed a plate from C5 to C7 with 4 screws,  two in C5, two in C6 and another two screws in C7.  There was good purchase  of all 4 screws.  A good construct position was confirmed with lateral  fluoroscopy.  Copious antibiotic irrigation was used.  The incision was  closed in layers.  Steri-Strips were used on the skin.  Another dressing  was applied.  The patient was then allowed to emerge from anesthesia.  At  the time of this dictation, he is still intubated.  All counts correct.   Estimated blood loss for all 3 surgeries was less than 250 mL.  He did not  receive any blood or blood products in transfusion.  No complication.     DRAIN:  A medium Hemovac in the posterior wound.       SPECIMEN:  Culture of the posterior  wound.           D:  08/29/2015 04:56 AM by Dr. Okey Regal. Claudette Laws, MD ([id number])  T:  08/29/2015 16:03 PM by       Everlean Cherry: [id number]) (Doc ID: [id number])

## 2015-08-29 NOTE — Progress Notes (Signed)
Patient extubated per physicians order. Patient is very comfortable on nasal canula oxygen.

## 2015-08-29 NOTE — Plan of Care (Signed)
Problem: Safety  Goal: Patient will be free from injury during hospitalization  Outcome: Progressing    Problem: Pain  Goal: Patient's pain/discomfort is manageable  Outcome: Progressing    Problem: Moderate/High Fall Risk Score >5  Goal: Patient will remain free of falls  Outcome: Progressing

## 2015-08-29 NOTE — Anesthesia Postprocedure Evaluation (Signed)
Anesthesia Post Evaluation    Patient: Ernest Diaz    Procedure(s) with comments:  RE-DO ANTERIOR CERVICAL FUSION C6-7 - RE-DO ANTERIOR CERVICAL FUSION C6-7  REMOVAL OF ANTERIOR HARDWARE, C6-C7; POSTERIOR CERVICAL DECOMPRESSION, REDUCTION OF FRACTURE, FUSION C5-T1 WITH INSTRUMENTATION; ANTERIOR CERVICAL CORPECTOMY AND FUSION C5-C7 WITH INSTRUMENTATION.    Anesthesia type: general    Last Vitals:   Filed Vitals:    08/28/15 2240   BP: 108/57   Pulse: 84   Temp: 37.2 C (99 F)   Resp: 20   SpO2: 89%       Patient Location: ICU      Post Pain: Patient not complaining of pain, continue current therapy    Mental Status: unresponsive    Respiratory Function: intubated    Cardiovascular: stable    Nausea/Vomiting: patient not complaining of nausea or vomiting    Hydration Status: adequate    Post Assessment: no apparent anesthetic complications          Anesthesia Qualified Clinical Data Registry    Central Line      CVC insertion : NO                                               Perioperative temperature management      General/neuraxial anesthesia > or = 60 minutes (excluding CABG) : YES              > Use of intraoperative active warming : YES              > Temperature > or = 36 degrees Centigrade (96.8 degrees Farenheit) during time span from 30 minutes before up to 15 minutes after anesthesia end time : YES      Administration of antibiotic prophylaxis      Age > or = 18, with IV access, with surgical procedure for which antibiotic prophylaxis indicated, and not on chronic antibiotics : NO                  Medication Administration      Ordering or administration of drug inconsistent with intended drug, dose, delivery or timing : NO      Dental loss/damage      Dental injury with administration of anesthesia : NO      Difficult intubation due to unrecognized difficult airway        Elective airway procedure including but not limited to: tracheostomy, fiberoptic bronchoscopy, rigid bronchoscopy;  jet ventilation; or elective use of a device to facilitate airway management such as a Glidescope : NO                > Unanticipated difficult intubation post pre-evaluation : NO      Aspiration of gastric contents        Aspiration of gastric contents : NO                    Surgical fire        Procedure requiring electrocautery/laser : NO                    Immediate perioperative cardiac arrest        Cardiac arrest in OR or PACU : NO  Unplanned hospital admission        Unplanned hospital admission for initially intended outpatient anesthesia service : NO      Unplanned ICU admission        Unplanned ICU admission related to anesthesia occurring within 24 hours of induction or start of MAC : NO      Surgical case cancellation        Cancellation of procedure after care already started by anesthesia care team : NO      Post-anesthesia transfer of care checklist/protocol to PACU        Transfer from OR to PACU upon case conclusion : NO                    Post-anesthesia transfer of care checklist/protocol to ICU        Transfer from OR to ICU upon case conclusion : YES                > Use of ICU transfer checklist/protocol : YES     (Includes the key elements of: patient identification, responsible practitioner identification (ICU nurse or advanced practitioner), discussion of pertinent history and procedure course, intraoperative anesthetic management, post-procedure plans, acknowledgement/questions)     Post-operative nausea/vomiting risk protocol        Post-operative nausea/vomiting risk protocol : NO  Patient > or = 18 with care initiated by anesthesia team that has a risk factor screen for post-op nausea/vomiting (Includes male, hx PONV, or motion sickness, non-smoker, intended opioid administration for post-op analgesia.)    Anaphylaxis        Anaphylaxis during anesthesia services : NO    (Inclusive of any suspected transfusion reaction in association with blood-bank confirmed blood  product incompatibility)              Glenna Fellows, 08/29/2015 5:03 AM

## 2015-08-29 NOTE — Progress Notes (Addendum)
NEUROSURGERY PROGRESS NOTE    Date Time: 08/29/2015 2:43 PM  Patient Name: Ernest Diaz  Consulting Attending Physician: Michaelle Birks, MD  Covered By: Carmie End MD    Interim History:   Post-op check    Medications:     Current Facility-Administered Medications   Medication Dose Route Frequency   . amitriptyline  10 mg Oral QHS   . balsam peru-castor oil (VENELEX)   Topical Q12H SCH   . famotidine  20 mg Oral Q12H SCH    Or   . famotidine  20 mg Intravenous Q12H SCH   . finasteride  5 mg Oral QHS   . piperacillin-tazobactam  4.5 g Intravenous 4 times per day   . polyethylene glycol  17 g Oral Daily   . scopolamine  1 patch Transdermal Q72H   . senna-docusate  1 tablet Oral BID   . vancomycin  1,000 mg Intravenous Q12H [facility]         Physical Exam:     Filed Vitals:    08/29/15 1400   BP: 123/57   Pulse: 70   Temp:    Resp: 20   SpO2: 100%       Intake and Output Summary (Last 24 hours) at Date Time    Intake/Output Summary (Last 24 hours) at 08/29/15 1443  Last data filed at 08/29/15 0700   Gross per 24 hour   Intake 3324.48 ml   Output   1005 ml   Net 2319.48 ml       Drains:in place  Incision: dressings intact    Neuro exam:  Mental status:   Alert, awake, oriented x 2               Cranial Nerve:  Pupil/Reactive: on left  AYT:KZSWFU on left                                         Motor: no LE movement yet    Sensory: claims to have sensation in legs but not reliable        Labs:   Na+ 138    Rads:   Radiological Procedure reviewed.    Assessment:   C6-7 dislocation   Tolerated stabilization   Will need to keep C-collar on   Hope for return of function    Rec:   MRI when he can tolerate lying flat  PT/OT  Pulmonary toilet  Follow up OR cultures  Leave drain in for now  CT neck please  Consider IVC filter    Signed by: Brunetta Genera  Date/Time: 08/29/2015 2:43 PM  Cerebrum MD  [phone]

## 2015-08-29 NOTE — Progress Notes (Signed)
[facility]- Neuro Critical Care Service [facility])                                                      Progress note       Date Time: 08/29/2015 11:23 AM  Patient Name: Ernest Diaz  Attending Physician: Les Pou, MD  Room: [phone]  Admit Date: 08/17/2015  LOS: 12 days      No chief complaint on file.    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 He was brought to [facility] and underwent angiography 3/6 followed by ACDF C6-7 and posterior laminectomy of the C6-7 on 3/9. The patient has developed weakness over the last few days in the lower and upper extremities and is being evaluated for urgent surgical decompression and correction of anterolisthesis of C6 upon C7 noted on the CT scan done urgently today.     Overnight-extubated at 6 am today.    ASSESSMENT               PLAN  Admit to ICU for maintenance of Spinal perfusion.   Urgent surgical evaluation and management underway by Dr Claudette Laws at this time.   Urinary retention  UTI with E fecalis 3/13\  Dysphagia  Atrial fibrillation  Hypertension      Baseline MRS NEURO: s/p decompression and correction of anterolisthesis at c6-7      -Goals:   Perfusion goals: MAP>80  Na+ goal: 135-145  Pain/Sedation: as needed.   Drains: JP drain 20cc  PT/OT/Speech/swallow      CV: Hold antihypertensives while spine perfusion is at risk.   ?Reverse anticoagulation.   Cannot resume eliquis at this time    PULM: at risk for aspiration pneumonia.   IS    GI:    GI PPX  on Famotidine.   Nutrition: CLEARED SWALLOW FOR NECTAR THICK LIQUIDS    RENAL: Follow electrolytes and renal function. D/C IVF  I/O Goal: autoregulate.     ID: continue Vancomycin and Zosyn per ID service for presumed aspiration pneumonia.  UC grew enterococcus.f/u  sensitivities to make sure not VRE   WILL D/W ID ABOUT D/C ZOSYN    HEME: H/H is stable     SCD  Pharmacologic ppx: hold off for now.  Will d/w ns if we should hold as long as he has a JP drain.    ENDO/RHEUM:   Glucose: <180n with SSI if needed.     Lines/Foley: PIV, Foley for retention.   Daily labs: ordered.     Family Meeting:   Patient is currently able to make medical decisions.    Discussed current diagnoses, prognosis and goals of care.   Code status was also discussed and is Full at this time.   Code status Full Code    DISPO:lIKELY TRANSFER TO SPINE FLOOR.     Physical Exam:     Filed Vitals:    08/29/15 0700   BP: 114/59   Pulse: 66   Temp:    Resp: 12   SpO2: 100%        Intake/Output Summary (Last 24 hours) at 08/29/15 1123  Last  data filed at 08/29/15 0700   Gross per 24 hour   Intake 3324.48 ml   Output   1805 ml   Net 1519.48 ml     Vent Settings:  Vent Settings  Vent Mode: PRVC  FiO2: 40 %  Resp Rate (Set): 12  Vt (Set, mL): 600 mL  PIP Observed (cm H2O): 15 cm H2O  PEEP/EPAP: 5 cm H20  Mean Airway Pressure: 7 cmH20    Line, Drains, Airway:    General Appearance:    Mental status:  Sedation: None                Opens eyes (spont/voice/pain/none): spontaneous      Orientation: Oriented to person, place, situation, not to time.   Language: Fluent in english          Cranial Nerves  Pupils  OS: size/reactivity: 3 r  mm            OD: size/reactivity: blind since age 68                EOM(Diaz/IV/VI):  Intact with left eye.                                Visual field: intact with left eye      Corneal(V/VII): intact in left eye        Face(VII): symmetric  Shoulder shrug/head turn(XI): 4/5         Cough/gag(IX/X): present                     Uvula (IX/X): not checked.                  Tongue(XII): midline        MOTOR  Upper Extremity   Drift   LEFT(of 5) 3/5 FC         RIGHT(of 5) 3/5 FC        Lower Extremity    LEFT(of 5) 0   RIGHT(of 5) 0   Pronator drift: unable to assess.     Sensory: absent  sensation below T7  Cerebellar: no nystagmus or tremor.   Reflexes/Tone/Babinki: not checked.   HEENT: NC, AT, no icterus  CV: Irregular atrial fibrillation.   PULM: diminished at bases.   GI: soft with bowel sounds.   Ext: Has edema but no cyanosis.     Labs:   Hematology:  Recent Labs      08/29/15   0624  08/28/15   0433  08/27/15   0333   WBC  8.16  7.97  7.99   HGB  10.1*  11.6*  11.5*   HEMATOCRIT  31.5*  35.4*  34.5*   PLATELETS  246  271  230     No results for input(s): PT, INR, PTT in the last 72 hours.  Chemistry:  Recent Labs      08/29/15   0624  08/28/15   0433  08/27/15   0333   SODIUM  138  134*  132*   POTASSIUM  4.0  4.5  3.9   BUN  9.0  7.0*  7.0*   CREATININE  0.5*  0.5*  0.5*   GLUCOSE  133*  96  101*     Rads:   Radiological Imaging personally reviewed  Ct Cervical Spine Wo Contrast    08/28/2015  ADDENDUM:  On rereview of imaging, there are bilateral  jumped facets.  Neldon Mc, MD 08/28/2015 10:41 PM     08/28/2015  1.  Markedly abnormal examination with anterolisthesis of C6 upon C7 measuring 16 mm with a jumped facet on the left and a comminuted displaced fracture involving the right C7 articular facet. 2.  Anterior cervical spinal fusion hardware is malpositioned with both plate and screws extending into the C6-C7 disc space. 3.  The AP dimension of the bony spinal canal at the superior C5 level is reduced to 5 mm. These critical results were discussed with Payton Spark, PA at 9:36 PM on 08/28/2015. Neldon Mc, MD 08/28/2015 9:36 PM     Fluoroscopy Less Than 1 Hour    08/29/2015   Fluoroscopy provided. Prince Solian, MD 08/29/2015 8:10 AM     Xr Abdomen Portable    08/28/2015   Tip of the enteric tube in the body of the stomach. Colonel Bald, MD 08/28/2015 5:38 PM     Xr Abdomen Portable    08/28/2015   Nasogastric tube tip at or just below the gastroesophageal junction. Remer Macho, MD 08/28/2015 2:45 PM     I have personally reviewed the patient's history and 24 hour interval  events, along with vitals, labs, radiology images. So far today I have spent 50 minutes providing care for this patient excluding teaching and billable procedures, and not overlapping with any other providers    Signed by: Maudry Mayhew, MD  Date/Time: 08/29/2015 11:23 AM  Neuro Critical Care.   Attending MD [phone] (24hr) or [phone]  Midlevel Provider [phone] or [phone]

## 2015-08-29 NOTE — Transfer of Care (Addendum)
Anesthesia Transfer of Care Note    Patient: Ernest Diaz    Procedures performed: Procedure(s) with comments:  RE-DO ANTERIOR CERVICAL FUSION C6-7 - RE-DO ANTERIOR CERVICAL FUSION C6-7  REMOVAL OF ANTERIOR HARDWARE, C6-C7; POSTERIOR CERVICAL DECOMPRESSION, REDUCTION OF FRACTURE, FUSION C5-T1 WITH INSTRUMENTATION; ANTERIOR CERVICAL CORPECTOMY AND FUSION C5-C7 WITH INSTRUMENTATION.    Anesthesia type: General ETT    Patient location:NSICU    Last vitals:   Filed Vitals:    08/29/15 0532   BP:    Pulse:    Temp:    Resp:    SpO2: 100%       Post pain: Patient not complaining of pain, continue current therapy      Mental Status:sedated    Respiratory Function: intubated    Cardiovascular: stable    Nausea/Vomiting: patient not complaining of nausea or vomiting    Hydration Status: adequate    Post assessment: no apparent anesthetic complications, patient transported to NSICU, intubated, full monitor, vital signs stable, placed on long term ventilator  Upon arrival to the unit by respiratory Tx, see RT note for settings, report given and acknowledged by NSICU RN    Signed by: Aida Puffer  08/29/2015 5:43 AM      Vital signs at 0507 bp 145/63, atrial fib 63 sats 100

## 2015-08-29 NOTE — Progress Notes (Signed)
PROGRESS NOTE    Date Time: 08/29/2015 10:39 PM  Patient Name: [name]  S:   CC: Unable to move BLE, weak handgrips    O:  Alert, no apparent distress  Rigid collar in place  Motor: UE 5-/5 deltoids, 5/5 biceps, 4/5 triceps, 2/5 handgrips   No movement of BLE  Decreased sensation BLE    Incisions:    Anterior and posterior cervical dressings clean, dry, and intact.  Posterior hemovac with bloody drainage, drain  compressed.    A/P  S/p C3-C5 and T3-T4 decompressive laminectomies for EDH evacuation on 08/10/15 by Dr. Jaynie Collins at [facility]  C6-7 ACDF, C6-C7 posterior fusion with lateral mass scews on 08/20/15 by Dr. Jaynie Collins  C5-T1 posterior fusion, C5-C7 anterior fusion with partial C7 corpectomy on 08/29/15 by Dr. Claudette Laws       POD 0    Tolerating PO  Pain well controlled    Family at bedside updated, questions answered  Plan discussed with RN    Discussed patient with Dr. Jaynie Collins      Signed by: Ernest Diaz

## 2015-08-29 NOTE — Progress Notes (Signed)
08/29/15 2243   Respiratory Parameters - Non Ventilated   Status Completed   Resp Rate 20   Vital capacity 1000 mL   $ Vital Capacity Performed? Yes   Negative inspiratory force -34 cm H2o   $ NIF DONE Yes   Adverse Reactions None   NIF and VC completed. Pt performed 3 good attempts.

## 2015-08-29 NOTE — Progress Notes (Signed)
S:  Extubated this morning; per nurse, cannot move legs or wiggle toes at this point; pt denies sob     Abx:  Vancomycin #4  Zosyn #6    CENTRAL LINES:  NONE  PIV--no erythema noted    O: 97 HR 60s  RR 20 BP 114/59 99% 6L  +1.5L  NAD A&OX2  Mood pleasant  Neck collar in place; neck drains in place  cta bil ant  rrr no m/g  Soft, nt  Cannot move legs or wiggles toes  Moving arms    Labs:  Wbc 8.26  Hb 10.1 plts 246  Cr 0.5  Vancomycin trough 3/16:  7.3    Micro:  3/17:  Cervical wound cx pending  3/13 Ucx >100,000 E. 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  Found to have possible fevers, possibly UTI vs aspiration.  Yesterday, noted to have leg paresis.  Repeat CT scan cervical spine showed anterior hardware displacement.  Went for emergent OR last night for redo anterior spinal fusion.  1. Fevers:  UTI vs aspiration; Cont. With empiric abx for now.  Vancomycin trough adequate for now.

## 2015-08-29 NOTE — Addendum Note (Signed)
Addendum  created 08/29/15 0556 by Aida Puffer, CRNA    Modules edited: Anesthesia Flowsheet, Anesthesia Medication Administration

## 2015-08-29 NOTE — Progress Notes (Signed)
Good patient effort.       08/29/15 1755   Respiratory Parameters - Non Ventilated   Status Completed   Resp Rate 12   Vital capacity 1000 mL   $ Vital Capacity Performed? Yes   Negative inspiratory force -32 cm H2o   $ NIF DONE Yes   Equipment labeled No   Adverse Reactions None   Performing Departments   PLAB VC performing department formula [id number]   Resp param - No vent performing department formula [id number]

## 2015-08-29 NOTE — Anesthesia Postprocedure Evaluation (Signed)
Anesthesia Post Evaluation    Patient: Ernest Diaz    Procedure(s) with comments:  RE-DO ANTERIOR CERVICAL FUSION C6-7 - RE-DO ANTERIOR CERVICAL FUSION C6-7  REMOVAL OF ANTERIOR HARDWARE, C6-C7; POSTERIOR CERVICAL DECOMPRESSION, REDUCTION OF FRACTURE, FUSION C5-T1 WITH INSTRUMENTATION; ANTERIOR CERVICAL CORPECTOMY AND FUSION C5-C7 WITH INSTRUMENTATION.    Anesthesia type: general    Last Vitals:   Filed Vitals:    08/28/15 2240   BP: 108/57   Pulse: 84   Temp: 37.2 C (99 F)   Resp: 20   SpO2: 89%       Patient Location: ICU      Post Pain: Patient not complaining of pain, continue current therapy    Mental Status: unresponsive    Respiratory Function: intubated    Cardiovascular: stable    Nausea/Vomiting: patient not complaining of nausea or vomiting    Hydration Status: adequate    Post Assessment: no apparent anesthetic complications          Anesthesia Qualified Clinical Data Registry    Central Line      CVC insertion : NO                                               Perioperative temperature management      General/neuraxial anesthesia > or = 60 minutes (excluding CABG) : YES              > Use of intraoperative active warming : YES              > Temperature > or = 36 degrees Centigrade (96.8 degrees Farenheit) during time span from 30 minutes before up to 15 minutes after anesthesia end time : YES      Administration of antibiotic prophylaxis      Age > or = 18, with IV access, with surgical procedure for which antibiotic prophylaxis indicated, and not on chronic antibiotics : NO                  Medication Administration      Ordering or administration of drug inconsistent with intended drug, dose, delivery or timing : NO      Dental loss/damage      Dental injury with administration of anesthesia : NO      Difficult intubation due to unrecognized difficult airway        Elective airway procedure including but not limited to: tracheostomy, fiberoptic bronchoscopy, rigid bronchoscopy;  jet ventilation; or elective use of a device to facilitate airway management such as a Glidescope : NO                > Unanticipated difficult intubation post pre-evaluation : NO      Aspiration of gastric contents        Aspiration of gastric contents : NO                    Surgical fire        Procedure requiring electrocautery/laser : NO                    Immediate perioperative cardiac arrest        Cardiac arrest in OR or PACU : NO  Unplanned hospital admission        Unplanned hospital admission for initially intended outpatient anesthesia service : NO      Unplanned ICU admission        Unplanned ICU admission related to anesthesia occurring within 24 hours of induction or start of MAC : NO      Surgical case cancellation        Cancellation of procedure after care already started by anesthesia care team : NO      Post-anesthesia transfer of care checklist/protocol to PACU        Transfer from OR to PACU upon case conclusion : NO                    Post-anesthesia transfer of care checklist/protocol to ICU        Transfer from OR to ICU upon case conclusion : YES                > Use of ICU transfer checklist/protocol : YES     (Includes the key elements of: patient identification, responsible practitioner identification (ICU nurse or advanced practitioner), discussion of pertinent history and procedure course, intraoperative anesthetic management, post-procedure plans, acknowledgement/questions)     Post-operative nausea/vomiting risk protocol        Post-operative nausea/vomiting risk protocol : NO  Patient > or = 18 with care initiated by anesthesia team that has a risk factor screen for post-op nausea/vomiting (Includes male, hx PONV, or motion sickness, non-smoker, intended opioid administration for post-op analgesia.)    Anaphylaxis        Anaphylaxis during anesthesia services : NO    (Inclusive of any suspected transfusion reaction in association with blood-bank confirmed blood  product incompatibility)              Glenna Fellows, 08/29/2015 5:27 AM

## 2015-08-30 ENCOUNTER — Inpatient Hospital Stay: Payer: Medicare Other

## 2015-08-30 DIAGNOSIS — I959 Hypotension, unspecified: Secondary | ICD-10-CM

## 2015-08-30 DIAGNOSIS — I9589 Other hypotension: Secondary | ICD-10-CM

## 2015-08-30 DIAGNOSIS — R410 Disorientation, unspecified: Secondary | ICD-10-CM

## 2015-08-30 DIAGNOSIS — M4312 Spondylolisthesis, cervical region: Secondary | ICD-10-CM

## 2015-08-30 DIAGNOSIS — G909 Disorder of the autonomic nervous system, unspecified: Secondary | ICD-10-CM

## 2015-08-30 DIAGNOSIS — A499 Bacterial infection, unspecified: Secondary | ICD-10-CM

## 2015-08-30 DIAGNOSIS — R8279 Other abnormal findings on microbiological examination of urine: Secondary | ICD-10-CM

## 2015-08-30 LAB — CBC AND DIFFERENTIAL
Basophils Absolute Automated: 0.01 10*3/uL (ref 0.00–0.20)
Basophils Automated: 0 %
Eosinophils Absolute Automated: 0.04 10*3/uL (ref 0.00–0.70)
Eosinophils Automated: 0 %
Hematocrit: 30.7 % — ABNORMAL LOW (ref 42.0–52.0)
Hgb: 9.9 g/dL — ABNORMAL LOW (ref 13.0–17.0)
Immature Granulocytes Absolute: 0.04 10*3/uL
Immature Granulocytes: 0 %
Lymphocytes Absolute Automated: 0.98 10*3/uL (ref 0.50–4.40)
Lymphocytes Automated: 8 %
MCH: 31.9 pg (ref 28.0–32.0)
MCHC: 32.2 g/dL (ref 32.0–36.0)
MCV: 99 fL (ref 80.0–100.0)
MPV: 9.8 fL (ref 9.4–12.3)
Monocytes Absolute Automated: 0.92 10*3/uL (ref 0.00–1.20)
Monocytes: 8 %
Neutrophils Absolute: 9.94 10*3/uL — ABNORMAL HIGH (ref 1.80–8.10)
Neutrophils: 83 %
Nucleated RBC: 0 /100 WBC (ref 0–1)
Platelets: 273 10*3/uL (ref 140–400)
RBC: 3.1 10*6/uL — ABNORMAL LOW (ref 4.70–6.00)
RDW: 14 % (ref 12–15)
WBC: 11.93 10*3/uL — ABNORMAL HIGH (ref 3.50–10.80)

## 2015-08-30 LAB — BASIC METABOLIC PANEL
BUN: 9 mg/dL (ref 9.0–28.0)
CO2: 27 mEq/L (ref 22–29)
Calcium: 8 mg/dL (ref 7.9–10.2)
Chloride: 105 mEq/L (ref 100–111)
Creatinine: 0.5 mg/dL — ABNORMAL LOW (ref 0.7–1.3)
Glucose: 104 mg/dL — ABNORMAL HIGH (ref 70–100)
Potassium: 3.8 mEq/L (ref 3.5–5.1)
Sodium: 141 mEq/L (ref 136–145)

## 2015-08-30 LAB — VANCOMYCIN, TROUGH: Vancomycin Trough: 7.6 ug/mL — ABNORMAL LOW (ref 10.0–20.0)

## 2015-08-30 LAB — GFR: EGFR: >60

## 2015-08-30 MED ORDER — PHENYLEPHRINE INFUSION (SIMPLE)
Status: AC
Start: 2015-08-30 — End: 2015-08-30
  Filled 2015-08-30: qty 250

## 2015-08-30 MED ORDER — ENOXAPARIN SODIUM 40 MG/0.4ML SC SOLN
40.0000 mg | Freq: Every day | SUBCUTANEOUS | Status: DC
Start: 2015-08-30 — End: 2015-09-03
  Administered 2015-08-30 – 2015-09-03 (×5): 40 mg via SUBCUTANEOUS
  Filled 2015-08-30 (×5): qty 0.4

## 2015-08-30 MED ORDER — PHENYLEPHRINE INFUSION (SIMPLE)
50.0000 ug/min | Status: DC
Start: 2015-08-30 — End: 2015-08-31
  Administered 2015-08-30: 50 ug/min via INTRAVENOUS

## 2015-08-30 MED ORDER — PLASMA-LYTE A IV SOLN
1000.0000 mL | Freq: Once | INTRAVENOUS | Status: AC
Start: 2015-08-30 — End: 2015-08-30
  Administered 2015-08-30: 1000 mL via INTRAVENOUS

## 2015-08-30 MED ORDER — MIDODRINE HCL 5 MG PO TABS
10.0000 mg | ORAL_TABLET | Freq: Once | ORAL | Status: AC
Start: 2015-08-30 — End: 2015-08-30
  Administered 2015-08-30: 10 mg via ORAL
  Filled 2015-08-30: qty 2

## 2015-08-30 MED ORDER — QUETIAPINE FUMARATE 25 MG PO TABS
25.0000 mg | ORAL_TABLET | Freq: Every evening | ORAL | Status: DC
Start: 2015-08-30 — End: 2015-08-30

## 2015-08-30 MED ORDER — QUETIAPINE FUMARATE 25 MG PO TABS
25.0000 mg | ORAL_TABLET | Freq: Every evening | ORAL | Status: DC
Start: 2015-08-30 — End: 2015-09-06
  Administered 2015-08-30 – 2015-09-05 (×3): 25 mg via ORAL
  Filled 2015-08-30 (×5): qty 1

## 2015-08-30 NOTE — Progress Notes (Signed)
[facility]- Neuro Critical Care Service [facility])                                                      Progress note       Date Time: 08/30/2015 9:20 AM  Patient Name: Ernest Diaz  Attending Physician: Maudry Mayhew, MD  Room: [phone]  Admit Date: 08/17/2015  LOS: 13 days      No chief complaint on file.    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 He was brought to [facility] and underwent angiography 3/6 followed by ACDF C6-7 and posterior laminectomy of the C6-7 on 3/9. The patient has developed weakness over the last few days in the lower and upper extremities and is being evaluated for urgent surgical decompression and correction of anterolisthesis of C6 upon C7 noted on the CT scan done urgently today.     3/19: Hypotensive overnight and more confused, not responding to IVF. Central line placed and phenylephrine started. Afebrile. New leukocytosis, CXR negative. Vanco trough due 0930.  D/w Dr Claudette Laws.will lower map goals to >65 only.will start sq lovenox and [place] le.    ASSESSMENT               PLAN  Admit to ICU for maintenance of Spinal perfusion.   Urgent surgical evaluation and management underway by Dr Claudette Laws at this time.   Urinary retention  UTI with E fecalis 3/13\  Dysphagia  Atrial fibrillation  Hypertension      Baseline MRS NEURO: s/p decompression and correction of anterolisthesis at c6-7  Delirium-Will start low dose seroquel tonight and d/c the scopalamine patch  Need to make sure he is awake in day and sleeps at nigth as this is contributing to his delirium    -Goals:   Perfusion goals: MAP>65,wean off pressors  Na+ goal: 135-145  Pain/Sedation: as needed.   Drains: JP drain   PT/OT/Speech/swallow      CV: Hypotensive overnight, placed on pressors.    Will lower map goal to 65 and wean off of pressors.h/h mildly lowered.will hold off on prbc for now  Cannot resume eliquis at this time    PULM:IS    GI:    GI PPX  on Famotidine.   Nutrition: Nectar thick diet    RENAL: Follow electrolytes and renal function. D/C IVF  I/O Goal: autoregulate.     ID: continue Vancomycin and Zosyn per ID service for presumed aspiration pneumonia.  UC grew enterococcus. not VRE .  Will d/w ID about d/cing zosyn    HEME: H/H is mildly lower.will hold off on prbc unless pressor requirement goes up.    SCD  Pharmacologic ppx: start sq lovenox and check le dopplers as he has been immobolized for some time.  UE swelling on rt hand is from iv infiltration.will hold off on ue [place].    ENDO/RHEUM:   Glucose: <180n with SSI if needed.     Lines/Foley: PIV, Foley for retention.   Daily labs: ordered.     Family Meeting:   Patient is currently able to make medical decisions.    Discussed current diagnoses,  prognosis and goals of care.   Code status was also discussed and is Full at this time.   Code status Full Code    DISPO:If stable in next 24 hrs and iff pressors will deescalate to spine floor    Physical Exam:     Filed Vitals:    08/30/15 0853   BP:    Pulse:    Temp:    Resp: 0   SpO2:         Intake/Output Summary (Last 24 hours) at 08/30/15 0920  Last data filed at 08/30/15 0800   Gross per 24 hour   Intake 3104.64 ml   Output   1682 ml   Net 1422.64 ml     Vent Settings:  Vent Settings  Vent Mode: PRVC  FiO2: 40 %  Resp Rate (Set): 12  Vt (Set, mL): 600 mL  PIP Observed (cm H2O): 15 cm H2O  PEEP/EPAP: 5 cm H20  Mean Airway Pressure: 7 cmH20    Line, Drains, Airway:    General Appearance:    Mental status:  Sedation: None                Opens eyes (spont/voice/pain/none): spontaneous      Orientation: Oriented to person, place, situation, not to time.   Language: Fluent in english          Cranial Nerves  Pupils  OS: size/reactivity: 3 r  mm            OD: size/reactivity: blind since  age 67                EOM(Diaz/IV/VI):  Intact with left eye.                                Visual field: intact with left eye      Corneal(V/VII): intact in left eye        Face(VII): symmetric  Shoulder shrug/head turn(XI): 4/5         Cough/gag(IX/X): present                     Uvula (IX/X): not checked.                  Tongue(XII): midline        MOTOR  Upper Extremity   Drift   LEFT(of 5) 3/5 FC         RIGHT(of 5) 3/5 FC        Lower Extremity    LEFT(of 5) 0   RIGHT(of 5) 0   Pronator drift: unable to assess.     Sensory: absent sensation below T7  Cerebellar: no nystagmus or tremor.   Reflexes/Tone/Babinki: not checked.   HEENT: NC, AT, no icterus  CV: Irregular atrial fibrillation.   PULM: diminished at bases.   GI: soft with bowel sounds.   Ext: Has edema but no cyanosis.     Labs:   Hematology:  Recent Labs      08/30/15   0412  08/29/15   0624  08/28/15   0433   WBC  11.93*  8.16  7.97   HGB  9.9*  10.1*  11.6*   HEMATOCRIT  30.7*  31.5*  35.4*   PLATELETS  273  246  271     No results for input(s): PT, INR, PTT in the last 72 hours.  Chemistry:  Recent Labs  08/30/15   0412  08/29/15   0624  08/28/15   0433   SODIUM  141  138  134*   POTASSIUM  3.8  4.0  4.5   BUN  9.0  9.0  7.0*   CREATININE  0.5*  0.5*  0.5*   GLUCOSE  104*  133*  96     Rads:   Radiological Imaging personally reviewed  Xr Chest Ap Portable    08/30/2015  . Line tip SVC with no pneumothorax Marty Heck, MD 08/30/2015 3:12 AM     I have personally reviewed the patient's history and 24 hour interval events, along with vitals, labs, radiology images. So far today I have spent 50 minutes providing care for this patient excluding teaching and billable procedures, and not overlapping with any other providers    Signed by: Maudry Mayhew, MD  Date/Time: 08/30/2015 9:20 AM  Neuro Critical Care.   Attending MD [phone] (24hr) or [phone]  Midlevel Provider [phone] or [phone]

## 2015-08-30 NOTE — Plan of Care (Addendum)
Problem: Safety  Goal: Patient will be free from injury during hospitalization  Outcome: Progressing  Fall precautions in place, bed alarm on, floor mats at bedside, bed in lowest position.     Problem: Pain  Goal: Patient's pain/discomfort is manageable  Outcome: Progressing  Pt reports stiffness/discomfort to shoulders. Pt denied need for pain medication. Continuing to monitor.     Problem: Tissue integrity  Goal: Damaged tissue is healing and protected  Outcome: Progressing  Mepilex in place to sacrum/coccyx skin breakdown. Venelex applied. Repositioned Q2, Pillow support for limbs.     Problem: Urinary Incontinence  Goal: Perineal skin integrity is maintained or improved  Assess genitourinary system, perineal skin, labs (urinalysis), and history of incontinence to include past management, aggravating, and alleviating factors. Collaborate with interdisciplinary team and initiate plans and interventions as needed.   Outcome: Progressing  Foley remains in place, monitoring I/O.    Problem: Neurological Deficit  Goal: Neurological status is stable or improving  Outcome: Progressing  Q1 Neuro checks. AOx2, disoriented to time and situation. Face symmetrical. Prosthetic R eye. L eye pupil 3mm, round, brisk. BUE 3/5 strength. No fine motor to bilateral hands, able to move wrists, no grip. BLE TF to painful stimuli. Unable to wiggle toes. Unable to determine appropriate sensation.   Refer to complex and complete assessment for full exam.     Central line placed for MAP <80.   Florinef and Midodrine ordered per [facility].   [facility] gave verbal consent to put Neo gtt at 25 mcg 0238.

## 2015-08-30 NOTE — Progress Notes (Signed)
[facility] Update    Doppler right leg with nonocclusive thrombus proximal femoral vein. Vitals and exam stable. Patient no longer hypotensive, off pressors, no hypoxia. Unable to anticoagulate as POD 1 s/p spinal surgery. Have placed request for IVC filter placement.     Above discussed with Dr Jackelyn Poling.       Signed by: Kelly Splinter, MPAS, PA-C    Date/Time: 08/30/2015 3:25 PM  Neuro Critical Care.   Attending MD [phone] (24hr)  Midlevel Provider [phone] (7a-7p)

## 2015-08-30 NOTE — SLP Progress Note (Signed)
[facility]   SLP Treatment Note  Patient: Ernest Diaz    MRN#: [id number]     Treatment Type: dysphagia f/u    Recommendations/Plan:   - Diet Recommendations: NPO/Corpak    SLP Frequency Recommended: 5x per week    Discharge recommendations: Rehab consult, VFSS    Assessment:   Patient presenting with continued decline in swallow function. Suspect severe oropharyngeal dysphagia. Immediate weak cough with all trials. Pt with reduced oral control and poor head positioning given presence of c-spine collar. Swallow onset mildly delayed with diminished hyolaryngeal movement. Pt requiring 3-4 swallows per bolus. Wet vocal quality appreciated with ineffective cough response for clearance. Weak/dysphonic vocal quality. SLP recommends strict NPO with Corpak for nutrition. May benefit from future VFSS once swallow improves and pt able to participate in additional trials. SLP to follow closely.   Subjective:   Pain: 0/10  Current Diet: NPO  Respiratory Status: O2 via NC  Precautions:  Fall, aspiration, c-collar  Patient seen in bed. Reports increased difficulty swallowing. RN states earlier meal was discontinued. Aspen collar donned. Patient left with call bell within reach, all needs met, SCDs in place, fall mat in place, bed alarm activated and all questions answered.   Objective:   Objective:   Patient accepted thin liquids x2 via tsp and ice chips x2. Pt with reduced oral control and poor head positioning given presence of c-spine collar. Swallow onset mildly delayed with diminished hyolaryngeal movement. Pt requiring 3-4 swallows per bolus. Wet vocal quality appreciated with ineffective cough response for clearance. Further trials discontinued given severity of dysphagia and pt's ineffective and weak cough. Recommend strict NPO with Corpak for nutrition.     Patient Education: Verbal education provided to both pt and wife; pt expressed understanding and agreement    Goals:   Patient will  tolerate sips of liquid/ice chips x10 without aspiration. ONGOING  Patient will complete pharyngeal exercises independently outside of tx. Lorella Nimrod, MS, CCC-SLP    Time of Treatment:  SLP Received On: 08/30/15  Start Time: 1315  Stop Time: 1325  Time Calculation (min): 10 min

## 2015-08-30 NOTE — Progress Notes (Signed)
[facility] attending on call    The patient has become progressively hypotensive and confused.   No longer able to wiggle toes.   He has no other symptoms, suggests that he may be in spinal shock from autonomic dysregulation.   Have administered fludrocortisone.  Have started Proamatine and ordered iv fluid boluses.   Blood pressure continues to stay borderline with HR 76.   Will place a central line and start pressors for perfusion.   Critical care time spent direclty coordinating the care of the patient excluding overlap with other providers, procedures and teaching was 65 minutes.

## 2015-08-30 NOTE — Progress Notes (Signed)
S:  Overnight, more confused and labile BP--possibly from spinal shock; started on pressors    Abx:  Vancomycin #5  Zosyn #7    CENTRAL LINES:  Left SC TLC 3/19--IV pressors and IV abx    Drips:  Phenylephrine gtt    O: 98  HR 70s  RR 20  BP 145/65  99%6L I/O +3.1L  NAD A&OX2  Mood pleasant  Neck collar in place; neck drains in place  cta bil ant  rrr no m/g  Soft, nt  Cannot move legs or wiggles toes  Moving arms    Labs:  Wbc 11.93 (increased from 8.1)  Hb 9.9 plts 273  Cr 0.5  Vancomycin trough 3/16:  7.3    Micro:  3/17:  Cervical wound cx neg  3/13 Ucx >100,000 E. 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  Found to have possible fevers, possibly UTI vs aspiration.  Yesterday, noted to have leg paresis.  Repeat CT scan cervical spine showed anterior hardware displacement.  Went for emergent OR for redo anterior spinal fusion--POD #2.  Overnight, had low BP, possibly spinal shock/autonomic dysfunction.   1. Fevers:  UTI vs aspiration; Cont. With empiric abx for now.  Vancomycin trough adequate for now.  Possibly low blood pressure more likely related to autonomic dysfunction.

## 2015-08-30 NOTE — SLP Progress Note (Signed)
[facility]   Speech Therapy Cancellation Note      Patient:  Ernest Diaz MRN#:  [id number]  Unit:  [facility] TOWER 7 Room/Bed:  V409/W119.14    08/30/2015  Time: 0830    Patient not seen for speech therapy secondary to patient underwent neurosurgery yesterday and has been upgraded to ICU. SLP will need new orders before resuming dysphagia tx. SLP recommends NPO/corpak until patient can be re-evaluated.     Matt Shannin Naab MS, CCC-SLP

## 2015-08-30 NOTE — Progress Notes (Signed)
08/30/15 0853   Respiratory Parameters - Non Ventilated   Vital capacity (unable to perform)   Negative inspiratory force -28 cm H2o   $ NIF DONE Yes   Adverse Reactions None

## 2015-08-30 NOTE — Progress Notes (Signed)
Central line placed emergently by Dr. Velda Shell, due to Hypotension. Refer to [facility] note on Central line placement.   Pressors started to obtain NSICU goals.

## 2015-08-30 NOTE — Progress Notes (Signed)
This note also relates to the following rows which could not be included:  Resp Rate - Cannot attach notes to unvalidated device data         08/30/15 1655   Respiratory Parameters - Non Ventilated   Status Completed   Vital capacity 600 mL   $ Vital Capacity Performed? Yes   Negative inspiratory force -26 cm H2o   $ NIF DONE Yes   Adverse Reactions None

## 2015-08-30 NOTE — Progress Notes (Signed)
NEUROSURGERY PROGRESS NOTE    Date Time: 08/30/2015 9:00 AM  Patient Name: Ernest Diaz  Consulting Attending Physician: Michaelle Birks, MD  Covered By: Carmie End MD    Interim History:   POD 1  Hallucinating  Possibly some toe movement on the left per nursing    Medications:     Current Facility-Administered Medications   Medication Dose Route Frequency   . amitriptyline  10 mg Oral QHS   . balsam peru-castor oil (VENELEX)   Topical Q12H SCH   . famotidine  20 mg Oral Q12H SCH    Or   . famotidine  20 mg Intravenous Q12H SCH   . finasteride  5 mg Oral QHS   . fludrocortisone  0.2 mg Oral Daily   . midodrine  10 mg Oral BID Meals   . phenylephrine       . piperacillin-tazobactam  4.5 g Intravenous 4 times per day   . polyethylene glycol  17 g Oral Daily   . scopolamine  1 patch Transdermal Q72H   . senna-docusate  1 tablet Oral BID   . vancomycin  1,000 mg Intravenous Q12H [facility]         Physical Exam:     BP 137/63    Intake and Output Summary (Last 24 hours) at Date Time    Intake/Output Summary (Last 24 hours) at 08/30/15 0900  Last data filed at 08/30/15 0800   Gross per 24 hour   Intake 4854.64 ml   Output   1682 ml   Net 3172.64 ml       Drains:out  Incision: dressings dry    Neuro exam:  Mental status:   Alert, awake, oriented x 2  Frequent visual hallucinations               Cranial Nerve:  Pupil/Reactive: left eye  AOZ:HYQM eye                                         Motor: good triceps function left (4/5) >right (3/5); poor hand opening  No distal movement    Sensory: no position sense in feet    Reflexes:  Babinski: bilateral      Labs:   Hg 9.9      Rads:   Radiological Procedure reviewed.    Assessment:   C6-7 fracture dislocation   Now delirious; likely early sepsis   No meaningful recovery of distal function yet  Anemia and mild hypotension on pressors       Rec:   CT C-spine to evaluate alignment  MRI C-spine when stable enough  Sepsis, delirium per ID, ICU team  consider IVC  filter        Signed by: Brunetta Genera  Date/Time: 08/30/2015 9:00 AM  Cerebrum MD  [phone]

## 2015-08-30 NOTE — Procedures (Signed)
Ernest Diaz is a 70 y.o. male patient.  Principal Problem:    Thoracic spinal AV fistula  Active Problems:    Quadriplegia    Atrial fibrillation    Past Medical History   Diagnosis Date   . Hypertension    . Arthritis    . Pericarditis 1975   . Atrial fibrillation    . Arrhythmia      Blood pressure 89/49, pulse 66, temperature 98.5 F (36.9 C), temperature source Oral, resp. rate 17, height 1.803 m (5\' 11" ), weight 92.3 kg (203 lb 7.8 oz), SpO2 99 %.    Central Line  Date/Time: 08/30/2015 12:51 AM  Performed by: Eden Emms  Authorized by: Eden Emms  Consent: The procedure was performed in an emergent situation. Verbal consent obtained.  Risks and benefits: risks, benefits and alternatives were discussed  Consent given by: patient  Patient understanding: patient states understanding of the procedure being performed  Patient consent: the patient's understanding of the procedure matches consent given  Site marked: the operative site was marked  Required items: required blood products, implants, devices, and special equipment available  Patient identity confirmed: verbally with patient, arm band, provided demographic data and hospital-assigned identification number  Time out: Immediately prior to procedure a "time out" was called to verify the correct patient, procedure, equipment, support staff and site/side marked as required.  Indications: vascular access  Anesthesia: local infiltration  Local anesthetic: lidocaine 1% without epinephrine  Anesthetic total: 5 ml  Patient sedated: no  Preparation: skin prepped with ChloraPrep  Skin prep agent dried: skin prep agent completely dried prior to procedure  Sterile barriers: all five maximum sterile barriers used - cap, mask, sterile gown, sterile gloves, and large sterile sheet  Hand hygiene: hand hygiene performed prior to central venous catheter insertion  Location: left axillary vein in deltopectoral groove.  Patient position:  Trendelenburg  Catheter type: triple lumen  Catheter size: 7 Fr  Pre-procedure: landmarks identified  Ultrasound guidance: yes  Sterile ultrasound techniques: sterile gel and sterile probe covers were used  Number of attempts: 1  Successful placement: yes  Post-procedure: line sutured and dressing applied  Assessment: blood return through all ports and free fluid flow (Chest Xray pending. )        Eden Emms  08/30/2015

## 2015-08-31 ENCOUNTER — Other Ambulatory Visit: Payer: Medicare Other

## 2015-08-31 ENCOUNTER — Inpatient Hospital Stay: Payer: Medicare Other

## 2015-08-31 ENCOUNTER — Encounter
Admission: AD | Disposition: A | Discharge status: Other Acute Inpatient Hospital | Payer: Self-pay | Source: Other Acute Inpatient Hospital | Attending: Internal Medicine

## 2015-08-31 DIAGNOSIS — G9520 Unspecified cord compression: Secondary | ICD-10-CM

## 2015-08-31 DIAGNOSIS — R579 Shock, unspecified: Secondary | ICD-10-CM | POA: Insufficient documentation

## 2015-08-31 DIAGNOSIS — Z95828 Presence of other vascular implants and grafts: Secondary | ICD-10-CM

## 2015-08-31 HISTORY — DX: Presence of other vascular implants and grafts: Z95.828

## 2015-08-31 HISTORY — PX: IVC FILTER PLACEMENT: IMG2590

## 2015-08-31 LAB — BASIC METABOLIC PANEL
BUN: 9 mg/dL (ref 9.0–28.0)
CO2: 27 mEq/L (ref 22–29)
Calcium: 8.1 mg/dL (ref 7.9–10.2)
Chloride: 106 mEq/L (ref 100–111)
Creatinine: 0.5 mg/dL — ABNORMAL LOW (ref 0.7–1.3)
Glucose: 108 mg/dL — ABNORMAL HIGH (ref 70–100)
Potassium: 3.7 mEq/L (ref 3.5–5.1)
Sodium: 143 mEq/L (ref 136–145)

## 2015-08-31 LAB — CBC AND DIFFERENTIAL
Basophils Absolute Automated: 0.01 10*3/uL (ref 0.00–0.20)
Basophils Automated: 0 %
Eosinophils Absolute Automated: 0.03 10*3/uL (ref 0.00–0.70)
Eosinophils Automated: 0 %
Hematocrit: 31.9 % — ABNORMAL LOW (ref 42.0–52.0)
Hgb: 10 g/dL — ABNORMAL LOW (ref 13.0–17.0)
Immature Granulocytes Absolute: 0.04 10*3/uL
Immature Granulocytes: 0 %
Lymphocytes Absolute Automated: 0.63 10*3/uL (ref 0.50–4.40)
Lymphocytes Automated: 7 %
MCH: 31.7 pg (ref 28.0–32.0)
MCHC: 31.3 g/dL — ABNORMAL LOW (ref 32.0–36.0)
MCV: 101.3 fL — ABNORMAL HIGH (ref 80.0–100.0)
MPV: 9.7 fL (ref 9.4–12.3)
Monocytes Absolute Automated: 0.57 10*3/uL (ref 0.00–1.20)
Monocytes: 6 %
Neutrophils Absolute: 7.77 10*3/uL (ref 1.80–8.10)
Neutrophils: 86 %
Nucleated RBC: 0 /100 WBC (ref 0–1)
Platelets: 215 10*3/uL (ref 140–400)
RBC: 3.15 10*6/uL — ABNORMAL LOW (ref 4.70–6.00)
RDW: 14 % (ref 12–15)
WBC: 9.05 10*3/uL (ref 3.50–10.80)

## 2015-08-31 LAB — GFR: EGFR: >60

## 2015-08-31 SURGERY — IVC FILTER PLACEMENT
Anesthesia: Conscious Sedation

## 2015-08-31 MED ORDER — MIDODRINE HCL 5 MG PO TABS
10.0000 mg | ORAL_TABLET | Freq: Three times a day (TID) | ORAL | Status: DC
Start: 2015-08-31 — End: 2015-09-24
  Administered 2015-08-31 – 2015-09-24 (×60): 10 mg via ORAL
  Filled 2015-08-31 (×59): qty 2
  Filled 2015-08-31: qty 1
  Filled 2015-08-31 (×6): qty 2

## 2015-08-31 MED ORDER — HEPARIN (PORCINE) IN NACL 2-0.9 UNIT/ML-% IJ SOLN
INTRAMUSCULAR | Status: AC
Start: 2015-08-31 — End: ?
  Filled 2015-08-31: qty 1000

## 2015-08-31 MED ORDER — LIDOCAINE 1% BUFFERED - CNR/OUTSOURCED
INTRAMUSCULAR | Status: AC
Start: 2015-08-31 — End: ?
  Filled 2015-08-31: qty 22

## 2015-08-31 MED ORDER — LIDOCAINE 1% BUFFERED - CNR/OUTSOURCED
INTRAMUSCULAR | Status: AC | PRN
Start: 2015-08-31 — End: 2015-08-31
  Administered 2015-08-31: 5 mL via INTRADERMAL

## 2015-08-31 MED ORDER — BISACODYL 10 MG RE SUPP
10.0000 mg | Freq: Every day | RECTAL | Status: DC | PRN
Start: 2015-08-31 — End: 2015-09-24
  Administered 2015-09-19 – 2015-09-24 (×2): 10 mg via RECTAL
  Filled 2015-08-31 (×2): qty 1

## 2015-08-31 MED ORDER — SENNOSIDES-DOCUSATE SODIUM 8.6-50 MG PO TABS
2.0000 | ORAL_TABLET | Freq: Three times a day (TID) | ORAL | Status: DC
Start: 2015-08-31 — End: 2015-09-09
  Administered 2015-08-31 – 2015-09-08 (×7): 2 via ORAL
  Filled 2015-08-31: qty 2
  Filled 2015-08-31 (×5): qty 1
  Filled 2015-08-31 (×3): qty 2
  Filled 2015-08-31 (×2): qty 1

## 2015-08-31 MED ORDER — IOHEXOL 240 MG/ML IJ SOLN
15.0000 mL | Freq: Once | INTRAMUSCULAR | Status: AC | PRN
Start: 2015-08-31 — End: 2015-08-31
  Administered 2015-08-31: 15 mL via INTRAVENOUS

## 2015-08-31 NOTE — Brief Op Note (Signed)
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  Option Elite retrievable IVCF infrarenal    Complications:   none      Signed by: Leeroy Bock, MD                                                                              FX IVR

## 2015-08-31 NOTE — Progress Notes (Signed)
Fair patient effort       08/31/15 0940   Respiratory Parameters - Non Ventilated   Status Completed   Vital capacity 400 mL   $ Vital Capacity Performed? Yes   Negative inspiratory force -22 cm H2o   $ NIF DONE Yes   Equipment labeled No   Adverse Reactions None   Performing Departments   PLAB VC performing department formula [id number]   Resp param - No vent performing department formula [id number]

## 2015-08-31 NOTE — Progress Notes (Signed)
Patient remains alert and oriented to person, place and situation and confused at times of time. Please see critical cares sheet for complete and comprehensive assessment. Patient maintained on [employer]. On 3-4 l/m of oxygen via n/c with oxygen saturation of 97-99%. Foley catheter and central line discontinue after discussions with Neurosurgery team. Patient kept NPO since 11 AM for possible SCV filter placement. Turned and positioned q 2 hours. Patient has transfer to floor orders but awaiting bed. Patient returned from IR lab s/p scv filter placement. No bleeding at the right femoral insertion site noted. Patient continue to have sensation in lower extremities bilaterally. No change in Neuro assessment.   Patient is due to void at 2000. Next shift endorsed to follow foley removal protocol. Patient's son has Influenza B . Rapid influenza antigen swab sent to lab. Awaiting results.

## 2015-08-31 NOTE — Progress Notes (Signed)
Initially at start of nightshift, patient's Negative Insp Force was -18 cm H2O and his Vital Capacity was . The patient is in a neck brace unable to fully sit up and has an NG-Tube preventing perfect seal of the nares for the test. The patient has also had some difficulty coughing and has had a weak but spontaneous cough.  Shortly after this test, the patient started to desat on nasal cannula and required a Non-RB mask and NT suctioning. Patient had retained secretions but unable to cough them up himself.    After NT suction, patient was able to be placed back on nasal cannula and is able to oral suction himself. We started the patient on IPV q8h to help with secretion mobilization and better aeration of the lungs. First IPV round, completed and patient's NIF after this was -24 cm H2O and though patient was tired and didn't want to do Vital Capacity again at this time, he was able to achieve on Incentive Spirometer. Spoke with him about working with IS device on his own and while we are with him and his goal will be to achieve . Will continue to monitor.

## 2015-08-31 NOTE — SLP Progress Note (Signed)
[facility]   SLP Treatment Note  Patient: Ernest Diaz    MRN#: [id number]     Treatment Type: dysphagia f/u    Recommendations/Plan:   - Diet Recommendations: NPO/Corpak    SLP Frequency Recommended: 5x per week    Discharge recommendations: Rehab consult    Assessment:   Continue to suspect severe oropharyngeal dysphagia. Weak, ineffective cough and labored breathing with trials of water via tsp and ice chips. Pt with reduced oral control and poor head positioning (Aspen collar). Pt lethargic this date, benefiting from intermittent cues to remain alert. Swallow onset delayed with significantly diminished hyolaryngeal movement. Pt requiring 2-3 swallows per bolus. Wet vocal quality appreciated with ineffective cough response for clearance; oral suctioning and oral care provided. Weak/dysphonic vocal quality. SLP recommends strict NPO with Corpak for nutrition. May benefit from future VFSS once swallow improves and pt able to participate in additional trials. SLP to follow closely.   Subjective:   Pain: 0/10  Current Diet: NPO/Corpak  Respiratory Status: O2 via NC  Precautions:  Fall, aspiration, c-collar  Patient seen in bed. Pt describes continued difficulty swallowing. RN cleared pt for trials of water and ice chips only due to upcoming surgery. Of note, these are the only appropriate trials at this time, given severity of swallow impairment. Aspen collar donned. Family at bedside. Patient left with call bell within reach, all needs met, SCDs in place, fall mat in place, bed alarm activated and all questions answered.   Objective:   Objective:   Patient accepted ice chips x2 and water via 1/2 and 1/4 teaspoons. No overt s/sx aspiration for ice chips; immediate weak cough in 3/5 opportunities for thin liquid. Of note, pt coughing with larger 1/2 tsps of thin liquid vs. smaller trials. Pt with reduced oral control and poor head positioning given presence of c-spine collar. Swallow onset  delayed with diminished hyolaryngeal movement. Pt requiring 2-3 swallows per bolus. Wet vocal quality appreciated with ineffective cough response for clearance. Pt gasping for air when unable to effectively expel trials via cough. Required suctioning to alleviate wet vocal quality. Oral care provided. Further trials discontinued given severity of dysphagia and pt's ineffective and weak cough. Recommend strict NPO with Corpak for nutrition.     Patient Education: Verbal education provided to both pt and family; pt expressed understanding and agreement    Goals:   Patient will tolerate sips of liquid/ice chips x10 without aspiration. ONGOING  Patient will complete pharyngeal exercises independently outside of tx. Lorella Nimrod, MS, CCC-SLP    Time of Treatment:  SLP Received On: 08/31/15  Start Time: 1055  Stop Time: 1113  Time Calculation (min): 18 min

## 2015-08-31 NOTE — Progress Notes (Signed)
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

## 2015-08-31 NOTE — Progress Notes (Signed)
Worker met w/pt and family (son's girlfriend) at bedside on 08/31/15.  Per prior CM notes, pt's [place] plan continues to be placement@ [facility]. Son's GF was not able to confirm, Clinical research associate will contact son to verify, and will continue to follow.    Frederich Chick, LCSW  Case Management and Discharge Planning  [phone]

## 2015-08-31 NOTE — H&P (Signed)
BRIEF IR HISTORY AND PHYSICAL    Date Time: 08/31/2015 1:16 PM    HPI:   [name] is a 70 y.o. year old male with right femoral DVT one day s/p C6-C7 fusion who presents for temporary IVC filter placement.      INDICATIONS:   Procedure(s):  IVC FILTER PLACEMENT      PROCEDURALIST COMMENTS BELOW:   The procedure, risks, benefits, and alternative were discussed with the patient. They would like to proceed with the procedure as planned.        PAST MEDICAL HISTORY:     Past Medical History   Diagnosis Date   . Hypertension    . Arthritis    . Pericarditis 1975   . Atrial fibrillation    . Arrhythmia        PAST SURGICAL HISTORY     Past Surgical History   Procedure Laterality Date   . Vein surgery     . Colonoscopy       x2   . 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 Eye surgery  1960     Rt eye removed   . Decompression, anterior cervical, fusion, levels 2 N/A 08/20/2015     Procedure: DECOMPRESSION, ANTERIOR CERVICAL, FUSION, LEVELS 2;  Surgeon: Madaline Savage, MD;  Location: [facility];  Service: Neurosurgery;  Laterality: N/A;  C5-C7 ACDF; AVM REMOVAL   . Decompression, posterior cervical, fusion, levels 2 N/A 08/20/2015     Procedure: DECOMPRESSION, POSTERIOR CERVICAL, FUSION, LEVELS 2;  Surgeon: Madaline Savage, MD;  Location: [facility];  Service: Neurosurgery;  Laterality: N/A;       Family History:     Family History   Problem Relation Age of Onset   . Intracerebral hemorrhage Neg Hx    . Stroke Neg Hx        Social History:     Social History     Social History   . Marital Status: Married     Spouse Name: N/A   . Number of Children: N/A   . Years of Education: N/A     Social History Main Topics   . Smoking status: Former Smoker     Quit date: 03/07/1986   . Smokeless tobacco: Never Used   . Alcohol Use: 1.2 - 1.8 oz/week     2-3 Shots of liquor per  week      Comment: daily   . Drug Use: No   . Sexual Activity: Not on file     Other Topics Concern   . Not on file     Social History Narrative         REVIEW OF SYSTEMS REVIEWED:   YES  ( X )        HOME MEDICATIONS     Prior to Admission medications    Medication Sig Start Date End Date Taking? Authorizing Provider   amitriptyline (ELAVIL) 10 MG tablet Take 10 mg by mouth nightly. Started at [facility] 08/2015   Yes [provider]   amLODIPine (NORVASC) 10 MG tablet Take 10 mg by mouth daily.   Yes [provider]   Calcium Carb-Cholecalciferol (CALCIUM 600 + D PO) Take by mouth daily.   Yes [provider]   Cholecalciferol (VITAMIN D3) 1000 UNITS Cap Take by mouth every mon,  wed and fri.   Yes [provider]   Flaxseed, Linseed, (FLAX SEEDS PO) Take by mouth daily.   Yes [provider]   folic acid (FOLVITE) 400 MCG tablet Take 400 mcg by mouth daily.   Yes [provider]   Magnesium 300 MG Cap Take by mouth daily.   Yes [provider]   Multiple Vitamins-Minerals (CENTRUM SILVER ADULT 50+ PO) Take by mouth daily.   Yes [provider]   Omega-3 Fatty Acids (FISH OIL) 1200 MG Cap Take by mouth daily.   Yes [provider]   triamterene-hydrochlorothiazide (MAXZIDE-25) 37.5-25 MG per tablet Take 1 tablet by mouth daily.   Yes [provider]   acetaminophen (TYLENOL) 325 MG tablet Take 1 tablet (325 mg total) by mouth every 4 (four) hours as needed. 08/22/15   Isaias Cowman, MD   atenolol (TENORMIN) 50 MG tablet Take 0.5 tablets (25 mg total) by mouth daily. 08/22/15   Isaias Cowman, MD   finasteride (PROSCAR) 5 MG tablet Take 1 tablet (5 mg total) by mouth nightly. 08/22/15   Isaias Cowman, MD   methocarbamol (ROBAXIN) 750 MG tablet Take 1 tablet (750 mg total) by mouth 3 (three) times daily as needed. 08/22/15   Isaias Cowman, MD   oxyCODONE 10 MG Tab Take 1 tablet (10 mg total) by mouth every 4 (four)  hours as needed for Pain. 08/22/15   Isaias Cowman, MD   senna-docusate (PERICOLACE) 8.6-50 MG per tablet Take 1 tablet by mouth 2 (two) times daily. 08/22/15   Isaias Cowman, MD         INPATIENT MEDICATIONS      Current Facility-Administered Medications   Medication Dose Route Frequency   . balsam peru-castor oil (VENELEX)   Topical Q12H SCH   . enoxaparin  40 mg Subcutaneous Daily   . famotidine  20 mg Oral Q12H SCH    Or   . famotidine  20 mg Intravenous Q12H SCH   . finasteride  5 mg Oral QHS   . fludrocortisone  0.2 mg Oral Daily   . midodrine  10 mg Oral TID MEALS   . piperacillin-tazobactam  4.5 g Intravenous 4 times per day   . polyethylene glycol  17 g Oral Daily   . QUEtiapine  25 mg Oral QHS   . senna-docusate  2 tablet Oral TID        acetaminophen, albuterol, benzocaine-menthol, bisacodyl, bisacodyl, calcium carbonate, hydrALAZINE, HYDROmorphone, labetalol, methocarbamol, naloxone, ondansetron, oxyCODONE, senna-docusate      ALLERGIES:     Allergies   Allergen Reactions   . Sulfa Antibiotics Other (See Comments)     Lip swelling         PREVIOUS REACTION TO SEDATION MEDICATIONS     NO ( X )   YES (  )      PHYSICAL EXAM     AIRWAY CLASSIFICATION:    CLASS I   (  )   CLASS II  (  )    CLASS III  (X  )     CLASS IV  (  )    INTUBATED (  )    CARDIAC :   ( X )  RRR  (  )  IRREG  (  )  MURMUR      LUNGS:   ( X )  CLEAR  (  )  DIMINISHED    (  ) LEFT   (  )  RIGHT  (  )  ABSENT          (  ) LEFT   (  )  RIGHT  (  )  TUBES            (  ) LEFT   (  )  RIGHT          ABDOMEN:   Soft, ND, nttp, +BS      NEURO:   A&O X3, normal speech, CN II-X grossly intact, C-collar in place      LABS:     Lab Results   Component Value Date/Time    WBC 9.05 08/31/2015 03:12 AM    HEMATOCRIT 31.9* 08/31/2015 03:12 AM    PLATELETS 215 08/31/2015 03:12 AM    PT INR 1.1 08/20/2015 01:40 AM    PT 14.2 08/20/2015 01:40 AM    PTT 24 08/17/2015 05:21 PM    BUN 9.0 08/31/2015 03:12 AM    CREATININE 0.5* 08/31/2015 03:12 AM     GLUCOSE 108* 08/31/2015 03:12 AM    POTASSIUM 3.7 08/31/2015 03:12 AM         ASA PHYSICAL STATUS   (  )  ASA 1   HEALTHY PATIENT  (  )  ASA 2   MILD SYSTEMIC ILLNESS  (X  )  ASA 3   SYSTEMIC DISEASE, NOT INCAPACITATING  (  )  ASA 4   SEVERE SYSTEMIC DISEASE, DISEASE IS CONSTANT THREAT TO LIFE  (  )  ASA 5   MORIBUND CONDITION, NOT EXPECTED TO LIVE >24 HOURS IRRESPECTIVE OF PROCEDURE  (  )  E           EMERGENCY PROCEDURE       PLANNED SEDATION:   (X  ) NO SEDATION  (  ) MODERATE SEDATION  (  ) DEEP SEDATION WITH ANESTHESIA      CONCLUSION:   PATIENT HAS BEEN REASSESSED IMMEDIATELY PRIOR TO THE PROCEDURE   AND IS AN APPROPRIATE CANDIDATE FOR THE PLANNED SEDATION AND   PROCEDURE.  RISKS, BENEFITS AND ALTERNATIVES TO THE PLANNED   PROCEDURE AND SEDATION HAVE BEEN EXPLAINED TO THE PATIENT   OR GUARDIAN.    (  X)  YES  (  )  EMERGENCY CONSENT       Signed by: Marcy Siren, PA-C  [facility]-Section of Vascular & Interventional Radiology  Contact Numbers:  Regular business hours (8A-5P M-F):  [facility]: [phone] (option 3-outpatient scheduling, option 4-consults, option 5-inpatient procedures)  [facility]: [phone]  [facility]: [phone]  After hours/Answering service: [phone]

## 2015-08-31 NOTE — Sedation Documentation (Addendum)
1415 Pt identifier checked x2. Npo status confirmed. Procedure verified. Patient to procedure room via bed. Moved with slide board to table supine. Padded arm boards in place. Monitors attached. 4L NC continued. Warm blankets applied. Leg strap secured.   1230 prepped and draped in sterile fashion by tech, felicia.   1456 Dr. Eula Listen  Completed temporary ivc filter placement without complication. Patient tolerated well. No complaints of pain. VSS. Right groin sheath removed and pressure held at site by tech, felicia, until hemostasis. Right groin soft, no hematoma. Site dressed by tech.  Report called to nsicu rn, anish. Awaiting floor rn and transport.

## 2015-08-31 NOTE — Progress Notes (Signed)
08/31/15 2130   Respiratory Parameters - Non Ventilated   Status Completed   Resp Rate (!) 25   Vital capacity 450 mL   $ Vital Capacity Performed? Yes   Negative inspiratory force -30 cm H2o   $ NIF DONE Yes   Equipment labeled No   Adverse Reactions None

## 2015-08-31 NOTE — Progress Notes (Addendum)
[facility]- Neuro Critical Care Service [facility])                                                      Progress note       Date Time: 08/31/2015 9:35 AM  Patient Name: Ernest Diaz  Attending Physician: Maudry Mayhew, MD  Room: [phone]  Admit Date: 08/17/2015  LOS: 14 days      No chief complaint on file.    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 He was brought to [facility] and underwent angiography 3/6 followed by ACDF C6-7 and posterior laminectomy of the C6-7 on 3/9. The patient has developed weakness over the last few days in the lower and upper extremities and is being evaluated for urgent surgical decompression and correction of anterolisthesis of C6 upon C7 noted on the CT scan done urgently today.     24H events: Had transient pressor requirements (phelynephrine). Off since noon 3/19. Started on emperic antibiotics. Complaining on congestion of the lungs, a cough, and inability to bring up secretions. LE dopplers revealed a right femoral non-occlusive DVT.    ASSESSMENT               PLAN  Admit to ICU for maintenance of Spinal perfusion.   Urgent surgical evaluation and management underway by Dr Claudette Laws at this time.   Urinary retention  UTI with E fecalis 3/13  Dysphagia  Atrial fibrillation  Hypertension    Baseline MRS NEURO: Traumatic fall in the setting of home NOAC use s/p decompression and correction of anterolisthesis at C6-7.    - NSICU for spinal perfusion maintenance now off pressors  - Delirium on seroquel QHS  - Sleep protocol to maintain wake-sleep cycle  - C-collar in place    GOALS:   - Perfusion goals: MAP>65 now off pressors  - Na+ goal: 135-145  - Pain/Sedation: as needed  - Drains: JP drain   - PT/OT/Speech/swallow      CV: Now HDS with  MAPs in the 80s off phelynephrine.     - MAP goal >65 mmHg  - Continue midodrine 10 mg PO, will increase frequency to TID   - H/o AF off NOAC given cervical injury    PULM: Oxygenating well.    - Aspiration precautions for weak cough with mild delay in swallowing  - Serial VC and NIF by RRT q8h  - Last NIF -22, last VC 400 cc     GI: Seen by speech and swallow and is OK for sips of liquid and ice chips     - TF promote @ 50 with goal hold today for possible IVC filter placement  - GI PPX on pepcid  - Bowel regimen    RENAL: Follow electrolytes daily. Good renal function.     - I/O Goal: autoregulate  - Proscar for h/o urinary retention  - Foley removal trial, if no voiding will replace    ID: Afebrile. Started on vanc/zosyn empirically with concern for an aspiration event in the setting of hypotension on 3/19    - UCx grew enterococcus (Non-VRE) on zosyn  - Appreciate  ID input, will D/C vancomycin and continue zosyn with a stop date of 3/22    HEME: H/H and platelets stable.     - Right non-occlusive DVT planned for IVC filter placement  - Lovenox/SCDs for PPx    ENDO/RHEUM:   Glucose: <180n with SSI if needed. Fludrocort 0.2 mg daily. Will consider discontinuation if BP remains stable.    Lines/Foley: PIV, Foley for retention.   Daily labs: ordered.     Family Meeting:   Patient is currently able to make medical decisions.    Discussed current diagnoses, prognosis and goals of care.   Code status is Full at this time.   Code status Full Code    DISPO: Transfer to spine floor given hemodynamic stability (off pressors x24H).    Physical Exam:     Filed Vitals:    08/31/15 0800   BP:    Pulse:    Temp: 98.5 F (36.9 C)   Resp: 18   SpO2:         Intake/Output Summary (Last 24 hours) at 08/31/15 0935  Last data filed at 08/31/15 0800   Gross per 24 hour   Intake 1587.85 ml   Output    970 ml   Net 617.85 ml     Line, Drains, Airway:    General Appearance:    Mental status:  Sedation: None                Opens eyes  (spont/voice/pain/none): Spontaneous      Orientation: Oriented to person, place, situation, not to time.   Language: Fluent in english          Cranial Nerves  Pupils  OS: size/reactivity: 3 r  mm            OD: size/reactivity: blind since age 31                EOM(Diaz/IV/VI):  Intact with left eye.                                Visual field: Intact with left eye      Corneal(V/VII): Intact in left eye        Face(VII): Symmetric  Shoulder shrug/head turn(XI): 4/5         Cough/gag(IX/X): present                     Uvula (IX/X): not checked.                  Tongue(XII): midline        MOTOR  Upper Extremity   Drift   LEFT(of 5) 3/5 FC         RIGHT(of 5) 3/5 FC        Lower Extremity    LEFT(of 5) 0   RIGHT(of 5) 0 (wiggles toes)   Pronator drift: unable to assess.     Sensory: Absent sensation below T7  Cerebellar: No nystagmus or tremor  Reflexes/Tone/Babinki: not checked  HEENT: NC, AT, no icterus  CV: Irregular atrial fibrillation  PULM: Ronchi, diminished at bases  GI: Soft with bowel sounds  Ext: 2+ edema but no cyanosis    Labs:   Hematology:  Recent Labs      08/31/15   0312  08/30/15   0412  08/29/15   0624   WBC  9.05  11.93*  8.16   HGB  10.0*  9.9*  10.1*   HEMATOCRIT  31.9*  30.7*  31.5*   PLATELETS  215  273  246     No results for input(s): PT, INR, PTT in the last 72 hours.  Chemistry:  Recent Labs      08/31/15   0312  08/30/15   0412  08/29/15   0624   SODIUM  143  141  138   POTASSIUM  3.7  3.8  4.0   BUN  9.0  9.0  9.0   CREATININE  0.5*  0.5*  0.5*   GLUCOSE  108*  104*  133*     Rads:   Radiological Imaging personally reviewed  US Venous Duplex Doppler Leg Bilateral    08/30/2015  Nonocclusive deep venous thrombosis involving a short segment of the proximal femoral vein. These findings were discussed as an urgent report with the patient's nurse Yelena at p.m. on 08/30/2015. Colonel Bald, MD 08/30/2015 2:59 PM     Signed by: Tobie Lords, MD  Date/Time: 08/31/2015 9:35 AM  Neuro Critical  Care.   Attending MD [phone] (24hr) or [id number]  Midlevel Provider [phone] or [phone]      [facility] Attending Progress Note    Pt seen and examined. Meds, labs and imagistic studies reviewed. Agree with Dr Irving Shows A/P. Post traumatic cord compression in the setting of NOAC, s/p decompression, in NSICU for MAP goals for cord perfusion. Shock - sepsis vs neurogenic, resolved. On Midodrine and Florinef. Bolus IVF if recurrent hypotension, but unlikely. Continue Zosyn for Enterococcus UTI, d/c Vanco; ID input appreciated. Continue serial NIF and VC. High risk of pneumonia. IS. For IVC filter later on today.  Transfer to the floor.  D/w Dr Cleatis Polka.    CCM time x 35 min.    Jesusita Oka Adeleine Pask MD  08/31/2015 2:51 PM

## 2015-08-31 NOTE — Progress Notes (Signed)
ID PROGRESS NOTE    Date Time: 08/31/2015 10:08 AM  Patient Name: Ernest Diaz        Subjective, ROS:    Afebrile; VSS; sating at 100% on 4L NC; tmax 99.1; off levophed;   Nl wbc; vancomycin trough at 7.6 yesterday at 10am;   Is coughing; episode of sob last night. Currently denies cp/sob.  Is weak.    Antibiotics and culture results:   Antibiotics: zosyn #8, vanc #6    Cultures:  3/18 Post cervical wound: ngtd  - fungus: P  - afb: P    3/13 Ucx >100,000 E. Faecalis   3/13 Blood cx Ngtd  3/6 MRSA negative     3/9 path FIBROVASCULAR TISSUE, FEW VESSELS AND BONE CHIPS   Lines: PIV access; A line; 3/19 L SCV; foley   Physical Exam:     Filed Vitals:    08/31/15 1000   BP: 114/60   Pulse: 73   Temp:    Resp:    SpO2: 100%     General: in bed, sleepy but communicative, no distress, on RA. Elderly  HEENT: moist mucosa , blind in one eye, not pale, not icteric , cervical collar in place; ng tube  Scar on the neck partially examined: mild redness around the upper part. No drainage   Chest: S1S2 irregular , coarse BS, no wheeze   Abdomen:soft, not tender , BS +  Ext: no edema   Foley in place   Labs:       Recent CBC WITH DIFF   Recent Labs      08/31/15   0312   WBC  9.05   RBC  3.15*   HGB  10.0*   HEMATOCRIT  31.9*   MCV  101.3*       Recent CMP   Recent Labs      08/31/15   0312   GLUCOSE  108*   BUN  9.0   CREATININE  0.5*   SODIUM  143   POTASSIUM  3.7   CHLORIDE  106   CO2  27     Rads:   3/19 [place] lefg: nonocclusive DVT    3/17 xray No fracture acute osseous abnormality identified in the right shoulder, humerus or forearm    3/15 xray Minimal left basilar linear atelectasis or scarring.    3/8 MRI Postsurgical change with prior laminectomy. 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 There findings suspicious for a disc ligamentous injury at this level. Correlation with CT is advised    3/7 IR No angiographic evidence of intracranial aneurysm, arteriovenous  malformation or dural fistula. Is no angiographic evidence of spinal arteriovenous malformation or dural fistula. 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   Urinary retention, required reinsertion of foley.      Recent fever/SIRS on 3/12   Symptoms of aspiration with increased cough and mild-mod oropharyngeal dysphagia   Leukocytosis    Plan:   1. SIRS 2/2 #2 and #3  2. Possible aspiration pneumonia/pneumonitis.  3. E. Faecalis UTI cath related (Foley was removed/then replaced given retention)  4. Leukocytosis 2/2 #2, #3    On zosyn  [place] vancomycin as Enterococcus is sensitive to pcn  - finish 10 days zosyn.     Discussed with the patient, daughter at bedside and ICU team    Signed by: [place]. [id number]  Infectious Disease  Phone: [phone]  Fax: [phone]

## 2015-08-31 NOTE — Plan of Care (Signed)
Problem: Safety  Goal: Patient will be free from injury during hospitalization  Outcome: Progressing  Pt's bed in lowest position, 3/4 side rails up. Floor mat placed around bed. Pt is free from injury.    Problem: Pain  Goal: Patient's pain/discomfort is manageable  Outcome: Progressing  Pt's pain assessed using numeric scale. Pain at 0. Repositioning Q2H helped with further comfort of Pt.    Problem: Potential for Compromised Skin Integrity  Goal: Skin integrity is maintained or improved  Assess and monitor skin integrity. Identify patients at risk for skin breakdown on admission and per policy. Collaborate with interdisciplinary team and initiate plans and interventions as needed.   Outcome: Progressing  Venelex used on sacrum     Problem: Neurological Deficit  Goal: Neurological status is stable or improving  Outcome: Not Progressing  No change in Neuro assessment during shift

## 2015-08-31 NOTE — Consults (Signed)
NUTRITION:    Reason for Assessment: Tube feeding    Recommend:  Rec- Promote @ 85 ml /hr ( 2040 ml final volume / 2040 kcals / 1711 ml free water / 128 gm pro.)  If IVF d/c rec 240 ml free water flush every 6 hours.  Intervene per SLP recs and clinical course.    Clinical Update:  s/p decompression and correction of anterolisthesis at c6-7 3/18. Admit to ICU for maintenance of Spinal perfusion. Failed SLP eval- rec NPO and TF    Labs: Glucose 108     Meds: pepcid, proscar, florinef, proamatine, zosyn, miralax, seroquel, pericoace    Anthropometrics:  Height: 180.3 cm (5\' 11" )  Weight: 93 kg (205 lb 0.4 oz)  Body mass index is 28.61 kg/(m^2).  IBW: 172 # / 78 kg  Nutrition Goals: 1950-2340 kcals ( 25-30 kcals/kg IBW); 112-130 gm pro (1.2-1.4 gm pro/kg); 30-35 ml/kg fluid    Diet / Nutrition Support Order: Promote @ 70 ml/hr.    Nutrition Diagnosis:   Swallowing difficulty related to dysphagia as evidenced by SLP eval.    Monitor/Eval:  Monitor nutr support goals, labs, GI, med tx plan    Madison Hickman,  MS RD CDE CSO  [phone]

## 2015-08-31 NOTE — UM Notes (Signed)
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 Karel Jarvis, RN BSN  Utilization Review  [employer]  Tel: [phone]

## 2015-09-01 ENCOUNTER — Inpatient Hospital Stay: Payer: Medicare Other

## 2015-09-01 ENCOUNTER — Encounter: Payer: Self-pay | Admitting: Body Imaging

## 2015-09-01 LAB — CBC
Hematocrit: 32.9 % — ABNORMAL LOW (ref 42.0–52.0)
Hgb: 10.1 g/dL — ABNORMAL LOW (ref 13.0–17.0)
MCH: 31.7 pg (ref 28.0–32.0)
MCHC: 30.7 g/dL — ABNORMAL LOW (ref 32.0–36.0)
MCV: 103.1 fL — ABNORMAL HIGH (ref 80.0–100.0)
MPV: 10 fL (ref 9.4–12.3)
Nucleated RBC: 0 /100 WBC (ref 0–1)
Platelets: 205 10*3/uL (ref 140–400)
RBC: 3.19 10*6/uL — ABNORMAL LOW (ref 4.70–6.00)
RDW: 14 % (ref 12–15)
WBC: 8.44 10*3/uL (ref 3.50–10.80)

## 2015-09-01 LAB — BASIC METABOLIC PANEL
BUN: 9 mg/dL (ref 9.0–28.0)
CO2: 29 mEq/L (ref 22–29)
Calcium: 8.3 mg/dL (ref 7.9–10.2)
Chloride: 105 mEq/L (ref 100–111)
Creatinine: 0.5 mg/dL — ABNORMAL LOW (ref 0.7–1.3)
Glucose: 139 mg/dL — ABNORMAL HIGH (ref 70–100)
Potassium: 4.2 mEq/L (ref 3.5–5.1)
Sodium: 142 mEq/L (ref 136–145)

## 2015-09-01 LAB — GFR: EGFR: >60

## 2015-09-01 MED ORDER — PHENOL 1.4 % MT LIQD
1.0000 | OROMUCOSAL | Status: DC | PRN
Start: 2015-09-01 — End: 2015-09-24
  Filled 2015-09-01: qty 177

## 2015-09-01 MED ORDER — DOXAZOSIN MESYLATE 2 MG PO TABS
1.0000 mg | ORAL_TABLET | Freq: Once | ORAL | Status: DC
Start: 2015-09-01 — End: 2015-09-07
  Filled 2015-09-01: qty 1

## 2015-09-01 MED ORDER — DOXAZOSIN MESYLATE 2 MG PO TABS
2.0000 mg | ORAL_TABLET | Freq: Every evening | ORAL | Status: DC
Start: 2015-09-02 — End: 2015-09-07
  Administered 2015-09-05: 2 mg via ORAL
  Filled 2015-09-01 (×6): qty 1

## 2015-09-01 NOTE — Plan of Care (Signed)
Problem: Safety  Goal: Patient will be free from injury during hospitalization  Outcome: Progressing  Pt alert and oriented and able to communicate needs orally. Due to extremity weakness, pt unable to utilize room call bell system. Light touch call bell placed for pt use. Pt able to tap system to alert staff when needs require addressing. Reviewed fall prevention plan with pt. Pt understands his increased risk of falls. Fall mat next to bed. Bed alarm in use. Hourly rounding conducted.    Problem: Pain  Goal: Patient's pain/discomfort is manageable  Outcome: Progressing  Pt noted to rest comfortably except during repositioning activities. No pain medication administered. Aspen collar aligned and intact. Anterior neck surgical dressing dry and intact. Right groin dressing post IVC filter placement dry and intact. No redness, swelling or drainage noted. Neuro vascular status intact with +1 pedal pulses bilaterally. Distal extremities warm, pink with good capillary refill. Pt continues to report numbness in both lower extremities, left numbness greater than right side. Pt unable to move BLE. Upper extremity pulses +2, with good warmth and capillary refill. Unable to make fist or grip with either hand.     Problem: Potential for Compromised Skin Integrity  Goal: Skin integrity is maintained or improved  Assess and monitor skin integrity. Identify patients at risk for skin breakdown on admission and per policy. Collaborate with interdisciplinary team and initiate plans and interventions as needed.   Outcome: Progressing  Slight redness noted on sacral coccyx area. Area cleansed thouroughly, Veneflex applied with Mepelex to cover area. Pt repositioned.   Goal: Nutritional status is improving  Monitor and assess patient for malnutrition (ex- brittle hair, bruises, dry skin, pale skin and conjunctiva, muscle wasting, smooth red tongue, and disorientation). Collaborate with interdisciplinary team and initiate plan and  interventions as ordered. Monitor patient's weight and dietary intake as ordered or per policy. Utilize nutrition screening tool and intervene per policy. Determine patient's food preferences and provide high-protein, high-caloric foods as appropriate.   Outcome: Progressing  Pt NPO with a Core Pak in place via right nare. Promote infusing at 70 cc/hr. No residual obtained at 1000. Tube irrigated with 400 water after administration of medication this am. Abdomen soft and non tender. Faint bowel sounds noted. No nausea reported.     Comments:   Vital signs within normal limits. Oxygen saturation 97% on 3L/NC. Breath sounds diminished. Using incentive spirometer with encouragement. Pt unable to void post foley removal. Bladder scan revealing 385 cc urine, 10 hour after last straight catheterization. Dr. Wynetta Fines notified. Foley catheter inserted.

## 2015-09-01 NOTE — Progress Notes (Signed)
Pt transferred to 7 north via bed with all belongings. Report called to receiving unit nurse.

## 2015-09-01 NOTE — Plan of Care (Signed)
Problem: Safety  Goal: Patient will be free from injury during hospitalization  Outcome: Progressing    Problem: Pain  Goal: Patient's pain/discomfort is manageable  Outcome: Progressing

## 2015-09-01 NOTE — PT Eval Note (Signed)
[facility]   Physical Therapy RE-ASSESSMENT  Patient: Ernest Diaz    MRN#: [id number]   Unit: [facility] TOWER 8  Bed: F835/F835.01    Discharge Recommendations:   Discharge Recommendation: SNF   DME Recommendation: DME Recommended for Discharge:  (TBD at SNF)    If SNF recommended discharge disposition is not available, patient will need dependent level (max x2) assist for all mobility, hospital bed, hoyer lift, WC (tilt in space with pressure relief seat) for equipment, and HHPT.        Re-assessment:   Ernest Diaz is a 70 y.o. male admitted 08/17/2015.  Pt presents with significantly impaired mobility due to profound weakness/quadriparesis,  0/5 lower strength, impaired mobility due to recent quadriparesis following neck injury.  Pt is now S/P decreased strength, decreased endurance, decreased balance.  Pt requires max x2 assist for all mobility, Requires min-max assist to maintain sitting balance on edge of bed.  Pt tolerated sitting edge of bed ~8 minutes.  Pt will benefit from PT intervention to maximize functional independence    Therapy Diagnosis: impaired mobility    Rehabilitation Potential: Prognosis: Good;With continued PT status post acute discharge    Treatment Activities:   PT evaluation, bed mobility, neuro re-ed for sitting balance, postural awareness, therapeutic exercise and pt/ education, see below.      Patient Education:  Educated the patient to role of physical therapy, plan of care, goals and benefits of therapy and activity/mobilization, safety with mobility, spinal precautions, D/C recommendations, NPO status    Reason for reassessment:   New PT orders following surgical intervention for revision:    1.  Anterior cervical removal of retained hardware C6-C7.  2.  Redo posterior cervical decompression and internal reduction and  fixation of fracture dislocation with arthrodesis at C5 to T1 using  instrumentation.  3.  Anterior  cervical corpectomy partial C7 and arthrodesis C5 to C7 using  bone bank bone and Globus anterior instrumentation.    Plan:   Treatment/Interventions: Neuromuscular re-education;Functional transfer training;LE strengthening/ROM;Exercise;Endurance training;Patient/family training;Equipment eval/education;Bed mobility;Compensatory technique education     PT Frequency: 2-3x/wk     Risks/Benefits/POC Discussed with Pt/Family: With patient     Precautions   NPO  Cervical spine  Aspen collar    Activity orders:  Activity as tolerated    Consult received for Ernest Diaz for PT Evaluation and Treatment.    Patient's medical condition is appropriate for Physical therapy intervention at this time.    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 He was brought to [facility] and underwent angiography 3/6 followed by ACDF C6-7 and posterior laminectomy of the C6-7 on 3/9. The patient has developed weakness over the last few days in the lower and upper extremities and is being evaluated for urgent surgical decompression and correction of anterolisthesis of C6 upon C7 noted on the CT scan" Per chart    Procedure Date: 08/29/2015      SURGEON: Cheral Almas MD    PREOPERATIVE DIAGNOSIS:  Fracture dislocation C6-C7 with malpositioned hardware  TITLES OF PROCEDURES:  1.  Anterior cervical removal of retained hardware C6-C7.  2.  Redo posterior cervical decompression and internal reduction and  fixation of fracture dislocation with arthrodesis at C5 to T1  using  instrumentation.  3.  Anterior cervical corpectomy partial C7 and arthrodesis C5 to C7 using  bone bank bone and Globus anterior instrumentation.    US VENOUS LE DUPLEX DOPPLER BILATERAL  Nonocclusive deep venous thrombosis involving a short segment of the  proximal femoral vein.    IVC filter placement 08/31/2015      Past Medical/Surgical History:  Past Medical History   Diagnosis Date   . Hypertension    . Arthritis    . Pericarditis 1975   . Atrial fibrillation    . Arrhythmia    . S/P IVC filter 08/31/2015     temporary     Past Surgical History   Procedure Laterality Date   . Vein surgery     . Colonoscopy       x2   . 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 Eye surgery  1960     Rt eye removed   . Decompression, anterior cervical, fusion, levels 2 N/A 08/20/2015     Procedure: DECOMPRESSION, ANTERIOR CERVICAL, FUSION, LEVELS 2;  Surgeon: Madaline Savage, MD;  Location: [facility];  Service: Neurosurgery;  Laterality: N/A;  C5-C7 ACDF; AVM REMOVAL   . Decompression, posterior cervical, fusion, levels 2 N/A 08/20/2015     Procedure: DECOMPRESSION, POSTERIOR CERVICAL, FUSION, LEVELS 2;  Surgeon: Madaline Savage, MD;  Location: [facility];  Service: Neurosurgery;  Laterality: N/A;   . Ivc filter placement N/A 08/31/2015     Procedure: IVC FILTER PLACEMENT;  Surgeon: Leeroy Bock, MD;  Location: [facility];  Service: Interventional Radiology;  Laterality: N/A;         X-Rays/Tests/Labs:  Lab Results   Component Value Date/Time    HGB 10.1* 09/01/2015 05:11 AM    HEMATOCRIT 32.9* 09/01/2015 05:11 AM        Social History:   Prior Level of Function:  Prior level of function: Independent with ADLs  DME Currently at Home:  (Husband does not have DME, wife has a Youth worker)    Home Living Arrangements:  Living Arrangements: Spouse/significant other  Home  Layout: Two level, Bed/bath upstairs (1/2 bath on the main level)  DME Currently at Home:  (Husband does not have DME, wife has a Youth worker)    Subjective: Dizzy with sitting. Afraid of falling.  I feel thirsty.   Patient is agreeable to participation in the therapy session. Nursing clears patient for therapy.               Pt Goal:    Unstated    Pain Assessment:  Rating: Denies      Cognition  Awake, oriented. Able to follow simple commands      Objective:   Patient is in bed with telemetry, SCD's, peripheral IV, O2 at 3.5 liters/minute via nasal cannula, condom catheter and cervical collar in place.         Musculoskeletal Examination:  Gross ROM  Right Upper Extremity ROM:  (cervical precautions, AA WFL)  Left Upper Extremity ROM:  (cervical precautions, AA WFL)  Right Lower Extremity ROM:  (Passively WFL)  Left Lower Extremity ROM:  (  Passively WFL)    Gross Strength  Right Lower Extremity Strength: 0/5  Left Lower Extremity Strength: 0/5         Functional Mobility:  Rolling: Maximal Assist (x2)  Supine to Sit: Maximal Assist (x2)  Scooting to HOB: Maximal Assist (x2)  Scooting to EOB: Maximal Assist  Sit to Supine: Maximal Assist (x2)  Sit to Stand: Unable to assess (Comment)    Balance:  Sitting (static/dynamic): min-max/NT  Standing (static/dynamic):  NT         Participation and Activity Tolerance:  Participation Effort: good  Endurance:  (Fatigues easily)    AM-PACT Inpatient Short Forms  Inpatient AM-PACT Performed? (PT): Basic Mobility Inpatient Short Form  AM-PACT "6 Clicks" Basic Mobility Inpatient Short Form  Turning Over in Bed: A lot  Sitting Down On/Standing From Armchair: Unable  Lying on Back to Sitting on Side of Bed: A lot  Assist Moving to/from Bed to Chair: Total  Assist to Walk in Hospital Room: Total  Assist to Climb 3-5 Steps with Railing: Total  PT Basic Mobility Raw Score: 8  CMS 0-100% Score: 86.62%      Patient left with call bell within reach, all needs met, SCDs donned, fall mat  yes, bed alarm yes, chair alarm n/a and all questions answered. RN notified of session outcome and patient response.       Goals:   Goals  Goal Formulation: With patient  Time for Goal Acheivement: 7 visits  Goals: Select goal  Pt Will Go Supine To Sit: with moderate assist  Pt Will Perform Sit To Supine: with moderate assist  Pt Will Sit Edge of Bed: 6-10 min;with moderate assist  Pt Will Achieve Sitting Balance: 2/5 supports self independently with both UEs  Pt Will Perform Home Exer Program: with minimal assist         Time of treatment:   PT Received On: 09/01/15  Start Time: 1000  Stop Time: 1050  Time Calculation (min): 50 min      Thank you,    Suzan Garibaldi, PT, Pager [phone]

## 2015-09-01 NOTE — Progress Notes (Signed)
Pt arrived to unit via bed with ICU RN and CT at bedside. AOx4, forgetful at times. VSS. Oriented to room and call bell system. Will cont to monitor.

## 2015-09-01 NOTE — Progress Notes (Signed)
NIF -25, VC 280, patient alert at this time.

## 2015-09-01 NOTE — OT Eval Note (Signed)
[facility]   Occupational Therapy Reassessment    Patient: Ernest Diaz    MRN#: [id number]   Unit: [facility] TOWER 8  Bed: F835/F835.01                                     Discharge Recommendations:   Discharge Recommendation: SNF     DME Recommended for Discharge:  (TBD at SNF)    If SNF recommended discharge disposition is not available, patient will need total assist for adls, functional mobility equipment-. hospital bed, hoyer lift, WC (tilt in space with pressure relief seat) and HHOT    Assessment:   Ernest Diaz is a 70 y.o. male admitted 08/17/2015. Pt presents with significantly impaired mobility and adl I due to profound weakness/quadriparesis, 0/5 lower strength, impaired mobility due to recent quadriparesis following neck injury. Pt is now S/P decreased strength, ADL I, decreased endurance, decreased balance. Pt requires max x2 assist for all mobility and adls, Requires min-max assist to maintain sitting balance on edge of bed. Pt tolerated sitting edge of bed ~8 minutes. Pt will benefit from OT intervention to maximize functional independence and safety.    Impairments:  decreased adl I, Functional mobility    Therapy Diagnosis: decreased adl I, functional mobility    Rehabilitation Potential:   good with contd OT    Treatment Activities: OT RE-val,  Adl re training, functional transfer training    Educated the patient to role of occupational therapy, plan of care, goals of therapy and HEP, safety with mobility and ADLs.    Plan:   OT Frequency Recommended: 2-3x/wk           Risks/benefits/POC discussed with patient       Reason for reassessment:  New OT orders following surgical intervention for revision:    1. Anterior cervical removal of retained hardware C6-C7.  2. Redo posterior cervical decompression and internal reduction and  fixation of fracture dislocation with arthrodesis at C5 to T1 using  instrumentation.  3. Anterior  cervical corpectomy partial C7 and arthrodesis C5 to C7 using  bone bank bone and Globus anterior instrumentation.      Precautions and Contraindications:     NPO  Cervical spine  Aspen collar      Consult received for Ernest Diaz for OT Evaluation and Treatment.  Patient's medical condition is appropriate for Occupational Therapy intervention at this time.    History of Present Illness:   Updated History of Present Illness: Ernest Diaz is a 70 y.o. male . Please see Eval for more history.    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 No fracture acute osseous abnormality identified in the right shoulder, humerus or forearm. Olen Pel, MD 08/28/2015 3:46 AM     Xr Forearm Complete Right    08/28/2015  1. No fracture acute osseous abnormality identified in the right shoulder, humerus or forearm. Olen Pel, MD 08/28/2015 3:46 AM     Ct Cervical Spine Wo Contrast    08/28/2015  ADDENDUM:  On rereview of imaging, there are bilateral jumped facets.  Neldon Mc, MD 08/28/2015 10:41 PM     08/28/2015  1.  Markedly  abnormal examination with anterolisthesis of C6 upon C7 measuring 16 mm with a jumped facet on the left and a comminuted displaced fracture involving the right C7 articular facet. 2.  Anterior cervical spinal fusion hardware is malpositioned with both plate and screws extending into the C6-C7 disc space. 3.  The AP dimension of the bony spinal canal at the superior C5 level is reduced to 5 mm. These critical results were discussed with Payton Spark, PA at 9:36 PM on 08/28/2015. Neldon Mc, MD 08/28/2015 9:36 PM     Fluoroscopy Less Than 1 Hour    08/29/2015   Fluoroscopy provided. Prince Solian, MD 08/29/2015 8:10 AM     Xr Shoulder Right 2+ Views    08/28/2015  1. No fracture acute  osseous abnormality identified in the right shoulder, humerus or forearm. Olen Pel, MD 08/28/2015 3:46 AM     Xr Chest Ap Portable    08/30/2015  . Line tip SVC with no pneumothorax Marty Heck, MD 08/30/2015 3:12 AM     Xr Chest Ap Portable    08/26/2015   Minimal left basilar linear atelectasis or scarring. Pennelope Bracken, MD 08/26/2015 7:03 AM     US Venous Duplex Doppler Leg Bilateral    08/30/2015  Nonocclusive deep venous thrombosis involving a short segment of the proximal femoral vein. These findings were discussed as an urgent report with the patient's nurse Yelena at p.m. on 08/30/2015. Colonel Bald, MD 08/30/2015 2:59 PM     Xr Abdomen Portable    08/28/2015   Tip of the enteric tube in the body of the stomach. Colonel Bald, MD 08/28/2015 5:38 PM     Xr Abdomen Portable    08/28/2015   Nasogastric tube tip at or just below the gastroesophageal junction. Remer Macho, MD 08/28/2015 2:45 PM     Ivc Filter Placement    08/31/2015   Successful deployment of a retrievable inferior vena cava filter. Contrast material: 15 cc Omnipaque. Fluoroscopy time 0.7 minutes. Air kerma 123 mGy. Robbie Lis, MD 08/31/2015 6:22 PM       Subjective:I am thirsty     Patient is agreeable to participation in the therapy session.     Pain:none stated    Patient goal- move              Objective:        Observation of Patient/Vital Signs:  Patient is in bed with SCD's, peripheral IV, O2 at 2 liters/minute via nasal cannula, NG tube/NJ tube/Corpak, condom catheter and cervical collar in place.    Cognitive Status and Neuro Exam:     Intact for cognition  Co-ordination- impaired            Musculoskeletal Examination     B UE-AAROM WFL.    B UE Strength- -3/5 at shoulder  3/5 at elbow, 2/5 at wrist, no isolated finger strength noted                  Sensory/Oculomotor Examination   patient reports tingling and numbness in B ue's  Vision- patient tends to keep eyes closed an hard to assess vision accurately          Activities of Daily Living    Eating: can bring hand to mouth  Grooming: max assist  Bathing: total a  UE Dressing: max a  LE Dressing:dependent  Toileting: catheter    Functional Mobility:    Rolling: Maximal Assist (x2)  Supine to Sit: Maximal Assist (x2)  Scooting to HOB: Maximal Assist (x2)  Scooting to EOB: Maximal Assist  Sit to Supine: Maximal Assist (x2)  Sit to Stand: Unable to assess (Comment)       Balance    Sitting (static/dynamic): min-max/NT  Standing (static/dynamic): NT    Participation and Activity Tolerance     Participation Effort: good  Endurance: (Fatigues easily)    Patient left with call bell within reach, all needs met, SCDs on, fall mat on, bed alarm on, chair alarm n/a and all questions answered. RN notified of session outcome and patient response.       Goals:  Time For Goal Achievement: 5 visits  ADL Goals  Patient will groom self: Minimal Assist  Patient will dress upper body: Moderate Assist (with modified techniques)  Patient will dress lower body: Moderate Assist, with AE  Pt will complete bathing: Moderate Assist  Patient will toilet: Moderate Assist  Mobility and Transfer Goals  Pt will perform functional transfers: Moderate Assist  Neuro Re-Ed Goals  Pt will perform dynamic sitting balance: Minimal Assist, to increase ability to complete ADLs  Pt will sit at edge of bed: Stand by Assist, to prepare for OOB tasks  Musculoskeletal Goals  Pt will perform Home Exercise Program: stand by assist, with caregiver/family assist, to increase engagement in ADLs                          Time of treatment:   OT Received On: 09/01/15  Start Time: 1040  Stop Time: 1120  Time Calculation (min): 40 min      Tora Perches OTR/L  Pager [phone]

## 2015-09-01 NOTE — Progress Notes (Signed)
Unable to ascultate air when pushed thorough nasogastric tube. No residual. Lungs w/ ronchi and diminished, TF stopped and Ab XR ordered. Per radiologist there was no evidence of nasogastric tube. MD paged. Sats low on 4L O2. Ordered CXR, ABG, and for patient be transferred to higher level of care. High aspiration risk. Family notified - voicemail left with son about transfer. Transferred to 715.

## 2015-09-01 NOTE — Progress Notes (Signed)
Neurosurgery Progress Note  POD 3  S:   S/p IVC filter placement    O:   Filed Vitals:    09/01/15 0828   BP: 137/64   Pulse: 70   Temp: 99 F (37.2 C)   Resp: 16   SpO2:          PHYSICAL EXAM:  Awake, alert  Speech is fluent  Anterior and posterior incisions are dry and intact  Denies neck pain        A/P:   Neuro without change  Continue Aspen collar  PT/OT  Rehab when medical issues controlled      Rella Larve, MD

## 2015-09-01 NOTE — Progress Notes (Signed)
Respiratory care note:  Negative inspiratory force this AM: -25 and his vital capacity is 275 ml.  Pt was alert and oriented with good effort.  R.Shadrick Senne RRT/NPS

## 2015-09-01 NOTE — Progress Notes (Addendum)
ID PROGRESS NOTE    Date Time: 09/01/2015 9:03 AM  Patient Name: Ernest Diaz        Subjective, ROS:    Afebrile. Hemodynamically stable   Stable labs     Nurse at bedside     Awake and  communicative   No fever , no chills   Cough ++. Denies SOB  Tolerating TF  Foley has been removed not able to urinate  Weak in arms and legs      Antibiotics and culture results:   Antibiotics: zosyn #9.   S/P vanc       Cultures:  3/20 flu negative   3/18 cervical wound cx cut flora  3/18 cervical wound anaerobic cx ngtd  3/18 cervical wound AFB and fungal cx P  3/13 Ucx >100,000 E. Faecalis   3/13 Blood cx Ngtd  3/6 MRSA negative     3/9 path FIBROVASCULAR TISSUE, FEW VESSELS AND BONE CHIPS       Lines: PIV access    Physical Exam:     Filed Vitals:    09/01/15 0828   BP: 137/64   Pulse: 70   Temp: 99 F (37.2 C)   Resp: 16   SpO2:        General: in bed, awake and communicative, no distress, coughing. Nurse at bedside.  HEENT: moist mucosa , blind in one eye, not pale, not icteric , cervical collar in place, scar not examined   Chest: S1S2 irregular , few crackles bilaterally, no wheeze   Abdomen:soft, not tender , BS +  Ext: + edema   Weakness both arms. Didn't move legs       Labs:       Recent CBC WITH DIFF   Recent Labs      09/01/15   0511   WBC  8.44   RBC  3.19*   HGB  10.1*   HEMATOCRIT  32.9*   MCV  103.1*       Recent CMP   Recent Labs      09/01/15   0352   GLUCOSE  139*   BUN  9.0   CREATININE  0.5*   SODIUM  142   POTASSIUM  4.2   CHLORIDE  105   CO2  29           Rads:   3/20 IRSuccessful deployment of a retrievable inferior vena cava filter    3/19 doppler Nonocclusive deep venous thrombosis involving a short segment of the proximal femoral vein.    3/17 CT  Markedly abnormal examination with anterolisthesis of C6 upon C7  measuring 16 mm with a jumped facet on the left and a comminuted displaced fracture involving the right C7 articular facet. Anterior cervical spinal fusion hardware is  malpositioned with both plate and screws extending into the C6-C7 disc space.  The AP dimension of the bony spinal canal at the superior C5 level is reduced to 5 mm.     3/17 xray No fracture acute osseous abnormality identified in the right shoulder, humerus or forearm    3/15 xray Minimal left basilar linear atelectasis or scarring.    3/8 MRI Postsurgical change with prior laminectomy. 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 There findings suspicious for a disc ligamentous injury at this level. Correlation with CT is advised    3/7 IR No angiographic evidence of intracranial aneurysm, arteriovenous malformation or dural fistula. Is no angiographic evidence of spinal arteriovenous malformation  or dural fistula.  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   - worsening of weakness due to Fracture dislocation C6-C7 with malpositioned hardware  - S/P C5-T1 posterior fusion, C5-C7 anterior fusion with partial C7 corpectomy on 3/18. cx normal flora     Other issues during admission:  Fever/SIRS on 3/12   Symptoms of aspiration with increased cough and mild-mod oropharyngeal dysphagia   Urinary retention, required reinsertion of foley and E. Faecalis UTI  Leukocytosis  DVT   - S/P IVC filter       Plan:   1. SIRS.   Was due to UTI and possible aspiration.   Currently afebrile and hemodynamically stable.     2. Possible aspiration pneumonia/pneumonitis.  Now with severe oropharyngeal dysphagia  (worsenign during admission)  Last xray with No new infiltrates.     On zosyn   Pulmonary toilet   Aspiration precaution.       3. E. Faecalis UTI.   Cath related   Foley was removed/then replaced given retention   Has finished the course.      Foley has been removed again, hasn't urinated yet.   Will probably need cath again  Per primary     4. 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   S/P C5-T1 posterior fusion, C5-C7 anterior fusion with partial C7 corpectomy on 3/18. cx normal flora     On zosyn   Will discuss with neurosurgery about need for antibiotics.     5. Leukocytosis.   Due to above.   Resolved.     Discussed with nursing   Discussed with Dr. Jolee Ewing,    Signed by: [place]. [id number]  Infectious Disease  Phone: [phone]  Fax: [phone]

## 2015-09-01 NOTE — Progress Notes (Signed)
MEDICINE PROGRESS NOTE    Date Time: 09/01/2015 1:56 PM  Patient Name: Ernest Diaz  Attending Physician: Isaias Cowman, MD    Assessment:   Principal Problem:    Quadriplegia  Active Problems:    Atrial fibrillation    Shock    Cord compression  Resolved Problems:    Thoracic spinal AV fistula      Plan:   1. Traumatic cervicothoracic epidural hematoma s/p C3-C5 + T3-T4 decompressive laminectomies 2/27 by Dr. 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 Jaynie Collins on 08/20/15  [facility] course complicated by fracture dislocation C6-C7 with malpositioned hardware s/p C5-T1 posterior fusion, C5-7 anterior fusion with partial C7 corpectomy on 3/18 with NSGY  - Now requiring Midodrine 10 mg TID and Florinef 0.2 mg q day for BP support for autonomic dysfunction, MAP goal > 65  - Will need outpatient evaluation by NSGY prior to clearance for [facility] or antiplatelets   - C-collar at all times, fall precautions in place     2. Hx A Fib on Eliquis, CHADS2VASC of 2   - Rate controlled without agents, Atenolol discontinued given MAP goals > 65 and requiring Florinef and Midodrine  -Patient asymptomatic with appropriate chronotropic response  -Hold AC and antiplatelets given procedure at this time  -Discussed with patient, aware of increased risk of CVA without AC or antiplatelets however also understands contraindication from NSGY perspective given risk of bleeding into spine, will need clearance from Dr. Jaynie Collins prior to restarting as an outpatient     3. Urinary Retention  - Bladder scan without Foley > 300 cc, replaced Foley   - Likely due to recent neurosurgical procedure  - Unable to have Flomax due to sulfa allergy, started on Proscar   - Will obtain Renal US for further evaluation     4. SIRS- Likely due  to aspiration PNA and Enterococcus UTI  - On Zosyn for 10 day course, end date 09/03/15  - Had previously been on Vancomycin however discontinued given Enterococcus sensitive to PCN  - Leukocytosis has resolved   - ID following, appreciate recommendations    5. Dysphagia- Likely due to recent cervical spine intervention  - NPO with NG tube at this time  - SLP following  - May need PEG placement for long term nutrition  - Aspiration precautions in place     6. RLE DVT- Unable to have AC due to recent surgery   - IVC filter placed by IR on 08/31/15  - Will need NSGY clearance prior to starting [facility] as an outpatient    DVT PPX: Lovenox (cleared for chemical ppx only by NSGY at this time)    Case discussed with: Patient, RN    Safety Checklist:     DVT prophylaxis:  CHEST guideline (See page [id number]) Chemical   Foley:  [place] Rn Foley protocol Present and continue: Indication: Urinary retention   IVs:  Peripheral IV   PT/OT: Ordered   Daily CBC & or Chem ordered:  SHM/ABIM guidelines (see #5) Yes, due to clinical and lab instability   Reference for approximate charges of common labs: CBC auto diff - $76  BMP - $99  Mg - $79    Lines:     Patient Lines/Drains/Airways Status  Active PICC Line / CVC Line / PIV Line / Drain / Airway / Intraosseous Line / Epidural Line / ART Line / Line / Wound / Pressure Ulcer / NG/OG Tube     Name:   Placement date:   Placement time:   Site:   Days:    CVC Triple Lumen 08/30/15 Left Subclavian  08/30/15   0040   Subclavian   2    Peripheral IV 08/27/15 Right Antecubital  08/27/15   1100   Antecubital   5    Peripheral IV 08/29/15 Left Hand  08/29/15   1345   Hand   3    Peripheral IV 08/30/15 Right Antecubital  08/30/15   0030   Antecubital   2    Urethral Catheter Non-latex 16 Fr.  09/01/15   1118   Non-latex   less than 1    Arterial Line Left               Wound 08/25/15 Pressure Injury Buttocks Left;Right Non-Blanchable Erythema  08/25/15   2258   Buttocks   6    Incision Site 08/20/15  Neck  08/20/15   1427     11    Incision Site 08/20/15 Neck  08/20/15   1932     11    Incision Site 08/28/15 Neck  08/28/15   2352     3    Incision Site 08/29/15 Neck  08/29/15   0515     3    Feeding Tube Nasogastric Left nare  08/28/15      Left nare   4                 Disposition: (Please see PAF column for Expected D/C Date)   Today's date: 09/01/2015  Admit Date: 08/17/2015  4:29 PM  LOS: 15  Clinical Milestones: Neuro evaluation   Anticipated discharge needs: Per PT/OT recs      Subjective     CC: Quadriplegia    Interval History/24 hour events: No acute events overnight.  Denies chest pain, shortness of breath, abdominal pain.  No significant improvement in LE strength.  Still continues to have dysphagia.  Denies other acute concerns/complaints at this time.         Review of Systems:     Review of Systems - Negative except as noted above in HPI      Physical Exam:     VITAL SIGNS PHYSICAL EXAM   Temp:  [97.5 F (36.4 C)-99.4 F (37.4 C)] 99.4 F (37.4 C)  Heart Rate:  [55-80] 75  Resp Rate:  [14-25] 18  BP: (114-149)/(56-78) 123/61 mmHg      Intake/Output Summary (Last 24 hours) at 09/01/15 1356  Last data filed at 09/01/15 1300   Gross per 24 hour   Intake   1330 ml   Output    830 ml   Net    500 ml    Physical Exam  General: awake, alert X 3  HEENT: NG tube in place   Neck: Aspen collar in place  Cardiovascular: irregularly irregular, no murmurs, rubs or gallops  Lungs: coarse breath sounds bilaterally, no wheezing, no rales, mild ronchi  Abdomen: soft, non-tender, non-distended; no palpable masses, normoactive bowel sounds, no rebound, no guarding  Extremities: no edema  Neuro: CN grossly intact, speech fluent, follows commands, 4/5 b/l UE (except for grip 3/5 b/l in UE) and 0/5 b/l LE strength       Meds:  Medications were reviewed:    Labs:     Labs (last 72 hours):      Recent Labs  Lab 09/01/15  0511 08/31/15  0312   WBC 8.44 9.05   HGB 10.1* 10.0*   HEMATOCRIT 32.9* 31.9*   PLATELETS 205  215            Recent Labs  Lab 09/01/15  0352 08/31/15  0312   SODIUM 142 143   POTASSIUM 4.2 3.7   CHLORIDE 105 106   CO2 29 27   BUN 9.0 9.0   CREATININE 0.5* 0.5*   CALCIUM 8.3 8.1   GLUCOSE 139* 108*                   Microbiology, reviewed and are significant for:  Microbiology Results     Procedure Component Value Units Date/Time    AFB culture and smear [[id number]] Collected:  08/29/15 0200    Specimen Information:  Sputum from Wound Updated:  08/29/15 1142    Narrative:      ORDER#: [id number]                                    ORDERED BY: Nicoletta Dress  SOURCE: Wound POSTERIOR CERVICAL WOUND               COLLECTED:  08/29/15 02:00  ANTIBIOTICS AT COLL.:                                RECEIVED :  08/29/15 05:54  Stain, Acid Fast                           FINAL       08/29/15 11:42  08/29/15   No Acid Fast Bacillus Seen  Culture Acid Fast Bacillus (AFB)           PENDING      Anaerobic culture [[id number]] Collected:  08/29/15 0200    Specimen Information:  Other from Wound Updated:  08/31/15 1035    Narrative:      ORDER#: [id number]                                    ORDERED BY: Nicoletta Dress  SOURCE: Wound POSTERIOR CERVICAL WOUND               COLLECTED:  08/29/15 02:00  ANTIBIOTICS AT COLL.:                                RECEIVED :  08/29/15 05:54  Culture, Anaerobic Bacteria                PRELIM      08/31/15 10:35  08/31/15   No growth to date, final report to follow      CULTURE BLOOD AEROBIC AND ANAEROBIC [[id number]] Collected:  08/24/15 0909    Specimen Information:  Blood, Venipuncture Updated:  08/29/15 1521    Narrative:      ORDER#: [id number]  ORDERED BY: Clotilde Loth, VIN  SOURCE: Blood, Venipuncture peripheral               COLLECTED:  08/24/15 09:09  ANTIBIOTICS AT COLL.:                                RECEIVED :  08/24/15 13:17  Culture Blood Aerobic and Anaerobic        FINAL       08/29/15 15:21  08/29/15   No growth after 5 days of incubation.       Fungus culture [[id number]] Collected:  08/29/15 0200    Specimen Information:  Other from Wound Updated:  08/29/15 1201    Narrative:      ORDER#: [id number]                                    ORDERED BY: Nicoletta Dress  SOURCE: Wound POSTERIOR CERVICAL WOUND               COLLECTED:  08/29/15 02:00  ANTIBIOTICS AT COLL.:                                RECEIVED :  08/29/15 05:54  Stain, Fungal                              FINAL       08/29/15 12:01  08/29/15   No Fungal or Yeast Elements Seen  Culture Fungus                             PENDING      MRSA culture [[id number]] Collected:  08/17/15 2043    Specimen Information:  Body Fluid from Nasal/Throat ASC Admission Updated:  08/19/15 0328    Narrative:      ORDER#: [id number]                                    ORDERED BY: Paulita Fujita  SOURCE: Nares and Throat                             COLLECTED:  08/17/15 20:43  ANTIBIOTICS AT COLL.:                                RECEIVED :  08/18/15 03:03  Culture MRSA Surveillance                  FINAL       08/19/15 03:28  08/19/15   Negative for Methicillin Resistant Staph aureus from Nares and             Negative for Methicillin Resistant Staph aureus from Throat      Rapid influenza A/B antigens [[id number]] Collected:  08/31/15 1649    Specimen Information:  Nasopharyngeal from Nasal Aspirate Updated:  08/31/15 1727    Narrative:      ORDER#: [id number]  ORDERED BY: OWEIS, EMIL  SOURCE: Nasal Aspirate                               COLLECTED:  08/31/15 16:49  ANTIBIOTICS AT COLL.:                                RECEIVED :  08/31/15 17:04  Influenza Rapid Antigen A&B                FINAL       08/31/15 17:27  08/31/15   Negative for Influenza A and B             Reference Range: Negative      Urine culture [[id number]] Collected:  08/24/15 1718    Specimen Information:  Urine from Urine, Catheterized, Foley Updated:  08/26/15 1544    Narrative:      ORDER#: [id number]                                     ORDERED BY: Alysia Penna  SOURCE: Urine, Catheterized, Foley                   COLLECTED:  08/24/15 17:18  ANTIBIOTICS AT COLL.:                                RECEIVED :  08/24/15 23:42  Culture Urine                              FINAL       08/26/15 15:43   +  08/26/15   >100,000 CFU/ML Enterococcus faecalis      _____________________________________________________________________________                                   E.faecalis     ANTIBIOTICS                     MIC  INTRP      _____________________________________________________________________________  Ampicillin                       1     S        Gentamicin High Level [place] <=500   S        Levofloxacin                    <=1    S        Nitrofurantoin                 <=16    S  D1    Penicillin                       4     S        Streptomycin High Level Resist<=1000   S        Tetracycline                    >8     R  Vancomycin                       2     S          -----DRUG COMMENTS----------    D1:  Nitrofurantoin should only be used for the treatment of         uncomplicated cystitis.         [facility] Antimicrobial Subcommittee June 2015  _____________________________________________________________________________            S=SUSCEPTIBLE     I=INTERMEDIATE     R=RESISTANT                            N/S=NON-SUSCEPTIBLE  _____________________________________________________________________________      Wound culture & gram stain [[id number]] Collected:  08/29/15 0200    Specimen Information:  Wound from Wound Updated:  08/31/15 1209    Narrative:      ORDER#: [id number]                                    ORDERED BY: Nicoletta Dress  SOURCE: Wound POSTERIOR CERVICAL WOUND               COLLECTED:  08/29/15 02:00  ANTIBIOTICS AT COLL.:                                RECEIVED :  08/29/15 05:54  Stain, Gram                                FINAL       08/29/15 08:45  08/29/15   No Squamous epithelial cells seen              Rare WBCs             No organisms seen  Culture and Gram Stain, Aerobic, Wound     FINAL       08/31/15 12:09   +  08/31/15   Very light growth of mixed cutaneous flora              Imaging, reviewed and are significant for:  Ivc Filter Placement    08/31/2015   Successful deployment of a retrievable inferior vena cava filter. Contrast material: 15 cc Omnipaque. Fluoroscopy time 0.7 minutes. Air kerma 123 mGy. Robbie Lis, MD 08/31/2015 6:22 PM           Signed by: Isaias Cowman, MD

## 2015-09-01 NOTE — Progress Notes (Addendum)
DISCHARGE PLANNING    Anticipated Dispo:     When medically stable, [facility] SNF.     I met with pt and wife. Confirmed 1st choice dispo is [facility]. I updated clinicals for [facility] and all SNFs in [employer].     Transportation:     Emergency planning/management officer.    Kelton Pillar, MSW, [facility]  Care Coordinator  [facility]  [place] [address]  Spectra [phone]

## 2015-09-02 ENCOUNTER — Inpatient Hospital Stay: Payer: Medicare Other

## 2015-09-02 DIAGNOSIS — J9602 Acute respiratory failure with hypercapnia: Secondary | ICD-10-CM

## 2015-09-02 DIAGNOSIS — J9600 Acute respiratory failure, unspecified whether with hypoxia or hypercapnia: Secondary | ICD-10-CM | POA: Diagnosis not present

## 2015-09-02 DIAGNOSIS — I82411 Acute embolism and thrombosis of right femoral vein: Secondary | ICD-10-CM

## 2015-09-02 DIAGNOSIS — Z515 Encounter for palliative care: Secondary | ICD-10-CM

## 2015-09-02 DIAGNOSIS — J690 Pneumonitis due to inhalation of food and vomit: Secondary | ICD-10-CM | POA: Diagnosis not present

## 2015-09-02 DIAGNOSIS — Z7189 Other specified counseling: Secondary | ICD-10-CM

## 2015-09-02 DIAGNOSIS — I4891 Unspecified atrial fibrillation: Secondary | ICD-10-CM | POA: Insufficient documentation

## 2015-09-02 DIAGNOSIS — J9601 Acute respiratory failure with hypoxia: Secondary | ICD-10-CM

## 2015-09-02 DIAGNOSIS — Z66 Do not resuscitate: Secondary | ICD-10-CM

## 2015-09-02 DIAGNOSIS — R1312 Dysphagia, oropharyngeal phase: Secondary | ICD-10-CM

## 2015-09-02 LAB — BLOOD GAS, ARTERIAL
Arterial Total CO2: 33.1 mEq/L — ABNORMAL HIGH (ref 24.0–30.0)
Base Excess, Arterial: 5.6 mEq/L — ABNORMAL HIGH (ref <=2.0)
FIO2: 100 %
HCO3, Arterial: 31.4 mEq/L — ABNORMAL HIGH (ref 23.0–29.0)
O2 Sat, Arterial: 99.5 % (ref 95.0–100.0)
Temperature: 37
pCO2, Arterial: 56.1 mmhg — ABNORMAL HIGH (ref 35.0–45.0)
pH, Arterial: 7.367 (ref 7.350–7.450)
pO2, Arterial: 122 mmhg — ABNORMAL HIGH (ref 80.0–90.0)

## 2015-09-02 LAB — GLUCOSE WHOLE BLOOD - POCT
Whole Blood Glucose POCT: 143 mg/dL — ABNORMAL HIGH (ref 70–100)
Whole Blood Glucose POCT: 134 mg/dL — ABNORMAL HIGH (ref 70–100)

## 2015-09-02 LAB — CBC AND DIFFERENTIAL
Basophils Absolute Automated: 0.02 10*3/uL (ref 0.00–0.20)
Basophils Automated: 0 %
Eosinophils Absolute Automated: 0 10*3/uL (ref 0.00–0.70)
Eosinophils Automated: 0 %
Hematocrit: 33 % — ABNORMAL LOW (ref 42.0–52.0)
Hgb: 10 g/dL — ABNORMAL LOW (ref 13.0–17.0)
Immature Granulocytes Absolute: 0.01 10*3/uL
Immature Granulocytes: 0 %
Lymphocytes Absolute Automated: 0.67 10*3/uL (ref 0.50–4.40)
Lymphocytes Automated: 8 %
MCH: 31.5 pg (ref 28.0–32.0)
MCHC: 30.3 g/dL — ABNORMAL LOW (ref 32.0–36.0)
MCV: 104.1 fL — ABNORMAL HIGH (ref 80.0–100.0)
MPV: 9.8 fL (ref 9.4–12.3)
Monocytes Absolute Automated: 0.55 10*3/uL (ref 0.00–1.20)
Monocytes: 7 %
Neutrophils Absolute: 7.12 10*3/uL (ref 1.80–8.10)
Neutrophils: 85 %
Nucleated RBC: 0 /100 WBC (ref 0–1)
Platelets: 200 10*3/uL (ref 140–400)
RBC: 3.17 10*6/uL — ABNORMAL LOW (ref 4.70–6.00)
RDW: 14 % (ref 12–15)
WBC: 8.37 10*3/uL (ref 3.50–10.80)

## 2015-09-02 LAB — GFR: EGFR: >60

## 2015-09-02 LAB — BASIC METABOLIC PANEL
BUN: 12 mg/dL (ref 9.0–28.0)
CO2: 34 mEq/L — ABNORMAL HIGH (ref 22–29)
Calcium: 8.3 mg/dL (ref 7.9–10.2)
Chloride: 107 mEq/L (ref 100–111)
Creatinine: 0.5 mg/dL — ABNORMAL LOW (ref 0.7–1.3)
Glucose: 121 mg/dL — ABNORMAL HIGH (ref 70–100)
Potassium: 4.1 mEq/L (ref 3.5–5.1)
Sodium: 147 mEq/L — ABNORMAL HIGH (ref 136–145)

## 2015-09-02 MED ORDER — FUROSEMIDE 10 MG/ML IJ SOLN
20.0000 mg | Freq: Once | INTRAMUSCULAR | Status: DC
Start: 2015-09-02 — End: 2015-09-02

## 2015-09-02 MED ORDER — DEXTROSE 50 % IV SOLN
25.0000 mL | INTRAVENOUS | Status: DC | PRN
Start: 2015-09-02 — End: 2015-09-24

## 2015-09-02 MED ORDER — DEXTROSE-SODIUM CHLORIDE 5-0.45 % IV SOLN
INTRAVENOUS | Status: DC
Start: 2015-09-02 — End: 2015-09-02

## 2015-09-02 MED ORDER — DEXTROSE-SODIUM CHLORIDE 5-0.45 % IV SOLN
INTRAVENOUS | Status: DC
Start: 2015-09-02 — End: 2015-09-03
  Administered 2015-09-02: 1 mL via INTRAVENOUS

## 2015-09-02 MED ORDER — GLUCOSE 40 % PO GEL
15.0000 g | ORAL | Status: DC | PRN
Start: 2015-09-02 — End: 2015-09-24

## 2015-09-02 MED ORDER — GLUCAGON 1 MG IJ SOLR (WRAP)
1.0000 mg | INTRAMUSCULAR | Status: DC | PRN
Start: 2015-09-02 — End: 2015-09-24

## 2015-09-02 MED ORDER — ACETYLCYSTEINE 10 % IN SOLN
3.0000 mL | Freq: Four times a day (QID) | RESPIRATORY_TRACT | Status: AC
Start: 2015-09-02 — End: 2015-09-04
  Administered 2015-09-02 (×2): 4 mL via RESPIRATORY_TRACT
  Administered 2015-09-02: 3 mL via RESPIRATORY_TRACT
  Administered 2015-09-03: 4 mL via RESPIRATORY_TRACT
  Administered 2015-09-03: 3 mL via RESPIRATORY_TRACT
  Administered 2015-09-03: 4 mL via RESPIRATORY_TRACT
  Administered 2015-09-04 (×2): 3 mL via RESPIRATORY_TRACT
  Filled 2015-09-02 (×10): qty 4

## 2015-09-02 NOTE — Significant Event (Addendum)
Called for hypoxia. 70 yo male hx afib on Eliquis + aspirin, HTN who presented to [facility] 2/25 with cervicothoracic epidural hematoma s/p C3-C5 and T3-T4 decompressive laminectomies 2/27 (Dr. Jaynie Collins), C6-C7 ACDF 3/9 (Dr. Jaynie Collins), C5-C7 anterior fusion and C5-T1 posterior fusion for C6-C7 fracture dislocation 3/18 (Dr. Claudette Laws), right leg DVT 3/19 s/p IVCF now with hypoxia. His nurse checked his Corpak this evening and didn't hear air in the stomach on auscultation after air injection, so ordered KUB which showed Corpak tip not in stomach. His Corpak was removed. He was hypoxic and placed on nonrebreather. pCXR showed small bilateral pleural effusions with atelectasis vs infiltrate. ABG was 7.37/56/122 on nonbreather.    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, eomi  Neck: +Aspen collar  Cardiovascular: regular rate and rhythm, no murmurs, rubs or gallops  Lungs: decreased breath sounds bilateral bases, without wheezing, rhonchi, or rales  Abdomen: soft, non-tender, non-distended; no palpable masses, no hepatosplenomegaly, normoactive bowel sounds, no rebound or guarding  Extremities: no clubbing, cyanosis, or edema  Neuro: cranial nerves grossly intact, strength 4-/5 bilateral arms, 0/5 bilateral legs, follows commands 100% of the time   Skin: no rashes or lesions noted    Assessment and Plan: 70 yo male hx afib on Eliquis + aspirin, HTN who presented to [facility] 2/25 with cervicothoracic epidural hematoma s/p C3-C5 and T3-T4 decompressive laminectomies 2/27 (Dr. Jaynie Collins), C6-C7 ACDF 3/9 (Dr. Jaynie Collins), C5-C7 anterior fusion and C5-T1 posterior fusion for C6-C7 fracture dislocation 3/18 (Dr. Claudette Laws), right leg DVT 3/19 s/p IVCF now with acute respiratory failure.  --Transferred to IMCU status for BiPAP  --Trialed BiPAP but patient was uncomfortable on BiPAP then refused BiPAP. He then refused  high-flow oxygen but was willing to use nasal cannula and nonrebreather. Later he was amenable to high-flow oxygen.  --Discussed with patient, continues to refuse BiPAP and is unsure about intubation. Please call his son if considering intubation. He appears frustrated at his functional decline.  --Pulmonary toilet  --Continue Zosyn  --Continue NIF/VC  --Consider CTA chest if continues hypoxia  --Reinsert Corpak when patient willing  --I was with this critically ill patient from 3/22 1:25-2:05

## 2015-09-02 NOTE — Progress Notes (Signed)
ID PROGRESS NOTE    Date Time: 09/02/2015 8:32 AM  Patient Name: [facility]        Subjective, ROS:    Tx to ICU with distress/respiratory failure  Was on BiPAP, then NRB, has been refusing and now on NC    Tmax 99.7    Stable labs     Sleepy but answered some questions   Mild distress   Denies pain   No vomiting  Foley replaced     No other issues per nursing      Antibiotics and culture results:   Antibiotics: zosyn #10.   Midodrine   S/P vanc       Cultures:  3/20 flu negative   3/18 cervical wound cx cut flora  3/18 cervical wound anaerobic cx ngtd  3/18 cervical wound AFB and fungal cx P  3/13 Ucx >100,000 E. Faecalis   3/13 Blood cx Ngtd  3/6 MRSA negative     3/9 path FIBROVASCULAR TISSUE, FEW VESSELS AND BONE CHIPS       Lines: PIV access    Physical Exam:     Filed Vitals:    09/02/15 0700   BP: 144/65   Pulse: 81   Temp:    Resp: 24   SpO2: 89%       General: in bed, in ICU, sleepy, mild distress.  HEENT: dry  mucosa , blind in one eye, not pale, not icteric , cervical collar in place, scar not examined   Chest: S1S2 irregular , few crackles bilaterally, coarse, no wheeze   Abdomen: soft, not tender , BS +  Ext: + edema   Weakness ++ arms and legs        Labs:       Recent CBC WITH DIFF   Recent Labs      09/02/15   0510   WBC  8.37   RBC  3.17*   HGB  10.0*   HEMATOCRIT  33.0*   MCV  104.1*       Recent CMP   Recent Labs      09/02/15   0510   GLUCOSE  121*   BUN  12.0   CREATININE  0.5*   SODIUM  147*   POTASSIUM  4.1   CHLORIDE  107   CO2  34*           Rads:   3/22 xray Newly appearing small bilateral pleural effusions with overlying consolidation    3/20 IRSuccessful deployment of a retrievable inferior vena cava filter    3/19 doppler Nonocclusive deep venous thrombosis involving a short segment of the proximal femoral vein.    3/17 CT  Markedly abnormal examination with anterolisthesis of C6 upon C7  measuring 16 mm with a jumped facet on the left and a comminuted displaced  fracture involving the right C7 articular facet. Anterior cervical spinal fusion hardware is malpositioned with both plate and screws extending into the C6-C7 disc space.  The AP dimension of the bony spinal canal at the superior C5 level is reduced to 5 mm.     3/17 xray No fracture acute osseous abnormality identified in the right shoulder, humerus or forearm    3/15 xray Minimal left basilar linear atelectasis or scarring.    3/8 MRI Postsurgical change with prior laminectomy. 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 There findings suspicious for a disc ligamentous injury at this level. Correlation with CT is advised  3/7 IR No angiographic evidence of intracranial aneurysm, arteriovenous malformation or dural fistula. Is no angiographic evidence of spinal arteriovenous malformation or dural fistula.  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   - worsening of weakness due to Fracture dislocation C6-C7 with malpositioned hardware  - S/P C5-T1 posterior fusion, C5-C7 anterior fusion with partial C7 corpectomy on 3/18. cx normal flora     Other issues during admission:  Fever/SIRS on 3/12   Symptoms of aspiration with increased cough and mild-mod oropharyngeal dysphagia   Urinary retention, required reinsertion of foley and E. Faecalis UTI  Leukocytosis  DVT (S/P IVC filter)    Hypoxic respiratory failure overnight       Plan:   1. Hypoxic respiratory failure overnight.  Due to  aspiration/malositioned Corpak (per xray not in the stomach).   Required biPAP and NRB but refusing.     Repeat xray with newly appearing small bilateral pleural effusions with overlying consolidation.  On zosyn  Obtain sputum cx.     Continue zosyn for now.  Pulmonary toilet   Aspiration precaution.     Overall worsening of dysphagia (now with severe oropharyngeal dysphagia)    2. SIRS.   Was due to UTI and possible aspiration.    3. E. Faecalis UTI.   Cath related   Foley was removed/then replaced x 2 given retention   Has finished the course.      4. 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   S/P C5-T1 posterior fusion, C5-C7 anterior fusion with partial C7 corpectomy on 3/18. cx normal flora     On zosyn   Will discuss with neurosurgery about need for antibiotics.     5. Leukocytosis.   Due to above.   Resolved.    Plan is palliative care consult as has been refusing treatment overnight.    Discussed with nursing.      Signed by: [place]. [id number]  Infectious Disease  Phone: [phone]  Fax: [phone]

## 2015-09-02 NOTE — SLP Progress Note (Signed)
[facility]   SLP Treatment Note  Patient: Ernest Diaz    MRN#: [id number]     Treatment Type: dysphagia f/u    Recommendations/Plan:   - Diet Recommendations: NPO (recommend alternative nutrition)     SLP Frequency Recommended: 3x per week    Discharge recommendations: Rehab consult    Assessment:   Pt with decline in alertness and swallow function this date. Opens eyes briefly to stimulation. Oral care provided. Pt independently excepts empty spoon x5. Oral acceptance of 1/3 tsp trials water via tsp adequate x2 this date. Minimal hyolaryngeal elevation appreciated with absent swallow in 2/2 opps. Continue to recommend NPO. Pt to benefit from alternative nutrition; may need to consider long-term options, depending on GOC. Limited trials only this date given absent swallow, worsening CXR, and hx of ineffective cough response. SLP to follow closely.     Subjective:   Pain: 0/10 faces   Current Diet: NPO   Respiratory Status: O2 via NC  Precautions:  Fall, aspiration, c-collar  Patient seen in bed. Pt describes continued difficulty swallowing. Aspen collar donned. Family at bedside. Patient left with call bell within reach, all needs met, SCDs in place, fall mat in place, bed alarm activated and all questions answered.   Pt's wife visibly upset regarding pt's decline. States that she is exhausted. Offered Chaplain services to pt/family. Family interested in visit from [employer]; notified RN.   Objective:   Objective:   Opens eyes briefly to MOD tactile stimulation. Oral care provided. Pt independently excepts empty spoon x5. Oral acceptance of 1/3 tsp trials water via tsp adequate x2 this date. Minimal hyolaryngeal elevation appreciated with absent swallow in 2/2 opps. Continue to recommend NPO. Pt to benefit from alternative nutrition. Limited trials only this date given absent swallow, worsening CXR, and hx of ineffective cough response. Family engaged in education regarding  recommendations.     Patient Education: Verbal education provided to both pt and family; pt expressed understanding and agreement    Goals:   Patient will tolerate sips of liquid/ice chips x10 without aspiration. ONGOING  Patient will complete pharyngeal exercises independently outside of tx. Ernest Nimrod, MS, CCC-SLP    Time of Treatment:  SLP Received On: 09/02/15  Start Time: 1105  Stop Time: 1127  Time Calculation (min): 22 min

## 2015-09-02 NOTE — Plan of Care (Signed)
Problem: Inadequate Gas Exchange  Goal: Adequately oxygenating and ventilation is improved  Outcome: Progressing  Sputum sample sent to lab today. Patient requires regular mouth care and suctioning. Mouth breather, prone to getting dried up sputum caked onto roof of mouth and back of throat. Tongue dry and cracked, frequent moist sponges swabbed in mouth. HOB 45*. NC 4L, sats high 90s-100%. Patient desats with lying flat/turning/cleaning

## 2015-09-02 NOTE — Plan of Care (Signed)
Problem: Neurological Deficit  Goal: Neurological status is stable or improving  Outcome: Not Progressing  Neuro exam stable, patient drowsy, minimally interactive, withdrawn, flat affect. Somewhat uncooperative at times. Moves BUEs antigravity. BLEs flicker to pain. Minimal fall risk due to paralysis and sleepiness. Requires Q2 turns

## 2015-09-02 NOTE — Plan of Care (Signed)
O2 sat dropped into 80's during abd CT at 2100, but have remained stable upon return to unit. C/o hunger, but refuses NG tube placement. Neuro exam unchanged.

## 2015-09-02 NOTE — Plan of Care (Signed)
Radiology called regarding Pt's CT abd/pelvis findings.  Ascites probably due to hypoalbuminemia, cannot r/o cholecystitis, recommending U/S RUQ, if nondiagnostic, then HIDA scan.

## 2015-09-02 NOTE — Progress Notes (Signed)
[facility] referrals updated by prior CM; [facility] confirms willingness to accept for short-term skilled nursing. Writer to continue to follow, transportation to be arranged when necessary.    Frederich Chick, LCSW  Case Management and Discharge Planning  [phone]

## 2015-09-02 NOTE — Progress Notes (Signed)
Pt refusing to wear Bipap mask. Dr. Cyndie Chime notified of situation and he came to talk to pt about his wishes. Pt agreed to wear NC 6L and is aware that O2 therapy may need to be elevated to [facility] to maintain Sp02 92% or greater.

## 2015-09-02 NOTE — Plan of Care (Addendum)
Patient with worsening respiratory status overnight and changed to [facility] status.  This AM, respiratory status appears improved and concern for aspiration event overnight.  Patient declined BiPAP overnight and currently receiving aggressive pulmonary toilet, nebs, and Mucomyst.  O2 sat currently in mid 90s on 6 L NC and patient already on Zosyn for aspiration PNA.  Discussed code status with patient and patient states in event of cardiac or respiratory arrest, he would not want CPR or intubation and mechanical ventilation however support is ok with BiPAP etc.  At time of discussion, patient is able to verbalize his wishes and understands consequences of medical decision.  Patient's son and POA Fara Olden called at [phone] and informed of patient's wishes and high risk of mucous plugging and risk of respiratory failure.  Patient's son reports he is sick with flu however will come in for goals of care meeting with his father to further discuss code status/artifical nutrition/goals of care.  Given patient's wishes, will change code status to DNR/DNI with support until family meeting.  Palliative care also consulted for further recommendations.  Both patient and son report patient has an advanced directive however cannot recall what it states. Son Fara Olden reports he will bring a copy to the hospital today.

## 2015-09-02 NOTE — Progress Notes (Signed)
Neurosurgery Progress Note  POD 4  S:       O:   Filed Vitals:    09/02/15 2000   BP: 124/59   Pulse: 80   Temp: 98.4 F (36.9 C)   Resp: 20   SpO2: 100%         PHYSICAL EXAM:  Awakes to voice  Verbalizing  Motor in arms antigravity        A/P:   Motor exam stable  Less alert presumably due to respiratory status  Had a long discussion regarding prognosis and issues related to DNR and possible DNI  All questions were answered  Appreciate supportive care      Rella Larve, MD

## 2015-09-02 NOTE — Consults (Signed)
[facility]  Asked by Dr. Wynetta Fines to see patient re: dysphagia, respiratory failure and goals of care.    Chart reviewed, patient seen and met with patient's wife, son and son's girlfriend, as patient requested to sleep, assented to my conversation with family including medical decision maker. I reviewed patients medical condition, hospital course and events of last 24 hours as well as patients wishes and preferences for care as discussed with other team, regarding no intubation, no CPR.    We also discussed next steps in plan of care and options re: PEG with risks/benefits, potential complications, status of discharge and needs through rehab process. Answered questions and support offered. I will continue to follow  And assist as events unfold.    D/W attending. FULL CONSULT DICTATED    Jearld Lesch. Pennelope Bracken, MD  [facility] Palliative Medicine  Team Pager [phone] available 24/7  Coordinator [employer] [phone] between 9-4:30pm M-F

## 2015-09-02 NOTE — Progress Notes (Addendum)
MEDICINE PROGRESS NOTE    Date Time: 09/02/2015 3:08 PM  Patient Name: Ernest Diaz  Attending Physician: Isaias Cowman, MD    Assessment:   Principal Problem:    Quadriplegia  Active Problems:    Atrial fibrillation    Shock    Cord compression    Acute respiratory failure    Aspiration pneumonia    Acute deep vein thrombosis (DVT) of femoral vein of right lower extremity    Atrial fibrillation, unspecified type  Resolved Problems:    Thoracic spinal AV fistula      Plan:   1. Traumatic cervicothoracic epidural hematoma s/p C3-C5 + T3-T4 decompressive laminectomies 2/27 by Dr. 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 Jaynie Collins on 08/20/15  [facility] course complicated by fracture dislocation C6-C7 with malpositioned hardware s/p C5-T1 posterior fusion, C5-7 anterior fusion with partial C7 corpectomy on 3/18 with NSGY  - Now requiring Midodrine 10 mg TID and Florinef 0.2 mg q day for BP support for autonomic dysfunction, MAP goal > 65  - Will need outpatient evaluation by NSGY prior to clearance for [facility] or antiplatelets   - C-collar at all times, fall precautions in place     2. Hx A Fib on Eliquis, CHADS2VASC of 2   - Rate controlled without agents, Atenolol discontinued given MAP goals > 65 and requiring Florinef and Midodrine  -Patient asymptomatic with appropriate chronotropic response  -Hold AC and antiplatelets given procedure at this time  -Discussed with patient, aware of increased risk of CVA without AC or antiplatelets however also understands contraindication from NSGY perspective given risk of bleeding into spine, will need clearance from Dr. Jaynie Collins prior to restarting as an outpatient     3. Urinary Retention  - Bladder scan without Foley > 300 cc, replaced Foley   - Likely due to  recent neurosurgical procedure  - Unable to have Flomax due to sulfa allergy, continue Cardura  - Renal US pending at this time     4. Respiratory Failure  - On Zosyn for 10 day course, end date 09/03/15  - Had previously been on Vancomycin however discontinued given Enterococcus sensitive to PCN  - Leukocytosis has resolved, ID following and appreciate recommendations  - Overnight, had hypoxic respiratory failure requiring non-rebreather, concerning for repeat aspiration event, O2 sat now stable on 4-5 L NC     5. Dysphagia- Likely due to recent cervical spine intervention  - NPO at this time   - SLP following  - May need PEG placement for long term nutrition, patient and family amenable to PEG   - Aspiration precautions in place  - Continue Mucomyst, Albuterol nebs, and IPV to minimize secretions and reduce risk of aspiration   - Hydrate with D5W-1/2 NS to avoid hypoglycemia, NG tube out at this time and patient requests to continue without NG tube   - Palliative care consulted for goals of care     6. RLE DVT- Unable to have AC due to recent surgery   - IVC filter placed by IR on 08/31/15  - Will need NSGY clearance prior to starting [facility] as an outpatient    DVT PPX: Lovenox (cleared for chemical ppx only by NSGY at this time)    Case discussed with: Patient,  RN, Family at bedside    Held family discussion with patient, son Fara Olden who is POA, and family members at bedside.  Patient and family state in event of cardiac or respiratory arrest code status is DNR/DNI however support is ok.  Patient and family counseled extensively regarding high risk of further aspiration events, mucous plugging, and possible cardiac/respiratory arrest.  Patient and family amenable to meeting with palliative care to discuss further goals of care however all amenable to PEG placement for long term nutrition at this time.  I spent 35 minutes between 10 AM-10:35 AM discussing the patient's condition and plan of care with him and his family at  bedside in addition to today's interview/examination.        Safety Checklist:     DVT prophylaxis:  CHEST guideline (See page [id number]) Chemical   Foley:  [place] Rn Foley protocol Present and continue: Indication: Urinary retention   IVs:  Peripheral IV   PT/OT: Ordered   Daily CBC & or Chem ordered:  SHM/ABIM guidelines (see #5) Yes, due to clinical and lab instability   Reference for approximate charges of common labs: CBC auto diff - $76  BMP - $99  Mg - $79    Lines:     Patient Lines/Drains/Airways Status    Active PICC Line / CVC Line / PIV Line / Drain / Airway / Intraosseous Line / Epidural Line / ART Line / Line / Wound / Pressure Ulcer / NG/OG Tube     Name:   Placement date:   Placement time:   Site:   Days:    CVC Triple Lumen 08/30/15 Left Subclavian  08/30/15   0040   Subclavian   2    Peripheral IV 08/27/15 Right Antecubital  08/27/15   1100   Antecubital   5    Peripheral IV 08/29/15 Left Hand  08/29/15   1345   Hand   3    Peripheral IV 08/30/15 Right Antecubital  08/30/15   0030   Antecubital   2    Urethral Catheter Non-latex 16 Fr.  09/01/15   1118   Non-latex   less than 1    Arterial Line Left               Wound 08/25/15 Pressure Injury Buttocks Left;Right Non-Blanchable Erythema  08/25/15   2258   Buttocks   6    Incision Site 08/20/15 Neck  08/20/15   1427     11    Incision Site 08/20/15 Neck  08/20/15   1932     11    Incision Site 08/28/15 Neck  08/28/15   2352     3    Incision Site 08/29/15 Neck  08/29/15   0515     3    Feeding Tube Nasogastric Left nare  08/28/15      Left nare   4                 Disposition: (Please see PAF column for Expected D/C Date)   Today's date: 09/02/2015  Admit Date: 08/17/2015  4:29 PM  LOS: 16  Clinical Milestones: Neuro evaluation   Anticipated discharge needs: Per PT/OT recs      Subjective     CC: Quadriplegia    Interval History/24 hour events: Overnight, had aspiration event and hypoxic respiratory failure (please refer to separate note by Dr. Cyndie Chime).   Patient this AM denies chest pain, shortness of breath, abdominal pain, nausea/vomiting,  diarrhea.  Reports in event of cardiac or respiratory arrest would not want CPR or intubation and mechanical ventilation.  Resting comfortably in bed.       Review of Systems:     Review of Systems - Negative except as noted above in HPI      Physical Exam:     VITAL SIGNS PHYSICAL EXAM   Temp:  [97.4 F (36.3 C)-99.7 F (37.6 C)] 97.5 F (36.4 C)  Heart Rate:  [72-88] 72  Resp Rate:  [16-31] 16  BP: (105-179)/(58-83) 105/62 mmHg  FiO2:  [60 %-100 %] 60 %      Intake/Output Summary (Last 24 hours) at 09/02/15 1508  Last data filed at 09/02/15 1219   Gross per 24 hour   Intake 1496.67 ml   Output    985 ml   Net 511.67 ml    Physical Exam  General: awake, alert X 3  Neck: Aspen collar in place  Cardiovascular: irregularly irregular, no murmurs, rubs or gallops  Lungs: coarse breath sounds bilaterally, no wheezing, no rales, mild ronchi  Abdomen: soft, non-tender, non-distended; no palpable masses, normoactive bowel sounds, no rebound, no guarding  Extremities: no edema  Neuro: CN grossly intact, speech fluent, follows commands, 4/5 b/l UE (except for grip 3/5 b/l in UE) and 0/5 b/l LE strength       Meds:     Medications were reviewed:    Labs:     Labs (last 72 hours):      Recent Labs  Lab 09/02/15  0510 09/01/15  0511   WBC 8.37 8.44   HGB 10.0* 10.1*   HEMATOCRIT 33.0* 32.9*   PLATELETS 200 205            Recent Labs  Lab 09/02/15  0510 09/01/15  0352   SODIUM 147* 142   POTASSIUM 4.1 4.2   CHLORIDE 107 105   CO2 34* 29   BUN 12.0 9.0   CREATININE 0.5* 0.5*   CALCIUM 8.3 8.3   GLUCOSE 121* 139*                   Microbiology, reviewed and are significant for:  Microbiology Results     Procedure Component Value Units Date/Time    AFB culture and smear [[id number]] Collected:  08/29/15 0200    Specimen Information:  Sputum from Wound Updated:  08/29/15 1142    Narrative:      ORDER#: [id number]                                     ORDERED BY: Nicoletta Dress  SOURCE: Wound POSTERIOR CERVICAL WOUND               COLLECTED:  08/29/15 02:00  ANTIBIOTICS AT COLL.:                                RECEIVED :  08/29/15 05:54  Stain, Acid Fast                           FINAL       08/29/15 11:42  08/29/15   No Acid Fast Bacillus Seen  Culture Acid Fast Bacillus (AFB)           PENDING      Anaerobic culture [[id number]] Collected:  08/29/15 0200    Specimen Information:  Other from Wound Updated:  08/31/15 1035    Narrative:      ORDER#: [id number]                                    ORDERED BY: Nicoletta Dress  SOURCE: Wound POSTERIOR CERVICAL WOUND               COLLECTED:  08/29/15 02:00  ANTIBIOTICS AT COLL.:                                RECEIVED :  08/29/15 05:54  Culture, Anaerobic Bacteria                PRELIM      08/31/15 10:35  08/31/15   No growth to date, final report to follow      CULTURE BLOOD AEROBIC AND ANAEROBIC [[id number]] Collected:  08/24/15 0909    Specimen Information:  Blood, Venipuncture Updated:  08/29/15 1521    Narrative:      ORDER#: [id number]                                    ORDERED BY: Wynetta Fines, VIN  SOURCE: Blood, Venipuncture peripheral               COLLECTED:  08/24/15 09:09  ANTIBIOTICS AT COLL.:                                RECEIVED :  08/24/15 13:17  Culture Blood Aerobic and Anaerobic        FINAL       08/29/15 15:21  08/29/15   No growth after 5 days of incubation.      Fungus culture [[id number]] Collected:  08/29/15 0200    Specimen Information:  Other from Wound Updated:  08/29/15 1201    Narrative:      ORDER#: [id number]                                    ORDERED BY: Nicoletta Dress  SOURCE: Wound POSTERIOR CERVICAL WOUND               COLLECTED:  08/29/15 02:00  ANTIBIOTICS AT COLL.:                                RECEIVED :  08/29/15 05:54  Stain, Fungal                              FINAL       08/29/15 12:01  08/29/15   No Fungal or Yeast Elements Seen  Culture Fungus                              PENDING      MRSA culture [[id number]] Collected:  08/17/15 2043    Specimen Information:  Body Fluid from Nasal/Throat ASC Admission Updated:  08/19/15 4403    Narrative:  ORDER#: [id number]                                    ORDERED BY: Paulita Fujita  SOURCE: Nares and Throat                             COLLECTED:  08/17/15 20:43  ANTIBIOTICS AT COLL.:                                RECEIVED :  08/18/15 03:03  Culture MRSA Surveillance                  FINAL       08/19/15 03:28  08/19/15   Negative for Methicillin Resistant Staph aureus from Nares and             Negative for Methicillin Resistant Staph aureus from Throat      Rapid influenza A/B antigens [[id number]] Collected:  08/31/15 1649    Specimen Information:  Nasopharyngeal from Nasal Aspirate Updated:  08/31/15 1727    Narrative:      ORDER#: [id number]                                    ORDERED BY: Sterling Big  SOURCE: Nasal Aspirate                               COLLECTED:  08/31/15 16:49  ANTIBIOTICS AT COLL.:                                RECEIVED :  08/31/15 17:04  Influenza Rapid Antigen A&B                FINAL       08/31/15 17:27  08/31/15   Negative for Influenza A and B             Reference Range: Negative      Urine culture [[id number]] Collected:  08/24/15 1718    Specimen Information:  Urine from Urine, Catheterized, Foley Updated:  08/26/15 1544    Narrative:      ORDER#: [id number]                                    ORDERED BY: Alysia Penna  SOURCE: Urine, Catheterized, Foley                   COLLECTED:  08/24/15 17:18  ANTIBIOTICS AT COLL.:                                RECEIVED :  08/24/15 23:42  Culture Urine                              FINAL       08/26/15 15:43   +  08/26/15   >100,000 CFU/ML Enterococcus faecalis      _____________________________________________________________________________  E.faecalis     ANTIBIOTICS                     MIC  INTRP       _____________________________________________________________________________  Ampicillin                       1     S        Gentamicin High Level [place] <=500   S        Levofloxacin                    <=1    S        Nitrofurantoin                 <=16    S  D1    Penicillin                       4     S        Streptomycin High Level Resist<=1000   S        Tetracycline                    >8     R        Vancomycin                       2     S          -----DRUG COMMENTS----------    D1:  Nitrofurantoin should only be used for the treatment of         uncomplicated cystitis.         [facility] [employer] June 2015  _____________________________________________________________________________            S=SUSCEPTIBLE     I=INTERMEDIATE     R=RESISTANT                            N/S=NON-SUSCEPTIBLE  _____________________________________________________________________________      Wound culture & gram stain [[id number]] Collected:  08/29/15 0200    Specimen Information:  Wound from Wound Updated:  08/31/15 1209    Narrative:      ORDER#: [id number]                                    ORDERED BY: Nicoletta Dress  SOURCE: Wound POSTERIOR CERVICAL WOUND               COLLECTED:  08/29/15 02:00  ANTIBIOTICS AT COLL.:                                RECEIVED :  08/29/15 05:54  Stain, Gram                                FINAL       08/29/15 08:45  08/29/15   No Squamous epithelial cells seen             Rare WBCs             No organisms seen  Culture and Gram Stain, Aerobic, Wound     FINAL  08/31/15 12:09   +  08/31/15   Very light growth of mixed cutaneous flora              Imaging, reviewed and are significant for:  Xr Chest Ap Portable    09/02/2015   Newly appearing small bilateral pleural effusions with overlying consolidation. Mills Koller, MD 09/02/2015 7:08 AM     Xr Abdomen Portable    09/01/2015   1. No enteric tube is identified. Finding was discussed with Ermalene Postin, RN, at  10:45 PM on 09/01/2015 via telephone. 2. Trace bilateral pleural effusions. Anne Hahn, MD 09/01/2015 10:49 PM           Signed by: Isaias Cowman, MD

## 2015-09-02 NOTE — Plan of Care (Signed)
Problem: Non-Violent Restraint interdisciplinary plan  Goal: Will be injury free during the use of non-violent restraints  Outcome: Completed Date Met:  09/02/15  Patient able to follow directions and avoid pulling lines. Restraints removed; reiterated criteria for keeping them off. Will continue to monitor safety.

## 2015-09-02 NOTE — Plan of Care (Signed)
Patient transferred to NSICU at 0100 for aspiration PNA. Pt placed on BiPAP 0200, but became agitated and refused to wear mask. Pt now on 6L nasal cannula and tolerating. Continuing to monitor O2 saturation. Neuro exam unchanged.

## 2015-09-03 ENCOUNTER — Inpatient Hospital Stay: Payer: Medicare Other

## 2015-09-03 LAB — CBC AND DIFFERENTIAL
Basophils Absolute Automated: 0.01 10*3/uL (ref 0.00–0.20)
Basophils Automated: 0 %
Eosinophils Absolute Automated: 0.01 10*3/uL (ref 0.00–0.70)
Eosinophils Automated: 0 %
Hematocrit: 34 % — ABNORMAL LOW (ref 42.0–52.0)
Hgb: 10.1 g/dL — ABNORMAL LOW (ref 13.0–17.0)
Immature Granulocytes Absolute: 0.02 10*3/uL
Immature Granulocytes: 0 %
Lymphocytes Absolute Automated: 0.54 10*3/uL (ref 0.50–4.40)
Lymphocytes Automated: 6 %
MCH: 32 pg (ref 28.0–32.0)
MCHC: 29.7 g/dL — ABNORMAL LOW (ref 32.0–36.0)
MCV: 107.6 fL — ABNORMAL HIGH (ref 80.0–100.0)
MPV: 10.6 fL (ref 9.4–12.3)
Monocytes Absolute Automated: 0.64 10*3/uL (ref 0.00–1.20)
Monocytes: 7 %
Neutrophils Absolute: 7.41 10*3/uL (ref 1.80–8.10)
Neutrophils: 86 %
Nucleated RBC: 0 /100 WBC (ref 0–1)
Platelets: 222 10*3/uL (ref 140–400)
RBC: 3.16 10*6/uL — ABNORMAL LOW (ref 4.70–6.00)
RDW: 14 % (ref 12–15)
WBC: 8.63 10*3/uL (ref 3.50–10.80)

## 2015-09-03 LAB — BASIC METABOLIC PANEL
BUN: 12 mg/dL (ref 9.0–28.0)
CO2: 36 mEq/L — ABNORMAL HIGH (ref 22–29)
Calcium: 8.5 mg/dL (ref 7.9–10.2)
Chloride: 107 mEq/L (ref 100–111)
Creatinine: 0.5 mg/dL — ABNORMAL LOW (ref 0.7–1.3)
Glucose: 135 mg/dL — ABNORMAL HIGH (ref 70–100)
Potassium: 4.6 mEq/L (ref 3.5–5.1)
Sodium: 149 mEq/L — ABNORMAL HIGH (ref 136–145)

## 2015-09-03 LAB — GLUCOSE WHOLE BLOOD - POCT
Whole Blood Glucose POCT: 148 mg/dL — ABNORMAL HIGH (ref 70–100)
Whole Blood Glucose POCT: 139 mg/dL — ABNORMAL HIGH (ref 70–100)
Whole Blood Glucose POCT: 139 mg/dL — ABNORMAL HIGH (ref 70–100)

## 2015-09-03 LAB — HEPATIC FUNCTION PANEL
ALT: 14 U/L (ref 0–55)
AST (SGOT): 18 U/L (ref 5–34)
Albumin/Globulin Ratio: 0.7 — ABNORMAL LOW (ref 0.9–2.2)
Albumin: 2.3 g/dL — ABNORMAL LOW (ref 3.5–5.0)
Alkaline Phosphatase: 78 U/L (ref 38–106)
Bilirubin Direct: 0.3 mg/dL (ref 0.0–0.5)
Bilirubin Indirect: 0.3 mg/dL (ref 0.0–1.1)
Bilirubin, Total: 0.6 mg/dL (ref 0.2–1.2)
Globulin: 3.2 g/dL (ref 2.0–3.6)
Protein, Total: 5.5 g/dL — ABNORMAL LOW (ref 6.0–8.3)

## 2015-09-03 LAB — GFR: EGFR: >60

## 2015-09-03 MED ORDER — DEXTROSE 5 % IV SOLN
INTRAVENOUS | Status: DC
Start: 2015-09-03 — End: 2015-09-04

## 2015-09-03 NOTE — Progress Notes (Signed)
ID PROGRESS NOTE    Date Time: 09/03/2015 8:58 AM  Patient Name: Ernest Diaz        Subjective, ROS:    Afebrile, hemodynamically stable    On NC    Hypernatremia     Cousin at bedside     Sleepy  Feels SOB  No vomiting   No other new issues        Antibiotics and culture results:   Antibiotics: zosyn #11  Midodrine   S/P vanc       Cultures:  3/22 sputum cx mixed flora   3/20 flu negative   3/18 cervical wound cx cut flora  3/18 cervical wound anaerobic cx ngtd  3/18 cervical wound AFB and fungal cx P  3/13 Ucx >100,000 E. Faecalis   3/13 Blood cx Ngtd  3/6 MRSA negative     3/9 path FIBROVASCULAR TISSUE, FEW VESSELS AND BONE CHIPS       Lines: PIV access    Physical Exam:     Filed Vitals:    09/03/15 0800   BP: 130/67   Pulse: 82   Temp: 97.7 F (36.5 C)   Resp: 28   SpO2: 100%       General: in bed, in ICU, sleepy, mild distress. On NC. Cousin at bedside   HEENT: dry  mucosa , blind in one eye,  pale, not icteric , cervical collar in place, scar not examined   Chest: S1S2 irregular , crackles bilaterally, coarse, no wheeze   Abdomen: soft, not tender , BS +  Ext: + edema   Weakness  legs >arms       Labs:       Recent CBC WITH DIFF   Recent Labs      09/03/15   0520   WBC  8.63   RBC  3.16*   HGB  10.1*   HEMATOCRIT  34.0*   MCV  107.6*       Recent CMP   Recent Labs      09/03/15   0520   GLUCOSE  135*   BUN  12.0   CREATININE  0.5*   SODIUM  149*   POTASSIUM  4.6   CHLORIDE  107   CO2  36*           Rads:   3/22 CT  Decompressed stomach located within the anterior abdomen extending  below the left hepatic lobe margin and above the transverse colon.. Nonspecific gallbladder wall thickening with pericholecystic fluid and stranding in the presence of small ascites. Recommend clinical correlation for acute cholecystitis.. Bilateral small effusions and basilar lower lobe consolidative atelectasis. Cardiac enlargement.    3/22 xray Newly appearing small bilateral pleural effusions with  overlying consolidation    3/20 IRSuccessful deployment of a retrievable inferior vena cava filter    3/19 doppler Nonocclusive deep venous thrombosis involving a short segment of the proximal femoral vein.    3/17 CT  Markedly abnormal examination with anterolisthesis of C6 upon C7  measuring 16 mm with a jumped facet on the left and a comminuted displaced fracture involving the right C7 articular facet. Anterior cervical spinal fusion hardware is malpositioned with both plate and screws extending into the C6-C7 disc space.  The AP dimension of the bony spinal canal at the superior C5 level is reduced to 5 mm.     3/17 xray No fracture acute osseous abnormality identified in the right shoulder, humerus or forearm    3/15  xray Minimal left basilar linear atelectasis or scarring.    3/8 MRI Postsurgical change with prior laminectomy. 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 There findings suspicious for a disc ligamentous injury at this level. Correlation with CT is advised    3/7 IR No angiographic evidence of intracranial aneurysm, arteriovenous malformation or dural fistula. Is no angiographic evidence of spinal arteriovenous malformation or dural fistula.  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   - worsening of weakness due to Fracture dislocation C6-C7 with malpositioned hardware  - S/P C5-T1 posterior fusion, C5-C7 anterior fusion with partial C7 corpectomy on 3/18. cx normal flora      Other issues during admission:  Fever/SIRS on 3/12   Symptoms of aspiration with increased cough and mild-mod oropharyngeal dysphagia   Urinary retention, required reinsertion of foley and E. Faecalis UTI  Leukocytosis  DVT (S/P IVC filter)    Hypoxic respiratory failure overnight 3/21-3/22       Plan:   1. SIRS.   Due to UTI and aspiration pneumonia    2. Hypoxic respiratory failure.  Due to aspiration/malositioned [place] (per xray not in the stomach).   Required BiPAP and NRB but then was refusing and placed on NC.     CT with Bilateral small effusions and basilar lower lobe consolidative atelectasis.  Sputum cx pending    Continue zosyn for now.  Pulmonary toilet   Aspiration precaution.     Overall worsening of dysphagia (now with severe oropharyngeal dysphagia)  Plan is discussion about feeding tube today     3. E. Faecalis UTI.   Cath related   Foley was removed/then replaced x 2 given retention   Has finished the course.      4. 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   S/P C5-T1 posterior fusion, C5-C7 anterior fusion with partial C7 corpectomy on 3/18. cx normal flora       5. Leukocytosis.   Due to above.   Resolved.    6. Goals of care.   Palliative care on board     Discussed with patient and family  Discussed with palliative care team .      Signed by: [place]. [id number]  Infectious Disease  Phone: [phone]  Fax: [phone]

## 2015-09-03 NOTE — Consults (Signed)
Service Date: 09/02/2015     Patient Type: I     CONSULTING PHYSICIAN: Jerrell Belfast MD     REFERRING PHYSICIAN:      ATTENDING PHYSICIAN:  Dr. Wynetta Fines.     CONSULTING SERVICE:  [facility] Palliative Medicine.     IMPRESSION:  A 70 year old gentleman with a fall, status post 3 neurosurgical  interventions to cervical and thoracic spine, currently paralyzed with  dysphagia and aspiration/possible mucus plugging and respiratory failure  overnight.     REASON FOR CONSULTATION:  1.  Dyspnea.  2.  Dysphagia.  3.  Goals of care.     HISTORY OF PRESENT ILLNESS:  This is a 70 year old gentleman who was transferred from [facility]  after a fall sustaining spine injuries requiring neurosurgical  intervention.  He initially underwent urgent surgery on August 10, 2015,  where he ultimately had C3-5 and T3-4 laminectomies.  During the operative  intervention was found to have an extensive epidural venous plexus with  significant bleeding.  He therefore was transferred to [facility] on August 17, 2015, and Dr. Jaynie Collins of neurosurgery has been following.   In addition to his neurologic surgery issues, he has also had atrial  fibrillation for which anticoagulation has been held, hypertension, urinary  retention with Foley catheter placed, and he has been virtually  quadriplegic.  On August 20, 2015, he underwent a second surgery for  decompressive anterior, posterior cervical fusion and resection of the  dural AV fistula.  Several days later, he did develop a fever and was found  to have a UTI and ultimately bacteremia, for which he has been on  antimicrobials.  On August 30, 2015, he went back to the OR for a C6-7  hardware revision and fixation.  Postoperatively, he was extubated and  briefly experienced transient hypotension.  He was in the ICU.  He also  developed worsening mental status and agitation but ultimately improved.   He also was found to have a nonocclusive thrombus in the right femoral vein  for  which he has received an IVC filter, and subsequently overnight last  night he developed progressive respiratory failure, hypoxic and hypercarbic  respiratory failure.  BiPAP was initially tried.  The patient ultimately  refused, and further discussions with the patient and repeated discussions  with the patient have resulted in a DNR, do not intubate order.  Overnight  he has been weaned down to 6 liters nasal cannula.     At my visit, the patient is seen.  He does have a wife, a son, and his  son's girlfriend at the bedside.  Wife is local and does have mild to  moderate dementia.  The patient was his wife's caregiver, and the son is  from [place] who has been caring for the wife since the patient was  first hospitalized.  The patient is very drowsy.  He has had multiple  conversations over the course of the day and is not open to discussion at  the present time, though he is accepting my conversation with his family on  his behalf at the present time.  He denies pain or shortness of breath and  is not open to an extensive review of systems but requests to be allowed to  sleep.     PAST MEDICAL HISTORY:  Significant for atrial fibrillation on anticoagulation, hypertension,  pericarditis, IVC filter, colonoscopy, eye surgery, multiple neurosurgeries  as described.     CURRENT MEDICATIONS:  Reviewed in Epic.     ALLERGIES:  SULFA ANTIBIOTICS, which cause lip swelling.     SOCIAL HISTORY:  He is married.  His wife, Fulton Mole, does have a mild to moderate dementia and  needs significant assistance with IADLs, is independent of ADLs.  Son, Juandaniel Manfredo, is his medical POA.  There are advanced directives, and they  are not currently in the chart.  The family did bring them in.  He is  currently do not resuscitate, do not intubate, but support okay.  He is  Catholic and this is important to him.  He has been seen by the priest.     FAMILY HISTORY:  Significant for no prior stroke or intracranial hemorrhage.  He  does have a  son who is alive and well.     PHYSICAL EXAMINATION:  VITAL SIGNS:  The patient is afebrile, temperature is 98.4, pulse rate 82,  blood pressure is 124/66, respiratory rate is 24 with a pulse oximetry of  97% on room air.  GENERAL:  He is fatigued, sitting up in bed.  He does have a hard collar in  place.  HEENT:  Eyes are closed.  When open, sclerae are anicteric.  Oral mucous  membranes are moist.  Oropharynx not well visualized.  CHEST:  Clear to auscultation anteriorly, diminished breath sounds at the  bases.  No rhonchi or wheeze.  CARDIOVASCULAR:  Irregularly irregular.  Normal S1, S2 without gallop or  murmur.  ABDOMEN:  Soft, nontender, no organomegaly or mass.  EXTREMITIES:  No clubbing, cyanosis, or edema.  SKIN:  Warm and dry without rash or ecchymoses.  NEUROLOGIC:  The patient is 0/5 strength in bilateral lower extremities.     LABORATORY AND RADIOGRAPHIC DATA:  Reviewed in Epic.     IMPRESSION AND RECOMMENDATIONS:     RECOMMENDATIONS:  1.  Dyspnea.  At the present time, the patient is stable on 6 liters nasal  cannula.  I am unable to have further conversation with him at this time  but would suspect BiPAP would be relatively contraindicated based upon  dysphagia, aspiration pneumonia, and the patient's intolerance to it over  the last 24 hours.  I would like to have further conversation with the  patient when he is able to discuss his wishes and understanding of  alternative options and likely outcomes.  I would avoid opiates at this  time, and there is certainly no sign of distress.  2.  Dysphagia at the present time.  Unfortunately, the patient hard  cervical collar is impeding his improvement and may ultimately require PEG  placement.  Reviewed with the family the possibility of aspiration even  with PEG placement or aspiration of oropharyngeal content and mucus from  oral and nasopharynx for possible recurrent pneumonia, depending upon his  ability to protect his airway.  He is  currently on antibiotics and will  continue to support his respiratory status.  I discussed the possibility of  worsening and recommendation against intubation if there was subsequent  further decline.     GOALS OF CARE:  I spoke with the family regarding the patient's overall hospital course and  factors both in the patient's favor and those that add to challenges for  his recovery.  The challenges certainly including his prolonged hospital  stay and recurrent surgical intervention as well as current risk of  aspiration and poor swallow.  I did discuss the possibility of worsening  infections and decline.  Certainly we  are working hard towards recovery and  improvement at every possible step.  Family is very supportive, worried  about him and the patient's wife, and looking at different opportunities  for rehabilitation and the possibility of moving him to [place] to  be closer to the son, which would be easier for the son to help also care  for the patient's wife.  I have discussed continued hope for improvement in  the coming days and ultimately weeks but that if he is not improving and  develops recurrent infections, this will further delay and therefore  diminished patient's likelihood of long-term improvement.     Discussed palliative care, supportive services, and we will continue to  follow along with the family throughout their stay in the hospital.  We  will also plan to speak with Dr. Jaynie Collins regarding the specifics of  prognostication and recovery.     Thank you very much for this consultation.  We will continue to follow  along with you.  Do not hesitate to call with questions, [phone].   Counseling and coordination of care greater than 50%.  Total time is 80  minutes.           D:  09/03/2015 13:17 PM by Dr. Jearld Lesch. Pennelope Bracken, MD ([id number])  T:  09/03/2015 13:45 PM by NTS      Everlean Cherry: [id number]) (Doc ID: [id number])

## 2015-09-03 NOTE — Progress Notes (Signed)
MEDICINE PROGRESS NOTE    Date Time: 09/03/2015 4:54 PM  Patient Name: Ernest Diaz  Attending Physician: Isaias Cowman, MD    Assessment:   Principal Problem:    Quadriplegia  Active Problems:    Atrial fibrillation    Shock    Cord compression    Acute respiratory failure    Aspiration pneumonia    Acute deep vein thrombosis (DVT) of femoral vein of right lower extremity    Atrial fibrillation, unspecified type  Resolved Problems:    Thoracic spinal AV fistula      Plan:   1. Traumatic cervicothoracic epidural hematoma s/p C3-C5 + T3-T4 decompressive laminectomies 2/27 by Dr. 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 Jaynie Collins on 08/20/15  [facility] course complicated by fracture dislocation C6-C7 with malpositioned hardware s/p C5-T1 posterior fusion, C5-7 anterior fusion with partial C7 corpectomy on 3/18 with NSGY  - Now requiring Midodrine 10 mg TID and Florinef 0.2 mg q day for BP support for autonomic dysfunction, MAP goal > 65  - Will need outpatient evaluation by NSGY prior to clearance for [facility] or antiplatelets   - C-collar at all times, fall precautions in place     2. Hx A Fib on Eliquis, CHADS2VASC of 2   - Rate controlled without agents, Atenolol discontinued given MAP goals > 65 and requiring Florinef and Midodrine  -Patient asymptomatic with appropriate chronotropic response  -Hold AC and antiplatelets given procedure at this time  -Discussed with patient, aware of increased risk of CVA without AC or antiplatelets however also understands contraindication from NSGY perspective given risk of bleeding into spine, will need clearance from Dr. Jaynie Collins prior to restarting as an outpatient     3. Urinary Retention  - Bladder scan without Foley > 300 cc, replaced Foley   - Likely due to  recent neurosurgical procedure  - Unable to have Flomax due to sulfa allergy, continue Cardura  - Patient declined Renal US     4. Respiratory Failure  - On Zosyn for 10 day course, end date 09/03/15  - Had previously been on Vancomycin however discontinued given Enterococcus sensitive to PCN  - Leukocytosis has resolved, ID following and appreciate recommendations  - Now stable on 4-5 L NC, continue IPV, albuterol nebs, Mucomyst, aggressive pulm toilet   - Code status DNR/DNI, would be poor candidate for BiPAP given high aspiration risk     5. Dysphagia- Likely due to recent cervical spine intervention  - NPO at this time   - IR consulted for PEG placement, plan for procedure 3/24    6. RLE DVT- Unable to have AC due to recent surgery   - IVC filter placed by IR on 08/31/15  - Will need NSGY clearance prior to starting [facility] as an outpatient    7. Gallbladder Wall Thickening- Incidentally noted on CT A/P obtained for procedure planning  - RUQ [place] does not show definitive evidence of cholecystitis  - Patient asymptomatic, without fevers/leukocytosis, LFTs without significant acute abnormality   - Will obtain HIDA scan if patient becomes symptomatic or develops abnormalities on labwork    8. Hypernatremia- Likely due to decreased free water intake, BUN/Cr > 20  - Hydrate with D5W  - Trend Na q day  DVT PPX: Lovenox (cleared for chemical ppx only by NSGY at this time)    Case discussed with: Patient, RN, Family at bedside      Safety Checklist:     DVT prophylaxis:  CHEST guideline (See page [id number]) Chemical   Foley:  [place] Rn Foley protocol Present and continue: Indication: Urinary retention   IVs:  Peripheral IV   PT/OT: Ordered   Daily CBC & or Chem ordered:  SHM/ABIM guidelines (see #5) Yes, due to clinical and lab instability   Reference for approximate charges of common labs: CBC auto diff - $76  BMP - $99  Mg - $79    Lines:     Patient Lines/Drains/Airways Status    Active PICC Line / CVC Line / PIV Line /  Drain / Airway / Intraosseous Line / Epidural Line / ART Line / Line / Wound / Pressure Ulcer / NG/OG Tube     Name:   Placement date:   Placement time:   Site:   Days:    CVC Triple Lumen 08/30/15 Left Subclavian  08/30/15   0040   Subclavian   2    Peripheral IV 08/27/15 Right Antecubital  08/27/15   1100   Antecubital   5    Peripheral IV 08/29/15 Left Hand  08/29/15   1345   Hand   3    Peripheral IV 08/30/15 Right Antecubital  08/30/15   0030   Antecubital   2    Urethral Catheter Non-latex 16 Fr.  09/01/15   1118   Non-latex   less than 1    Arterial Line Left               Wound 08/25/15 Pressure Injury Buttocks Left;Right Non-Blanchable Erythema  08/25/15   2258   Buttocks   6    Incision Site 08/20/15 Neck  08/20/15   1427     11    Incision Site 08/20/15 Neck  08/20/15   1932     11    Incision Site 08/28/15 Neck  08/28/15   2352     3    Incision Site 08/29/15 Neck  08/29/15   0515     3    Feeding Tube Nasogastric Left nare  08/28/15      Left nare   4                 Disposition: (Please see PAF column for Expected D/C Date)   Today's date: 09/03/2015  Admit Date: 08/17/2015  4:29 PM  LOS: 17  Clinical Milestones: Neuro evaluation   Anticipated discharge needs: Per PT/OT recs      Subjective     CC: Quadriplegia    Interval History/24 hour events: No acute events overnight.  Patient denies chest pain, abdominal pain, shortness of breath, nausea/vomiting, fevers, chills.  States he is otherwise doing ok.  Amenable to PEG placement today.  Plan of care discussed with family at bedside.         Review of Systems:     Review of Systems - Negative except as noted above in HPI      Physical Exam:     VITAL SIGNS PHYSICAL EXAM   Temp:  [96.7 F (35.9 C)-98.4 F (36.9 C)] 97.5 F (36.4 C)  Heart Rate:  [74-85] 85  Resp Rate:  [16-28] 16  BP: (107-139)/(57-73) 107/65 mmHg      Intake/Output Summary (Last 24 hours) at 09/03/15 1654  Last  data filed at 09/03/15 1600   Gross per 24 hour   Intake 2398.33 ml    Output    925 ml   Net 1473.33 ml    Physical Exam  General: awake, alert X 3  Neck: Aspen collar in place  Cardiovascular: irregularly irregular, no murmurs, rubs or gallops  Lungs: coarse breath sounds bilaterally, no wheezing, no rales, mild ronchi  Abdomen: soft, non-tender, non-distended; no palpable masses, normoactive bowel sounds, no rebound, no guarding  Extremities: no edema  Neuro: CN grossly intact, speech fluent, follows commands, 4/5 b/l UE (except for grip 3/5 b/l in UE) and 0/5 b/l LE strength       Meds:     Medications were reviewed:    Labs:     Labs (last 72 hours):      Recent Labs  Lab 09/03/15  0520 09/02/15  0510   WBC 8.63 8.37   HGB 10.1* 10.0*   HEMATOCRIT 34.0* 33.0*   PLATELETS 222 200            Recent Labs  Lab 09/03/15  0520 09/02/15  0510   SODIUM 149* 147*   POTASSIUM 4.6 4.1   CHLORIDE 107 107   CO2 36* 34*   BUN 12.0 12.0   CREATININE 0.5* 0.5*   CALCIUM 8.5 8.3   ALBUMIN 2.3*  --    PROTEIN, TOTAL 5.5*  --    BILIRUBIN, TOTAL 0.6  --    ALKALINE PHOSPHATASE 78  --    ALT 14  --    AST (SGOT) 18  --    GLUCOSE 135* 121*                   Microbiology, reviewed and are significant for:  Microbiology Results     Procedure Component Value Units Date/Time    AFB culture and smear [[id number]] Collected:  08/29/15 0200    Specimen Information:  Sputum from Wound Updated:  08/29/15 1142    Narrative:      ORDER#: [id number]                                    ORDERED BY: Nicoletta Dress  SOURCE: Wound POSTERIOR CERVICAL WOUND               COLLECTED:  08/29/15 02:00  ANTIBIOTICS AT COLL.:                                RECEIVED :  08/29/15 05:54  Stain, Acid Fast                           FINAL       08/29/15 11:42  08/29/15   No Acid Fast Bacillus Seen  Culture Acid Fast Bacillus (AFB)           PENDING      Anaerobic culture [[id number]] Collected:  08/29/15 0200    Specimen Information:  Other from Wound Updated:  08/31/15 1035    Narrative:      ORDER#: [id number]                                     ORDERED BY: Nicoletta Dress  SOURCE: Wound POSTERIOR CERVICAL  WOUND               COLLECTED:  08/29/15 02:00  ANTIBIOTICS AT COLL.:                                RECEIVED :  08/29/15 05:54  Culture, Anaerobic Bacteria                PRELIM      08/31/15 10:35  08/31/15   No growth to date, final report to follow      CULTURE BLOOD AEROBIC AND ANAEROBIC [[id number]] Collected:  08/24/15 0909    Specimen Information:  Blood, Venipuncture Updated:  08/29/15 1521    Narrative:      ORDER#: [id number]                                    ORDERED BY: Wynetta Fines, VIN  SOURCE: Blood, Venipuncture peripheral               COLLECTED:  08/24/15 09:09  ANTIBIOTICS AT COLL.:                                RECEIVED :  08/24/15 13:17  Culture Blood Aerobic and Anaerobic        FINAL       08/29/15 15:21  08/29/15   No growth after 5 days of incubation.      Fungus culture [[id number]] Collected:  08/29/15 0200    Specimen Information:  Other from Wound Updated:  08/29/15 1201    Narrative:      ORDER#: [id number]                                    ORDERED BY: Nicoletta Dress  SOURCE: Wound POSTERIOR CERVICAL WOUND               COLLECTED:  08/29/15 02:00  ANTIBIOTICS AT COLL.:                                RECEIVED :  08/29/15 05:54  Stain, Fungal                              FINAL       08/29/15 12:01  08/29/15   No Fungal or Yeast Elements Seen  Culture Fungus                             PENDING      MRSA culture [[id number]] Collected:  08/17/15 2043    Specimen Information:  Body Fluid from Nasal/Throat ASC Admission Updated:  08/19/15 0328    Narrative:      ORDER#: [id number]                                    ORDERED BY: Paulita Fujita  SOURCE: Nares and Throat  COLLECTED:  08/17/15 20:43  ANTIBIOTICS AT COLL.:                                RECEIVED :  08/18/15 03:03  Culture MRSA Surveillance                  FINAL       08/19/15 03:28  08/19/15   Negative for Methicillin Resistant Staph  aureus from Nares and             Negative for Methicillin Resistant Staph aureus from Throat      Rapid influenza A/B antigens [[id number]] Collected:  08/31/15 1649    Specimen Information:  Nasopharyngeal from Nasal Aspirate Updated:  08/31/15 1727    Narrative:      ORDER#: [id number]                                    ORDERED BY: Irving Shows, EMIL  SOURCE: Nasal Aspirate                               COLLECTED:  08/31/15 16:49  ANTIBIOTICS AT COLL.:                                RECEIVED :  08/31/15 17:04  Influenza Rapid Antigen A&B                FINAL       08/31/15 17:27  08/31/15   Negative for Influenza A and B             Reference Range: Negative      Urine culture [[id number]] Collected:  08/24/15 1718    Specimen Information:  Urine from Urine, Catheterized, Foley Updated:  08/26/15 1544    Narrative:      ORDER#: [id number]                                    ORDERED BY: Alysia Penna  SOURCE: Urine, Catheterized, Foley                   COLLECTED:  08/24/15 17:18  ANTIBIOTICS AT COLL.:                                RECEIVED :  08/24/15 23:42  Culture Urine                              FINAL       08/26/15 15:43   +  08/26/15   >100,000 CFU/ML Enterococcus faecalis      _____________________________________________________________________________                                   E.faecalis     ANTIBIOTICS                     MIC  INTRP      _____________________________________________________________________________  Ampicillin  1     S        Gentamicin High Level [place]istan <=500   S        Levofloxacin                    <=1    S        Nitrofurantoin                 <=16    S  D1    Penicillin                       4     S        Streptomycin High Level Resist<=1000   S        Tetracycline                    >8     R        Vancomycin                       2     S          -----DRUG COMMENTS----------    D1:  Nitrofurantoin should only be used for the treatment of          uncomplicated cystitis.         [place] [facility] Antimicrobial Subcommittee June 2015  _____________________________________________________________________________            S=SUSCEPTIBLE     I=INTERMEDIATE     R=RESISTANT                            N/S=NON-SUSCEPTIBLE  _____________________________________________________________________________      Wound culture & gram stain [[id number]] Collected:  08/29/15 0200    Specimen Information:  Wound from Wound Updated:  08/31/15 1209    Narrative:      ORDER#: [id number]                                    ORDERED BY: Nicoletta Dress  SOURCE: Wound POSTERIOR CERVICAL WOUND               COLLECTED:  08/29/15 02:00  ANTIBIOTICS AT COLL.:                                RECEIVED :  08/29/15 05:54  Stain, Gram                                FINAL       08/29/15 08:45  08/29/15   No Squamous epithelial cells seen             Rare WBCs             No organisms seen  Culture and Gram Stain, Aerobic, Wound     FINAL       08/31/15 12:09   +  08/31/15   Very light growth of mixed cutaneous flora              Imaging, reviewed and are significant for:  Ct Abdomen Pelvis Wo Iv/ Wo Po Cont    09/02/2015  1. Decompressed stomach located within the anterior  abdomen extending below the left hepatic lobe margin and above the transverse colon. 2. Nonspecific gallbladder wall thickening with pericholecystic fluid and stranding in the presence of small ascites.  Recommend clinical correlation for acute cholecystitis. 3. Bilateral small effusions and basilar lower lobe consolidative atelectasis. 4. Cardiac enlargement. These urgent findings and/or recommendations were called to   Dr. Durene Cal  at  9:55 PM on 09/02/2015. Charlott Rakes, MD 09/02/2015 9:57 PM     US Abdomen Limited Ruq    09/03/2015  1. Similar findings as seen on recent CT. Thick-walled bladder without significant distention with pericholecystic fluid. Lack of gallstones and clear focal tenderness makes cholecystitis less likely. The  patient was uncooperative for this portable examination however. Differential considerations would include hypoalbuminemia, chronic cholecystitis or acute cholecystitis. If continued clinical concern can perform HIDA scan. 2. Bilateral effusions. 3. Patient refused renal evaluation. Charlott Rakes, MD 09/03/2015 10:24 AM           Signed by: Isaias Cowman, MD

## 2015-09-03 NOTE — Progress Notes (Addendum)
[facility]   Occupational Therapy Cancellation Note      Patient:  Ernest Diaz MRN#:  [id number]  Unit:  [facility] TOWER 7 Room/Bed:  N829/F621.30    09/03/2015  Time: 1030      Patient not seen for occupational therapy secondary to patient transferred to higher level of care and will need new orders as appropriate.     Tora Perches OTR/L  Pager [phone]

## 2015-09-03 NOTE — Progress Notes (Signed)
Neurosurgery Progress Note  POD 5  S:       O:   Filed Vitals:    09/03/15 1600   BP: 107/65   Pulse: 85   Temp: 97.5 F (36.4 C)   Resp: 16   SpO2: 100%         PHYSICAL EXAM:  Awake  Denies pain  Hypophonic  Follows commands briskly in arms        A/P:   Neuro stable  Breathing more comfortably  PEG planned for tomorrow  Continue supportive measures      Rella Larve, MD

## 2015-09-03 NOTE — Plan of Care (Signed)
Problem: Inadequate Gas Exchange  Goal: Adequately oxygenating and ventilation is improved  Outcome: Progressing  Patient refuses suctioning. Breath sounds very coarse and rhonchus throughout bilaterally. Does not cooperative with other respiratory treatments such as IS, leaving in nasal canula, or IPV. On 6L NC and O2 sats consistently remaining lower than yesterday in mid 90s. Turned Q2,. Oral care done as patient permits, twice this shift. Mouth breather, so lips and tongue frequently dry. Patient to have PEG placed in IR tomorrow.    Continue to encourage patient allowance of aggressive pulmonary toileting.

## 2015-09-03 NOTE — PT Progress Note (Signed)
[facility]   Physical Therapy Cancellation Note      Patient:  Ernest Diaz MRN#:  [id number]  Unit:  [facility] TOWER 7 Room/Bed:  F715/F715.01    09/03/2015  Time: 6:17 AM       Pt not seen for physical therapy secondary to pt transferred to higher level of care with aspiration pneumonia. Will need new orders when appropriate.    Dolphus Jenny, PT, DPT   Pager number: [phone]

## 2015-09-03 NOTE — Progress Notes (Signed)
Palliative Medicine Follow up Note   Date Time: 09/03/2015   Patient Name: Ernest Diaz  Attending physician:  Isaias Cowman, MD   Palliative care provider: Jerrell Belfast    IMPRESSION:  70 yo man s/p traumatic fall and 3 neurosurgical interventions to stabilize spine and address AVM.    RECOMMENDATIONS:  1. Dyspnea: resolved - on Nasal cannula only. NPO no NGT - plan for PEG tomorrow. No audible congestion shallow respirations.  2. Dysphagia: PEG planned.  3. Goals of care: currently to follow clinical course with rehab in effort to determine extent of recovery. I reconsulted PT/OT. Family is aware after our discussion of potential challenges in coming weeks/months. Patient is DNR DNI and we will ensure durable DNR follows him to facility if patient elects.  4. Anticipated disposition: Rehab/SNF    Discussed with Dr. Wynetta Fines. Will follow at intervals -please call for any needs.    Jerrell Belfast  Palliative Medicine   Spectra: [phone] M-F 9-4  Extend Pager: Palliative Medicine [facility] 24/7      Subjective:    Patient lethargic but arouses. Not speaking in full sentences for me. Denies pain/dyspnea. Acknowledges plan for PEG tomorrow. Drifts of to sleep like state.   Wife and son at bedside. Son very positive today feels patient is better    Medications:     Current Facility-Administered Medications   Medication Dose Route Frequency   . acetylcysteine  3 mL Nebulization Q6H   . balsam peru-castor oil (VENELEX)   Topical Q12H SCH   . doxazosin  1 mg Oral Once    Followed by   . doxazosin  2 mg Oral QHS   . famotidine  20 mg Oral Q12H SCH    Or   . famotidine  20 mg Intravenous Q12H SCH   . fludrocortisone  0.2 mg Oral Daily   . midodrine  10 mg Oral TID MEALS   . piperacillin-tazobactam  4.5 g Intravenous 4 times per day   . polyethylene glycol  17 g Oral Daily   . QUEtiapine  25 mg Oral QHS   . senna-docusate  2 tablet Oral TID     Current Facility-Administered Medications    Medication Dose Route   . acetaminophen  325 mg Oral   . albuterol  2.5 mg Nebulization   . benzocaine-menthol  1 lozenge Buccal   . bisacodyl  10 mg Rectal   . calcium carbonate  1,000 mg Oral   . dextrose  15 g of glucose Oral    And   . dextrose  25 mL Intravenous    And   . glucagon (rDNA)  1 mg Intramuscular   . hydrALAZINE  10 mg Intravenous   . HYDROmorphone  0.5 mg Intravenous   . labetalol  10 mg Intravenous   . methocarbamol  750 mg Oral   . naloxone  0.2 mg Intravenous   . ondansetron  4 mg Intravenous   . oxyCODONE  15 mg Oral   . phenol  1 spray Oral         Physical Exam:     Filed Vitals:    09/03/15 1600   BP: 107/65   Pulse: 85   Temp: 97.5 F (36.4 C)   Resp: 16   SpO2: 100%     General: lethargic; answers very simple questions, no acute distress  ENT/Oral: mucous membranes moist, cervical collar on.   CV: regular rate and rhythm, no murmurs, rubs or  gallops  Lungs: CTAB, no wheeze/rales or rhonchi, no accessory muscle use  Abd: soft, non-tender, non-distended; normoactive bowel sounds  GU: Foley draining yellow urine  Ext: no clubbing, cyanosis, trace edema      Labs:       Recent Labs  Lab 09/03/15  0520   WBC 8.63   HGB 10.1*   HEMATOCRIT 34.0*   PLATELETS 222         Recent Labs  Lab 09/03/15  0520   SODIUM 149*   POTASSIUM 4.6   CHLORIDE 107   CO2 36*   BUN 12.0   CREATININE 0.5*   EGFR >60.0   GLUCOSE 135*   CALCIUM 8.5         Recent Labs  Lab 09/03/15  0520   BILIRUBIN, TOTAL 0.6   BILIRUBIN, DIRECT 0.3   PROTEIN, TOTAL 5.5*   ALBUMIN 2.3*   ALT 14   AST (SGOT) 18       Ct Abdomen Pelvis Wo Iv/ Wo Po Cont    09/02/2015  1. Decompressed stomach located within the anterior abdomen extending below the left hepatic lobe margin and above the transverse colon. 2. Nonspecific gallbladder wall thickening with pericholecystic fluid and stranding in the presence of small ascites.  Recommend clinical correlation for acute cholecystitis. 3. Bilateral small effusions and basilar lower lobe  consolidative atelectasis. 4. Cardiac enlargement. These urgent findings and/or recommendations were called to   Dr. Durene Cal  at  9:55 PM on 09/02/2015. Charlott Rakes, MD 09/02/2015 9:57 PM     US Abdomen Limited Ruq    09/03/2015  1. Similar findings as seen on recent CT. Thick-walled bladder without significant distention with pericholecystic fluid. Lack of gallstones and clear focal tenderness makes cholecystitis less likely. The patient was uncooperative for this portable examination however. Differential considerations would include hypoalbuminemia, chronic cholecystitis or acute cholecystitis. If continued clinical concern can perform HIDA scan. 2. Bilateral effusions. 3. Patient refused renal evaluation. Charlott Rakes, MD 09/03/2015 10:24 AM

## 2015-09-03 NOTE — Plan of Care (Signed)
Problem: Safety  Goal: Patient will be free from injury during hospitalization  Outcome: Progressing    Problem: Pain  Goal: Patient's pain/discomfort is manageable  Outcome: Progressing    Problem: Neurological Deficit  Goal: Neurological status is stable or improving  Outcome: Progressing

## 2015-09-03 NOTE — Progress Notes (Signed)
[facility] SOCIAL WORK  Date Time: 09/03/2015 1:55 PM  Patient Name: Ernest Diaz  Social Worker: Tawni Pummel          Situation/Subjective: Pt lying in bed, awake and able to answer questions. Pt appears sleepy and is not easily engaged, but family reports he has been talking to them today. Pt.'s wife, Ernest Diaz and son Ernest Diaz are at bedside.    Background: 70 year old male with HTN, and Afib. 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 S/p 3 surgical neurosurgical interventions since 2/27. Noted aspiration with mild to moderate dysphagia.    Palliative care consulted for dyspnea, dysphagia and goals of care.    Assessment: SW met with Pt and his family alongside Dr. Pennelope Bracken. Pt.'s wife feels Pt is looking better today and family is encouraged that Pt is making some improvements. Pt is able to respond to questions with prompting. SW introduced self and role, particularly as a support during a challenging hospital stay. Pt.'s wife reflects that Pt.'s condition is hard to accept given how functional he was prior to admission.    Pt.'s son is visiting from [facility] and has been able to stay in the area for the last month while tele-working. He will have to return to his job and life in [place] soon. Son feels that he would like Pt to try rehabilitation here first, and depending on Pt.'s progress, he would then consider having Pt and Pt.'s wife transfer to a nursing facility in [place]. Pt.'s wife does have dementia, it is unclear if she is safe to return to her home alone.    SW offered to continue to support family during this difficult time, wife and son were receptive to collaboration and guidance from Palliative Care.    Interventions/Plan:   Family would like Pt to be placed at [facility] when medically appropriate.  Discussed with SW/CM Dorothea Ogle.  Family would consider transitioning Pt and his wife down to [place] where son lives if Pt.'s  recovery is slow.  Palliative care will continue to follow.    Care network:  Medical decision maker:  Ernest Diaz, [phone] and wife, Ernest Diaz, [phone]/ [phone] - Pt.'s wife has mild to moderate dementia, so decision making should be addressed with Pt.'s son.  Primary caregiver: Pt did not require caregiver prior to admission. Pt was dependent in ADL's prior to admission.  Living situation: Pt was living at home with his wife in [place].   Advance Directive status: Not addressed today.  Current code status:  NO CPR - SUPPORT OK     Cultural/Spiritual factors:   Spiritual framework: Roman Catholic  Is this a resource: ?Yes. Family is open to Chaplain support.    Adolm Joseph, ACHP-SW  Palliative Care Therapist  [phone] Main  [phone] Spectra/VM  Extend Pager: Palliative Medicine [facility]

## 2015-09-03 NOTE — Plan of Care (Signed)
IR consulted for percutaneous PEG tube placement. Ernest Diaz is a 70 y.o male s/p fall with cervical and thoracic spine injuries s/p surgical decompression. He is now found to have dysphagia. CT of the abdomen reviewed by IR attending Dr. Jomarie Longs and GJ tube placement approved for tomorrow under anesthesia. Will obtained updated coags with morning labs, patient will need to be NPO for the procedure.      Signed by: Marcy Siren, PA-C    [facility]   [facility]  [place] spectralink: [phone]   After hours office ph: [phone], fax: [phone]

## 2015-09-03 NOTE — SLP Progress Note (Signed)
[facility]   Speech Therapy Cancellation Note      Patient:  Eastin Swing III MRN#:  [id number]  Unit:  [facility] TOWER 7 Room/Bed:  J191/Y782.95    09/03/2015  Time: 6213      Patient not seen for speech therapy secondary to: pt transferred to higher level of care. Will need new orders if/when medically appropriate.    Thank You,    Lenon Ahmadi, MS, CCC-SLP

## 2015-09-03 NOTE — Progress Notes (Signed)
Worker met w/pt, spouse and son@ bedside on 09/03/15.  Explained worker's understanding that current discharge plan is placement@ [facility]; son expressed that [facility] had been the plan initially, but as pt has now had three surgeries, son is re-thinking choice of facilities. Inquired as to whether worker considered [facility] the most appropriate facility to meet pt's needs at this time. Worker explained that, while policy keeps this worker from recommending a specific facility, worker is willing to print out a discharge packet with information about facilities, and son expressed that he is familiar with that as he has received it in the past.   Worker and son agreed to wait until new PT/OT evaluations have been completed; Clinical research associate will continue to follow.    Frederich Chick, LCSW  Case Management and Discharge Planning  [phone]

## 2015-09-04 ENCOUNTER — Encounter
Admission: AD | Disposition: A | Discharge status: Other Acute Inpatient Hospital | Payer: Self-pay | Source: Other Acute Inpatient Hospital | Attending: Internal Medicine

## 2015-09-04 ENCOUNTER — Inpatient Hospital Stay: Payer: Medicare Other | Admitting: Anesthesiology

## 2015-09-04 ENCOUNTER — Encounter: Payer: Self-pay | Admitting: Anesthesiology

## 2015-09-04 ENCOUNTER — Inpatient Hospital Stay: Payer: Medicare Other

## 2015-09-04 DIAGNOSIS — J9600 Acute respiratory failure, unspecified whether with hypoxia or hypercapnia: Secondary | ICD-10-CM

## 2015-09-04 DIAGNOSIS — Z9889 Other specified postprocedural states: Secondary | ICD-10-CM

## 2015-09-04 LAB — CBC AND DIFFERENTIAL
Basophils Absolute Automated: 0 10*3/uL (ref 0.00–0.20)
Basophils Automated: 0 %
Eosinophils Absolute Automated: 0.01 10*3/uL (ref 0.00–0.70)
Eosinophils Automated: 0 %
Hematocrit: 36.3 % — ABNORMAL LOW (ref 42.0–52.0)
Hgb: 10.7 g/dL — ABNORMAL LOW (ref 13.0–17.0)
Immature Granulocytes Absolute: 0.02 10*3/uL
Immature Granulocytes: 0 %
Lymphocytes Absolute Automated: 0.56 10*3/uL (ref 0.50–4.40)
Lymphocytes Automated: 7 %
MCH: 32 pg (ref 28.0–32.0)
MCHC: 29.5 g/dL — ABNORMAL LOW (ref 32.0–36.0)
MCV: 108.7 fL — ABNORMAL HIGH (ref 80.0–100.0)
MPV: 10.5 fL (ref 9.4–12.3)
Monocytes Absolute Automated: 0.6 10*3/uL (ref 0.00–1.20)
Monocytes: 8 %
Neutrophils Absolute: 6.44 10*3/uL (ref 1.80–8.10)
Neutrophils: 84 %
Nucleated RBC: 0 /100 WBC (ref 0–1)
Platelets: 215 10*3/uL (ref 140–400)
RBC: 3.34 10*6/uL — ABNORMAL LOW (ref 4.70–6.00)
RDW: 14 % (ref 12–15)
WBC: 7.63 10*3/uL (ref 3.50–10.80)

## 2015-09-04 LAB — COMPREHENSIVE METABOLIC PANEL
ALT: 13 U/L (ref 0–55)
AST (SGOT): 15 U/L (ref 5–34)
Albumin/Globulin Ratio: 0.7 — ABNORMAL LOW (ref 0.9–2.2)
Albumin: 2.2 g/dL — ABNORMAL LOW (ref 3.5–5.0)
Alkaline Phosphatase: 70 U/L (ref 38–106)
BUN: 11 mg/dL (ref 9.0–28.0)
Bilirubin, Total: 0.6 mg/dL (ref 0.2–1.2)
CO2: 37 mEq/L — ABNORMAL HIGH (ref 22–29)
Calcium: 8.4 mg/dL (ref 7.9–10.2)
Chloride: 102 mEq/L (ref 100–111)
Creatinine: 0.5 mg/dL — ABNORMAL LOW (ref 0.7–1.3)
Globulin: 3.3 g/dL (ref 2.0–3.6)
Glucose: 150 mg/dL — ABNORMAL HIGH (ref 70–100)
Potassium: 4.5 mEq/L (ref 3.5–5.1)
Protein, Total: 5.5 g/dL — ABNORMAL LOW (ref 6.0–8.3)
Sodium: 143 mEq/L (ref 136–145)

## 2015-09-04 LAB — PT/INR
PT INR: 1.2 — ABNORMAL HIGH (ref 0.9–1.1)
PT: 15.6 s — ABNORMAL HIGH (ref 12.6–15.0)

## 2015-09-04 LAB — GLUCOSE WHOLE BLOOD - POCT
Whole Blood Glucose POCT: 144 mg/dL — ABNORMAL HIGH (ref 70–100)
Whole Blood Glucose POCT: 97 mg/dL (ref 70–100)

## 2015-09-04 LAB — GFR: EGFR: >60

## 2015-09-04 SURGERY — G,J,G/J TUBE PLACEMENT
Anesthesia: Anesthesia General

## 2015-09-04 MED ORDER — LIDOCAINE HCL 2 % IJ SOLN
INTRAMUSCULAR | Status: DC | PRN
Start: 2015-09-04 — End: 2015-09-04
  Administered 2015-09-04: 100 mg

## 2015-09-04 MED ORDER — FENTANYL CITRATE (PF) 50 MCG/ML IJ SOLN (WRAP)
INTRAMUSCULAR | Status: DC | PRN
Start: 2015-09-04 — End: 2015-09-04
  Administered 2015-09-04 (×2): 25 ug via INTRAVENOUS

## 2015-09-04 MED ORDER — PROPOFOL 10 MG/ML IV EMUL (WRAP)
INTRAVENOUS | Status: AC
Start: 2015-09-04 — End: 2015-09-04
  Administered 2015-09-04: 25 ug/kg/min via INTRAVENOUS
  Filled 2015-09-04: qty 100

## 2015-09-04 MED ORDER — SODIUM CHLORIDE 0.9 % IV MBP
4.5000 g | Freq: Three times a day (TID) | INTRAVENOUS | Status: DC
Start: 2015-09-04 — End: 2015-09-05
  Administered 2015-09-04 – 2015-09-05 (×3): 4.5 g via INTRAVENOUS
  Filled 2015-09-04 (×3): qty 20

## 2015-09-04 MED ORDER — SODIUM CHLORIDE 0.9 % IV SOLN
INTRAVENOUS | Status: DC | PRN
Start: 2015-09-04 — End: 2015-09-04

## 2015-09-04 MED ORDER — DEXTROSE 5 % IV SOLN
INTRAVENOUS | Status: DC
Start: 2015-09-04 — End: 2015-09-04
  Administered 2015-09-04: 100 mL/h via INTRAVENOUS

## 2015-09-04 MED ORDER — PROPOFOL INFUSION 10 MG/ML
0.0000 ug/kg/min | INTRAVENOUS | Status: DC
Start: 2015-09-04 — End: 2015-09-05
  Administered 2015-09-05 (×2): 30 ug/kg/min via INTRAVENOUS
  Filled 2015-09-04 (×2): qty 100

## 2015-09-04 MED ORDER — PROPOFOL INFUSION 10 MG/ML
INTRAVENOUS | Status: DC | PRN
Start: 2015-09-04 — End: 2015-09-04
  Administered 2015-09-04: 50 ug/kg/min via INTRAVENOUS

## 2015-09-04 MED ORDER — FUROSEMIDE 10 MG/ML IJ SOLN
20.0000 mg | Freq: Once | INTRAMUSCULAR | Status: AC
Start: 2015-09-04 — End: 2015-09-04
  Administered 2015-09-04: 20 mg via INTRAVENOUS
  Filled 2015-09-04: qty 4

## 2015-09-04 MED ORDER — DEXTROSE-SODIUM CHLORIDE 5-0.45 % IV SOLN
INTRAVENOUS | Status: DC
Start: 2015-09-05 — End: 2015-09-05

## 2015-09-04 MED ORDER — LIDOCAINE 1% BUFFERED - CNR/OUTSOURCED
INTRAMUSCULAR | Status: AC
Start: 2015-09-04 — End: 2015-09-05
  Filled 2015-09-04: qty 22

## 2015-09-04 MED ORDER — LIDOCAINE 1% BUFFERED - CNR/OUTSOURCED
INTRAMUSCULAR | Status: AC | PRN
Start: 2015-09-04 — End: 2015-09-04
  Administered 2015-09-04: 10 mL via INTRADERMAL

## 2015-09-04 MED ORDER — PROPOFOL 10 MG/ML IV EMUL (WRAP)
INTRAVENOUS | Status: AC
Start: 2015-09-04 — End: ?
  Filled 2015-09-04: qty 50

## 2015-09-04 MED ORDER — PHENYLEPHRINE HCL 10 MG/ML IV SOLN (WRAP)
100.0000 mg | Status: DC | PRN
Start: 2015-09-04 — End: 2015-09-04
  Administered 2015-09-04: 50 ug/min via INTRAVENOUS

## 2015-09-04 MED ORDER — ROCURONIUM BROMIDE 10 MG/ML IV SOLN (WRAP)
INTRAVENOUS | Status: DC | PRN
Start: 2015-09-04 — End: 2015-09-04
  Administered 2015-09-04: 30 mg via INTRAVENOUS

## 2015-09-04 MED ORDER — ROCURONIUM BROMIDE 50 MG/5ML IV SOLN
INTRAVENOUS | Status: AC
Start: 2015-09-04 — End: ?
  Filled 2015-09-04: qty 5

## 2015-09-04 MED ORDER — LIDOCAINE HCL (PF) 2 % IJ SOLN
INTRAMUSCULAR | Status: AC
Start: 2015-09-04 — End: ?
  Filled 2015-09-04: qty 5

## 2015-09-04 MED ORDER — PROPOFOL 10 MG/ML IV EMUL (WRAP)
INTRAVENOUS | Status: AC
Start: 2015-09-04 — End: ?
  Filled 2015-09-04: qty 20

## 2015-09-04 MED ORDER — MIDAZOLAM HCL 2 MG/2ML IJ SOLN
INTRAMUSCULAR | Status: AC
Start: 2015-09-04 — End: ?
  Filled 2015-09-04: qty 2

## 2015-09-04 MED ORDER — MIDAZOLAM HCL 2 MG/2ML IJ SOLN
INTRAMUSCULAR | Status: DC | PRN
Start: 2015-09-04 — End: 2015-09-04
  Administered 2015-09-04: 1 mg via INTRAVENOUS
  Administered 2015-09-04: 9 mg via INTRAVENOUS
  Administered 2015-09-04: 1 mg via INTRAVENOUS

## 2015-09-04 MED ORDER — PROPOFOL 10 MG/ML IV EMUL (WRAP)
INTRAVENOUS | Status: DC | PRN
Start: 2015-09-04 — End: 2015-09-04
  Administered 2015-09-04: 30 mg via INTRAVENOUS

## 2015-09-04 MED ORDER — GLUCAGON 1 MG IJ SOLR (WRAP)
INTRAMUSCULAR | Status: AC
Start: 2015-09-04 — End: 2015-09-04
  Administered 2015-09-04: 0.5 mg via INTRAMUSCULAR
  Filled 2015-09-04: qty 1

## 2015-09-04 MED ORDER — FENTANYL CITRATE (PF) 50 MCG/ML IJ SOLN (WRAP)
INTRAMUSCULAR | Status: AC
Start: 2015-09-04 — End: ?
  Filled 2015-09-04: qty 2

## 2015-09-04 MED ORDER — IOHEXOL 300 MG/ML IJ SOLN
60.0000 mL | Freq: Once | INTRAMUSCULAR | Status: AC
Start: 2015-09-04 — End: 2015-09-04
  Administered 2015-09-04: 60 mL

## 2015-09-04 NOTE — Plan of Care (Signed)
Problem: Safety  Goal: Patient will be free from injury during hospitalization  Intervention: Hourly rounding.  Check on patient every hour, tailored call bell to patient's needs      Problem: Pain  Goal: Patient's pain/discomfort is manageable  Intervention: Offer non-pharmocological pain management interventions  Reposition frequently, rest, relaxation, music.      Problem: Psychosocial and Spiritual Needs  Goal: Demonstrates ability to cope with hospitalization/illness  Intervention: Encourage patient to set small goals for self  Pt's own goals: "take deep breaths", "rest" "sleep"

## 2015-09-04 NOTE — Sedation Documentation (Signed)
Transferred patient from stretcher to procedural table without difficulty using slideboard. Lying supine on table.

## 2015-09-04 NOTE — Sedation Documentation (Signed)
All vital signs, cardiac monitoring, IV Fluid administration, oxygenation and medication administration will be done by anesthesia. Please refer to anesthesia flow sheet for data.

## 2015-09-04 NOTE — Sedation Documentation (Signed)
Pre anesthesia  pause done. Name, DOB , MRN, allergy and type of procedure to be done verbalized.  All team members in agreement with information.

## 2015-09-04 NOTE — Progress Notes (Signed)
[facility]   Chaplain Note  Date Time: 09/04/2015 2:01 PM  Patient Name: Ernest Diaz  Chaplain: Georga Hacking Dareld Mcauliffe  Consulting Service: Palliative Medicine & Comprehenive Care      Met with: Patient and Family: son and his girlfriend; cousin; long-time friend    Patients Religion / Spirituality: Catholic    Support Community: family, friends, [employer]    Spiritual Care Provided:   When chaplain visited, his son welcomed Orthoptist. He was visiting with two gentleman who have known "Dec" a long time.  [name] was sleeping and his son asked to not disturb him.  The son said the [employer], Fr Sharlyne Pacas and [employer], have visited several times and blessed his father.  Offered to hold them in prayer and they were grateful for the visit.      Spiritual Strength:faith in God and love of family    Spiritual Distress:  Did not detect any    Plan of Care: well supported spiritually; not following; available upon request      Total time Spent: 20 min      Chaplain  Georga Hacking Jenalee Trevizo  [facility] Palliative Medicine Team  Team Spectra # [phone]  Direct Spectra # [phone]  Mon-Fri: 9 am - 4 pm

## 2015-09-04 NOTE — Sedation Documentation (Signed)
Fluoroscopy guided G-J Tube placement done by Dr. Excell Seltzer without incidence. Pt tolerated well. In NAD. VS stable throughout the case. No respiratory distress experienced during the case.  No bleeding or hematoma noted to G-JTube site.  No pain or discomfort experienced. Report to RN given. Awaiting transport back to Room.

## 2015-09-04 NOTE — Plan of Care (Signed)
Code status reversed by patient/family for PEG placement.  Patient intubated for procedure and remains intubated.  Evaluated by [facility] with plans to keep patient intubated overnight.  Transferred to [facility] service, Dr. Verlin Dike, for continued care at this time.

## 2015-09-04 NOTE — Sedation Documentation (Signed)
Patient prepped and draped in usual sterile fashion.  Lying in supine position. Both arms down by her side. Padded arm boards in place. Body in good alignment.

## 2015-09-04 NOTE — H&P (Signed)
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  Plan image guided PGJ placement.    PAST MEDICAL HISTORY:     Past Medical History   Diagnosis Date   . Hypertension    . Arthritis    . Pericarditis 1975   . Atrial fibrillation    . Arrhythmia    . 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 Vein surgery     . Colonoscopy       x2   . 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 Eye surgery  1960     Rt eye removed   . Decompression, anterior cervical, fusion, levels 2 N/A 08/20/2015     Procedure: DECOMPRESSION, ANTERIOR CERVICAL, FUSION, LEVELS 2;  Surgeon: Madaline Savage, MD;  Location: [facility];  Service: Neurosurgery;  Laterality: N/A;  C5-C7 ACDF; AVM REMOVAL   . Decompression, posterior cervical, fusion, levels 2 N/A 08/20/2015     Procedure: DECOMPRESSION, POSTERIOR CERVICAL, FUSION, LEVELS 2;  Surgeon: Madaline Savage, MD;  Location: [facility];  Service: Neurosurgery;  Laterality: N/A;   . Ivc filter placement N/A 08/31/2015     Procedure: IVC FILTER PLACEMENT;  Surgeon: Leeroy Bock, MD;  Location: [facility];  Service: Interventional Radiology;  Laterality: N/A;       Family History:     Family History   Problem Relation Age of Onset   . Intracerebral hemorrhage Neg Hx    . Stroke Neg Hx        Social History:     Social History     Social History   . Marital Status: Married     Spouse Name: N/A   . Number of Children: N/A   . Years of Education: N/A     Social History Main Topics   . Smoking status: Former Smoker     Quit date: 03/07/1986   . Smokeless tobacco: Never Used   . Alcohol Use:  1.2 - 1.8 oz/week     2-3 Shots of liquor per week      Comment: daily   . Drug Use: No   . Sexual Activity: Not on file     Other Topics Concern   . Not on file     Social History Narrative         REVIEW OF SYSTEMS REVIEWED:   YES  (x  )        HOME MEDICATIONS     Prior to Admission medications    Medication Sig Start Date End Date Taking? Authorizing Provider   amitriptyline (ELAVIL) 10 MG tablet Take 10 mg by mouth nightly. Started at [facility] 08/2015   Yes [provider]   amLODIPine (NORVASC) 10 MG tablet Take 10 mg by mouth daily.   Yes [provider]   Calcium Carb-Cholecalciferol (CALCIUM 600 + D PO) Take by mouth daily.   Yes [provider]   Cholecalciferol (VITAMIN D3) 1000 UNITS Cap  Take by mouth every mon, wed and fri.   Yes [provider]   Flaxseed, Linseed, (FLAX SEEDS PO) Take by mouth daily.   Yes [provider]   folic acid (FOLVITE) 400 MCG tablet Take 400 mcg by mouth daily.   Yes [provider]   Magnesium 300 MG Cap Take by mouth daily.   Yes [provider]   Multiple Vitamins-Minerals (CENTRUM SILVER ADULT 50+ PO) Take by mouth daily.   Yes [provider]   Omega-3 Fatty Acids (FISH OIL) 1200 MG Cap Take by mouth daily.   Yes [provider]   triamterene-hydrochlorothiazide (MAXZIDE-25) 37.5-25 MG per tablet Take 1 tablet by mouth daily.   Yes [provider]   acetaminophen (TYLENOL) 325 MG tablet Take 1 tablet (325 mg total) by mouth every 4 (four) hours as needed. 08/22/15   Isaias Cowman, MD   atenolol (TENORMIN) 50 MG tablet Take 0.5 tablets (25 mg total) by mouth daily. 08/22/15   Isaias Cowman, MD   finasteride (PROSCAR) 5 MG tablet Take 1 tablet (5 mg total) by mouth nightly. 08/22/15   Isaias Cowman, MD   methocarbamol (ROBAXIN) 750 MG tablet Take 1 tablet (750 mg total) by mouth 3 (three) times daily as needed. 08/22/15   Isaias Cowman, MD   oxyCODONE 10 MG Tab Take  1 tablet (10 mg total) by mouth every 4 (four) hours as needed for Pain. 08/22/15   Isaias Cowman, MD   senna-docusate (PERICOLACE) 8.6-50 MG per tablet Take 1 tablet by mouth 2 (two) times daily. 08/22/15   Isaias Cowman, MD         INPATIENT MEDICATIONS      Current Facility-Administered Medications   Medication Dose Route Frequency   . balsam peru-castor oil (VENELEX)   Topical Q12H SCH   . buffered lidocaine       . doxazosin  1 mg Oral Once    Followed by   . doxazosin  2 mg Oral QHS   . famotidine  20 mg Oral Q12H SCH    Or   . famotidine  20 mg Intravenous Q12H SCH   . fludrocortisone  0.2 mg Oral Daily   . midodrine  10 mg Oral TID MEALS   . piperacillin-tazobactam  4.5 g Intravenous Q8H SCH   . polyethylene glycol  17 g Oral Daily   . QUEtiapine  25 mg Oral QHS   . senna-docusate  2 tablet Oral TID        acetaminophen, albuterol, benzocaine-menthol, bisacodyl, calcium carbonate, Nursing communication: Adult Hypoglycemia Treatment Algorithm **AND** dextrose **AND** dextrose **AND** glucagon (rDNA), hydrALAZINE, HYDROmorphone, labetalol, methocarbamol, naloxone, ondansetron, oxyCODONE, phenol      ALLERGIES:     Allergies   Allergen Reactions   . Sulfa Antibiotics Other (See Comments)     Lip swelling         PREVIOUS REACTION TO SEDATION MEDICATIONS     NO ( x  )   YES ( )      PHYSICAL EXAM     AIRWAY CLASSIFICATION:    CLASS I   (  )   CLASS II  ( x )    CLASS III  (  )     CLASS IV  (  )    INTUBATED (  )    CARDIAC :   ( x )  RRR  (  )  IRREG  (  )  MURMUR      LUNGS:   (x  )  CLEAR  (  )  DIMINISHED    (  ) LEFT   (  )  RIGHT  (  )  ABSENT          (  ) LEFT   (  )  RIGHT  (  )  TUBES            (  ) LEFT   (  )  RIGHT          ABDOMEN:   soft    NEURO:   Awake, does not respond appropriately to questions    OTHER:   Cervical collar in place      LABS:     Lab Results   Component Value Date/Time    WBC 7.63 09/04/2015 04:25 AM    HEMATOCRIT 36.3* 09/04/2015 04:25 AM    PLATELETS 215  09/04/2015 04:25 AM    PT INR 1.2* 09/04/2015 06:05 AM    PT 15.6* 09/04/2015 06:05 AM    PTT 24 08/17/2015 05:21 PM    BUN 11.0 09/04/2015 04:25 AM    CREATININE 0.5* 09/04/2015 04:25 AM    GLUCOSE 150* 09/04/2015 04:25 AM    POTASSIUM 4.5 09/04/2015 04:25 AM           ASA PHYSICAL STATUS   (  )  ASA 1   HEALTHY PATIENT  (  )  ASA 2   MILD SYSTEMIC ILLNESS  ( x )  ASA 3   SYSTEMIC DISEASE, NOT INCAPACITATING  (  )  ASA 4   SEVERE SYSTEMIC DISEASE, DISEASE IS CONSTANT THREAT TO LIFE  (  )  ASA 5   MORIBUND CONDITION, NOT EXPECTED TO LIVE >24 HOURS            IRRESPECTIVE OF PROCEDURE  (  )  E           EMERGENCY PROCEDURE       PLANNED SEDATION:   (  ) NO SEDATION  (  ) MODERATE SEDATION  ( x ) DEEP SEDATION WITH ANESTHESIA      CONCLUSION:   PATIENT HAS BEEN REASSESSED IMMEDIATELY PRIOR TO THE PROCEDURE   AND IS AN APPROPRIATE CANDIDATE FOR THE PLANNED SEDATION AND   PROCEDURE.  RISKS, BENEFITS AND ALTERNATIVES TO THE PLANNED   PROCEDURE AND SEDATION HAVE BEEN EXPLAINED TO THE PATIENT   OR GUARDIAN.    (x  )  YES  (  )  EMERGENCY CONSENT       Signed by: [facility]-Section of Vascular & Interventional Radiology  Contact Numbers:  Regular business hours (8A-5P M-F):  [facility]: [phone] (option 3-outpatient scheduling, option 4-consults, option 5-inpatient procedures)  [facility]: [phone]  [facility] Bab[facility]: [phone]  After hours/Answering service: [phone]

## 2015-09-04 NOTE — Transfer of Care (Signed)
Anesthesia Transfer of Care Note    Patient: Ernest Diaz    Procedures performed: Procedure(s):  G,J,G/J TUBE PLACEMENT    Anesthesia type: General ETT    Patient location:ICU    Last vitals:   Filed Vitals:    09/04/15 1724   BP: 130/73   Pulse: 81   Temp:    Resp: 15   SpO2: 100%       Post pain: Patient not complaining of pain, continue current therapy      Mental Status:sedated    Respiratory Function: intubated    Cardiovascular: stable    Nausea/Vomiting: patient not complaining of nausea or vomiting    Hydration Status: adequate    Post assessment: no apparent anesthetic complications and no reportable events    Signed by: Karlton Lemon Amaria Mundorf  09/04/2015 5:24 PM

## 2015-09-04 NOTE — Sedation Documentation (Signed)
Intubated without complication by anesthesia using a 7.5 mm ETT. Pt stable.

## 2015-09-04 NOTE — Progress Notes (Signed)
Weekend case management progress note:    Worker most recently met w/pt's family on 09/04/15. Pt's current [place] plan is placement @ [facility]. However, son expressed a desire to explore more options given pt's most recent surgeries.  List of facilities provided to the family on this date-worker advised family to research and visit facilities, and that facilities which accommodate Alzheimer's patients are noted on the list under "facility type". Family also inquired about the feasibility of moving pt to [place], and whether he should go to SNF here  Initially. Worker referred them to Dr. Pennelope Bracken, palliative care physician, for advice in that regard. Weekend CM will follow as appropriate and necessary.    Frederich Chick, LCSW  Case Management and Discharge Planning  [phone]

## 2015-09-04 NOTE — Anesthesia Preprocedure Evaluation (Addendum)
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, anterior and posterior cervical fusion on 08/20/15  Fracture dislocation C6-C7 with malpositioned hardware s/p C5-T1 posterior fusion, C5-7 anterior fusion with partial C7 corpectomy on 08/29/15  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

## 2015-09-04 NOTE — Progress Notes (Signed)
Critical Care Daily Progress Note       Active Hospital Problems    Diagnosis   . Acute respiratory failure   . Aspiration pneumonia   . Acute deep vein thrombosis (DVT) of femoral vein of right lower extremity   . Atrial fibrillation, unspecified type   . Shock   . Cord compression   . Quadriplegia   . Atrial fibrillation        Impression and Plan:  1. Acute Respiratory Failure - Underlying difficulty clearing secretions at root of respiratory failure.  Will need CPT and aggressive suctioning.  Will re-try SBT in AM  2. S/P Cervical Spine Injury and Surgery - Makes successful extubation more paramount.  Will extubate when optimized.    Medications:  Current Facility-Administered Medications   Medication Dose Route Frequency   . balsam peru-castor oil (VENELEX)   Topical Q12H SCH   . doxazosin  1 mg Oral Once    Followed by   . doxazosin  2 mg Oral QHS   . famotidine  20 mg Oral Q12H SCH    Or   . famotidine  20 mg Intravenous Q12H SCH   . fludrocortisone  0.2 mg Oral Daily   . midodrine  10 mg Oral TID MEALS   . piperacillin-tazobactam  4.5 g Intravenous Q8H SCH   . polyethylene glycol  17 g Oral Daily   . QUEtiapine  25 mg Oral QHS   . senna-docusate  2 tablet Oral TID       Infusions:  . propofol         Past 24 hour events significant for:  Pt intubated during PEG placement due to reported desaturation.  Was on CNS hospitalist service now in ICU.  Did SBT but pt failed due to desaturation and not following commands    Physical Examination:  BP 112/68 mmHg  Pulse 79  Temp(Src) 97.6 F (36.4 C) (Axillary)  Resp 18  Ht 1.803 m (5\' 11" )  Wt 96.7 kg (213 lb 3 oz)  BMI 29.75 kg/m2  SpO2 95%      NECK: Supple; No JVD  LUNGS: Coarse BS Bilaterally  HEART: RRR no MGR  ABD: NABS; SNT; PEG in place  EXT: No Edema  NEURO: Intubated; Arouses but does not follow commands  LINES: PIVs  PROPHY: SCDs and H2B    Labs and x-rays reviewed    Critical care time excluding procedures = 80 min    Greater than 50% spent in  counseling and coordination of care regarding current medical management and future plan of care

## 2015-09-04 NOTE — Progress Notes (Addendum)
MEDICINE PROGRESS NOTE    Date Time: 09/04/2015 12:17 PM  Patient Name: Ernest Diaz  Attending Physician: Isaias Cowman, MD    Assessment:   Principal Problem:    Quadriplegia  Active Problems:    Atrial fibrillation    Shock    Cord compression    Acute respiratory failure    Aspiration pneumonia    Acute deep vein thrombosis (DVT) of femoral vein of right lower extremity    Atrial fibrillation, unspecified type  Resolved Problems:    Thoracic spinal AV fistula      Plan:   1. Traumatic cervicothoracic epidural hematoma s/p C3-C5 + T3-T4 decompressive laminectomies 2/27 by Dr. 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 Jaynie Collins on 08/20/15  [facility] course complicated by fracture dislocation C6-C7 with malpositioned hardware s/p C5-T1 posterior fusion, C5-7 anterior fusion with partial C7 corpectomy on 3/18 with NSGY  - Now requiring Midodrine 10 mg TID and Florinef 0.2 mg q day for BP support for autonomic dysfunction, MAP goal > 65 (has not been receiving over past 2 days due to aspiration with po medications, patient has not wanted NG tube, plan for PEG placement with anesthesia today)  - Will need outpatient evaluation by NSGY prior to clearance for [facility] or antiplatelets   - C-collar at all times, fall precautions in place     2. Hx A Fib on Eliquis, CHADS2VASC of 2   - Rate controlled without agents, Atenolol discontinued given MAP goals > 65 and requiring Florinef and Midodrine (although has not been receiving for past 2 days as above)  -Patient asymptomatic with appropriate chronotropic response  -Hold AC and antiplatelets given procedure at this time  -Discussed with patient, aware of increased risk of CVA without AC or antiplatelets however also understands contraindication from NSGY  perspective given risk of bleeding into spine, will need clearance from Dr. Jaynie Collins prior to restarting as an outpatient     3. Urinary Retention  - Bladder scan without Foley > 300 cc, replaced Foley   - Likely due to recent neurosurgical procedure  - Unable to have Flomax due to sulfa allergy, continue Cardura  - Patient declined Renal US     4. Respiratory Failure  - Completed 10 days of Zosyn however sputum cx shows light growth GNR, therefore will continue Zosyn at this time   - ID following and appreciate recommendations  - Now stable on 4-5 L NC, continue IPV, albuterol nebs, Mucomyst, aggressive pulm toilet, patient has intermittently been refusing suctioning and pulmonary toilet despite counseling regarding benefits and risks of mucous plugging and respiratory arrest    - Code status DNR/DNI, would be poor candidate for BiPAP given high aspiration risk  - Given hypervolemia, will diurese with Lasix 20 mg IV X 1 to optimize volume status  - CXR for further evaluation     5. Dysphagia- Likely due to recent cervical spine surgeries   - NPO at this time, has declined repeat placement of NG tube    - IR consulted for PEG placement, plan for procedure 3/24 with anesthesia     6. RLE DVT- Unable to have AC due to recent surgery   - IVC filter placed by IR on 08/31/15  - Will need NSGY clearance  prior to starting [facility] as an outpatient    7. Gallbladder Wall Thickening- Incidentally noted on CT A/P obtained for procedure planning  - RUQ [place] does not show definitive evidence of cholecystitis  - Patient asymptomatic, without fevers/leukocytosis, LFTs without significant acute abnormality   - Will obtain HIDA scan if patient becomes symptomatic or develops abnormalities on labwork    8. Hypernatremia- Likely due to decreased free water intake, BUN/Cr > 20 and now resolved  - D/C IVF given crackles and hypervolemic on examination   - Lasix 20 mg IV X 1 as above     DVT PPX: Lovenox (cleared for chemical ppx only by NSGY at  this time)    Palliative care following for goals of care.  Patient's code status is DNR/DNI however code status can be reversed for procedures per patient/family.      Case discussed with: Patient, RN, Family at bedside      Safety Checklist:     DVT prophylaxis:  CHEST guideline (See page [id number]) Chemical   Foley:  [place] Rn Foley protocol Present and continue: Indication: Urinary retention   IVs:  Peripheral IV   PT/OT: Ordered   Daily CBC & or Chem ordered:  SHM/ABIM guidelines (see #5) Yes, due to clinical and lab instability   Reference for approximate charges of common labs: CBC auto diff - $76  BMP - $99  Mg - $79    Lines:     Patient Lines/Drains/Airways Status    Active PICC Line / CVC Line / PIV Line / Drain / Airway / Intraosseous Line / Epidural Line / ART Line / Line / Wound / Pressure Ulcer / NG/OG Tube     Name:   Placement date:   Placement time:   Site:   Days:    CVC Triple Lumen 08/30/15 Left Subclavian  08/30/15   0040   Subclavian   2    Peripheral IV 08/27/15 Right Antecubital  08/27/15   1100   Antecubital   5    Peripheral IV 08/29/15 Left Hand  08/29/15   1345   Hand   3    Peripheral IV 08/30/15 Right Antecubital  08/30/15   0030   Antecubital   2    Urethral Catheter Non-latex 16 Fr.  09/01/15   1118   Non-latex   less than 1    Arterial Line Left               Wound 08/25/15 Pressure Injury Buttocks Left;Right Non-Blanchable Erythema  08/25/15   2258   Buttocks   6    Incision Site 08/20/15 Neck  08/20/15   1427     11    Incision Site 08/20/15 Neck  08/20/15   1932     11    Incision Site 08/28/15 Neck  08/28/15   2352     3    Incision Site 08/29/15 Neck  08/29/15   0515     3    Feeding Tube Nasogastric Left nare  08/28/15      Left nare   4                 Disposition: (Please see PAF column for Expected D/C Date)   Today's date: 09/04/2015  Admit Date: 08/17/2015  4:29 PM  LOS: 18  Clinical Milestones: Neuro evaluation   Anticipated discharge needs: Per PT/OT recs      Subjective      CC: Quadriplegia  Interval History/24 hour events: No acute events overnight.  Patient denies chest pain, shortness of breath, abdominal pain, nausea, vomiting, diarrhea.  Sleepy however easily arousable.  Has intermittently declined NG tube placement and supplemental O2.  Plan for PEG placement with IR/Anesthesia today.  Plan of care discussed with patient and POA son Ernest Diaz at the bedside.         Review of Systems:     Review of Systems - Negative except as noted above in HPI      Physical Exam:     VITAL SIGNS PHYSICAL EXAM   Temp:  [97.5 F (36.4 C)-97.9 F (36.6 C)] 97.9 F (36.6 C)  Heart Rate:  [77-85] 78  Resp Rate:  [16-18] 18  BP: (99-133)/(54-67) 101/54 mmHg      Intake/Output Summary (Last 24 hours) at 09/04/15 1217  Last data filed at 09/04/15 0800   Gross per 24 hour   Intake 1916.67 ml   Output    795 ml   Net 1121.67 ml    Physical Exam  General: awake, alert X 3  Neck: Aspen collar in place  Cardiovascular: irregularly irregular, no murmurs, rubs or gallops  Lungs: bilateral crackles, no accessory muscle usage, no wheezing, mild ronchi  Abdomen: soft, non-tender, non-distended; no palpable masses, normoactive bowel sounds, no rebound, no guarding  Extremities: 1+ b/l LE edema   Neuro: CN grossly intact, speech fluent, follows commands, 4/5 b/l UE (except for grip 3/5 b/l in UE) and 0/5 b/l LE strength       Meds:     Medications were reviewed:    Labs:     Labs (last 72 hours):      Recent Labs  Lab 09/04/15  0425 09/03/15  0520   WBC 7.63 8.63   HGB 10.7* 10.1*   HEMATOCRIT 36.3* 34.0*   PLATELETS 215 222         Recent Labs  Lab 09/04/15  0605   PT 15.6*   PT INR 1.2*      Recent Labs  Lab 09/04/15  0425 09/03/15  0520   SODIUM 143 149*   POTASSIUM 4.5 4.6   CHLORIDE 102 107   CO2 37* 36*   BUN 11.0 12.0   CREATININE 0.5* 0.5*   CALCIUM 8.4 8.5   ALBUMIN 2.2* 2.3*   PROTEIN, TOTAL 5.5* 5.5*   BILIRUBIN, TOTAL 0.6 0.6   ALKALINE PHOSPHATASE 70 78   ALT 13 14   AST (SGOT) 15 18   GLUCOSE  150* 135*                   Microbiology, reviewed and are significant for:  Microbiology Results     Procedure Component Value Units Date/Time    AFB culture and smear [[id number]] Collected:  08/29/15 0200    Specimen Information:  Sputum from Wound Updated:  08/29/15 1142    Narrative:      ORDER#: [id number]                                    ORDERED BY: Nicoletta Dress  SOURCE: Wound POSTERIOR CERVICAL WOUND               COLLECTED:  08/29/15 02:00  ANTIBIOTICS AT COLL.:  RECEIVED :  08/29/15 05:54  Stain, Acid Fast                           FINAL       08/29/15 11:42  08/29/15   No Acid Fast Bacillus Seen  Culture Acid Fast Bacillus (AFB)           PENDING      Anaerobic culture [[id number]] Collected:  08/29/15 0200    Specimen Information:  Other from Wound Updated:  08/31/15 1035    Narrative:      ORDER#: [id number]                                    ORDERED BY: Nicoletta Dress  SOURCE: Wound POSTERIOR CERVICAL WOUND               COLLECTED:  08/29/15 02:00  ANTIBIOTICS AT COLL.:                                RECEIVED :  08/29/15 05:54  Culture, Anaerobic Bacteria                PRELIM      08/31/15 10:35  08/31/15   No growth to date, final report to follow      CULTURE BLOOD AEROBIC AND ANAEROBIC [[id number]] Collected:  08/24/15 0909    Specimen Information:  Blood, Venipuncture Updated:  08/29/15 1521    Narrative:      ORDER#: [id number]                                    ORDERED BY: Wynetta Fines, VIN  SOURCE: Blood, Venipuncture peripheral               COLLECTED:  08/24/15 09:09  ANTIBIOTICS AT COLL.:                                RECEIVED :  08/24/15 13:17  Culture Blood Aerobic and Anaerobic        FINAL       08/29/15 15:21  08/29/15   No growth after 5 days of incubation.      Fungus culture [[id number]] Collected:  08/29/15 0200    Specimen Information:  Other from Wound Updated:  08/29/15 1201    Narrative:      ORDER#: [id number]                                    ORDERED  BY: Nicoletta Dress  SOURCE: Wound POSTERIOR CERVICAL WOUND               COLLECTED:  08/29/15 02:00  ANTIBIOTICS AT COLL.:                                RECEIVED :  08/29/15 05:54  Stain, Fungal                              FINAL  08/29/15 12:01  08/29/15   No Fungal or Yeast Elements Seen  Culture Fungus                             PENDING      MRSA culture [[id number]] Collected:  08/17/15 2043    Specimen Information:  Body Fluid from Nasal/Throat ASC Admission Updated:  08/19/15 0328    Narrative:      ORDER#: [id number]                                    ORDERED BY: Paulita Fujita  SOURCE: Nares and Throat                             COLLECTED:  08/17/15 20:43  ANTIBIOTICS AT COLL.:                                RECEIVED :  08/18/15 03:03  Culture MRSA Surveillance                  FINAL       08/19/15 03:28  08/19/15   Negative for Methicillin Resistant Staph aureus from Nares and             Negative for Methicillin Resistant Staph aureus from Throat      Rapid influenza A/B antigens [[id number]] Collected:  08/31/15 1649    Specimen Information:  Nasopharyngeal from Nasal Aspirate Updated:  08/31/15 1727    Narrative:      ORDER#: [id number]                                    ORDERED BY: Irving Shows, EMIL  SOURCE: Nasal Aspirate                               COLLECTED:  08/31/15 16:49  ANTIBIOTICS AT COLL.:                                RECEIVED :  08/31/15 17:04  Influenza Rapid Antigen A&B                FINAL       08/31/15 17:27  08/31/15   Negative for Influenza A and B             Reference Range: Negative      Urine culture [[id number]] Collected:  08/24/15 1718    Specimen Information:  Urine from Urine, Catheterized, Foley Updated:  08/26/15 1544    Narrative:      ORDER#: [id number]                                    ORDERED BY: Alysia Penna  SOURCE: Urine, Catheterized, Foley                   COLLECTED:  08/24/15 17:18  ANTIBIOTICS AT COLL.:  RECEIVED :  08/24/15  23:42  Culture Urine                              FINAL       08/26/15 15:43   +  08/26/15   >100,000 CFU/ML Enterococcus faecalis      _____________________________________________________________________________                                   E.faecalis     ANTIBIOTICS                     MIC  INTRP      _____________________________________________________________________________  Ampicillin                       1     S        Gentamicin High Level [place] <=500   S        Levofloxacin                    <=1    S        Nitrofurantoin                 <=16    S  D1    Penicillin                       4     S        Streptomycin High Level Resist<=1000   S        Tetracycline                    >8     R        Vancomycin                       2     S          -----DRUG COMMENTS----------    D1:  Nitrofurantoin should only be used for the treatment of         uncomplicated cystitis.         [place] [facility] [employer]committee June 2015  _____________________________________________________________________________            S=SUSCEPTIBLE     I=INTERMEDIATE     R=RESISTANT                            N/S=NON-SUSCEPTIBLE  _____________________________________________________________________________      Wound culture & gram stain [[id number]] Collected:  08/29/15 0200    Specimen Information:  Wound from Wound Updated:  08/31/15 1209    Narrative:      ORDER#: [id number]                                    ORDERED BY: Nicoletta Dress  SOURCE: Wound POSTERIOR CERVICAL WOUND               COLLECTED:  08/29/15 02:00  ANTIBIOTICS AT COLL.:                                RECEIVED :  08/29/15  05:54  Stain, Gram                                FINAL       08/29/15 08:45  08/29/15   No Squamous epithelial cells seen             Rare WBCs             No organisms seen  Culture and Gram Stain, Aerobic, Wound     FINAL       08/31/15 12:09   +  08/31/15   Very light growth of mixed cutaneous flora              Imaging,  reviewed and are significant for:  No results found.        Signed by: Isaias Cowman, MD

## 2015-09-04 NOTE — Progress Notes (Signed)
ID PROGRESS NOTE    Date Time: 09/04/2015 10:01 AM  Patient Name: Ernest Diaz        Subjective, ROS:    Afebrile, hemodynamically stable    On NC    Stable labs     Son at bedside  Plan is PEG placement today    ' i don't feel well '  No fever  No headache  SOB, audile crackles, feels congested  No vomiting  Still weak in the legs      Antibiotics and culture results:   Antibiotics: off antibiotics   Midodrine   S/P vanc , zosyn       Cultures:  3/22 sputum cx GNR, mixed upper flora    3/20 flu negative   3/18 cervical wound cx cut flora  3/18 cervical wound anaerobic cx ngtd  3/18 cervical wound AFB and fungal cx P  3/13 Ucx >100,000 E. Faecalis   3/13 Blood cx Ngtd  3/6 MRSA negative     3/9 path FIBROVASCULAR TISSUE, FEW VESSELS AND BONE CHIPS       Lines: PIV access    Physical Exam:     Filed Vitals:    09/04/15 0800   BP: 108/62   Pulse: 82   Temp: 97.9 F (36.6 C)   Resp: 18   SpO2: 98%       General: in bed, in ICU, more awake and communicative. On NC, in mild distress. Audible crackles. Son at bedside.    HEENT: dry  mucosa , blind in one eye,  pale, not icteric , cervical collar in place, scar not examined , dry mucosa   Chest: S1S2 irregular , crackles bilaterally,  no wheeze , BS at bases decreased.   Abdomen: soft, not tender , BS +  Ext: + edema   Weakness  legs >arms       Labs:       Recent CBC WITH DIFF   Recent Labs      09/04/15   0425   WBC  7.63   RBC  3.34*   HGB  10.7*   HEMATOCRIT  36.3*   MCV  108.7*       Recent CMP   Recent Labs      09/04/15   0425   GLUCOSE  150*   BUN  11.0   CREATININE  0.5*   SODIUM  143   POTASSIUM  4.5   CHLORIDE  102   CO2  37*           Rads:   3/23 sono Similar findings as seen on recent CT. Thick-walled bladder without significant distention with pericholecystic fluid. Lack of gallstones and clear focal tenderness makes cholecystitis less likely. The patient  was uncooperative for this portable examination however. Differential  considerations would include hypoalbuminemia, chronic cholecystitis or acute cholecystitis. If continued clinical concern can perform HIDA scan. Bilateral effusions. Patient refused renal evaluation.    3/22 CT  Decompressed stomach located within the anterior abdomen extending  below the left hepatic lobe margin and above the transverse colon.. Nonspecific gallbladder wall thickening with pericholecystic fluid and stranding in the presence of small ascites. Recommend clinical correlation for acute cholecystitis.. Bilateral small effusions and basilar lower lobe consolidative atelectasis. Cardiac enlargement.    3/22 xray Newly appearing small bilateral pleural effusions with overlying consolidation    3/20 IRSuccessful deployment of a retrievable inferior vena cava filter    3/19 doppler Nonocclusive deep venous thrombosis involving a  short segment of the proximal femoral vein.    3/17 CT  Markedly abnormal examination with anterolisthesis of C6 upon C7  measuring 16 mm with a jumped facet on the left and a comminuted displaced fracture involving the right C7 articular facet. Anterior cervical spinal fusion hardware is malpositioned with both plate and screws extending into the C6-C7 disc space.  The AP dimension of the bony spinal canal at the superior C5 level is reduced to 5 mm.     3/17 xray No fracture acute osseous abnormality identified in the right shoulder, humerus or forearm    3/15 xray Minimal left basilar linear atelectasis or scarring.    3/8 MRI Postsurgical change with prior laminectomy. 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 There findings suspicious for a disc ligamentous injury at this level. Correlation with CT is advised    3/7 IR No angiographic evidence of intracranial aneurysm, arteriovenous malformation or dural fistula. Is no angiographic evidence of spinal arteriovenous malformation or dural fistula.  20-30% stenosis of the cervical left  internal carotid arteries described.    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   - worsening of weakness due to Fracture dislocation C6-C7 with malpositioned hardware  - S/P C5-T1 posterior fusion, C5-C7 anterior fusion with partial C7 corpectomy on 3/18. cx normal flora     Other issues during admission:  Fever/SIRS on 3/12   Symptoms of aspiration with increased cough and mild-mod oropharyngeal dysphagia   Urinary retention, required reinsertion of foley and E. Faecalis UTI  Leukocytosis  DVT (S/P IVC filter)    Hypoxic respiratory failure overnight 3/21-3/22   Gallbladder wall thickening per imaging       Plan:   1. SIRS.   Due to UTI and aspiration pneumonia.  Currently afebrile and overall hemodynamically stable.     2. Hypoxic respiratory failure and aspiration pneumonia.   Due to aspiration/malositioned [place]ak (per xray not in the stomach).   Required BiPAP and NRB but then was refusing and placed on NC.     CT with Bilateral small effusions and basilar lower lobe consolidative atelectasis.  Sputum cx normal flora and GNR.     Would restart zosyn to finish a course from the episode of aspiration.   Follow sputum cx and adjust the regimen.   Pulmonary toilet   Aspiration precaution.   Plan is PEG.     3. E. Faecalis UTI.   Cath related   Foley was removed/then replaced x 2 given retention   Has finished the course.      4. 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   S/P C5-T1 posterior fusion, C5-C7 anterior fusion with partial C7 corpectomy on 3/18. cx normal flora  5. Leukocytosis.   Due to above.   Resolved.    6. Goals of care.   Palliative care on board     Discussed with patient and family  Discussed with RT.   Discussed with Dr. Wynetta Fines.      Signed by: [place]. [id number]  Infectious Disease  Phone: [phone]  Fax: [phone]

## 2015-09-04 NOTE — Plan of Care (Signed)
Problem: Safety  Goal: Patient will be free from injury during hospitalization  Outcome: Progressing  High fall risk, fall mats down, bed in lowest position, fall interventions in place    Problem: Pain  Goal: Patient's pain/discomfort is manageable  Outcome: Progressing  Dilaudid x 1 given     Problem: Potential for Compromised Skin Integrity  Goal: Skin integrity is maintained or improved  Assess and monitor skin integrity. Identify patients at risk for skin breakdown on admission and per policy. Collaborate with interdisciplinary team and initiate plans and interventions as needed.   Outcome: Progressing  q2h turns, mepilex with venelex applied, barrier cream used     Problem: Moderate/High Fall Risk Score >5  Goal: Patient will remain free of falls  Outcome: Progressing  Fall interventions in place    Problem: Neurological Deficit  Goal: Neurological status is stable or improving  Outcome: Progressing  Stable neuro exam, [facility] patient, awaiting bed on [facility] unit, plan for PEG today     Problem: Inadequate Gas Exchange  Goal: Adequately oxygenating and ventilation is improved  Outcome: Progressing  Nasal canula on at 6L, will desat with sudden movements, very resistant to any type of suctioning (oral or NT), lungs dim and rhonchi, nebs used by RT     Problem: Urinary Incontinence  Goal: Perineal Skin Integrity is maintained or improved  Outcome: Progressing  Foley catheter in place for retention, foley care completed

## 2015-09-04 NOTE — Brief Op Note (Signed)
BRIEF OP NOTE    Date Time: 09/04/2015 4:52 PM    Patient Name:   Ernest Diaz    Date of Operation:   09/04/2015    Providers Performing:   Marijean Niemann      Operative Procedure:   Procedure(s):  G,J,G/J TUBE PLACEMENT    Preoperative Diagnosis:   Pre-Op Diagnosis Codes:     * Quadriplegia [G82.50]    Postoperative Diagnosis:   same    Anesthesia:   1% Lidocaine without Epi    Estimated Blood Loss:   1 cc      Findings:   Tip of PGJ in proximal jejunum.    Complications:   None immediate    FLUOROSCOPY:  13.4 min  RADIATION EXPOSURE: 178 mGy  SEDATION:  Deep sedation  CONTRAST MATERIAL: 60 cc omnipaque    Signed by: Marijean Niemann, MD                                                                             [facility] CARDIAC CATH

## 2015-09-05 ENCOUNTER — Inpatient Hospital Stay: Payer: Medicare Other

## 2015-09-05 DIAGNOSIS — J690 Pneumonitis due to inhalation of food and vomit: Secondary | ICD-10-CM

## 2015-09-05 DIAGNOSIS — G9340 Encephalopathy, unspecified: Secondary | ICD-10-CM

## 2015-09-05 LAB — BLOOD GAS, ARTERIAL
Arterial Total CO2: 36.5 mEq/L — ABNORMAL HIGH (ref 24.0–30.0)
Base Excess, Arterial: 10.7 mEq/L — ABNORMAL HIGH (ref <=2.0)
FIO2: 40 %
HCO3, Arterial: 35.1 mEq/L — ABNORMAL HIGH (ref 23.0–29.0)
O2 Sat, Arterial: 99.5 % (ref 95.0–100.0)
PEEP: 5
Pressure Support: 5
Temperature: 37.3
pCO2, Arterial: 46.9 mmhg — ABNORMAL HIGH (ref 35.0–45.0)
pH, Arterial: 7.488 — ABNORMAL HIGH (ref 7.350–7.450)
pO2, Arterial: 127 mmhg — ABNORMAL HIGH (ref 80.0–90.0)

## 2015-09-05 LAB — CBC AND DIFFERENTIAL
Basophils Absolute Automated: 0 10*3/uL (ref 0.00–0.20)
Basophils Automated: 0 %
Eosinophils Absolute Automated: 0 10*3/uL (ref 0.00–0.70)
Eosinophils Automated: 0 %
Hematocrit: 33 % — ABNORMAL LOW (ref 42.0–52.0)
Hgb: 10.1 g/dL — ABNORMAL LOW (ref 13.0–17.0)
Immature Granulocytes Absolute: 0.01 10*3/uL
Immature Granulocytes: 0 %
Lymphocytes Absolute Automated: 0.65 10*3/uL (ref 0.50–4.40)
Lymphocytes Automated: 11 %
MCH: 31.7 pg (ref 28.0–32.0)
MCHC: 30.6 g/dL — ABNORMAL LOW (ref 32.0–36.0)
MCV: 103.4 fL — ABNORMAL HIGH (ref 80.0–100.0)
MPV: 10.4 fL (ref 9.4–12.3)
Monocytes Absolute Automated: 0.42 10*3/uL (ref 0.00–1.20)
Monocytes: 7 %
Neutrophils Absolute: 5.07 10*3/uL (ref 1.80–8.10)
Neutrophils: 82 %
Nucleated RBC: 0 /100 WBC (ref 0–1)
Platelets: 221 10*3/uL (ref 140–400)
RBC: 3.19 10*6/uL — ABNORMAL LOW (ref 4.70–6.00)
RDW: 13 % (ref 12–15)
WBC: 6.15 10*3/uL (ref 3.50–10.80)

## 2015-09-05 LAB — GLUCOSE WHOLE BLOOD - POCT
Whole Blood Glucose POCT: 103 mg/dL — ABNORMAL HIGH (ref 70–100)
Whole Blood Glucose POCT: 99 mg/dL (ref 70–100)
Whole Blood Glucose POCT: 103 mg/dL — ABNORMAL HIGH (ref 70–100)

## 2015-09-05 LAB — BASIC METABOLIC PANEL
BUN: 12 mg/dL (ref 9.0–28.0)
CO2: 34 mEq/L — ABNORMAL HIGH (ref 22–29)
Calcium: 8.3 mg/dL (ref 7.9–10.2)
Chloride: 101 mEq/L (ref 100–111)
Creatinine: 0.5 mg/dL — ABNORMAL LOW (ref 0.7–1.3)
Glucose: 105 mg/dL — ABNORMAL HIGH (ref 70–100)
Potassium: 3.6 mEq/L (ref 3.5–5.1)
Sodium: 143 mEq/L (ref 136–145)

## 2015-09-05 LAB — GFR: EGFR: >60

## 2015-09-05 MED ORDER — OXYCODONE HCL 5 MG PO TABS
5.0000 mg | ORAL_TABLET | ORAL | Status: DC | PRN
Start: 2015-09-05 — End: 2015-09-07
  Administered 2015-09-05 – 2015-09-06 (×2): 5 mg via ORAL
  Filled 2015-09-05: qty 1

## 2015-09-05 MED ORDER — ENOXAPARIN SODIUM 60 MG/0.6ML SC SOLN
60.0000 mg | Freq: Every day | SUBCUTANEOUS | Status: DC
Start: 2015-09-06 — End: 2015-09-05

## 2015-09-05 MED ORDER — ENOXAPARIN SODIUM 40 MG/0.4ML SC SOLN
40.0000 mg | Freq: Every day | SUBCUTANEOUS | Status: DC
Start: 2015-09-06 — End: 2015-09-13
  Administered 2015-09-06 – 2015-09-13 (×8): 40 mg via SUBCUTANEOUS
  Filled 2015-09-05 (×7): qty 0.4

## 2015-09-05 MED ORDER — ENOXAPARIN SODIUM 40 MG/0.4ML SC SOLN
40.0000 mg | Freq: Every day | SUBCUTANEOUS | Status: DC
Start: 2015-09-05 — End: 2015-09-05

## 2015-09-05 MED ORDER — FUROSEMIDE 10 MG/ML IJ SOLN
20.0000 mg | Freq: Two times a day (BID) | INTRAMUSCULAR | Status: DC
Start: 2015-09-05 — End: 2015-09-05

## 2015-09-05 MED ORDER — LEVOFLOXACIN IN D5W 750 MG/150ML IV SOLN
750.0000 mg | INTRAVENOUS | Status: AC
Start: 2015-09-05 — End: 2015-09-09
  Administered 2015-09-05 – 2015-09-09 (×5): 750 mg via INTRAVENOUS
  Filled 2015-09-05 (×5): qty 150

## 2015-09-05 MED ORDER — ACETAMINOPHEN 325 MG PO TABS
650.0000 mg | ORAL_TABLET | Freq: Once | ORAL | Status: AC
Start: 2015-09-05 — End: 2015-09-05
  Administered 2015-09-05: 650 mg via ORAL

## 2015-09-05 MED ORDER — FUROSEMIDE 10 MG/ML IJ SOLN
20.0000 mg | Freq: Two times a day (BID) | INTRAMUSCULAR | Status: AC
Start: 2015-09-05 — End: 2015-09-06
  Administered 2015-09-05 – 2015-09-06 (×2): 20 mg via INTRAVENOUS
  Filled 2015-09-05 (×2): qty 4

## 2015-09-05 NOTE — Plan of Care (Signed)
Patient s/p GJ tube placement on 3/24. Abdomen soft, ND, +BS. No gastric emptying bag attached to g portion of tube, Dr. Jaynie Collins was alerted and OK with this. 2 t-fasteners were removed at the bedside. Catheter was flushed with 10cc sterile water without leakage, resistance. Cleared by IR for usage.

## 2015-09-05 NOTE — Consults (Signed)
NUTRITION:    Reason for Assessment: s/p GJ tube placement.    Recommend:    Without propofol Rec 2 Cal HN @ 45 ml/hr x 24 hours with  2 packets Prosource BID  @ 10 am and 8 pm (1080 ml final volume formula / 936 ml free water / 2320 kcals /  gm pro)    Flush with min 30 ml free water every 8 hours for tube patency and additional free water as per hydration needs; IVF status and I/O's.      Clinical Update:  S/P GJ tube. Extr:+3 edema  Acute respiratory failure: multifactorial - aspiration pneumonia/atelectasis; fluid overload, SBT as tolerated. Encephalopathy post anesthesia. Traumatic cervicothoracic epidural hematoma s/p C3-C5 + T3-T4 decompressive laminectomies 2/27 . S/p G tube - NPO for possible extubation.    Labs: Alb 2.2, Glucose 105     Meds: cardura, pepcid, florinef, lasix, levaquin, midodrine, pericolace  Propofol @ 17.4 ml/hr (   Nutrition Goals: 1950-2340 kcals ( 25-30 kcals/kg IBW); 112-130 gm pro (1.2-1.4 gm pro/kg); per MD discretion 2/2 to edema.    Diet / Nutrition Support Order: NPO    Monitor/Eval:  Monitor nutr support goals, labs, GI, med tx plan    Madison Hickman,  MS RD CDE CSO  [phone]

## 2015-09-05 NOTE — Anesthesia Postprocedure Evaluation (Signed)
Anesthesia Post Evaluation    Patient: Ernest Diaz    Procedure(s):  G,J,G/J TUBE PLACEMENT    Anesthesia type: general    Last Vitals:   Filed Vitals:    09/05/15 1231   BP:    Pulse:    Temp:    Resp:    SpO2: 100%       Patient Location: ICU      Post Pain: Patient not complaining of pain, continue current therapy    Mental Status: sedated    Respiratory Function: intubated    Cardiovascular: stable    Nausea/Vomiting: patient not complaining of nausea or vomiting    Hydration Status: adequate    Post Assessment: no apparent anesthetic complications and no reportable events          Anesthesia Qualified Clinical Data Registry    Central Line      CVC insertion : NO                                               Perioperative temperature management      General/neuraxial anesthesia > or = 60 minutes (excluding CABG) : YES              > Use of intraoperative active warming : YES              > Temperature > or = 36 degrees Centigrade (96.8 degrees Farenheit) during time span from 30 minutes before up to 15 minutes after anesthesia end time : YES      Administration of antibiotic prophylaxis      Age > or = 18, with IV access, with surgical procedure for which antibiotic prophylaxis indicated, and not on chronic antibiotics : YES              > Prophylactic antibiotics within 1 hour of incision (or fluroroquinolone/vancomycin within 2 hours of incision) : YES    Medication Administration      Ordering or administration of drug inconsistent with intended drug, dose, delivery or timing : NO      Dental loss/damage      Dental injury with administration of anesthesia : NO      Difficult intubation due to unrecognized difficult airway        Elective airway procedure including but not limited to: tracheostomy, fiberoptic bronchoscopy, rigid bronchoscopy; jet ventilation; or elective use of a device to facilitate airway management such as a Glidescope : NO                > Unanticipated difficult  intubation post pre-evaluation : NO      Aspiration of gastric contents        Aspiration of gastric contents : NO                    Surgical fire        Procedure requiring electrocautery/laser : YES                > Ignition/burning in invasive procedure location : NO      Immediate perioperative cardiac arrest        Cardiac arrest in OR or PACU : NO                    Unplanned hospital admission  Unplanned hospital admission for initially intended outpatient anesthesia service : NO      Unplanned ICU admission        Unplanned ICU admission related to anesthesia occurring within 24 hours of induction or start of MAC : NO      Surgical case cancellation        Cancellation of procedure after care already started by anesthesia care team : NO      Post-anesthesia transfer of care checklist/protocol to PACU        Transfer from OR to PACU upon case conclusion : YES              > Use of PACU transfer checklist/protocol : YES     (Includes the key elements of: patient identification, responsible practitioner identification (PACU nurse or advanced practitioner), discussion of pertinent history and procedure course, intraoperative anesthetic management, post-procedure plans, acknowledgement/questions)    Post-anesthesia transfer of care checklist/protocol to ICU        Transfer from OR to ICU upon case conclusion : NO                    Post-operative nausea/vomiting risk protocol        Post-operative nausea/vomiting risk protocol : YES  Patient > or = 18 with care initiated by anesthesia team that has a risk factor screen for post-op nausea/vomiting (Includes male, hx PONV, or motion sickness, non-smoker, intended opioid administration for post-op analgesia.)    Anaphylaxis        Anaphylaxis during anesthesia services : NO    (Inclusive of any suspected transfusion reaction in association with blood-bank confirmed blood product incompatibility)              Gery Pray, 09/05/2015 1:15 PM

## 2015-09-05 NOTE — Progress Notes (Signed)
NSICU Attending Progress Note    24 h events: s/p G tube, intubated post procedure due to hypoxia. On PRVC o/n, passed CPAP in am. Afebrile, stable SBP. Large amount of ETT secretions - thick; q 1h suctioning.    VS reviewed.  Neuro: opens ayes spnt, not FC, L pupil 3 mm, brisk, + corneals, weak cough and gag. UE spont AG; TF LE.  HEENT: intubated  CV: reg S1S2  Lungs: ronchi.  ABd: soft, + BS  Extr:+3 e; no c/c; SCDs    Meds, lab sand imagistic studies reviewed.    A/P:  1: Acute respiratory failure: multifactorial - aspiration pneumonia/atelectasis; fluid overload, decrease FRC form prior spine surgery, encephalopathy.  - pneumonia - Stenotrophomonas ; change Zosyn to Levaquin.  - continue diuresis; increase to q 12h.  - SBT as tolerated  - chest PT  - CxR with bibasilar infiltrate. Check ABG.  2: Encephalopathy: postanethesia, toxico-metabolic; unlikely seizures. D/c Seroquel  3: Traumatic cervicothoracic epidural hematoma s/p C3-C5 + T3-T4 decompressive laminectomies 2/27   - NS f/u  4: S/p G tube - NPO for possible extubation.  5: ID - Stenotrophomonas pneumonia.   E fecalis UTI - treated.   6: Urinary retention - continue Foley, needs Foley for strict i/os.  7: Continue Midodrine and Florinef. D/c IVF.  8: GI/DVT prophylaxis: on Pepcid; start LMWH.  9: Afib: rate controlled; off AC.    CCM time x 41 min, not overlapping with other providers.    Leonette Monarch MD  09/05/2015 11:27 AM

## 2015-09-05 NOTE — Progress Notes (Signed)
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   - worsening of weakness due to Fracture dislocation C6-C7 with malpositioned hardware  - S/P C5-T1 posterior fusion, C5-C7 anterior fusion with partial C7 corpectomy on 3/18. cx normal flora     Other issues during admission:  Fever/SIRS on 3/12   Symptoms of aspiration with increased cough and mild-mod oropharyngeal dysphagia   Urinary retention, required reinsertion of foley and E. Faecalis UTI  Leukocytosis  DVT (S/P IVC filter)    Hypoxic respiratory failure overnight 3/21-3/22   Resp culture with Stenotrophomonas  Gallbladder wall thickening per imaging     S/p GJ tube 3/24  Extubated 3/25    Plan:    Aspiration pneumonia, hypoxia  Now on levofloxacin for Steno    Discussed with patient and family    [place] [place]  [facility]   [phone]    Subjective:    Afebrile with max 99.4  Now extubated  Tolerating antibiotics without itching, rash, nausea, vomiting, or diarrhea    Review of Systems:    No fevers, chills, headache, vision changes, nausea, vomiting, abdominal pain, diarrhea, chest pain, sob, arthralgias, myalgias, rashes, dysuria, weakness    Medications:     Current Facility-Administered Medications   Medication Dose Route Frequency   . balsam peru-castor oil (VENELEX)   Topical Q12H SCH   . doxazosin  1 mg Oral Once    Followed by   . doxazosin  2 mg Oral QHS   . [START ON 09/06/2015] enoxaparin  40 mg  Subcutaneous Daily   . famotidine  20 mg Oral Q12H SCH    Or   . famotidine  20 mg Intravenous Q12H SCH   . fludrocortisone  0.2 mg Oral Daily   . furosemide  20 mg Intravenous Q12H   . levoFLOXacin  750 mg Intravenous Q24H SCH   . midodrine  10 mg Oral TID MEALS   . polyethylene glycol  17 g Oral Daily   . QUEtiapine  25 mg Oral QHS   . senna-docusate  2 tablet Oral TID       Physical Exam:      Filed Vitals:    09/05/15 1400   BP: 147/76   Pulse: 77   Temp:    Resp: 25   SpO2: 98%     Vitals reviewed.     GEN: NAD.  NECK: supple  LYMPH: No cervical or supraclavicular LAD  HEENT: anicteric; c collar  CV: normal S1S2, RR, no MGR  PULM: CTAB, no wheezing; extubated on NC  ABD: NABS, soft, nontender, nondistended  EXT: no edema BLE  DERM: No rashes     Lines:    Central Line: no  PIV without erythema or tenderness    Microbiology:    3/22 sputum cx mixed upper flora, Stenotrophomonas   3/20 flu negative   3/18 cervical wound cx cut  flora  3/18 cervical wound anaerobic cx ngtd  3/18 cervical wound AFB and fungal cx P  3/13 Ucx >100,000 E. Faecalis   3/13 Blood cx Ngtd  3/6 MRSA negative    All microbiology results reviewed.     Labs:      Recent Labs      09/05/15   0347   WBC  6.15   HGB  10.1*   HEMATOCRIT  33.0*   PLATELETS  221         Recent CMP   Recent Labs      09/05/15   0347   GLUCOSE  105*   BUN  12.0   CREATININE  0.5*   SODIUM  143   POTASSIUM  3.6   CHLORIDE  101   CO2  34*       Lab results reviewed.    Radiology:      Radiology Results (24 Hour)     Procedure Component Value Units Date/Time    XR Chest AP Portable [[id number]] Collected:  09/05/15 0858    Order Status:  Completed Updated:  09/05/15 0903    Narrative:      CLINICAL HISTORY: Evaluate for pneumonia in volume status    AP portable view of the chest has been submitted and compared to the  prior study dated one day earlier.    Stable mild cardiomegaly, pulmonary vascular congestion and bibasilar  moderate pleural effusions with bibasilar  atelectasis and/or  consolidations. No significant pneumothorax. Hardware is noted in the  cervical spine. Lung apices remain aerated.      Impression:        Stable exam. Cardiomegaly with moderate bilateral pleural effusions and  bibasilar atelectasis and/or consolidation.    Latanya Maudlin, MD   09/05/2015 8:59 AM      XR Chest AP Portable [[id number]] Collected:  09/04/15 1926    Order Status:  Completed Updated:  09/04/15 1939    Narrative:      HISTORY:  Pulmonary effusions.    COMPARISON: 09/02/2015.    Portable chest: Cervical fusion hardware is noted. An ET tube projects  approximately 6 cm above the carina. The heart is enlarged.  The aorta  is unfolded. There is bibasilar atelectasis and effusions, right greater  than left. No pneumothorax is seen.. There is no infiltrate or edema.           Impression:       Cardiomegaly, bilateral atelectasis and effusions.    Geanie Cooley, MD   09/04/2015 7:35 PM      G,J,G/J Tube Placement [[id number]] Collected:  09/04/15 1833    Order Status:  Completed Updated:  09/04/15 1843    Narrative:      HISTORY: 70 yo man with multiple medical problems to include  quadriplegia and dysphagia. Plan image guided PGJ placement.    ANESTHESIA:  1% lidocaine without epinephrine.    SEDATION: Deep sedation.    CONTRAST: Omnipaque  60 mL.    RADIATION DOSAGE: Fluoro time 13.4 minutes. Air Kerma 170 mGy.    COMPLICATIONS: None.    Procedure:   1.  Placement of a  53 French Percutaneous Gastro-jejunostomy catheter  under fluoroscopic guidance.    Procedure in detail: The procedure, risks and possible  complications were explained and an informed consent was obtained. The  patient was placed supine on the angiographic table. A scout film of  upper abdomen was obtained.    Patient was given intravenous deep  sedation by the anesthesiology  service. Patient?s vital signs were monitored throughout the procedure  by a qualified Radiology Nurse.     Through the naso-gastric catheter, air was  insufflated and an  appropriate access site was marked over the skin to access the stomach.  2 T-fasteners were then placed in a sequential fashion. The marked site  was infiltrated with 1 % local lidocaine anesthesia and small skin  incision was made. An 5 [place] Yueh sheath/needle was advanced into the  stomach lumen under fluoroscopic guidance. The needle entry was  documented. With needle tip within the gastric lumen, the needle was  removed and intra-luminal position of the sheath was confirmed by air  aspiration and contrast injection.     A 0.035 Bentson wire was advanced through the sheath. Using a guidewire?  MPA catheter combination, the glide wire could be advanced beyond the  ligament of Trietz. The Bentson wire was exchanged for an Amplatz wire.  The catheter was removed, followed by advancement of peel-away  sheath-dilator combination over the wire. The dilator was removed,  followed by advancement of a 18 French gastro-jejunostomy catheter over  the wire and through the peel-away sheath. The peel-away and wire was  then removed. Hand injection of contrast through the gastro-jejunostomy  catheter confirmed the catheter tip in the proximal jejunum. Injection  of the gastric port demonstrated free flow of contrast material in the  stomach.     The retention balloon was inflated with 8 mL normal saline. The catheter  was gently pulled back to allow the retention balloon to lie against the  anterior gastric wall.  The flange was advanced to the skin. Contrast  was injected through each lumen under fluoroscopic guidance  demonstrating satisfactory catheter position.  Fluoroscopic images were  obtained for documentation.  A sterile dressing was applied.    Sterile dressing was applied.    The patient tolerated the procedure well was transferred in stable  condition. There were no immediate complications associated with the  procedure.     Findings: A scout film of the upper abdomen demonstrated enteric  tube  with tip in the body of the stomach. Incidental note made of an IVC  filter..    Later images demonstrate the new 64 French PGJ in place with distal  tip in the proximal jejunum. Contrast material documents flow of  contrast material within the lumen of the stomach.    Impression: Percutaneous placement of a 95 French percutaneous  Gastro-jejunostomy catheter under fluoroscopic guidance, as described  above.     Note: The catheter cannot be used until cleared by vascular  interventional radiology on 09/05/2015. When use is initiated, flush the  catheter with 10 cc water once a day and avoid crushed pills to maintain  patency (Please contact pharmacy for better formulation, if any pills  need to be administered through the catheter).    If the patient develops abdominal pain or signs of peritonitis, stop  feeding and consult vascular interventional radiology immediately.    Marijean Niemann, MD   09/04/2015 6:39 PM            All radiological procedures reviewed.

## 2015-09-05 NOTE — Plan of Care (Signed)
Problem: Safety  Goal: Patient will be free from injury during hospitalization  Outcome: Progressing  High fall risk, fall interventions in place    Problem: Pain  Goal: Patient's pain/discomfort is manageable  Outcome: Progressing  CPOT scores low, adequately sedated, on propofol     Problem: Potential for Compromised Skin Integrity  Goal: Skin integrity is maintained or improved  Assess and monitor skin integrity. Identify patients at risk for skin breakdown on admission and per policy. Collaborate with interdisciplinary team and initiate plans and interventions as needed.   Outcome: Progressing  Pressure ulcer on right buttocks, q2h turns, venelex and mepilex applied     Problem: Moderate/High Fall Risk Score >5  Goal: Patient will remain free of falls  Outcome: Progressing    Problem: Neurological Deficit  Goal: Neurological status is stable or improving  Outcome: Progressing  Stable neuro exam, c-collar on, sedation held for 10 min moves extremities to pain, not following commands     Problem: Inadequate Gas Exchange  Goal: Adequately oxygenating and ventilation is improved  Outcome: Progressing  Remains intubated, suctioned at least q2h, copious secretions orally, moderate secretions in-line, lungs rhonchi and dim, IPV and nebs continued     Problem: Urinary Incontinence  Goal: Perineal Skin Integrity is maintained or improved  Outcome: Progressing  Foley catheter for retention, foley care completed

## 2015-09-06 ENCOUNTER — Inpatient Hospital Stay: Payer: Medicare Other

## 2015-09-06 ENCOUNTER — Encounter: Payer: Self-pay | Admitting: Neurological Surgery

## 2015-09-06 LAB — BASIC METABOLIC PANEL
BUN: 12 mg/dL (ref 9.0–28.0)
CO2: 38 mEq/L — ABNORMAL HIGH (ref 22–29)
Calcium: 8 mg/dL (ref 7.9–10.2)
Chloride: 98 mEq/L — ABNORMAL LOW (ref 100–111)
Creatinine: 0.4 mg/dL — ABNORMAL LOW (ref 0.7–1.3)
Glucose: 93 mg/dL (ref 70–100)
Potassium: 3.5 mEq/L (ref 3.5–5.1)
Sodium: 143 mEq/L (ref 136–145)

## 2015-09-06 LAB — CBC AND DIFFERENTIAL
Basophils Absolute Automated: 0 10*3/uL (ref 0.00–0.20)
Basophils Automated: 0 %
Eosinophils Absolute Automated: 0.03 10*3/uL (ref 0.00–0.70)
Eosinophils Automated: 0 %
Hematocrit: 34.4 % — ABNORMAL LOW (ref 42.0–52.0)
Hgb: 10.4 g/dL — ABNORMAL LOW (ref 13.0–17.0)
Immature Granulocytes Absolute: 0.02 10*3/uL
Immature Granulocytes: 0 %
Lymphocytes Absolute Automated: 0.91 10*3/uL (ref 0.50–4.40)
Lymphocytes Automated: 14 %
MCH: 31.1 pg (ref 28.0–32.0)
MCHC: 30.2 g/dL — ABNORMAL LOW (ref 32.0–36.0)
MCV: 103 fL — ABNORMAL HIGH (ref 80.0–100.0)
MPV: 10.4 fL (ref 9.4–12.3)
Monocytes Absolute Automated: 0.5 10*3/uL (ref 0.00–1.20)
Monocytes: 8 %
Neutrophils Absolute: 5.03 10*3/uL (ref 1.80–8.10)
Neutrophils: 78 %
Nucleated RBC: 0 /100 WBC (ref 0–1)
Platelets: 200 10*3/uL (ref 140–400)
RBC: 3.34 10*6/uL — ABNORMAL LOW (ref 4.70–6.00)
RDW: 14 % (ref 12–15)
WBC: 6.49 10*3/uL (ref 3.50–10.80)

## 2015-09-06 LAB — BLOOD GAS, ARTERIAL
Arterial Total CO2: 40.5 mEq/L — ABNORMAL HIGH (ref 24.0–30.0)
Base Excess, Arterial: 11.3 mEq/L — ABNORMAL HIGH (ref <=2.0)
HCO3, Arterial: 38.4 mEq/L — ABNORMAL HIGH (ref 23.0–29.0)
O2 Sat, Arterial: 100 % (ref 95.0–100.0)
Temperature: 37
pCO2, Arterial: 69.7 mmhg — ABNORMAL HIGH (ref 35.0–45.0)
pH, Arterial: 7.36 (ref 7.350–7.450)
pO2, Arterial: 187 mmhg — ABNORMAL HIGH (ref 80.0–90.0)

## 2015-09-06 LAB — GLUCOSE WHOLE BLOOD - POCT
Whole Blood Glucose POCT: 110 mg/dL — ABNORMAL HIGH (ref 70–100)
Whole Blood Glucose POCT: 133 mg/dL — ABNORMAL HIGH (ref 70–100)

## 2015-09-06 LAB — GFR: EGFR: >60

## 2015-09-06 MED ORDER — POTASSIUM CHLORIDE 20 MEQ PO PACK
40.0000 meq | PACK | Freq: Once | ORAL | Status: AC
Start: 2015-09-06 — End: 2015-09-06
  Administered 2015-09-06: 40 meq via ORAL
  Filled 2015-09-06: qty 2

## 2015-09-06 MED ORDER — FUROSEMIDE 10 MG/ML IJ SOLN
20.0000 mg | Freq: Two times a day (BID) | INTRAMUSCULAR | Status: AC
Start: 2015-09-06 — End: 2015-09-06
  Administered 2015-09-06 (×2): 20 mg via INTRAVENOUS
  Filled 2015-09-06 (×2): qty 4

## 2015-09-06 NOTE — Progress Notes (Signed)
NSICU Attending Progress Note    24 h events: extubated yesterday. Code status changed to full code. Afebrile. Desated o/n with laying flat.    VS reviewed.  Neuro: opens eyes spont, Ox1, FC, L pupil 3 mm, brisk, R pupil ost surgical, + corneals, FS, TM, weak cough and gag. UE spont AG; TF LE.  HEENT: intubated  CV: reg S1S2  Lungs: ronchi.  ABd: soft, + BS  Extr:+2 eLE; no c/c; SCDs    Meds, lab sand imagistic studies reviewed.    A/P:  1: Acute respiratory failure: with hypoxia and hypercapnia, improved, extubated yesterday; still with occasional desats with lying flat  - aspiration pneumonia/atelectasis; fluid overload, decrease FRC form prior spine surgery.  - pneumonia - Stenotrophomonas ; cont Levaquin x 5 days; stop date 3/29.  - diuresis q 12h.  - IPV  - CxR with bibasilar infiltrate. Check ABG.  2: Encephalopathy: resolved; off Seroquel  3: Traumatic cervicothoracic epidural hematoma s/p C3-C5 + T3-T4 decompressive laminectomies 2/27   - NS f/u  4: S/p G tube - start TF. Avoid constipation.  5: ID - Stenotrophomonas pneumonia. On abx.  E fecalis UTI - treated.   6: Urinary retention - d/c Foley; place external catheter and continue with strict i/o s.  7: Hypotension - chronic, in the setting of dysautonomia. Continue Midodrine and Florinef. Continue diuresis as tolerated by BP.  8: GI/DVT prophylaxis: on Pepcid; LMWH.  9: Afib: rate controlled; off AC.  10: Will transfer to the [facility] if ABG OK and tolerates diuresis.    CCM time x 35 min, not overlapping with other providers.    Leonette Monarch MD  09/06/2015 8:47 AM

## 2015-09-06 NOTE — Progress Notes (Signed)
Neurosurgery Progress Note  HD 8  S:   Complaining of being thirsty    O:   Filed Vitals:    09/06/15 0800   BP: 95/51   Pulse: 74   Temp:    Resp: 19   SpO2: 100%         PHYSICAL EXAM:  Awake  Voice stronger  Follows commands with antigravity strength in both arms  Wiggles toe on R  Incision clear        A/P:   Neuro without change  Will check surveillance cervical xrays  OOB as tolerated   Keep Aspen on      Rella Larve, MD

## 2015-09-07 ENCOUNTER — Inpatient Hospital Stay: Payer: Medicare Other

## 2015-09-07 DIAGNOSIS — J9811 Atelectasis: Secondary | ICD-10-CM

## 2015-09-07 DIAGNOSIS — T17500A Unspecified foreign body in bronchus causing asphyxiation, initial encounter: Secondary | ICD-10-CM | POA: Insufficient documentation

## 2015-09-07 DIAGNOSIS — G9340 Encephalopathy, unspecified: Secondary | ICD-10-CM | POA: Insufficient documentation

## 2015-09-07 DIAGNOSIS — T17990A Other foreign object in respiratory tract, part unspecified in causing asphyxiation, initial encounter: Secondary | ICD-10-CM

## 2015-09-07 DIAGNOSIS — I482 Chronic atrial fibrillation, unspecified: Secondary | ICD-10-CM | POA: Insufficient documentation

## 2015-09-07 DIAGNOSIS — J690 Pneumonitis due to inhalation of food and vomit: Secondary | ICD-10-CM | POA: Insufficient documentation

## 2015-09-07 LAB — BLOOD GAS, ARTERIAL
Arterial Total CO2: 41.1 mEq/L — ABNORMAL HIGH (ref 24.0–30.0)
Arterial Total CO2: 39.4 mEq/L — ABNORMAL HIGH (ref 24.0–30.0)
Base Excess, Arterial: 13.1 mEq/L — ABNORMAL HIGH (ref <=2.0)
Base Excess, Arterial: 13.7 mEq/L — ABNORMAL HIGH (ref <=2.0)
FIO2: 40 %
FIO2: 100 %
HCO3, Arterial: 39.3 mEq/L — ABNORMAL HIGH (ref 23.0–29.0)
HCO3, Arterial: 38.1 mEq/L — ABNORMAL HIGH (ref 23.0–29.0)
O2 Sat, Arterial: 99.9 % (ref 95.0–100.0)
O2 Sat, Arterial: >100 % (ref 95.0–100.0)
PEEP: 5
Rate: 12
Temperature: 37.3
Temperature: 37.3
Tidal vol.: 600
pCO2, Arterial: 61.8 mmhg — ABNORMAL HIGH (ref 35.0–45.0)
pCO2, Arterial: 45.7 mmhg — ABNORMAL HIGH (ref 35.0–45.0)
pH, Arterial: 7.421 (ref 7.350–7.450)
pH, Arterial: 7.532 — ABNORMAL HIGH (ref 7.350–7.450)
pO2, Arterial: 161 mmhg — ABNORMAL HIGH (ref 80.0–90.0)
pO2, Arterial: 230 mmhg — ABNORMAL HIGH (ref 80.0–90.0)

## 2015-09-07 LAB — CBC AND DIFFERENTIAL
Basophils Absolute Automated: 0 10*3/uL (ref 0.00–0.20)
Basophils Automated: 0 %
Eosinophils Absolute Automated: 0.02 10*3/uL (ref 0.00–0.70)
Eosinophils Automated: 0 %
Hematocrit: 33.3 % — ABNORMAL LOW (ref 42.0–52.0)
Hgb: 10.4 g/dL — ABNORMAL LOW (ref 13.0–17.0)
Immature Granulocytes Absolute: 0.05 10*3/uL
Immature Granulocytes: 1 %
Lymphocytes Absolute Automated: 0.65 10*3/uL (ref 0.50–4.40)
Lymphocytes Automated: 11 %
MCH: 31.5 pg (ref 28.0–32.0)
MCHC: 31.2 g/dL — ABNORMAL LOW (ref 32.0–36.0)
MCV: 100.9 fL — ABNORMAL HIGH (ref 80.0–100.0)
MPV: 10.3 fL (ref 9.4–12.3)
Monocytes Absolute Automated: 0.53 10*3/uL (ref 0.00–1.20)
Monocytes: 9 %
Neutrophils Absolute: 4.75 10*3/uL (ref 1.80–8.10)
Neutrophils: 79 %
Nucleated RBC: 0 /100 WBC (ref 0–1)
Platelets: 240 10*3/uL (ref 140–400)
RBC: 3.3 10*6/uL — ABNORMAL LOW (ref 4.70–6.00)
RDW: 14 % (ref 12–15)
WBC: 6 10*3/uL (ref 3.50–10.80)

## 2015-09-07 LAB — BASIC METABOLIC PANEL
BUN: 13 mg/dL (ref 9.0–28.0)
CO2: 40 mEq/L — ABNORMAL HIGH (ref 22–29)
Calcium: 8.2 mg/dL (ref 7.9–10.2)
Chloride: 100 mEq/L (ref 100–111)
Creatinine: 0.5 mg/dL — ABNORMAL LOW (ref 0.7–1.3)
Glucose: 153 mg/dL — ABNORMAL HIGH (ref 70–100)
Potassium: 3.4 mEq/L — ABNORMAL LOW (ref 3.5–5.1)
Sodium: 145 mEq/L (ref 136–145)

## 2015-09-07 LAB — GFR: EGFR: >60

## 2015-09-07 LAB — GLUCOSE WHOLE BLOOD - POCT
Whole Blood Glucose POCT: 171 mg/dL — ABNORMAL HIGH (ref 70–100)
Whole Blood Glucose POCT: 115 mg/dL — ABNORMAL HIGH (ref 70–100)
Whole Blood Glucose POCT: 144 mg/dL — ABNORMAL HIGH (ref 70–100)

## 2015-09-07 MED ORDER — ROCURONIUM BROMIDE 50 MG/5ML IV SOLN
INTRAVENOUS | Status: AC
Start: 2015-09-07 — End: 2015-09-07
  Administered 2015-09-07: 100 mg
  Filled 2015-09-07: qty 10

## 2015-09-07 MED ORDER — ETOMIDATE 2 MG/ML IV SOLN
INTRAVENOUS | Status: AC
Start: 2015-09-07 — End: 2015-09-07
  Administered 2015-09-07: 30 mg
  Filled 2015-09-07: qty 20

## 2015-09-07 MED ORDER — FENTANYL CITRATE (PF) 50 MCG/ML IJ SOLN (WRAP)
50.0000 ug | Freq: Once | INTRAMUSCULAR | Status: DC
Start: 2015-09-07 — End: 2015-09-08
  Filled 2015-09-07: qty 6

## 2015-09-07 MED ORDER — ONDANSETRON HCL 4 MG/2ML IJ SOLN
4.0000 mg | Freq: Once | INTRAMUSCULAR | Status: DC | PRN
Start: 2015-09-07 — End: 2015-09-07

## 2015-09-07 MED ORDER — DIPHENHYDRAMINE HCL 50 MG/ML IJ SOLN
12.5000 mg | Freq: Four times a day (QID) | INTRAMUSCULAR | Status: DC | PRN
Start: 2015-09-07 — End: 2015-09-07

## 2015-09-07 MED ORDER — PROMETHAZINE HCL 25 MG/ML IJ SOLN
12.5000 mg | Freq: Once | INTRAMUSCULAR | Status: DC | PRN
Start: 2015-09-07 — End: 2015-09-07

## 2015-09-07 MED ORDER — LABETALOL HCL 5 MG/ML IV SOLN
20.0000 mg | Freq: Once | INTRAVENOUS | Status: DC | PRN
Start: 2015-09-07 — End: 2015-09-07

## 2015-09-07 MED ORDER — HYDROMORPHONE HCL 1 MG/ML IJ SOLN
0.5000 mg | INTRAMUSCULAR | Status: DC | PRN
Start: 2015-09-07 — End: 2015-09-07

## 2015-09-07 MED ORDER — MEPERIDINE HCL 25 MG/ML IJ SOLN
25.0000 mg | Freq: Once | INTRAMUSCULAR | Status: DC | PRN
Start: 2015-09-07 — End: 2015-09-07

## 2015-09-07 MED ORDER — ETOMIDATE 2 MG/ML IV SOLN
INTRAVENOUS | Status: AC
Start: 2015-09-07 — End: 2015-09-07
  Filled 2015-09-07: qty 20

## 2015-09-07 MED ORDER — ROCURONIUM BROMIDE 10 MG/ML IV SOLN (WRAP)
1.0000 mg/kg | Freq: Once | INTRAVENOUS | Status: DC
Start: 2015-09-07 — End: 2015-09-08

## 2015-09-07 MED ORDER — ETOMIDATE 2 MG/ML IV SOLN
0.3000 mg/kg | Freq: Once | INTRAVENOUS | Status: DC
Start: 2015-09-07 — End: 2015-09-08

## 2015-09-07 MED ORDER — OXYCODONE-ACETAMINOPHEN 5-325 MG PO TABS
1.0000 | ORAL_TABLET | Freq: Once | ORAL | Status: DC | PRN
Start: 2015-09-07 — End: 2015-09-07

## 2015-09-07 MED ORDER — FENTANYL 10 MCG/ML NS 100 ML (OUTSOURCED)
50.0000 ug/h | INTRAVENOUS | Status: DC
Start: 2015-09-07 — End: 2015-09-08
  Administered 2015-09-07: 25 ug/h via INTRAVENOUS
  Administered 2015-09-08: 50 ug/h via INTRAVENOUS
  Filled 2015-09-07 (×2): qty 100

## 2015-09-07 MED ORDER — LACTATED RINGERS IV SOLN
50.0000 mL/h | INTRAVENOUS | Status: DC
Start: 2015-09-07 — End: 2015-09-07

## 2015-09-07 MED ORDER — FENTANYL CITRATE (PF) 50 MCG/ML IJ SOLN (WRAP)
25.0000 ug | INTRAMUSCULAR | Status: DC | PRN
Start: 2015-09-07 — End: 2015-09-07

## 2015-09-07 MED ORDER — LACTATED RINGERS IV BOLUS
250.0000 mL | Freq: Once | INTRAVENOUS | Status: DC | PRN
Start: 2015-09-07 — End: 2015-09-07

## 2015-09-07 MED ORDER — HYDRALAZINE HCL 20 MG/ML IJ SOLN
10.0000 mg | Freq: Once | INTRAMUSCULAR | Status: DC | PRN
Start: 2015-09-07 — End: 2015-09-07

## 2015-09-07 MED ORDER — OXYCODONE HCL 5 MG PO TABS
5.0000 mg | ORAL_TABLET | Freq: Four times a day (QID) | ORAL | Status: DC | PRN
Start: 2015-09-07 — End: 2015-09-10
  Administered 2015-09-07 – 2015-09-10 (×3): 5 mg via ORAL
  Filled 2015-09-07 (×2): qty 1

## 2015-09-07 MED ORDER — FENTANYL CITRATE (PF) 50 MCG/ML IJ SOLN (WRAP)
INTRAMUSCULAR | Status: AC
Start: 2015-09-07 — End: 2015-09-07
  Administered 2015-09-07: 50 ug via INTRAVENOUS
  Filled 2015-09-07: qty 2

## 2015-09-07 MED ORDER — HYDROMORPHONE HCL 2 MG PO TABS
2.0000 mg | ORAL_TABLET | Freq: Once | ORAL | Status: DC | PRN
Start: 2015-09-07 — End: 2015-09-07

## 2015-09-07 NOTE — OT Progress Note (Signed)
Occupational Therapy Note    Occupational Therapy Cancellation Note    Patient: Ernest Diaz  VWU:[id number]    Unit: W295/A213.08    Patient not seen for occupational therapy secondary to pt just intubated. Please write resume OT orders when appropriate.      Karolee Stamps, OTR/L   Pager: [phone]

## 2015-09-07 NOTE — Progress Notes (Signed)
NSICU Attending Progress Note    24 h events: tolerated Lasix, stable SBP. CO2 retention. On NC. Afebrile. Urinary retention - strait cath x 2 since yesterday.    VS reviewed.  Neuro: opens eyes spont, Ox1, FC, L pupil 3 mm, brisk, R pupil post surgical, + corneals, FS, TM, weak cough and gag. UE spont AG; TF LE.  HEENT: no JVD  CV: reg S1S2  Lungs: b/l ronchi.  ABd: soft, + BS  Extr:+2 e LE; no c/c; SCDs    Meds, labs and imagistic studies reviewed.    A/P:  1: Acute respiratory failure: with hypoxia and hypercapnia, improved; still with occasional desats with lying flat and hypercapnic.  - aspiration pneumonia/atelectasis; fluid overload; poor secretions clearance, decrease FRC form prior spine surgery.  - pneumonia - Stenotrophomonas; cont Levaquin x 5 days; stop date 3/29.  - diuresis q 12h.  - IPV - increase to q 4h  - not a candidate for NIPPV.  - CxR with bibasilar infiltrate. Check ABG and CxR.  2: Encephalopathy: improved; off Seroquel  3: Traumatic cervicothoracic epidural hematoma s/p C3-C5 + T3-T4 decompressive laminectomies 2/27   - NS f/u  - hard collar at all times.  4: S/p G tube - tol. TF. Avoid constipation.  5: ID - Stenotrophomonas pneumonia. On abx.  E fecalis UTI - treated.   6: Urinary retention - persistent; s/p strait cath; might need Foley.  7: Hypotension - chronic, in the setting of dysautonomia. Continue Midodrine and Florinef. Continue diuresis as tolerated by BP. D/c Cardura.  8: GI/DVT prophylaxis: on Pepcid; LMWH.  9: Afib: rate controlled; off AC.  10: Will attempt to transfer to the [facility] if ABG OK and tolerates diuresis.    CCM time x 39 min, not overlapping with other providers.    Leonette Monarch MD  09/07/2015 8:28 AM

## 2015-09-07 NOTE — Procedures (Addendum)
Endotracheal Intubation    Date: 09/07/2015  Time: 11:55 AM  Indication: Complete left lung atelectasis and bronchoscopy requirements  Fellow: Sterling Big, CCMD  Attending: Leonette Monarch, CCMD     A time-out was completed verifying correct patient, procedure, site, positioning, and special equipment if applicable. The patient was placed in a flat position. Sedation was obtained using etomidate 30mg  and fentanyl 50 mcg. Paralysis was achieved using rocuronium 100 mg. The patient was easily ventilated using an ambu bag. The GLIDESCOPE was used and inserted into the oropharynx at which time there was a Grade 1 view of the vocal cords. A 8.0-french endotracheal tube was inserted and visualized going through the vocal cords. The stylette was removed. Colorimetric change was visualized on the CO2 meter. Breath sounds were heard in the right lung field (left lung known to be collapsed prior to procedure). The endotracheal tube was placed at 24 cm, measured at the lip. The Attending, Dr. Jearld Shines, was present for the entire procedure.     A chest x-ray was ordered to assess for pneumothorax and verify endotracheal tube placement.     Estimated Blood Loss: 0 cc    The patient tolerated the procedure well and there were no complications.       [facility] Attending Note  I was present for the entire procedure.    Anthany Thornhill MD  09/07/2015 3:26 PM

## 2015-09-07 NOTE — Consults (Signed)
SURGICAL CONSULTATION  Team 1Maryan Char, [place] [id number]    Date Time: 09/07/2015 6:43 PM  Patient Name: Ernest Diaz  Attending Physician: Petra Kuba, MD  Consulting Physician:  Lacretia Nicks, MD  Reason for consultation: Janina Mayo placement    History of Present Illness:   Ernest Diaz is a 70 y.o. male w/hx of Afib on Eliquis who presented to [facility] on 2/25 after fall- CT imaging negative and pt was discharged home.  He represented on 2/27 with paraparesis- noted to have extensive epidural hematoma from cervical/thoracic spine- and underwent C3-C5/T3-T4 decompressive laminectomies (Dr. Jaynie Collins)- surgery complicated by bleeding of dorsal venous plexus and the patient was transferred to [facility] for spinal angiogram and further management.      Since admission to [facility], he has undergone 3/7- spinal angio-negative for aneurysm, AVM/AVF.  3/9- C6/C7 anterior decompression/ posterior laminectomy/fusion and 3/18- removal of hardware, redo posterior cervical decompression- C5-T1 arthrodesis. Postoperatively, the patient has remained in the ICU.  Underwent PEG procedure by IR on 3/24- remained intubated postprocedure and was extubated on 3/25.  Pt had a decline in mental status/agitation with development of progressive respiratory failure- ultimately requiring intubation on 3/27.  SCCS consulted for trach placement.      Past Medical History:     Past Medical History   Diagnosis Date   . Hypertension    . Arthritis    . Pericarditis 1975   . Atrial fibrillation    . Arrhythmia    . S/P IVC filter 08/31/2015     temporary       Past Surgical History:     Past Surgical History   Procedure Laterality Date   . Vein surgery     . Colonoscopy       x2   . 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 Eye surgery  1960     Rt eye removed    . Decompression, anterior cervical, fusion, levels 2 N/A 08/20/2015     Procedure: DECOMPRESSION, ANTERIOR CERVICAL, FUSION, LEVELS 2;  Surgeon: Madaline Savage, MD;  Location: [facility];  Service: Neurosurgery;  Laterality: N/A;  C5-C7 ACDF; AVM REMOVAL   . Decompression, posterior cervical, fusion, levels 2 N/A 08/20/2015     Procedure: DECOMPRESSION, POSTERIOR CERVICAL, FUSION, LEVELS 2;  Surgeon: Madaline Savage, MD;  Location:  [facility];  Service: Neurosurgery;  Laterality: N/A;   . Ivc filter placement N/A 08/31/2015     Procedure: IVC FILTER PLACEMENT;  Surgeon: Leeroy Bock, MD;  Location: [facility];  Service: Interventional Radiology;  Laterality: N/A;   . Fusion, anterior cervical, level 1 N/A 08/28/2015     Procedure: RE-DO ANTERIOR CERVICAL FUSION C6-7;  Surgeon: Brunetta Genera, MD;  Location[facility];  Service: Neurosurgery;  Laterality: N/A;  RE-DO ANTERIOR CERVICAL FUSION C6-7  REMOVAL OF ANTERIOR HARDWARE, C6-C7; POSTERIOR CERVICAL DECOMPRESSION, REDUCTION OF FRACTURE, FUSION C5-T1 WITH INSTRUMENTATION; ANTERIOR CERVICAL CORPECTOMY AND FUSION C5-C7 WITH INSTRUMENTATION.     NSYG procedures- per H&P, IVC Filter   Family History:     Family History   Problem Relation Age of Onset   . Intracerebral hemorrhage Neg Hx    . Stroke Neg Hx        Social History:     Social History  Social History   . Marital Status: Married     Spouse Name: N/A   . Number of Children: N/A   . Years of Education: N/A     Social History Main Topics   . Smoking status: Former Smoker     Quit date: 03/07/1986   . Smokeless tobacco: Never Used   . Alcohol Use: 1.2 - 1.8 oz/week     2-3 Shots of liquor per week      Comment: daily   . Drug Use: No   . Sexual Activity: Not on file     Other Topics Concern   . Not on file     Social History Narrative       Allergies:     Allergies   Allergen Reactions   . Sulfa Antibiotics Other (See Comments)     Lip swelling       Medications:     Prior to Admission  medications    Medication Sig Start Date End Date Taking? Authorizing Provider   amitriptyline (ELAVIL) 10 MG tablet Take 10 mg by mouth nightly. Started at [facility] 08/2015   Yes [provider]   amLODIPine (NORVASC) 10 MG tablet Take 10 mg by mouth daily.   Yes [provider]   Calcium Carb-Cholecalciferol (CALCIUM 600 + D PO) Take by mouth daily.   Yes [provider]   Cholecalciferol (VITAMIN D3) 1000 UNITS Cap Take by mouth every mon, wed and fri.   Yes [provider]   Flaxseed, Linseed, (FLAX SEEDS PO) Take by mouth daily.   Yes [provider]   folic acid (FOLVITE) 400 MCG tablet Take 400 mcg by mouth daily.   Yes [provider]   Magnesium 300 MG Cap Take by mouth daily.   Yes [provider]   Multiple Vitamins-Minerals (CENTRUM SILVER ADULT 50+ PO) Take by mouth daily.   Yes [provider]   Omega-3 Fatty Acids (FISH OIL) 1200 MG Cap Take by mouth daily.   Yes [provider]   triamterene-hydrochlorothiazide (MAXZIDE-25) 37.5-25 MG per tablet Take 1 tablet by mouth daily.   Yes [provider]   acetaminophen (TYLENOL) 325 MG tablet Take 1 tablet (325 mg total) by mouth every 4 (four) hours as needed. 08/22/15   Isaias Cowman, MD   atenolol (TENORMIN) 50 MG tablet Take 0.5 tablets (25 mg total) by mouth daily. 08/22/15   Isaias Cowman, MD   finasteride (PROSCAR) 5 MG tablet Take 1 tablet (5 mg total) by mouth nightly. 08/22/15   Isaias Cowman, MD   methocarbamol (ROBAXIN) 750 MG tablet Take 1 tablet (750 mg total) by mouth 3 (three) times daily as needed. 08/22/15   Isaias Cowman, MD   oxyCODONE 10 MG Tab Take 1 tablet (10 mg total) by mouth every 4 (four) hours as needed for Pain. 08/22/15   Isaias Cowman, MD   senna-docusate (PERICOLACE) 8.6-50 MG per tablet Take 1 tablet by mouth 2 (two) times daily. 08/22/15   Isaias Cowman, MD       Review of Systems:   General ROS:  negative  ENT ROS: negative  Cardiovascular ROS: negative  Respiratory ROS: negative  Gastrointestinal ROS: negative  Genito-Urinary ROS: negative  Hematological and Lymphatic ROS: negative  Endocrine ROS: negative  Musculoskeletal ROS: negative  Neurological ROS: negative     Physical Exam:     Filed Vitals:    09/07/15 1700   BP: 122/58  Pulse: 66   Temp: 99.1   Resp: 24   SpO2: 92%     Gen: Alert  HEEMT: [place]  Chest: CTAB. PRVC 40/5  CV: Afib, RR  Abd: Soft, nontender, nondistended. PEG in place  Ext: Palpable pedal pulses  Neuro: GCS (E3V1M5)- Localizes RUE only, flickers toes BLE- not W/D.       Labs:     Results     Procedure Component Value Units Date/Time    CULTURE + Dierdre Forth [[id number]] Collected:  09/07/15 1240    Specimen Information:  Sputum from Bronchial Lavage Updated:  09/07/15 1714    Narrative:      ORDER#: [id number]                                    ORDERED BY: Jearld Shines, DAN  SOURCE: Bronchial Lavage bronchoscopy                COLLECTED:  09/07/15 12:40  ANTIBIOTICS AT COLL.:                                RECEIVED :  09/07/15 15:15  Stain, Gram (Respiratory)                  FINAL       09/07/15 17:13  09/07/15   Moderate WBC's             No Squamous epithelial cells             No organisms seen  Culture and Gram Stain, Aerobic, RespiratorPENDING      Glucose Whole Blood - POCT [[id number]]  (Abnormal) Collected:  09/07/15 1555     POCT - Glucose Whole blood 115 (H) mg/dL Updated:  08/65/78 4696    Blood gas, arterial [[id number]]  (Abnormal) Collected:  09/07/15 1456    Specimen Information:  Blood, Arterial Updated:  09/07/15 1514     pH, Arterial 7.532 (H)      pCO2, Arterial 45.7 (H) mmhg      pO2, Arterial 230.0 (H) mmhg      HCO3, Arterial 38.1 (H) mEq/L      Arterial Total CO2 39.4 (H) mEq/L      Base Excess, Arterial 13.7 (H) mEq/L      O2 Sat, Arterial >100.0 %      ABG CollectionSite Left Radl      Temperature 37.3      FIO2 100 %      O2 Delivery Ventilator       Rate 12      Mode: PRVC      PEEP 5      Tidal vol. 600     Glucose Whole Blood - POCT [[id number]]  (Abnormal) Collected:  09/07/15 1024     POCT - Glucose Whole blood 171 (H) mg/dL Updated:  36/64/40 3474    Blood gas, arterial [[id number]]  (Abnormal) Collected:  09/07/15 1002    Specimen Information:  Blood, Arterial Updated:  09/07/15 1018     pH, Arterial 7.421      pCO2, Arterial 61.8 (H) mmhg      pO2, Arterial 161.0 (H) mmhg      HCO3, Arterial 39.3 (H) mEq/L      Arterial Total CO2 41.1 (H) mEq/L      Base Excess, Arterial 13.1 (H) mEq/L  O2 Sat, Arterial 99.9 %      ABG CollectionSite Right Radl      Temperature 37.3      FIO2 40 %      O2 Delivery Nasal Cannula     Basic Metabolic Panel [[id number]]  (Abnormal) Collected:  09/07/15 0528    Specimen Information:  Blood Updated:  09/07/15 0631     Glucose 153 (H) mg/dL      BUN 30.1 mg/dL      Creatinine 0.5 (L) mg/dL      Calcium 8.2 mg/dL      Sodium 601 mEq/L      Potassium 3.4 (L) mEq/L      Chloride 100 mEq/L      CO2 40 (H) mEq/L     GFR [[id number]] Collected:  09/07/15 0528     EGFR >60.0 Updated:  09/07/15 0631    CBC and differential [[id number]]  (Abnormal) Collected:  09/07/15 0528    Specimen Information:  Blood from Blood Updated:  09/07/15 0616     WBC 6.00 x10 3/uL      Hgb 10.4 (L) g/dL      Hematocrit 62.3 (L) %      Platelets 240 x10 3/uL      RBC 3.30 (L) x10 6/uL      MCV 100.9 (H) fL      MCH 31.5 pg      MCHC 31.2 (L) g/dL      RDW 14 %      MPV 10.3 fL      Neutrophils 79 %      Lymphocytes Automated 11 %      Monocytes 9 %      Eosinophils Automated 0 %      Basophils Automated 0 %      Immature Granulocyte 1 %      Nucleated RBC 0 /100 WBC      Neutrophils Absolute 4.75 x10 3/uL      Abs Lymph Automated 0.65 x10 3/uL      Abs Mono Automated 0.53 x10 3/uL      Abs Eos Automated 0.02 x10 3/uL      Absolute Baso Automated 0.00 x10 3/uL      Absolute Immature Granulocyte 0.05 x10 3/uL     Glucose Whole Blood - POCT [[id number]]   (Abnormal) Collected:  09/06/15 2241     POCT - Glucose Whole blood 133 (H) mg/dL Updated:  61/60/73 7106          Rads:     Radiology Results (24 Hour)     Procedure Component Value Units Date/Time    XR Chest AP Portable [[id number]] Collected:  09/07/15 1448    Order Status:  Completed Updated:  09/07/15 1453    Narrative:      History intubation    Radiograph/s of the chest  show low lung volumes (poor inspiratory  effort) and bibasilar atelectasis. The upper lung fields are clear.  The  cardiomediastinal silhouette is normal in size and configuration.  The  pulmonary vasculature is normal. There is no congestive failure. No  hilar adenopathy. No pneumothorax. There are small effusions.  The bony  thorax is intact. Endotracheal tube 6 images above the carina.  Postoperative changes noted in the neck      Impression:       Low lung volumes with mild bibasilar atelectasis. Small  effusions. Endotracheal tube 6 images above the carina    Jerald Kief, MD  09/07/2015 2:49 PM      XR Chest AP Portable [[id number]] Collected:  09/07/15 7829    Order Status:  Completed Updated:  09/07/15 0930    Narrative:      History: Follow-up pulmonary edema and pneumonia    Prior: 09/05/2015    Portable: Overlying EKG leads. There is near complete opacification of  the left lung. Stable right-sided tapering opacity. The heart is  obscured. Normal pulmonary vasculature. Osseous structures are  unremarkable. Partially visualized cervical fusion hardware.      Impression:        1. Near complete opacification of the left lung which may be secondary  to mucous plugging. Urgent findings discussed with, and read back by,  nurse Misty Stanley, RN at [phone] on 09/07/2015.  2. Stable right-sided pleural effusion and atelectasis.    Mickel Duhamel, MD   09/07/2015 9:25 AM            Problem List:     Patient Active Problem List   Diagnosis   . Quadriplegia   . Atrial fibrillation   . Shock   . Cord compression   . Acute respiratory failure   . Aspiration  pneumonia   . Acute deep vein thrombosis (DVT) of femoral vein of right lower extremity   . Atrial fibrillation, unspecified type   . Chronic atrial fibrillation   . Aspiration pneumonia of both lower lobes, unspecified aspiration pneumonia type   . Encephalopathy   . Mucus plugging of bronchi       Assessment:   70 y.o. male w/quadraplegia w/acute respiratory failure    Plan:   -Acute respiratory failure: Intubated for PEG procedure on 3/24- extubated on 3/25- requiring increasing O2 settings.  Re-intubated on 3/27.  Minimal vent settings currently- PRVC 40/5- would be a potential appropriate candidate for perc trach however only intubated for 1 day.  Would recommend SBT to see if pt tolerates prior to trach intervention.     -Upon chart review, conflicting reports regarding code status and pt's wishes.  Per previous documentation and RN, pt was DNR/DNI.  This was reveresed for PEG procedure.  Palliative care previously following 3/22 (no recent note).  Advanced directives in chart- only reflect wife/son as POA.  Will follow up with [facility] team and family regarding overall goals of care.     -Will continue to follow along and plan for trach in the upcoming week if pt not tolerating SBT and with clarification for primary/palliative that this is what the pt would want.       Signed by: Lahoma Rocker, MD  General Surgery Resident    I was present for the physical examination and have reviewed the notes, assessments, and/or procedures performed by the resident.  I have edited the note to reflect any changes I have made.  I am in agreement with their plan upon my signature as an Attending.

## 2015-09-07 NOTE — Progress Notes (Signed)
[facility]S Note    CxR showing L lung opacification, c/w L main plugging; stable R base opacity. ABG with PCO2 69.1.    VS reviewed.  Neuro: AA, Ox1, able to say single words, moves upper extremities against gravity, TF LE.  Pulm: b/l ronchi, decreased BS L lung.    Meds, labs and imagistic studies reviewed.    A/P:  1: Ac resp failure, hypercapnic, recurrent, now with L lung collapse from L main mucus plugging due to poor secretions clearance  - likely occurred yesterday, pt failed IPV and suctioning.  - case explained and discussed first with son and then with both patient and son.  - they both agreed to proceed with intubation, bronch and trach.  - will change code status to limited DNR allowing for intubation.  2: Will keep in ICU.    Additional CCM time x 40 min, not overlapping with other providers.    Sulema Braid MD  09/07/2015 11:18 AM

## 2015-09-07 NOTE — Progress Notes (Signed)
ID PROGRESS NOTE    Date Time: 09/07/2015 8:52 AM  Patient Name: [facility]        Subjective, ROS:    Afebrile, Tmax 99.1  On 4 lit    Son at bedside    Sates that feels better after pain medication   SOB +  No vomiting, tolerating TF   Weak      Antibiotics and culture results:   Antibiotics: levaquin #3   Midodrine   S/P vanc , zosyn       Cultures:  3/22 sputum cx Stenotrophomonas maltophilia , mixed upper flora    3/20 flu negative   3/18 cervical wound cx cut flora  3/18 cervical wound anaerobic cx ngtd  3/18 cervical wound AFB and fungal cx P  3/13 Ucx >100,000 E. Faecalis   3/13 Blood cx Ngtd  3/6 MRSA negative     3/9 path FIBROVASCULAR TISSUE, FEW VESSELS AND BONE CHIPS       Lines: PIV access    Physical Exam:     Filed Vitals:    09/07/15 0700   BP: 104/55   Pulse: 78   Temp:    Resp: 21   SpO2: 95%       General: in bed, in ICU, awake and communicative. On NC, in mild distress.  Son at bedside.    HEENT: slightly mucosa , blind in one eye,  pale, not icteric , cervical collar in place  Chest: S1S2 irregular , few crackles bilaterally,  no wheeze , BS at bases decreased.   Abdomen: soft, not tender , BS +  Ext: + edema   Weakness  In legs and arms   Foley in place       Labs:       Recent CBC WITH DIFF   Recent Labs      09/07/15   0528   WBC  6.00   RBC  3.30*   HGB  10.4*   HEMATOCRIT  33.3*   MCV  100.9*       Recent CMP   Recent Labs      09/07/15   0528   GLUCOSE  153*   BUN  13.0   CREATININE  0.5*   SODIUM  145   POTASSIUM  3.4*   CHLORIDE  100   CO2  40*           Rads:   3/27 xray Near complete opacification of the left lung which may be secondary to mucous plugging. Urgent findings discussed with, and read back by, nurse Misty Stanley, RN at [phone] on 09/07/2015. Stable right-sided pleural effusion and atelectasis    3/26 Postoperative changes as above    3/25 xray Stable exam. Cardiomegaly with moderate bilateral pleural effusions and  bibasilar atelectasis and/or  consolidation    3/23 sono Similar findings as seen on recent CT. Thick-walled bladder without significant distention with pericholecystic fluid. Lack of gallstones and clear focal tenderness makes cholecystitis less likely. The patient was uncooperative for this portable examination however. Differential considerations would include hypoalbuminemia, chronic cholecystitis or acute cholecystitis. If continued clinical concern can perform HIDA scan. Bilateral effusions. Patient refused renal evaluation.    3/22 CT  Decompressed stomach located within the anterior abdomen extending  below the left hepatic lobe margin and above the transverse colon.. Nonspecific gallbladder wall thickening with pericholecystic fluid and stranding in the presence of small ascites. Recommend clinical correlation for acute cholecystitis.. Bilateral small effusions and basilar lower lobe  consolidative atelectasis. Cardiac enlargement.    3/22 xray Newly appearing small bilateral pleural effusions with overlying consolidation    3/20 IRSuccessful deployment of a retrievable inferior vena cava filter    3/19 doppler Nonocclusive deep venous thrombosis involving a short segment of the proximal femoral vein.    3/17 CT  Markedly abnormal examination with anterolisthesis of C6 upon C7  measuring 16 mm with a jumped facet on the left and a comminuted displaced fracture involving the right C7 articular facet. Anterior cervical spinal fusion hardware is malpositioned with both plate and screws extending into the C6-C7 disc space.  The AP dimension of the bony spinal canal at the superior C5 level is reduced to 5 mm.     3/17 xray No fracture acute osseous abnormality identified in the right shoulder, humerus or forearm    3/15 xray Minimal left basilar linear atelectasis or scarring.    3/8 MRI Postsurgical change with prior laminectomy. 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 There findings suspicious  for a disc ligamentous injury at this level. Correlation with CT is advised    3/7 IR No angiographic evidence of intracranial aneurysm, arteriovenous malformation or dural fistula. Is no angiographic evidence of spinal arteriovenous malformation or dural fistula.  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   - worsening of weakness due to Fracture dislocation C6-C7 with malpositioned hardware  - S/P C5-T1 posterior fusion, C5-C7 anterior fusion with partial C7 corpectomy on 3/18. cx normal flora     Other issues during admission:  Fever/SIRS on 3/12   Symptoms of aspiration with increased cough and mild-mod oropharyngeal dysphagia   Urinary retention, required reinsertion of foley and E. Faecalis UTI  Leukocytosis  DVT (S/P IVC filter)    Hypoxic respiratory failure overnight 3/21-3/22   Now with Near complete opacification of the left lung   Gallbladder wall thickening per imaging       Plan:   1. SIRS.   Due to UTI and aspiration pneumonia.  Now afebrile and overall hemodynamically more stable.     2. Hypoxic respiratory failure and aspiration pneumonia with Near complete opacification of the left lung on last xray.     Episode on 3/21-22 due to aspiration/malositioned Corpak (per xray not in the stomach), required BiPAP and NRB. Sputum cx normal flora and Stenotrophomonas maltophilia.    Now with Near complete opacification of the left lung , probably mucous plug after extubation.      Was on zosyn, now on levaquin  S/P PEG 3/24  (extubated 3/25)  Continue levaquin for now.   Pulmonary toilet/may need bronch.  Aspiration precaution.     3. E. Faecalis UTI.   Cath related   Foley was removed/then replaced x 2 given retention   Has finished the course.      4. 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   S/P C5-T1 posterior fusion, C5-C7 anterior fusion with partial C7 corpectomy on 3/18. cx normal flora     5. Leukocytosis.   Due to above.   Resolved.    6. Gallbladder wall thickening  No symptoms suggestive of cholecystitis for now     7. Goals of care.   Palliative care on board     Discussed with the patient and his son.   Discussed with ICU team.      Signed by: [place]. [id number]  Infectious Disease  Phone: [phone]  Fax: [phone]

## 2015-09-07 NOTE — PT Progress Note (Signed)
[facility]   Physical Therapy Cancellation Note      Patient:  Ernest Diaz MRN#:  [id number]  Unit:  [facility] Room/Bed:  F715/F715.01    09/07/2015  Time: 1440      Pt not seen for physical therapy secondary to pt re-intubated today. Will complete PT orders at this time but please place resume orders for PT if/when appropriate.     Thanks  Marylou Flesher, DPT

## 2015-09-07 NOTE — Plan of Care (Signed)
Problem: Safety  Goal: Patient will be free from injury during hospitalization  Outcome: Progressing    Problem: Pain  Goal: Patient's pain/discomfort is manageable  Outcome: Progressing    Problem: Neurological Deficit  Goal: Neurological status is stable or improving  Outcome: Progressing

## 2015-09-07 NOTE — Plan of Care (Signed)
Intubated and bronchoscopy today after AM chest xray resulted. Very large amount of mucus lavaged and sample sent to lab. Follow up chest xray much improved. Now on fentanyl gtt. Tube feeds restarted. Foley reinserted.

## 2015-09-07 NOTE — Plan of Care (Signed)
Problem: Safety  Goal: Patient will be free from injury during hospitalization  Outcome: Progressing  Bed locked in lowest position. Hourly rounding.     Problem: Pain  Goal: Patient's pain/discomfort is manageable  Outcome: Progressing  The Patient denies pain. CPOT scores were zero.    Problem: Tissue integrity  Goal: Damaged tissue is healing and protected  Outcome: Progressing  Venelex applied to injured sites as ordered. Patient is turned and repositioned every 2 hours.    Problem: Inadequate Gas Exchange  Goal: Adequately oxygenating and ventilation is improved  Outcome: Progressing  Patient remains on 4L of O2. He was NT suctioned four times.     Problem: Urinary Incontinence  Goal: Perineal Skin Integrity is maintained or improved  Outcome: Progressing  Condom catheter is present. Patient was straight cath twice.

## 2015-09-07 NOTE — Procedures (Signed)
Bronchoscopy Procedure Note    Procedure:  1. Flexible bronchoscopy    Pre-Operative Diagnosis: Mucus plugging with left lung atelectasis   Post-Operative Diagnosis: Same    Indication: Complete left lung atelectasis    Anesthesia: Patient intubated and sedated    Procedure Details: Patient's son was consented for the procedure with all risk and benefit of the procedure explained in detail.  The surrogate was given the opportunity to ask questions and all concerns were answered.  The bronchocope was inserted into the main airway via the ETT. An anatomical survey was done of the main airways and the subsegmental bronchus. Copious thick white secretions were noted in the leftmainstem bronchus. Several washings were performed. The secretions were thinned and suctioning was successful with normal patency of airways. Left airway mucosa was edematous and friable with some mucosal bleeding.     Estimated Blood Loss: 2 cc           Specimens:  Bronchial wash sent for several studies                Complications: None; patient tolerated the procedure well.           Disposition: NSICU    Dr. Jesusita Oka Chalese Peach was present for all portions of the exam    Patient tolerated the procedure well.    [facility] Attending Addendum    I was present for the entire procedure and performed part of it.    Mackenzi Krogh MD  09/07/2015 3:39 PM

## 2015-09-08 ENCOUNTER — Inpatient Hospital Stay: Payer: Medicare Other

## 2015-09-08 DIAGNOSIS — J9690 Respiratory failure, unspecified, unspecified whether with hypoxia or hypercapnia: Secondary | ICD-10-CM

## 2015-09-08 LAB — CBC AND DIFFERENTIAL
Basophils Absolute Automated: 0.01 10*3/uL (ref 0.00–0.20)
Basophils Automated: 0 %
Eosinophils Absolute Automated: 0.03 10*3/uL (ref 0.00–0.70)
Eosinophils Automated: 0 %
Hematocrit: 34 % — ABNORMAL LOW (ref 42.0–52.0)
Hgb: 10.7 g/dL — ABNORMAL LOW (ref 13.0–17.0)
Immature Granulocytes Absolute: 0.04 10*3/uL
Immature Granulocytes: 1 %
Lymphocytes Absolute Automated: 1.29 10*3/uL (ref 0.50–4.40)
Lymphocytes Automated: 20 %
MCH: 31.6 pg (ref 28.0–32.0)
MCHC: 31.5 g/dL — ABNORMAL LOW (ref 32.0–36.0)
MCV: 100.3 fL — ABNORMAL HIGH (ref 80.0–100.0)
MPV: 10.5 fL (ref 9.4–12.3)
Monocytes Absolute Automated: 0.78 10*3/uL (ref 0.00–1.20)
Monocytes: 12 %
Neutrophils Absolute: 4.39 10*3/uL (ref 1.80–8.10)
Neutrophils: 67 %
Nucleated RBC: 0 /100 WBC (ref 0–1)
Platelets: 222 10*3/uL (ref 140–400)
RBC: 3.39 10*6/uL — ABNORMAL LOW (ref 4.70–6.00)
RDW: 15 % (ref 12–15)
WBC: 6.54 10*3/uL (ref 3.50–10.80)

## 2015-09-08 LAB — GLUCOSE WHOLE BLOOD - POCT
Whole Blood Glucose POCT: 118 mg/dL — ABNORMAL HIGH (ref 70–100)
Whole Blood Glucose POCT: 115 mg/dL — ABNORMAL HIGH (ref 70–100)

## 2015-09-08 LAB — BASIC METABOLIC PANEL
BUN: 12 mg/dL (ref 9.0–28.0)
CO2: 35 mEq/L — ABNORMAL HIGH (ref 22–29)
Calcium: 7.9 mg/dL (ref 7.9–10.2)
Chloride: 101 mEq/L (ref 100–111)
Creatinine: 0.6 mg/dL — ABNORMAL LOW (ref 0.7–1.3)
Glucose: 127 mg/dL — ABNORMAL HIGH (ref 70–100)
Potassium: 3.1 mEq/L — ABNORMAL LOW (ref 3.5–5.1)
Sodium: 145 mEq/L (ref 136–145)

## 2015-09-08 LAB — GFR: EGFR: >60

## 2015-09-08 MED ORDER — SODIUM CHLORIDE 0.9 % IV SOLN
1.0000 g | INTRAVENOUS | Status: DC | PRN
Start: 2015-09-08 — End: 2015-09-09

## 2015-09-08 MED ORDER — POTASSIUM CHLORIDE 10 MEQ/100ML IV SOLN
10.0000 meq | INTRAVENOUS | Status: DC | PRN
Start: 2015-09-08 — End: 2015-09-09
  Administered 2015-09-09 (×2): 10 meq via INTRAVENOUS
  Filled 2015-09-08: qty 100

## 2015-09-08 MED ORDER — HYDROMORPHONE HCL 1 MG/ML IJ SOLN
0.5000 mg | INTRAMUSCULAR | Status: DC | PRN
Start: 2015-09-08 — End: 2015-09-10
  Administered 2015-09-08 – 2015-09-10 (×9): 0.5 mg via INTRAVENOUS
  Filled 2015-09-08 (×9): qty 1

## 2015-09-08 MED ORDER — VECURONIUM BROMIDE 10 MG IV SOLR
INTRAVENOUS | Status: AC
Start: 2015-09-08 — End: 2015-09-08
  Administered 2015-09-08: 10 mg via INTRAVENOUS
  Filled 2015-09-08: qty 10

## 2015-09-08 MED ORDER — SODIUM PHOSPHATE 3 MMOLE/ML IV SOLN
25.0000 mmol | INTRAVENOUS | Status: DC | PRN
Start: 2015-09-08 — End: 2015-09-09

## 2015-09-08 MED ORDER — FENTANYL CITRATE (PF) 50 MCG/ML IJ SOLN (WRAP)
300.0000 ug | Freq: Once | INTRAMUSCULAR | Status: AC
Start: 2015-09-08 — End: 2015-09-07

## 2015-09-08 MED ORDER — MIDAZOLAM HCL 2 MG/2ML IJ SOLN
10.0000 mg | Freq: Once | INTRAMUSCULAR | Status: DC
Start: 2015-09-08 — End: 2015-09-08
  Filled 2015-09-08: qty 10

## 2015-09-08 MED ORDER — TERAZOSIN HCL 1 MG PO CAPS
1.0000 mg | ORAL_CAPSULE | Freq: Every evening | ORAL | Status: DC
Start: 2015-09-08 — End: 2015-09-24
  Administered 2015-09-10 – 2015-09-23 (×9): 1 mg via ORAL
  Filled 2015-09-08 (×15): qty 1

## 2015-09-08 MED ORDER — KETAMINE HCL 50 MG/ML IJ SOLN
2.0000 mg/kg | Freq: Once | INTRAMUSCULAR | Status: AC
Start: 2015-09-08 — End: 2015-09-08
  Administered 2015-09-08: 200 mg via INTRAVENOUS
  Filled 2015-09-08: qty 10

## 2015-09-08 MED ORDER — HYDROMORPHONE HCL 1 MG/ML IJ SOLN
INTRAMUSCULAR | Status: AC
Start: 2015-09-08 — End: 2015-09-08
  Administered 2015-09-08: 0.5 mg via INTRAVENOUS
  Filled 2015-09-08: qty 1

## 2015-09-08 MED ORDER — POTASSIUM CHLORIDE 20 MEQ PO PACK
40.0000 meq | PACK | Freq: Once | ORAL | Status: AC
Start: 2015-09-08 — End: 2015-09-08
  Administered 2015-09-08: 40 meq via ORAL
  Filled 2015-09-08: qty 2

## 2015-09-08 MED ORDER — SODIUM PHOSPHATE 3 MMOLE/ML IV SOLN
35.0000 mmol | INTRAVENOUS | Status: DC | PRN
Start: 2015-09-08 — End: 2015-09-09

## 2015-09-08 MED ORDER — VECURONIUM BROMIDE 10 MG IV SOLR
10.0000 mg | Freq: Once | INTRAVENOUS | Status: AC
Start: 2015-09-08 — End: 2015-09-08

## 2015-09-08 MED ORDER — MAGNESIUM SULFATE IN D5W 10-5 MG/ML-% IV SOLN
1.0000 g | INTRAVENOUS | Status: DC | PRN
Start: 2015-09-08 — End: 2015-09-09

## 2015-09-08 MED ORDER — FENTANYL CITRATE (PF) 50 MCG/ML IJ SOLN (WRAP)
100.0000 ug | Freq: Once | INTRAMUSCULAR | Status: AC
Start: 2015-09-08 — End: 2015-09-08
  Administered 2015-09-08: 100 ug via INTRAVENOUS

## 2015-09-08 MED ORDER — GABAPENTIN 50 MG/ML UNIT DOSE
200.0000 mg | Freq: Every evening | ORAL | Status: DC
Start: 2015-09-08 — End: 2015-09-09
  Administered 2015-09-09: 200 mg via ORAL
  Filled 2015-09-08 (×3): qty 6

## 2015-09-08 MED ORDER — KETAMINE HCL 50 MG/ML IJ SOLN
25.0000 mg | Freq: Once | INTRAMUSCULAR | Status: AC
Start: 2015-09-08 — End: 2015-09-08
  Administered 2015-09-08: 25 mg via INTRAVENOUS

## 2015-09-08 MED ORDER — SODIUM PHOSPHATE 3 MMOLE/ML IV SOLN
15.0000 mmol | INTRAVENOUS | Status: DC | PRN
Start: 2015-09-08 — End: 2015-09-09

## 2015-09-08 NOTE — Progress Notes (Signed)
[facility] Note:    Spoke with son, Damin Salido. Patient's wife, Osher Oettinger has dementia and so Fara Olden is currently POA.  Discussed consent for tracheostomy placement via telephone. Discussed risks, benefits and alternatives. Farley Crooker is in agreement and wishes to proceed with the tracheostomy placement. We will proceed with trach today. Please hold TFs. Dr. Einar Pheasant aware.     Melrose Nakayama, MD  PGY2 General Surgery   Spectralink [phone]

## 2015-09-08 NOTE — Plan of Care (Addendum)
Problem: Pain  Goal: Patient's pain/discomfort is manageable  Outcome: Progressing  Pt received PRN dilaudid Q2H for pain from tracheostomy.    Problem: Tissue integrity  Goal: Damaged tissue is healing and protected  Outcome: Progressing  Blanchable region on sacrum has meplex applied and venelx applied throughout shift. Remains unchanged    Problem: Moderate/High Fall Risk Score >5  Goal: Patient will remain free of falls  Outcome: Progressing  Floor mats down, bed alarm on.    Problem: Neurological Deficit  Goal: Neurological status is stable or improving  Outcome: Progressing  Pt AxOx3. Moves upper extremities antigravity, cannot overcome resistance.

## 2015-09-08 NOTE — Progress Notes (Signed)
NSICU Attending Progress Note    24 h events:   Intubated due to recurrent mucus plugging, bronched. Trach placed today.   Received ketamine during procedure and became bradycardic but HD stable.     VS reviewed.  Neuro: opens eyes spont, Ox1, FC, L pupil 3 mm, brisk, R pupil post surgical, + corneals, FS, TM, weak cough and gag. UE spont AG; TF LE.  HEENT: no JVD  CV: reg S1S2  Lungs: b/l ronchi. On vent.   ABd: soft, + BS  Extr:+2 e LE; no c/c; SCDs    Meds, labs and imagistic studies reviewed.    A/P:  1: Acute respiratory failure: with hypoxia and hypercapnia, improved; still with occasional desats with lying flat and hypercapnic. Recurrent mucus plugging resulting in complete L lung collapse on 3/27 requiring reintubation and bronch.   - s/p trach (3/28) - begin SBT and TC trials as tolerated. Will need long term trach for airway protection.   - aspiration pneumonia/atelectasis; fluid overload; poor secretions clearance, decrease FRC form prior spine surgery.  - pneumonia - Stenotrophomonas; cont Levaquin x 5 days; stop date 3/29.  - diuresis prn, hold today   - IPV - cont q 4h  - d/c fentanyl gtt, use PRN dilaudid for discomfort.   - CxR with bibasilar infiltrate.   2: Encephalopathy: improved ; now off Seroquel  3: Traumatic cervicothoracic epidural hematoma s/p C3-C5 and T3-T4 decompressive laminectomies for EDH evacuation on 08/10/15 by Dr. Jaynie Collins at [facility]. C6-7 ACDF, C6-C7 posterior fusion with lateral mass scews on 08/20/15 by Dr. Jaynie Collins. C5-T1 posterior fusion, C5-C7 anterior fusion with partial C7 corpectomy on 08/29/15 by Dr. Claudette Laws   - NS f/u  - hard collar at all times.  4: Afib: rate controlled; off AC. Re-address with Nsurg on POD#14   5: S/p G tube - tol. TF, resume today. Avoid constipation, + BM today.   6: ID - Stenotrophomonas pneumonia. On abx. New BAL sent 3/27, f/u cx.   E fecalis UTI - treated.   7: Urinary retention - persistent secondary to spinal cord injury. D/c Cardura, start hytrin   8:  Hypotension - chronic, in the setting of dysautonomia. Continue Midodrine and d/c Florinef. Continue diuresis as tolerated by BP.   9: GI/DVT prophylaxis: on Pepcid; LMWH.  10: Will attempt to transfer to the [facility] if ABG OK and tolerates diuresis.    All vitals, medications, labs, micro, and radiology have been personally reviewed by me.  CCT spent with patient excluding procedures and not overlapping with other providers is  minutes.   Nancie Neas, MD  Date/Time: 09/08/2015  2:34 PM    [facility]  Attending MD [phone] (24hr)   Attending MD #2 - [phone]  [facility] PA/NP [phone], [phone] (7a-7p)

## 2015-09-08 NOTE — Progress Notes (Signed)
SNF referrals updated on 09/08/15 to reflect new trach/PEG.  Information to be given to family.    Frederich Chick, LCSW  Case Management and Discharge Planning  [phone]

## 2015-09-08 NOTE — Op Note (Signed)
SURGEON:   Urbano Heir MD.    ASSISTANT:   Melrose Nakayama MD.    PREOPERATIVE DIAGNOSES:    Respiratory failure.    POSTOPERATIVE DIAGNOSES:   Respiratory failure.    TITLE OF PROCEDURE:  1.  Bronchoscopy.  2.  Percutaneous tracheostomy.    ANESTHESIA:  Intravenous conscious sedation.    OPERATIVE FINDINGS:  Normal.    ESTIMATED BLOOD LOSS:  Minimal.    IMPLANT:  #6 Shiley tracheostomy.    COMPLICATIONS:  None.    CONDITION:  Stable.    DESCRIPTION OF PROCEDURE:  After the patient was identified at the bedside and an appropriate surgical pause for the procedure was obtained and verified by all present.      We began our procedure prepping and draping in the anterior neck in the standard surgical fashion.  Following this, bronchoscopy was then performed through the previously placed endotracheal tube.  There was a large amount of thick copious secretions that required suctioning. After this was accomplished, 1% lidocaine was used as local anesthetic to anesthetize the planned incision site, which was in the midline between the cricoid cartilage and the sternal notch.  After adequate local infiltration of anesthetic, a vertical 2 to 3 cm incision was made, and blunt dissection was carried down through the platysma. Following this, a standard 20 gauge needle was used to enter the trachea and visualized with bronchoscopy and air was aspirated through the syringe.  A standard wire was passed through the needle after the syringe was removed and visualized with bronchoscopy towards the right main stem bronchus.  At this point, the endotracheal tube was pulled back until we did visualize the entry point of the wire after the needle was removed. Following this, using a Seldinger technique, a 20 gauge blue dilator was used to dilate the subcutaneous tissues and anterior tracheal wall.  This was then removed and a tapered Blue Rhino dilator was then used again to further dilate the subcutaneous tissues and anterior tracheal  wall. This was removed leaving an inner white catheter over the wire, and this was followed by the insertion of a #6 Shiley tracheostomy over the white catheter and wire into the trachea, and this was directly visualized with bronchoscopy.  Once it was in position, the bronchoscope was removed from the endotracheal tube and placed through the tracheostomy and the carina was visualized.  The patient was extubated and the ventilator was attached to the tracheostomy and we did visualize good tidal volumes.  The tracheostomy was secured to the skin of the anterior neck at four positions with 3-0 Prolene sutures and standard Velcro tracheostomy ties were then approximated around the neck.    The patient did tolerate the procedure well.  Dr. Einar Pheasant was present and participated throughout the entire case.

## 2015-09-08 NOTE — Plan of Care (Signed)
Problem: Safety  Goal: Patient will be free from injury during hospitalization  Outcome: Progressing    Problem: Pain  Goal: Patient's pain/discomfort is manageable  Outcome: Progressing    Problem: Neurological Deficit  Goal: Neurological status is stable or improving  Outcome: Progressing

## 2015-09-08 NOTE — Progress Notes (Signed)
Palliative Medicine Follow up Note   Date Time: 09/08/2015   Patient Name: Ernest Diaz  Attending physician:  Petra Kuba, MD   Palliative care provider: Jerrell Belfast    IMPRESSION:  70 yo man s/p traumatic fall and multiple neurosurgical interventions stabilize cervical and thoracic spine and remove AVM.    RECOMMENDATIONS:  1. Pain: Somatic neuropathic related to spinal injury -On fentanyl inf 130mcg/hour titrated up earlier today by RN. Will decrease fentanyl infusion to 6mcg/hr and add gabapentin 200mg  via PEG QHS and prn dialudid 0.5mg  IV Q2 prn.  Plan to convert off parenteral opiates as get closer to discharge - may only need prn opiates, but if requires sustained release will add fentanyl patch. Continue to watch for signs of constipation. Will titrate bowel regimen based upon needs.    2. Goals of care: DNR, Okay for trach PEG.     3. Anticipated disposition: Rehab facility with trach/PEG hopefully off vent. Unclear if [place] or [place].      Jerrell Belfast  Palliative Medicine   Spectra: [phone] M-F 9-4  Extend Pager: Palliative Medicine [facility] 24/7      Subjective:    Patient seen independently. Reports pain in neck/shoulder areas bilateral which radiates to his arms. He would like medication.  He is rather alert and confirms wish for trach. We discussed his clinical situation including his current status, improved arm function/stength and concern for long term paralysis of lower extremities - potential wheelchair bound state. He understands plan for trach PEG and facility for rehab and that son is working with CM to consider move to [place] to be closer to him.    He denies other pain, dyspnea, nausea.     Medications:     Current Facility-Administered Medications   Medication Dose Route Frequency   . balsam peru-castor oil (VENELEX)   Topical Q12H SCH   . enoxaparin  40 mg Subcutaneous Daily   . etomidate  0.3 mg/kg Intravenous Once   . famotidine  20 mg Oral Q12H SCH    Or    . famotidine  20 mg Intravenous Q12H SCH   . fentaNYL (PF)  50 mcg Intravenous Once   . fludrocortisone  0.2 mg Oral Daily   . HYDROmorphone       . levoFLOXacin  750 mg Intravenous Q24H SCH   . midodrine  10 mg Oral TID MEALS   . polyethylene glycol  17 g Oral Daily   . rocuronium  1 mg/kg Intravenous Once   . senna-docusate  2 tablet Oral TID     Current Facility-Administered Medications   Medication Dose Route   . acetaminophen  325 mg Oral   . albuterol  2.5 mg Nebulization   . benzocaine-menthol  1 lozenge Buccal   . bisacodyl  10 mg Rectal   . calcium carbonate  1,000 mg Oral   . dextrose  15 g of glucose Oral    And   . dextrose  25 mL Intravenous    And   . glucagon (rDNA)  1 mg Intramuscular   . hydrALAZINE  10 mg Intravenous   . HYDROmorphone  0.5 mg Intravenous   . labetalol  10 mg Intravenous   . methocarbamol  750 mg Oral   . naloxone  0.2 mg Intravenous   . ondansetron  4 mg Intravenous   . oxyCODONE  5 mg Oral   . phenol  1 spray Oral  Physical Exam:     Filed Vitals:    09/08/15 1145   BP: 143/72   Pulse: 57   Temp:    Resp: 17   SpO2: 100%     General: awake, alert, able to nod yes consistently  ENT/Oral: OGT OET  Lungs: CTAB, no wheeze/rales or rhonchi, no accessory muscle use  Abd: soft, non-tender, non-distended; normoactive bowel sounds  Ext: no clubbing, cyanosis, or edema      Labs:       Recent Labs  Lab 09/08/15  0329   WBC 6.54   HGB 10.7*   HEMATOCRIT 34.0*   PLATELETS 222         Recent Labs  Lab 09/08/15  0329   SODIUM 145   POTASSIUM 3.1*   CHLORIDE 101   CO2 35*   BUN 12.0   CREATININE 0.6*   EGFR >60.0   GLUCOSE 127*   CALCIUM 7.9         Recent Labs  Lab 09/04/15  0425 09/03/15  0520   BILIRUBIN, TOTAL 0.6 0.6   BILIRUBIN, DIRECT  --  0.3   PROTEIN, TOTAL 5.5* 5.5*   ALBUMIN 2.2* 2.3*   ALT 13 14   AST (SGOT) 15 18       Xr Chest Ap Portable    09/07/2015   Low lung volumes with mild bibasilar atelectasis. Small effusions. Endotracheal tube 6 images above the carina Jerald Kief, MD 09/07/2015 2:49 PM

## 2015-09-08 NOTE — Progress Notes (Signed)
Late entry from 09/07/15. Worker met w/pt and son@ bedside on 09/07/15.  Son expressed that the weekend had been "OK" (pt was intubated shortly after worker's visit). [place] plan continues to be SNF, with an eventual goal of transfer to [place]. Worker has provided family with a list of additional facilities (they had originally requested [facility]) and family continues to review options. Worker will continue to follow.    Frederich Chick, LCSW  Case Management and Discharge Planning  [phone]

## 2015-09-08 NOTE — Progress Notes (Signed)
ID PROGRESS NOTE    Date Time: 09/08/2015 11:03 AM  Patient Name: Ernest Diaz        Subjective, ROS:    Febrile last night to 102.3 , fever trending down   Currently hemodynamically stable     Had left lung collapse due to mucous plug  Got intubated and bronch was done ' Copious thick white secretions were noted in the leftmainstem bronchus.  Left airway mucosa was edematous and friable with some mucosal bleeding'     Trach was placed today     Now sedated  No distress  Per nurse secretions thick          Antibiotics and culture results:   Antibiotics: levaquin #4  Midodrine   S/P vanc , zosyn       Cultures:  3/27 BAL cx P  3/22 sputum cx Stenotrophomonas maltophilia , mixed upper flora    3/20 flu negative   3/18 cervical wound cx cut flora  3/18 cervical wound anaerobic cx ngtd  3/18 cervical wound AFB and fungal cx P  3/13 Ucx >100,000 E. Faecalis   3/13 Blood cx Ngtd  3/6 MRSA negative     3/9 path FIBROVASCULAR TISSUE, FEW VESSELS AND BONE CHIPS       Lines: PIV access    Physical Exam:     Filed Vitals:    09/08/15 1051   BP: 113/59   Pulse: 65   Temp:    Resp: 14   SpO2: 99%       General: in bed, in ICU, vented, sedated. No distress   HEENT: dry mucosa ,  pale, not icteric , cervical collar in place , trach in place   Chest: S1S2 irregular ,  crackles bilaterally,  no wheeze , BS at bases decreased.   Abdomen: soft, not tender , BS +, PEG in place   Ext: + edema   Weakness  In legs and arms   Foley in place       Labs:       Recent CBC WITH DIFF   Recent Labs      09/08/15   0329   WBC  6.54   RBC  3.39*   HGB  10.7*   HEMATOCRIT  34.0*   MCV  100.3*       Recent CMP   Recent Labs      09/08/15   0329   GLUCOSE  127*   BUN  12.0   CREATININE  0.6*   SODIUM  145   POTASSIUM  3.1*   CHLORIDE  101   CO2  35*           Rads:   3/27 xray Low lung volumes with mild bibasilar atelectasis. Small  effusions    3/27 xray Near complete opacification of the left lung which may be secondary to  mucous plugging. Urgent findings discussed with, and read back by, nurse Misty Stanley, RN at [phone] on 09/07/2015. Stable right-sided pleural effusion and atelectasis    3/26 Postoperative changes as above    3/25 xray Stable exam. Cardiomegaly with moderate bilateral pleural effusions and  bibasilar atelectasis and/or consolidation    3/23 sono Similar findings as seen on recent CT. Thick-walled bladder without significant distention with pericholecystic fluid. Lack of gallstones and clear focal tenderness makes cholecystitis less likely. The patient was uncooperative for this portable examination however. Differential considerations would include hypoalbuminemia, chronic cholecystitis or acute cholecystitis. If continued clinical concern can perform  HIDA scan. Bilateral effusions. Patient refused renal evaluation.    3/22 CT  Decompressed stomach located within the anterior abdomen extending  below the left hepatic lobe margin and above the transverse colon.. Nonspecific gallbladder wall thickening with pericholecystic fluid and stranding in the presence of small ascites. Recommend clinical correlation for acute cholecystitis.. Bilateral small effusions and basilar lower lobe consolidative atelectasis. Cardiac enlargement.    3/22 xray Newly appearing small bilateral pleural effusions with overlying consolidation    3/20 IRSuccessful deployment of a retrievable inferior vena cava filter    3/19 doppler Nonocclusive deep venous thrombosis involving a short segment of the proximal femoral vein.    3/17 CT  Markedly abnormal examination with anterolisthesis of C6 upon C7  measuring 16 mm with a jumped facet on the left and a comminuted displaced fracture involving the right C7 articular facet. Anterior cervical spinal fusion hardware is malpositioned with both plate and screws extending into the C6-C7 disc space.  The AP dimension of the bony spinal canal at the superior C5 level is reduced to 5 mm.     3/17 xray No  fracture acute osseous abnormality identified in the right shoulder, humerus or forearm    3/15 xray Minimal left basilar linear atelectasis or scarring.    3/8 MRI Postsurgical change with prior laminectomy. 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 There findings suspicious for a disc ligamentous injury at this level. Correlation with CT is advised    3/7 IR No angiographic evidence of intracranial aneurysm, arteriovenous malformation or dural fistula. Is no angiographic evidence of spinal arteriovenous malformation or dural fistula.  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   - worsening of weakness due to Fracture dislocation C6-C7 with malpositioned hardware  - S/P C5-T1 posterior fusion, C5-C7 anterior fusion with partial C7 corpectomy on 3/18. cx normal flora     Other issues during admission:  Fever/SIRS on 3/12   Symptoms of aspiration with increased cough and mild-mod oropharyngeal dysphagia   Urinary retention, required reinsertion of foley and E. Faecalis UTI  Leukocytosis  DVT (S/P IVC filter)    Hypoxic respiratory failure overnight 3/21-3/22   Now with Near complete opacification of the left lung   Gallbladder wall thickening per imaging       Plan:   1. SIRS.   Due to UTI and aspiration pneumonia.  Now afebrile and overall hemodynamically more stable.     2. Recurrent Hypoxic respiratory failure and aspiration pneumonia .  Episode on 3/21-22 due to aspiration/malositioned Corpak (per  xray not in the stomach), required BiPAP and NRB. Sputum cx normal flora and Stenotrophomonas maltophilia.    Near complete opacification of the left lung on 3/27 xray , probably mucous plug after extubation.      Got re intubated and bronch on 3/27  . ' Copious thick white secretions were noted in the leftmainstem bronchus. Left airway mucosa was edematous and friable with some mucosal bleeding' . Sputum cx pending  S/P trach today     Continue levaquin for now.   Follow cx and adjust     3. E. Faecalis UTI.   Cath related   Foley was removed/then replaced x 2 given retention   Has finished the course.      4. 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   S/P C5-T1 posterior fusion, C5-C7 anterior fusion with partial C7 corpectomy on 3/18. cx normal flora     5. Leukocytosis.   Due to above.   Resolved.    6. Gallbladder wall thickening  No symptoms suggestive of cholecystitis for now     7. Goals of care.   Palliative care on board     Discussed with the nurse.      Signed by: [place]. [id number]  Infectious Disease  Phone: [phone]  Fax: [phone]

## 2015-09-08 NOTE — Progress Notes (Signed)
POD 11    S/p tracheostomy    Awake  Alert  Mouthing words  On trach      Cervical xrays 09/06/2015 stable alignment    A/P  Neuro stable  Continue supportive care

## 2015-09-09 DIAGNOSIS — A498 Other bacterial infections of unspecified site: Secondary | ICD-10-CM

## 2015-09-09 DIAGNOSIS — Z1624 Resistance to multiple antibiotics: Secondary | ICD-10-CM

## 2015-09-09 DIAGNOSIS — J9809 Other diseases of bronchus, not elsewhere classified: Secondary | ICD-10-CM

## 2015-09-09 LAB — CBC AND DIFFERENTIAL
Basophils Absolute Automated: 0 10*3/uL (ref 0.00–0.20)
Basophils Automated: 0 %
Eosinophils Absolute Automated: 0.06 10*3/uL (ref 0.00–0.70)
Eosinophils Automated: 1 %
Hematocrit: 34 % — ABNORMAL LOW (ref 42.0–52.0)
Hgb: 10.6 g/dL — ABNORMAL LOW (ref 13.0–17.0)
Immature Granulocytes Absolute: 0.03 10*3/uL
Immature Granulocytes: 0 %
Lymphocytes Absolute Automated: 0.98 10*3/uL (ref 0.50–4.40)
Lymphocytes Automated: 14 %
MCH: 31.3 pg (ref 28.0–32.0)
MCHC: 31.2 g/dL — ABNORMAL LOW (ref 32.0–36.0)
MCV: 100.3 fL — ABNORMAL HIGH (ref 80.0–100.0)
MPV: 10.9 fL (ref 9.4–12.3)
Monocytes Absolute Automated: 0.61 10*3/uL (ref 0.00–1.20)
Monocytes: 9 %
Neutrophils Absolute: 5.3 10*3/uL (ref 1.80–8.10)
Neutrophils: 76 %
Nucleated RBC: 0 /100 WBC (ref 0–1)
Platelets: 185 10*3/uL (ref 140–400)
RBC: 3.39 10*6/uL — ABNORMAL LOW (ref 4.70–6.00)
RDW: 15 % (ref 12–15)
WBC: 6.98 10*3/uL (ref 3.50–10.80)

## 2015-09-09 LAB — BASIC METABOLIC PANEL
BUN: 14 mg/dL (ref 9.0–28.0)
CO2: 32 mEq/L — ABNORMAL HIGH (ref 22–29)
Calcium: 8.1 mg/dL (ref 7.9–10.2)
Chloride: 104 mEq/L (ref 100–111)
Creatinine: 0.5 mg/dL — ABNORMAL LOW (ref 0.7–1.3)
Glucose: 131 mg/dL — ABNORMAL HIGH (ref 70–100)
Potassium: 3.2 mEq/L — ABNORMAL LOW (ref 3.5–5.1)
Sodium: 145 mEq/L (ref 136–145)

## 2015-09-09 LAB — GFR: EGFR: >60

## 2015-09-09 LAB — GLUCOSE WHOLE BLOOD - POCT
Whole Blood Glucose POCT: 135 mg/dL — ABNORMAL HIGH (ref 70–100)
Whole Blood Glucose POCT: 124 mg/dL — ABNORMAL HIGH (ref 70–100)

## 2015-09-09 LAB — MAGNESIUM: Magnesium: 1.9 mg/dL (ref 1.6–2.6)

## 2015-09-09 MED ORDER — DIPHENHYDRAMINE HCL 12.5 MG/5ML PO ELIX
12.5000 mg | ORAL_SOLUTION | Freq: Four times a day (QID) | ORAL | Status: DC | PRN
Start: 2015-09-09 — End: 2015-09-24
  Administered 2015-09-09: 12.5 mg via GASTROSTOMY
  Filled 2015-09-09 (×4): qty 5

## 2015-09-09 MED ORDER — FENTANYL 50 MCG/HR TD PT72
1.0000 | MEDICATED_PATCH | TRANSDERMAL | Status: DC
Start: 2015-09-09 — End: 2015-09-24
  Administered 2015-09-09 – 2015-09-24 (×6): 1 via TRANSDERMAL
  Filled 2015-09-09 (×6): qty 1

## 2015-09-09 MED ORDER — ALBUTEROL-IPRATROPIUM 2.5-0.5 (3) MG/3ML IN SOLN
3.0000 mL | Freq: Four times a day (QID) | RESPIRATORY_TRACT | Status: DC
Start: 2015-09-09 — End: 2015-09-24
  Administered 2015-09-09 – 2015-09-24 (×59): 3 mL via RESPIRATORY_TRACT
  Filled 2015-09-09 (×57): qty 3

## 2015-09-09 MED ORDER — DIPHENHYDRAMINE HCL 12.5 MG/5ML PO ELIX
12.5000 mg | ORAL_SOLUTION | Freq: Four times a day (QID) | ORAL | Status: DC | PRN
Start: 2015-09-09 — End: 2015-09-09
  Filled 2015-09-09 (×4): qty 5

## 2015-09-09 MED ORDER — GABAPENTIN 50 MG/ML UNIT DOSE
200.0000 mg | Freq: Every evening | ORAL | Status: DC
Start: 2015-09-09 — End: 2015-09-24
  Administered 2015-09-09 – 2015-09-23 (×14): 200 mg via GASTROSTOMY
  Filled 2015-09-09 (×18): qty 6

## 2015-09-09 MED ORDER — POTASSIUM CHLORIDE 20 MEQ PO PACK
40.0000 meq | PACK | Freq: Once | ORAL | Status: AC
Start: 2015-09-09 — End: 2015-09-09
  Administered 2015-09-09: 40 meq via ORAL
  Filled 2015-09-09: qty 2

## 2015-09-09 NOTE — Progress Notes (Signed)
Worker met w/pt's son@ bedside on 09/09/15.  Son is requesting a verification letter to be provided to insurance company. Worker has submitted referrals to SNFs that will accept a trach, and waiting on acceptances. (Notable that most "trach" SNFs will only accept a trach that is thirty days old or more). Information and verification letter to be provided to family.    Frederich Chick, LCSW  Case Management and Discharge Planning  [phone]

## 2015-09-09 NOTE — Progress Notes (Signed)
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 Jaynie Collins at [facility].   - s/p C6-7 ACDF, C6-C7 posterior fusion with lateral mass scews on 08/20/15 by Dr. Jaynie Collins.  - s/p  C5-T1 posterior fusion, C5-C7 anterior fusion with partial C7 corpectomy on 08/29/15 by Dr. Claudette Laws:  - NSG FU  - Hard collar at all times 2/2 neck weakness  - on Fentanyl patch      Chronic hypoxic/hypercarbic respiratory failure s/p trach 3/28:   - Bronched 3/27 for left lung collapse from mucus plugging in the setting of stenotrophomonas growth in sputum culture   - Weaned to trach mask. Will need long term trach for airway protection. Ventilatory support PRN.  - Aspiration pneumonia/atelectasis; fluid overload; poor secretions clearance, decrease FRC form prior spine surgery.  - Stenotrophomonas VAP, will complete course of Levaquin on 3/29  - Diuresis prn, IPV q4h  - PRN dilaudid for discomfort. Continues to appear uncomfortable. Likely 2/2 trach irritation.  - CXR post-trach c/w bibasilar atelectasis      Aspiration pneumonia  - sputum--> Stenotrophomonas.   - Will complete levaquin dose on 3/29 (no bactrim given sulfa allergy).   - New BAL sent 3/27--> Mixed upper resp flora   - No fever/leukocytosis  - Recent CXR on 3/28---> bibasilar opacities/atelectasis     E. Faecalis UTI.   Cath related   Foley was removed/then replaced x 2 given retention   Has finished the course      S/p PEG tube:   - Tolerating TF, resume today.   - Avoid constipation, Good BM. No rectal tone    Hypotension - chronic, in the setting of dysautonomia  - Continue Midodrine. Off Florinef  - Continue PRN diuresis as tolerated by BP  - stable now      AF: Rate controlled. Off AC.   - Re-address  anticoagulation with NSG on POD # 14 (09/12/2015)    Hypokalemia  - on oral KCL PRN  - closely monitor serum electrolytes    GI/DVT prophylaxis: on Pepcid; LMWH    Case discussed with:     Safety Checklist:     DVT prophylaxis:  CHEST guideline (See page [id number]) Lovenox   Foley:  Muse Rn Foley protocol yes   IVs:  piv   PT/OT: yes   Daily CBC & or Chem ordered:  SHM/ABIM guidelines (see #5) yes   Reference for approximate charges of common labs: CBC auto diff - $76  BMP - $99  Mg - $79    Lines:     Patient Lines/Drains/Airways Status    Active PICC Line / CVC Line / PIV Line / Drain / Airway / Intraosseous Line / Epidural Line / ART Line / Line / Wound / Pressure Ulcer / NG/OG Tube     Name:   Placement date:   Placement time:   Site:   Days:    Peripheral IV 08/29/15 Left Hand  08/29/15   1345   Hand   11    Peripheral IV 08/30/15 Right Antecubital  08/30/15   0030   Antecubital   10    Peripheral IV 09/08/15 Left Forearm  09/08/15   0400   Forearm   1  Peripheral IV 09/08/15 Right Hand  09/08/15   0400   Hand   1    Peripheral IV 09/08/15 Left Forearm  09/08/15   2200   Forearm   less than 1    Gastrostomy/Enterostomy Gastrostomy-jejunostomy  09/04/15         5    Urethral Catheter Temperature probe 16 Fr.  09/07/15   1300   Temperature probe   2    External Urinary Catheter  09/06/15   1230      3    Surgical Airway Shiley 6 mm  09/08/15   1110   6 mm   1    Wound 08/25/15 Pressure Injury Buttocks Left;Right Non-Blanchable Erythema  08/25/15   2258   Buttocks   14    Incision Site 08/20/15 Neck  08/20/15   1427     19    Incision Site 08/20/15 Neck  08/20/15   1932     19    Incision Site 08/28/15 Neck  08/28/15   2352     11    Incision Site 08/29/15 Neck  08/29/15   0515     11                 Disposition:     Today's date: 09/09/2015  Length of Stay: 23  Anticipated medical stability for discharge: 3- 4 days  Reason for ongoing hospitalization: quadriparesis  Anticipated discharge needs: TBD    Subjective      CC: Quadriplegia    Interval History/24 hour events: please see below    HPI/Subjective: Ernest Diaz is a 70 y.o. male hx afib on Eliquis + aspirin, HTN who presented to [facility] 2/25 after a fall. CT head and spine were negative so he was discharged. 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 MRI cervical and thoracic spine showed spinal epidural hematoma extending from the foramen magnum to the upper thoracic spine. Neurosurgery (Dr. Jaynie Collins) was consulted. His Eliquis was reversed. He ultimately had C3-C5 and T3-T4 decompressive laminectomies 2/27 by Dr. Jaynie Collins. He noted extensive dorsal epidural venous plexus and when he dissected out these vessels they bled. Dr. Jaynie Collins requested he be transferred here for spinal angiogram. These symptoms are sudden onset, severe intensity, without alleviating factors.     3/29. More stable at NS ICU neurologically, stable at trach collar, no fever/diarrhea. Tolerating tube feeding. Follows commands. Not in any distress.     Physical Exam:     VITAL SIGNS PHYSICAL EXAM   Temp:  [98.7 F (37.1 C)-99.4 F (37.4 C)] 99.2 F (37.3 C)  Heart Rate:  [56-83] 74  Resp Rate:  [14-43] 18  BP: (87-155)/(49-76) 110/55 mmHg  FiO2:  [40 %] 40 %  Blood Glucose:    Telemetry:       Intake/Output Summary (Last 24 hours) at 09/09/15 1416  Last data filed at 09/09/15 1200   Gross per 24 hour   Intake   1620 ml   Output   1055 ml   Net    565 ml    Physical Exam  General: awake, alert X 2, not in distress  HEENT- NC, AT, reactive pupils, no pallor/icterus.  Neck- on Aspen collar   Cardiovascular: regular rate and rhythm, no murmurs, rubs or gallops  Lungs: clear to auscultation bilaterally, without wheezing, rhonchi, or rales  Abdomen: soft, non-tender, non-distended; no palpable masses,  normoactive bowel sounds  Extremities: mild edema+ in both ankles,  no cyanosis/clubbng  CNS- cranial nerves- grossly intact, 3+/5 in both upper extremities, 0/5 in both lower  extremities  Skin- warm, no rashes         Meds:     Medications were reviewed:    Labs:     Labs (last 72 hours):      Recent Labs  Lab 09/09/15  0326 09/08/15  0329   WBC 6.98 6.54   HGB 10.6* 10.7*   HEMATOCRIT 34.0* 34.0*   PLATELETS 185 222         Recent Labs  Lab 09/04/15  0605   PT 15.6*   PT INR 1.2*      Recent Labs  Lab 09/09/15  0326 09/08/15  0329  09/04/15  0425 09/03/15  0520   SODIUM 145 145 More results in Results Review 143 149*   POTASSIUM 3.2* 3.1* More results in Results Review 4.5 4.6   CHLORIDE 104 101 More results in Results Review 102 107   CO2 32* 35* More results in Results Review 37* 36*   BUN 14.0 12.0 More results in Results Review 11.0 12.0   CREATININE 0.5* 0.6* More results in Results Review 0.5* 0.5*   CALCIUM 8.1 7.9 More results in Results Review 8.4 8.5   ALBUMIN  --   --   --  2.2* 2.3*   PROTEIN, TOTAL  --   --   --  5.5* 5.5*   BILIRUBIN, TOTAL  --   --   --  0.6 0.6   ALKALINE PHOSPHATASE  --   --   --  70 78   ALT  --   --   --  13 14   AST (SGOT)  --   --   --  15 18   GLUCOSE 131* 127* More results in Results Review 150* 135*   More results in Results Review = values in this interval not displayed.                Microbiology, reviewed  Imaging, reviewed    Signed by: Yisroel Ramming Man Verlin Grills, MD

## 2015-09-09 NOTE — Progress Notes (Signed)
ID PROGRESS NOTE    Date Time: 09/09/2015 9:07 AM  Patient Name: Ernest Diaz        Subjective, ROS:    Trach was placed  Fever subsiding  Currently off vent      Stable labs     Sleepy but communicative   Pain in Ernest neck  Denies SOB  Tolerating TF        Antibiotics and culture results:   Antibiotics: levaquin #5  Midodrine   S/P vanc , zosyn       Cultures:  3/27 BAL cx upper flora   3/22 sputum cx Stenotrophomonas maltophilia , mixed upper flora    3/20 flu negative   3/18 cervical wound cx cut flora  3/18 cervical wound anaerobic cx ngtd  3/18 cervical wound AFB and fungal cx P  3/13 Ucx >100,000 E. Faecalis   3/13 Blood cx Ngtd  3/6 MRSA negative     3/9 path FIBROVASCULAR TISSUE, FEW VESSELS AND BONE CHIPS       Lines: PIV access    Physical Exam:     Filed Vitals:    09/09/15 0900   BP: 147/65   Pulse: 67   Temp:    Resp:    SpO2: 100%       General: in bed, in ICU, sleepy but communicative. Off vent   HEENT: dry mucosa ,  pale, not icteric , cervical collar in place , trach in place   Chest: S1S2 irregular , crackles bilaterally,  no wheeze , BS at bases decreased.   Abdomen: soft, not tender , BS +, PEG in place   Ext: + edema   Weakness  No change    Foley in place       Labs:       Recent CBC WITH DIFF   Recent Labs      09/09/15   0326   WBC  6.98   RBC  3.39*   HGB  10.6*   HEMATOCRIT  34.0*   MCV  100.3*       Recent CMP   Recent Labs      09/09/15   0326   GLUCOSE  131*   BUN  14.0   CREATININE  0.5*   SODIUM  145   POTASSIUM  3.2*   CHLORIDE  104   CO2  32*           Rads:   3/28 xray New tracheostomy. Unchanged moderate pleural effusions and probable bibasilar atelectasis.    3/27 xray Near complete opacification of Ernest left lung which may be secondary to mucous plugging. Urgent findings discussed with, and read back by, nurse Misty Stanley, RN at [phone] on 09/07/2015. Stable right-sided pleural effusion and atelectasis    3/23 sono Similar findings as seen on recent CT. Thick-walled  bladder without significant distention with pericholecystic fluid. Lack of gallstones and clear focal tenderness makes cholecystitis less likely. Ernest patient was uncooperative for this portable examination however. Differential considerations would include hypoalbuminemia, chronic cholecystitis or acute cholecystitis. If continued clinical concern can perform HIDA scan. Bilateral effusions. Patient refused renal evaluation.    3/22 CT  Decompressed stomach located within Ernest anterior abdomen extending  below Ernest left hepatic lobe margin and above Ernest transverse colon.. Nonspecific gallbladder wall thickening with pericholecystic fluid and stranding in Ernest presence of small ascites. Recommend clinical correlation for acute cholecystitis.. Bilateral small effusions and basilar lower lobe consolidative atelectasis. Cardiac enlargement.    3/20  IRSuccessful deployment of a retrievable inferior vena cava filter    3/19 doppler Nonocclusive deep venous thrombosis involving a short segment of Ernest proximal femoral vein.    3/17 CT  Markedly abnormal examination with anterolisthesis of C6 upon C7  measuring 16 mm with a jumped facet on Ernest left and a comminuted displaced fracture involving Ernest right C7 articular facet. Anterior cervical spinal fusion hardware is malpositioned with both plate and screws extending into Ernest C6-C7 disc space.  Ernest AP dimension of Ernest bony spinal canal at Ernest superior C5 level is reduced to 5 mm.     3/17 xray No fracture acute osseous abnormality identified in Ernest right shoulder, humerus or forearm    3/8 MRI Postsurgical change with prior laminectomy. 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 There findings suspicious for a disc ligamentous injury at this level. Correlation with CT is advised    3/7 IR No angiographic evidence of intracranial aneurysm, arteriovenous malformation or dural fistula. Is no angiographic evidence of spinal arteriovenous  malformation or dural fistula.  20-30% stenosis of Ernest cervical left internal carotid arteries described.    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   - worsening of weakness due to Fracture dislocation C6-C7 with malpositioned hardware  - S/P C5-T1 posterior fusion, C5-C7 anterior fusion with partial C7 corpectomy on 3/18. cx normal flora     Other issues during admission:  Fever/SIRS on 3/12   Symptoms of aspiration with increased cough and mild-mod oropharyngeal dysphagia   Urinary retention, required reinsertion of foley and E. Faecalis UTI  Leukocytosis  DVT (S/P IVC filter)    Hypoxic respiratory failure overnight 3/21-3/22   Now with Near complete opacification of Ernest left lung   Gallbladder wall thickening per imaging       Plan:   1. SIRS.   Due to UTI and aspiration pneumonia.  Overall doing better.     2. Recurrent Hypoxic respiratory failure and aspiration pneumonia .  Episode on 3/21-22 due to aspiration/malositioned Corpak (per xray not in Ernest stomach), required BiPAP and NRB. Sputum cx normal flora and Stenotrophomonas maltophilia.    Near complete opacification of Ernest left lung on 3/27 xray , probably mucous plug after extubation.      S/P intubation and bronch on 3/27  . ' Copious thick white secretions were noted in Ernest leftmainstem bronchus. Left airway mucosa was edematous and friable with some mucosal bleeding' . Sputum cx normal flora     S/P trach 3/28     Continue levaquin for now to finish 8 day course.   Monitor respiratory status      3. E. Faecalis UTI.   Cath related   Foley was  removed/then replaced x 2 given retention   Has finished Ernest course.      4. 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   S/P C5-T1 posterior fusion, C5-C7 anterior fusion with partial C7 corpectomy on 3/18. cx normal flora     5. Leukocytosis.   Due to above.   Resolved.    6. Gallbladder wall thickening  No symptoms suggestive of cholecystitis for now     7. Goals of care.   Palliative care on board     Discussed with ICU team/          Signed by: [place]. [id number]  Infectious Disease  Phone: [phone]  Fax: [phone]

## 2015-09-09 NOTE — Consults (Signed)
Service Date: 09/09/2015     Patient Type: I     CONSULTING PHYSICIAN: Sherrie Mustache MD     REFERRING PHYSICIAN: Doristine Bosworth Dinescu MD     CONSULTING SERVICE:  Pulmonary.     REASON FOR CONSULTATION:  Evaluation of respiratory failure status post tracheostomy.     HISTORY OF PRESENT ILLNESS:  This patient is a 70 year old male with a history of atrial fibrillation  who had a traumatic fall requiring cervical and thoracic laminectomy at the  [facility].  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  He  developed weakness following the surgery involving the lower and upper  extremities.  He then underwent surgical evaluation and surgical  decompression.  He has been in the ICU ever since.  He had been extubated;  however, he developed significant mucous plugging and respiratory failure  and had to be reintubated, and underwent a tracheostomy yesterday.  He is  noted to be extremely weak in all 4 extremities and has an Biochemist, clinical in  place at this time.  He has copious secretions still and has been treated  with antibiotics, utilizing Levaquin for his Stenotrophomonas pneumonia.   He was recently bronchoscopied on March 27 for left lung collapse.     The patient is awake but unable to give any history.  History is obtained  from the chart.     REVIEW OF SYSTEMS:  Comprehensive review of all systems, including integumentary, HEENT,  neurologic, psychiatric, cardiovascular, respiratory, gastrointestinal,  genitourinary, hematologic, endocrinologic, musculoskeletal is negative  except what is mentioned in the HPI.     PAST MEDICAL HISTORY:  Significant for hypertension, arthritis, pericarditis, atrial fibrillation,  arrhythmia.     PAST SURGICAL HISTORY:  Significant for IVC filter placement, colonoscopy, eye surgery, and  decompression of cervical spine.     FAMILY  HISTORY:  Noncontributory.     SOCIAL HISTORY:  He is a former smoker.  He drinks 2 to 3 shots of liquor a week.  No  illicit drug use.     ALLERGIES:  Include SULFA.     MEDICATIONS:  Prior to admission include Elavil, amlodipine, multivitamins,  hydrochlorothiazide, triamterene and hydrochlorothiazide, Robaxin.     PHYSICAL EXAMINATION:  GENERAL:  He is alert and oriented x3, in no distress.  VITAL SIGNS:  Blood pressure 110/55, heart rate of 83, temperature 99.2,  respirations 28 per minute, saturation 100% on 40% FIO2 with PRVC.  HEENT:  Reveals pupils equal, round, reactive to light and accommodation.  NECK:  Veins are nondistended.  A tracheostomy is in place.  Neck collar is  in place.  CARDIOVASCULAR:  Reveals irregular heartbeat.    ABDOMEN:  Soft and nontender.  LUNGS:  Reveal bilateral coarse rales and rhonchi, no wheezing.  EXTREMITIES:  Reveal no edema.  NEUROLOGIC:  Reveals quadriparesis.     LABORATORY AND DIAGNOSTIC DATA:  Chest x-ray done yesterday showed a new tracheostomy with moderate pleural  effusions noted along with bibasilar atelectasis.  CBC shows a white count  of 6.98, hemoglobin 10.6, hematocrit 34, platelet count is 185.  Chemistry  shows sodium 145, potassium of 3.2, chloride of 104, CO2 of 32, BUN of 14,  creatinine 0.5, glucose of 131.  Sputum culture grew out Stenotrophomonas.     IMPRESSION:  1.  Respiratory failure status post tracheostomy.  2.  Recurrent mucous plugging.  3.  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  4.  Pleural effusion.     RECOMMENDATIONS:  1.  Continue with the ventilator support for the time being.  2.  Wean the ventilator off as quickly as possible and continue with  aggressive pulmonary toileting.  3.  Start the patient on albuterol and Atrovent nebulizers around-the-clock  given the thick secretions.  4.  The patient is completing his antibiotic course today.    5.  Continue with subcutaneous Lovenox for DVT prophylaxis.    6.  The patient has  an IVC filter in place as well.     Will follow.           D:  09/09/2015 13:37 PM by Dr. Sherrie Mustache, MD ([id number])  T:  09/09/2015 14:32 PM by Susa Day      (Conf: [id number]) (Doc ID: [id number])

## 2015-09-09 NOTE — Progress Notes (Signed)
Post-procedure Check     Ernest Diaz is a 70 y.o.  male s/p trach     Subjective:   Pt was seen and examined. Alert.  Nodding head.  Not endorsing pain. Trach- no issues with secretions.     Objective:   I/O this shift:  In: 390 [I.V.:60; NG/GT:330]  Out: 305 [Urine:305]     Filed Vitals:    09/09/15 0100   BP: 105/56   Pulse: 58   Temp:    Resp: 16   SpO2: 99%     Gen: Alert. Following commands. NAD  Cardio: RRR.   Pulm: CTAB. PRVC 40/5. Trach- no secretions    Assessment:   Ernest Diaz is a 70 y.o.  male s/p trach.     Plan:   -Trach:Continue trach care per nursing. Sutures ok to be discontinued in 7 days.  Surgery will sign off at this time. Please re-contact with questions.     Lew Dawes, MD  General Surgery Resident

## 2015-09-09 NOTE — Plan of Care (Signed)
Problem: Pain  Goal: Patient's pain/discomfort is manageable  Outcome: Progressing  Pt transitioned to fentanyl patch and benadryl for pain and discomfort related to tracheostomy and spinal decompressive laminectomy    Problem: Moderate/High Fall Risk Score >5  Goal: Patient will remain free of falls  Outcome: Progressing  Floor mats down, bed alarm on. Pt mobility is improving in his upper extremities, aware of limitations    Problem: Inadequate Gas Exchange  Goal: Adequately oxygenating and ventilation is improved  Outcome: Progressing  Pt tolerates trach suctioning when secretions build up in lower lobes.

## 2015-09-09 NOTE — Plan of Care (Signed)
Problem: Safety  Goal: Patient will be free from injury during hospitalization  Outcome: Progressing    Problem: Pain  Goal: Patient's pain/discomfort is manageable  Outcome: Progressing    Problem: Neurological Deficit  Goal: Neurological status is stable or improving  Outcome: Progressing

## 2015-09-09 NOTE — Progress Notes (Signed)
NSICU Fellow Progress Note    24 h events:   - Trach/PEG placed yesterday  - Agitated because of trach requiring several pushes of dilaudid    VS reviewed.  Neuro: opens eyes spont, Ox1, FC, L pupil 3 mm, brisk, R pupil post surgical, + corneals, FS, TM, weak cough and gag. UE spont AG; TF LE.  HEENT: no JVD  CV: reg S1S2  Lungs: b/l ronchi. On vent.   ABD: soft, + BS  Extr:+2 e LE; no c/c; SCDs    Meds, labs and imagistic studies reviewed.    A/P:    1: Chronic hypoxic/hypercarbic respiratory failure s/p trach 3/28:   - Bronched 3/27 for left lung collapse from mucus plugging in the setting of stenotrophomonas growth in sputum culture   - Weaned to trach mask. Will need long term trach for airway protection. Ventilatory support PRN.  - Aspiration pneumonia/atelectasis; fluid overload; poor secretions clearance, decrease FRC form prior spine surgery.  - Stenotrophomonas VAP, will complete course of Levaquin today  - Diuresis prn, FG AR today  - IPV q4h  - PRN dilaudid for discomfort. Continues to appear uncomfortable. Likely 2/2 trach irritation.  - CXR post-trach c/w bibasilar opacities/atelectasis    2: Encephalopathy: Improved now off seroquel    3: Traumatic cervicothoracic epidural hematoma s/p C3-C5 and T3-T4 decompressive laminectomies for EDH evacuation on 08/10/15 by Ernest Diaz at [facility]. C6-7 ACDF, C6-C7 posterior fusion with lateral mass scews on 08/20/15 by Ernest Diaz. C5-T1 posterior fusion, C5-C7 anterior fusion with partial C7 corpectomy on 08/29/15 by Ernest Diaz:  - NSG FU  - Hard collar at all times 2/2 neck weakness    4: AF: Rate controlled. Off AC.   - Re-address anticoagulation with NSG on POD#14 (09/12/2015)    5: S/p PEG tube:   - Tolerating TF, resume today.   - Avoid constipation, Good BM. No rectal tone.     6: ID - Stenotrophomonas pneumonia.   - Will complete levaquin dose today (no bactrim given sulfa allergy).   - New BAL sent 3/27, FU results   - E fecalis UTI is treated    7: Urinary retention -  persistent secondary to spinal cord injury.  - Off cardura, continue terazosin    8: Hypotension - chronic, in the setting of dysautonomia  - Continue Midodrine. Off Florinef  - Continue PRN diuresis as tolerated by BP    9: GI/DVT prophylaxis: on Pepcid; LMWH.    10: Dispo: Transfer to [facility]  - Accepting physician Dr. Sherene Sires  - Pulmonologist to follow for ventilator wean as needed Dr. Mittie Bodo Hamda    Date/Time: 09/09/2015  8:46 AM    [facility]  Attending MD [phone] (24hr)   Attending MD #2 - [phone]  [facility] PA/NP [phone], [phone] (7a-7p)        [facility] Attending    Patient is currently able to make medical decisions.  Discussed current diagnoses, prognosis and goals of care.  Code status was also discussed and is limited .    I have personally reviewed the patient's history and 24 hour interval events, along with vitals, labs, radiology images and ventilator settings  and additional findings found in detail within ICU team notes from house staff, NPPs and nursing, with their care plans developed with and reviewed by me.   Pt continues to be quadriarethic, in acute respiratory failure on MV, s/p tracheostomy and will need prolonged rehab.  Plan is to cont daily SAT/TC trials,  complete course of abx for stenotrophomonas pneumonia, and most importantly provide agressive PT.  A/C to be addressed with NS team on 4/1.     Dr Lemar Livings will follow for trach/MV management on [facility].     So far today I have spent  38 minutes providing critical  care for this patient excluding teaching and billable procedures, and not overlapping with any other providers.

## 2015-09-10 LAB — CBC AND DIFFERENTIAL
Basophils Absolute Automated: 0.01 10*3/uL (ref 0.00–0.20)
Basophils Automated: 0 %
Eosinophils Absolute Automated: 0.06 10*3/uL (ref 0.00–0.70)
Eosinophils Automated: 1 %
Hematocrit: 33.5 % — ABNORMAL LOW (ref 42.0–52.0)
Hgb: 10 g/dL — ABNORMAL LOW (ref 13.0–17.0)
Immature Granulocytes Absolute: 0.05 10*3/uL
Immature Granulocytes: 1 %
Lymphocytes Absolute Automated: 1 10*3/uL (ref 0.50–4.40)
Lymphocytes Automated: 13 %
MCH: 30.6 pg (ref 28.0–32.0)
MCHC: 29.9 g/dL — ABNORMAL LOW (ref 32.0–36.0)
MCV: 102.4 fL — ABNORMAL HIGH (ref 80.0–100.0)
MPV: 11.5 fL (ref 9.4–12.3)
Monocytes Absolute Automated: 0.6 10*3/uL (ref 0.00–1.20)
Monocytes: 8 %
Neutrophils Absolute: 5.73 10*3/uL (ref 1.80–8.10)
Neutrophils: 77 %
Nucleated RBC: 0 /100 WBC (ref 0–1)
Platelets: 176 10*3/uL (ref 140–400)
RBC: 3.27 10*6/uL — ABNORMAL LOW (ref 4.70–6.00)
RDW: 15 % (ref 12–15)
WBC: 7.45 10*3/uL (ref 3.50–10.80)

## 2015-09-10 LAB — BASIC METABOLIC PANEL
BUN: 13 mg/dL (ref 9.0–28.0)
CO2: 37 mEq/L — ABNORMAL HIGH (ref 22–29)
Calcium: 8.1 mg/dL (ref 7.9–10.2)
Chloride: 105 mEq/L (ref 100–111)
Creatinine: 0.5 mg/dL — ABNORMAL LOW (ref 0.7–1.3)
Glucose: 120 mg/dL — ABNORMAL HIGH (ref 70–100)
Potassium: 3.7 mEq/L (ref 3.5–5.1)
Sodium: 146 mEq/L — ABNORMAL HIGH (ref 136–145)

## 2015-09-10 LAB — GFR: EGFR: >60

## 2015-09-10 MED ORDER — MORPHINE SULFATE (CONCENTRATE) 20 MG/ML PO SOLN
10.0000 mg | ORAL | Status: DC | PRN
Start: 2015-09-10 — End: 2015-09-20
  Administered 2015-09-10 – 2015-09-20 (×17): 10 mg via GASTROSTOMY
  Filled 2015-09-10 (×17): qty 1

## 2015-09-10 MED ORDER — LEVOFLOXACIN IN D5W 500 MG/100ML IV SOLN
500.0000 mg | INTRAVENOUS | Status: AC
Start: 2015-09-10 — End: 2015-09-11
  Administered 2015-09-10 – 2015-09-11 (×2): 500 mg via INTRAVENOUS
  Filled 2015-09-10 (×2): qty 100

## 2015-09-10 NOTE — Progress Notes (Signed)
S:   CC: Denies complaints    O:  Alert, no apparent distress  Pleasant  Rigid cervical collar in place  Motor: RUE 4/5, LUE 4-/5 except handgrips R 3/5, L 1/5  No movement of BLE  +sensation BLE    A/P  S/p C3-C5 and T3-T4 decompressive laminectomies for EDH evacuation on 08/10/15 by Dr. Jaynie Collins at [facility]  C6-7 ACDF, C6-C7 posterior fusion with lateral mass scews on 08/20/15 by Dr. Jaynie Collins at [facility]  C5-T1 posterior fusion, C5-C7 anterior fusion with partial C7 corpectomy on 08/29/15 by Dr. Claudette Laws      POD 12    Able to mouth words  Trach mask  Mood appears to be somewhat improved  Dangled at EOB today  Son reports some movement of feet "back and forth"      Discussed patient with Dr. Jaynie Collins

## 2015-09-10 NOTE — PT Eval Note (Signed)
[facility]   Physical Therapy Reassessment     Patient: Ernest Diaz    MRN#: [id number]   Unit: [facility] TOWER 7  Bed: F715/F715.01    Discharge Recommendations:     Discharge Recommendation: Acute Rehab   DME Recommendation: tbd    If Acute Rehab recommended discharge disposition is not available, patient will need max assist for mobility, hospital bed, hoyer, wc equipment, and home PT.        Assessment:   Ernest Diaz is a 70 y.o. male admitted 08/17/2015 with paraplegia, decreased balance limiting functional mobility. Pt with 3/5 strength B UEs except 0/5 grip. No active movement in B LEs. Pt able to transfer to EOB with max a then maintain EOB balance with min a. Good effort throughout Rx. Pt will benefit from cont PT to address deficits.     Therapy Diagnosis: weakness    Rehabilitation Potential: good    Treatment Activities: re-eval   Educated the patient to role of physical therapy, plan of care, goals of therapy and safety with mobility and ADLs.    Plan:   PT Frequency: 3-4x/wk    Treatment/Interventions: therapeutic exercise, gait and balance training, neuro re-ed    Risks/benefits/POC discussed with patient       Reason for reassessment: transferred to NSICU with aspiration PNA 3/22, PEG placed 3/24 (intubated/extubated for procedure), re-intubated 3/27, trach placed 3/28    Precautions and Contraindications:   Falls, aspen     Consult received for Ernest Diaz for PT Evaluation and Treatment.  Patient's medical condition is appropriate for Physical Therapy intervention at this time.      History of Present Illness:   Updated History of Present Illness: Ernest Diaz is a 70 y.o. male admitted from OSH s/p fall. Pt found to have epidural hematoma and underwent C3-5 and T3-4 lami at OSH.  On 3/09 pt s/p C6-7 lami/fusion. On 3/17 pt developed paraplegia and was transferred to ICU with hardware failure. Re-do  lami/fusion C5-T1 performed 3/18. IV filter placed 3/20. PEG placed 3/24. Trach 3/28.     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 Decompressed stomach located within the anterior abdomen extending  below the left hepatic lobe margin and above the transverse colon.  2. Nonspecific gallbladder wall thickening with pericholecystic fluid  and stranding in the presence of small ascites. Recommend clinical  correlation for acute cholecystitis.  3. Bilateral small effusions and basilar lower lobe consolidative  atelectasis.  4. Cardiac enlargement.       Subjective:   Patient is agreeable to participation in the therapy session.     Patient Goal: none stated    Pain:   Scale: 3/10  Location: abd  Intervention: RN aware    Objective:   Patient is in bed with telemetry, aspen, trach, trach collar, foley and SCD's in place.    Cognitive Status and Neuro Exam:  Alert and following directions  Mouthing words, trying to talk over trach  No active movement LEs    Musculoskeletal Examination  RUE ROM: wfl  LUE ROM: wfl  RLE ROM: wfl  LLE ROM: wfl    RUE Strength: 3/5 wrist, elbow, shoulder, 0/5 grip   LUE Strength: 3/5 wrist, elbow, shoulder, 0/5 grip  RLE Strength: 0/5  LLE Strength: 0/5  Functional Mobility  Rolling: max a  Supine to Sit: max a  Scooting: max a  Sit to Stand: nt  Stand to Sit: nt  Transfers: nt    Ambulation  Level of assistance required: nt  Ambulation Distance: nt  Pattern: nt  Device Used: nt  Weightbearing Status: nt  Stair Management: nt  Number of Stairs: nt  Door Management: nt  Wheelchair Management: nt    Armed forces operational officer Sitting: fair+ (min a)  Dynamic Sitting: nt  Engineer, water Standing: nt  Dynamic Standing: nt    Participation and Activity Tolerance  Participation Effort: good  Endurance: fair+    Patient left with call bell within reach, all  needs met, SCDs on, fall mat down, bed alarm on and all questions answered. RN notified of session outcome and patient response. Pt left with RN in room.    Goals:  Goals  Goal Formulation: With patient/family  Time for Goal Acheivement: 7 visits  Goals: Select goal  Pt Will Go Supine To Sit: with minimal assist  Pt Will Perform Sit To Supine: with moderate assist  Pt Will Sit Edge of Bed: 11-15 min, with supervision  Pt Will Achieve Sitting Balance: 4/5 moves/returns trunkal midpoint 1-2 inches in multiple planes  Pt Will Perform Home Exer Program:  (family will be independent w/LE ROM)      Marylou Flesher, DPT    Time of Treatment:  PT Received On: 09/10/15  Start Time: 1500  Stop Time: 1530  Time Calculation (min): 30 min

## 2015-09-10 NOTE — SLP Progress Note (Signed)
[facility]     Patient:  Ernest Diaz MRN#:  [id number]  Unit:  [facility] TOWER 7 Room/Bed:  F715/F715.01    09/10/2015  Time: 0856    Pt will be s/p 48 hours with trach this AM, which would make him candidate for Passy-Muir Speaking Valve trials. Please order if desired and/or medically appropriate.    Thank You,    Lenon Ahmadi, MS, CCC-SLP

## 2015-09-10 NOTE — Plan of Care (Signed)
[facility]  Chart reviewed. Still getting IV dilaudid 0.5 frequently and percocet was added.Will discontinue IV dilaudid and oxycodoen in favor of morphine elixir easier with PEG tube - 10mg  via PEG Q4h prn pain.     Can also increase Neurontin to 300mg  po Q12.  Seeing at intervals.    Jearld Lesch. Pennelope Bracken, MD  [facility] Palliative Medicine  Team Pager [phone] available 24/7  Coordinator [employer] [phone] between 9-4:30pm M-F

## 2015-09-10 NOTE — Progress Notes (Signed)
PROGRESS NOTE    Date Time: 09/10/2015 6:39 PM  Patient Name: Ernest Diaz      Assessment:   1. Respiratory failure status post tracheostomy.  2. Recurrent mucous plugging with recent pneumonia.  3. 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  4. Pleural effusion.    Plan:   Able to come off vent.  Cont trach collar.  Cont Duonebs.  Repeat CXR if desaturation occurs.    Subjective:   Afebrile on trach collar.    Medications:     Current Facility-Administered Medications   Medication Dose Route Frequency   . albuterol-ipratropium  3 mL Nebulization Q6H SCH   . balsam peru-castor oil (VENELEX)   Topical Q12H SCH   . enoxaparin  40 mg Subcutaneous Daily   . famotidine  20 mg Oral Q12H SCH    Or   . famotidine  20 mg Intravenous Q12H SCH   . fentaNYL  1 patch Transdermal Q72H   . gabapentin  200 mg per G tube QHS   . levoFLOXacin  500 mg Intravenous Q24H SCH   . midodrine  10 mg Oral TID MEALS   . terazosin  1 mg Oral QHS       Review of Systems:   A comprehensive review of systems was: Negative    Physical Exam:     Filed Vitals:    09/10/15 1600   BP: 112/52   Pulse: 64   Temp:    Resp:    SpO2: 96%   99.1  18    Intake and Output Summary (Last 24 hours) at Date Time    Intake/Output Summary (Last 24 hours) at 09/10/15 1839  Last data filed at 09/10/15 1800   Gross per 24 hour   Intake   1460 ml   Output    995 ml   Net    465 ml       General appearance - acyanotic, in no respiratory distress  Mental status - drowsy  Eyes - pupils equal and reactive, extraocular eye movements intact  Ears - not examined  Nose - normal and patent, no erythema, discharge or polyps  Mouth - mucous membranes moist, pharynx normal without lesions  Neck - supple, no significant adenopathy and neck collar in place  Lymphatics - no palpable lymphadenopathy, no hepatosplenomegaly  Chest - rhonchi noted bilaterally  Heart - normal rate, regular rhythm, normal S1, S2, no murmurs, rubs, clicks or gallops  Abdomen -  soft, nontender, nondistended, no masses or organomegaly  Extremities - peripheral pulses normal, no pedal edema, no clubbing or cyanosis    Labs:     Results     Procedure Component Value Units Date/Time    Basic Metabolic Panel [[id number]]  (Abnormal) Collected:  09/10/15 0418    Specimen Information:  Blood Updated:  09/10/15 0620     Glucose 120 (H) mg/dL      BUN 40.9 mg/dL      Creatinine 0.5 (L) mg/dL      Calcium 8.1 mg/dL      Sodium 811 (H) mEq/L      Potassium 3.7 mEq/L      Chloride 105 mEq/L      CO2 37 (H) mEq/L     GFR [[id number]] Collected:  09/10/15 0418     EGFR >60.0 Updated:  09/10/15 0620    CBC and differential [[id number]]  (Abnormal) Collected:  09/10/15 0418    Specimen Information:  Blood from Blood Updated:  09/10/15 0536     WBC 7.45 x10 3/uL      Hgb 10.0 (L) g/dL      Hematocrit 82.9 (L) %      Platelets 176 x10 3/uL      RBC 3.27 (L) x10 6/uL      MCV 102.4 (H) fL      MCH 30.6 pg      MCHC 29.9 (L) g/dL      RDW 15 %      MPV 11.5 fL      Neutrophils 77 %      Lymphocytes Automated 13 %      Monocytes 8 %      Eosinophils Automated 1 %      Basophils Automated 0 %      Immature Granulocyte 1 %      Nucleated RBC 0 /100 WBC      Neutrophils Absolute 5.73 x10 3/uL      Abs Lymph Automated 1.00 x10 3/uL      Abs Mono Automated 0.60 x10 3/uL      Abs Eos Automated 0.06 x10 3/uL      Absolute Baso Automated 0.01 x10 3/uL      Absolute Immature Granulocyte 0.05 x10 3/uL           Recent CBC   Recent Labs      09/10/15   0418   RBC  3.27*   HGB  10.0*   HEMATOCRIT  33.5*   MCV  102.4*   MCH  30.6   MCHC  29.9*   RDW  15   MPV  11.5     Recent BMP   Recent Labs      09/10/15   0418   GLUCOSE  120*   BUN  13.0   CREATININE  0.5*   CALCIUM  8.1   SODIUM  146*   POTASSIUM  3.7   CHLORIDE  105   CO2  37*       Rads:   Radiological Procedure reviewed.    Signed by: Sherrie Mustache, MD

## 2015-09-10 NOTE — Progress Notes (Signed)
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 Jaynie Collins at [facility].   - s/p C6-7 ACDF, C6-C7 posterior fusion with lateral mass scews on 08/20/15 by Dr. Jaynie Collins.  - s/p  C5-T1 posterior fusion, C5-C7 anterior fusion with partial C7 corpectomy on 08/29/15 by Dr. Claudette Laws:  - NSG FU  - Hard collar at all times 2/2 neck weakness  - on Fentanyl patch  - F/U Palliative team for pain management.      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 Will need long term trach for airway protection. Ventilatory support PRN.  - Aspiration pneumonia/atelectasis; fluid overload; poor secretions clearance, decrease FRC form prior spine surgery.  - Stenotrophomonas VAP, will complete course of Levaquin on 3/29  - Diuresis prn, IPV q4h  - PRN dilaudid for discomfort. Continues to appear uncomfortable. Likely 2/2 trach irritation.  - CXR post-trach c/w bibasilar atelectasis      Aspiration pneumonia  - sputum--> Stenotrophomonas.   - Will complete levaquin dose on 3/31 (no bactrim given sulfa allergy).   - New BAL sent 3/27--> Mixed upper resp flora   - No fever/leukocytosis  - Recent CXR on 3/28---> bibasilar opacities/atelectasis     E. Faecalis UTI.   Cath related   Foley was removed/then replaced x 2 given retention   Has finished the course      S/p PEG tube:   - Tolerating TF.   - Avoid constipation, Good BM. No rectal tone    Hypotension - chronic, in the setting of dysautonomia  - Continue Midodrine. Off Florinef  - Continue PRN diuresis as tolerated by BP  - stable now      AF: Rate controlled.  Off AC.   - Re-address anticoagulation with NSG on POD # 14 (09/12/2015)    Hypokalemia  - on oral KCL PRN, resolved  - closely monitor serum electrolytes    GI/DVT prophylaxis: on Pepcid; LMWH    Case discussed with:     Safety Checklist:     DVT prophylaxis:  CHEST guideline (See page [id number]) Lovenox   Foley:  [place] Rn Foley protocol yes   IVs:  piv   PT/OT: yes   Daily CBC & or Chem ordered:  SHM/ABIM guidelines (see #5) yes   Reference for approximate charges of common labs: CBC auto diff - $76  BMP - $99  Mg - $79    Lines:     Patient Lines/Drains/Airways Status    Active PICC Line / CVC Line / PIV Line / Drain / Airway / Intraosseous Line / Epidural Line / ART Line / Line / Wound / Pressure Ulcer / NG/OG Tube     Name:   Placement date:   Placement time:   Site:   Days:    Peripheral IV 08/29/15 Left Hand  08/29/15   1345   Hand   11    Peripheral IV 08/30/15 Right Antecubital  08/30/15   0030   Antecubital   10    Peripheral IV 09/08/15 Left Forearm  09/08/15   0400  Forearm   1    Peripheral IV 09/08/15 Right Hand  09/08/15   0400   Hand   1    Peripheral IV 09/08/15 Left Forearm  09/08/15   2200   Forearm   less than 1    Gastrostomy/Enterostomy Gastrostomy-jejunostomy  09/04/15         5    Urethral Catheter Temperature probe 16 Fr.  09/07/15   1300   Temperature probe   2    External Urinary Catheter  09/06/15   1230      3    Surgical Airway Shiley 6 mm  09/08/15   1110   6 mm   1    Wound 08/25/15 Pressure Injury Buttocks Left;Right Non-Blanchable Erythema  08/25/15   2258   Buttocks   14    Incision Site 08/20/15 Neck  08/20/15   1427     19    Incision Site 08/20/15 Neck  08/20/15   1932     19    Incision Site 08/28/15 Neck  08/28/15   2352     11    Incision Site 08/29/15 Neck  08/29/15   0515     11                 Disposition:     Today's date: 09/10/2015  Length of Stay: 24  Anticipated medical stability for discharge: 3- 4 days  Reason for ongoing hospitalization: quadriparesis  Anticipated  discharge needs: TBD    Subjective     CC: Quadriplegia    Interval History/24 hour events: please see below    HPI/Subjective: Ernest Diaz is a 70 y.o. male hx afib on Eliquis + aspirin, HTN who presented to [facility] 2/25 after a fall. CT head and spine were negative so he was discharged. 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 MRI cervical and thoracic spine showed spinal epidural hematoma extending from the foramen magnum to the upper thoracic spine. Neurosurgery (Dr. Jaynie Collins) was consulted. His Eliquis was reversed. He ultimately had C3-C5 and T3-T4 decompressive laminectomies 2/27 by Dr. Jaynie Collins. He noted extensive dorsal epidural venous plexus and when he dissected out these vessels they bled. Dr. Jaynie Collins requested he be transferred here for spinal angiogram. These symptoms are sudden onset, severe intensity, without alleviating factors.     3/29. More stable at NS ICU neurologically, stable at trach collar, no fever/diarrhea. Tolerating tube feeding. Follows commands. Not in any distress.   3/30. More alert and awake, follows commands. No any acute events noted. Family at bed side.    Physical Exam:     VITAL SIGNS PHYSICAL EXAM   Temp:  [98.6 F (37 C)-99.3 F (37.4 C)] 99.1 F (37.3 C)  Heart Rate:  [62-77] 65  Resp Rate:  [16-26] 18  BP: (96-153)/(52-86) 102/57 mmHg  FiO2:  [40 %] 40 %  Blood Glucose:    Telemetry:       Intake/Output Summary (Last 24 hours) at 09/10/15 1345  Last data filed at 09/10/15 1200   Gross per 24 hour   Intake   1375 ml   Output   1065 ml   Net    310 ml    Physical Exam  General: awake, alert X 3, not in distress  HEENT- NC, AT, reactive pupils, no pallor/icterus.  Neck- on Aspen collar, trach tube in place- site: dry and clean   Cardiovascular: regular rate and rhythm, no murmurs, rubs  or gallops  Lungs: clear to auscultation bilaterally, without wheezing, rhonchi, or rales  Abdomen: soft, non-tender, non-distended; no palpable masses,   normoactive bowel sounds. PEG tube site- clean/dry/no leakage  Extremities: mild edema+ in both ankles, no cyanosis/clubbng  CNS- cranial nerves- grossly intact, 3+/5 in both upper extremities, 0/5 in both lower extremities  Skin- warm, no rashes         Meds:     Medications were reviewed:    Labs:     Labs (last 72 hours):      Recent Labs  Lab 09/10/15  0418 09/09/15  0326   WBC 7.45 6.98   HGB 10.0* 10.6*   HEMATOCRIT 33.5* 34.0*   PLATELETS 176 185         Recent Labs  Lab 09/04/15  0605   PT 15.6*   PT INR 1.2*      Recent Labs  Lab 09/10/15  0418 09/09/15  0326  09/04/15  0425   SODIUM 146* 145 More results in Results Review 143   POTASSIUM 3.7 3.2* More results in Results Review 4.5   CHLORIDE 105 104 More results in Results Review 102   CO2 37* 32* More results in Results Review 37*   BUN 13.0 14.0 More results in Results Review 11.0   CREATININE 0.5* 0.5* More results in Results Review 0.5*   CALCIUM 8.1 8.1 More results in Results Review 8.4   ALBUMIN  --   --   --  2.2*   PROTEIN, TOTAL  --   --   --  5.5*   BILIRUBIN, TOTAL  --   --   --  0.6   ALKALINE PHOSPHATASE  --   --   --  70   ALT  --   --   --  13   AST (SGOT)  --   --   --  15   GLUCOSE 120* 131* More results in Results Review 150*   More results in Results Review = values in this interval not displayed.                Microbiology, reviewed  Imaging, reviewed    Signed by: Yisroel Ramming Man Verlin Grills, MD

## 2015-09-10 NOTE — Plan of Care (Signed)
Problem: Mechanical Ventilation  Goal: Tracheostomy will be maintained  Outcome: Progressing  Diminished at base, rhonchus bilateral UL.  Continue to be on trach collar, tolerating it well.  Pt desaturated twice during the day into the high 80's.  Suctioned, turned and encouraged pt to cough with good respond, sat >93% at this time.  Suctioned pt q1-2h with, white, thick, frothy secretions through out the day.  Trach care done, inner canula changed.   Intervention: Trach care every shift and as needed  QS or PRN

## 2015-09-10 NOTE — Consults (Addendum)
WOC Nurse Wound Consult:     Ernest Diaz is a 70 y.o. male admitted with Quadriplegia.  WOC RN consulted for sacral wound.         LOS:  LOS: 24 days   POD: 6 Days Post-Op    PMH:   Past Medical History   Diagnosis Date   . Hypertension    . Arthritis    . Pericarditis 1975   . Atrial fibrillation    . Arrhythmia    . 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 Vein surgery     . Colonoscopy       x2   . 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 Eye surgery  1960     Rt eye removed   . Decompression, anterior cervical, fusion, levels 2 N/A 08/20/2015     Procedure: DECOMPRESSION, ANTERIOR CERVICAL, FUSION, LEVELS 2;  Surgeon: Madaline Savage, MD;  Location: [place] [facility];  Service: Neurosurgery;  Laterality: N/A;  C5-C7 ACDF; AVM REMOVAL   . Decompression, posterior cervical, fusion, levels 2 N/A 08/20/2015     Procedure: DECOMPRESSION, POSTERIOR CERVICAL, FUSION, LEVELS 2;  Surgeon: Madaline Savage, MD;  Location: [place] [facility];  Service: Neurosurgery;  Laterality: N/A;   . Ivc filter placement N/A 08/31/2015     Procedure: IVC FILTER PLACEMENT;  Surgeon: Leeroy Bock, MD;  Location: [facility];  Service: Interventional Radiology;  Laterality: N/A;   . Fusion, anterior cervical, level 1 N/A 08/28/2015     Procedure: RE-DO ANTERIOR CERVICAL FUSION C6-7;  Surgeon: Brunetta Genera, MD;  Location: [facility];  Service: Neurosurgery;  Laterality: N/A;  RE-DO ANTERIOR CERVICAL FUSION C6-7  REMOVAL OF ANTERIOR HARDWARE, C6-C7; POSTERIOR CERVICAL DECOMPRESSION, REDUCTION OF FRACTURE, FUSION C5-T1 WITH INSTRUMENTATION; ANTERIOR CERVICAL CORPECTOMY AND FUSION C5-C7 WITH INSTRUMENTATION.       Braden Score: Braden Scale Score: 13 (09/09/15 2000)    Specialty Bed:    [facility]. Sizewise  Immersion ordered. Confirmation # [id number].        Wound Assessment:    Pt presents off loaded to side, heels floated.  Pt has blanchable erythema over posterior thighs near gluteal fold, as well as over upper buttocks.  Pt has scar lumbar/sacral region and has deep cleft, within cleft near coccyx pt has 2.5 cm x 1 cm dark purple, near this over L buttock pt has several areas of dark purple, some areas of which are open with red base revealed, measuring ~ 2 cm x 1.5 cm, over L buttock pt also has some superficial tissue loss, red base, ~ 2 x 2.5 cm. Pt does have blanchable erythema surrounding. Pt had small amount of stool. Cleaned pt, patted dry. Applied ordered Venelex to all red, purple, and open areas. Applied foam dressing. Repositioned pt.  Recommendations/Plan:     Immersion bed, Continue with Venelex to all red, purple or open areas twice daily, protect coccyx with foam dressing.  Continue q 2 hour turning, keeping skin free from stool and urine, heel floating.  Discussed with pt's NSICU RN.    Bed ordered. Will continue to follow.    Alver Fisher, RN, [facility]  Spect[employer]Link [id number]  Pager [phone]

## 2015-09-10 NOTE — Progress Notes (Signed)
ID PROGRESS NOTE    Date Time: 09/10/2015 10:40 AM  Patient Name: [facility]        Subjective, ROS:    Afebrile, Tmax 99.3  Stable labs     Off the vent   Still requires suctioning  One episode of desaturation   No other issues   Tolerating TF      Antibiotics and culture results:   Antibiotics: levaquin #6  Midodrine   S/P vanc , zosyn       Cultures:  3/27 BAL cx upper flora   3/22 sputum cx Stenotrophomonas maltophilia , mixed upper flora    3/20 flu negative   3/18 cervical wound cx cut flora  3/18 cervical wound anaerobic cx ngtd  3/18 cervical wound AFB and fungal cx Ngtd  3/13 Ucx >100,000 E. Faecalis   3/13 Blood cx Ngtd  3/6 MRSA negative     3/9 path FIBROVASCULAR TISSUE, FEW VESSELS AND BONE CHIPS       Lines: PIV access    Physical Exam:     Filed Vitals:    09/10/15 1034   BP: 140/69   Pulse: 63   Temp:    Resp:    SpO2:        General: in bed, in ICU, awake and  communicative. Off vent , smiling   HEENT: slightly dry mucosa ,  pale, not icteric , cervical collar in place , trach in place   Chest: S1S2 irregular , crackles bilaterally,  no wheeze , BS at bases decreased.   Abdomen: soft, not tender , BS +, PEG in place   Ext: + edema   Weakness  No change    Foley in place       Labs:       Recent CBC WITH DIFF   Recent Labs      09/10/15   0418   WBC  7.45   RBC  3.27*   HGB  10.0*   HEMATOCRIT  33.5*   MCV  102.4*       Recent CMP   Recent Labs      09/10/15   0418   GLUCOSE  120*   BUN  13.0   CREATININE  0.5*   SODIUM  146*   POTASSIUM  3.7   CHLORIDE  105   CO2  37*           Rads:   3/28 xray New tracheostomy. Unchanged moderate pleural effusions and probable bibasilar atelectasis.    3/27 xray Near complete opacification of the left lung which may be secondary to mucous plugging. Urgent findings discussed with, and read back by, nurse Misty Stanley, RN at [phone] on 09/07/2015. Stable right-sided pleural effusion and atelectasis    3/23 sono Similar findings as seen on recent CT.  Thick-walled bladder without significant distention with pericholecystic fluid. Lack of gallstones and clear focal tenderness makes cholecystitis less likely. The patient was uncooperative for this portable examination however. Differential considerations would include hypoalbuminemia, chronic cholecystitis or acute cholecystitis. If continued clinical concern can perform HIDA scan. Bilateral effusions. Patient refused renal evaluation.    3/22 CT  Decompressed stomach located within the anterior abdomen extending  below the left hepatic lobe margin and above the transverse colon.. Nonspecific gallbladder wall thickening with pericholecystic fluid and stranding in the presence of small ascites. Recommend clinical correlation for acute cholecystitis.. Bilateral small effusions and basilar lower lobe consolidative atelectasis. Cardiac enlargement.    3/20 IRSuccessful deployment  of a retrievable inferior vena cava filter    3/19 doppler Nonocclusive deep venous thrombosis involving a short segment of the proximal femoral vein.    3/17 CT  Markedly abnormal examination with anterolisthesis of C6 upon C7  measuring 16 mm with a jumped facet on the left and a comminuted displaced fracture involving the right C7 articular facet. Anterior cervical spinal fusion hardware is malpositioned with both plate and screws extending into the C6-C7 disc space.  The AP dimension of the bony spinal canal at the superior C5 level is reduced to 5 mm.     3/17 xray No fracture acute osseous abnormality identified in the right shoulder, humerus or forearm    3/8 MRI Postsurgical change with prior laminectomy. 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 There findings suspicious for a disc ligamentous injury at this level. Correlation with CT is advised    3/7 IR No angiographic evidence of intracranial aneurysm, arteriovenous malformation or dural fistula. Is no angiographic evidence of spinal  arteriovenous malformation or dural fistula.  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   - worsening of weakness due to Fracture dislocation C6-C7 with malpositioned hardware  - S/P C5-T1 posterior fusion, C5-C7 anterior fusion with partial C7 corpectomy on 3/18. cx normal flora     Other issues during admission:  Fever/SIRS on 3/12   Symptoms of aspiration with increased cough and mild-mod oropharyngeal dysphagia   Urinary retention, required reinsertion of foley and E. Faecalis UTI  Leukocytosis  DVT (S/P IVC filter)    Hypoxic respiratory failure overnight 3/21-3/22   Now with Near complete opacification of the left lung   Gallbladder wall thickening per imaging       Plan:   1. SIRS.   Due to UTI and aspiration pneumonia.  Overall doing better. Fever resolving and hemodynamically more stable.      2. Recurrent Hypoxic respiratory failure and aspiration pneumonia .  Episode on 3/21-22 due to aspiration/malositioned Corpak (per xray not in the stomach), required BiPAP and NRB. Sputum cx normal flora and Stenotrophomonas maltophilia.    Near complete opacification of the left lung on 3/27 xray , probably mucous plug after extubation.      S/P intubation and bronch on 3/27  . ' Copious thick white secretions were noted in the leftmainstem bronchus. Left airway mucosa was edematous and friable with some mucosal bleeding' . Sputum cx normal flora     S/P trach 3/28     Continue levaquin for now to finish 7 day course.   Monitor  respiratory status  /pulmonary toilet.     3. E. Faecalis UTI.   Cath related   Foley was removed/then replaced x 2 given retention   Has finished the course.      4. 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   S/P C5-T1 posterior fusion, C5-C7 anterior fusion with partial C7 corpectomy on 3/18. cx normal flora     5. Leukocytosis.   Due to above.   Resolved.    6. Gallbladder wall thickening  No symptoms suggestive of cholecystitis for now     7. Goals of care.   Palliative care on board     Discussed with RT       Signed by: [place]. [id number]  Infectious Disease  Phone: [phone]  Fax: [phone]

## 2015-09-11 ENCOUNTER — Inpatient Hospital Stay: Payer: Medicare Other

## 2015-09-11 LAB — CBC AND DIFFERENTIAL
Basophils Absolute Automated: 0 10*3/uL (ref 0.00–0.20)
Basophils Automated: 0 %
Eosinophils Absolute Automated: 0.08 10*3/uL (ref 0.00–0.70)
Eosinophils Automated: 1 %
Hematocrit: 33.9 % — ABNORMAL LOW (ref 42.0–52.0)
Hgb: 10.1 g/dL — ABNORMAL LOW (ref 13.0–17.0)
Immature Granulocytes Absolute: 0.05 10*3/uL
Immature Granulocytes: 1 %
Lymphocytes Absolute Automated: 0.79 10*3/uL (ref 0.50–4.40)
Lymphocytes Automated: 12 %
MCH: 31.3 pg (ref 28.0–32.0)
MCHC: 29.8 g/dL — ABNORMAL LOW (ref 32.0–36.0)
MCV: 105 fL — ABNORMAL HIGH (ref 80.0–100.0)
MPV: 11.6 fL (ref 9.4–12.3)
Monocytes Absolute Automated: 0.47 10*3/uL (ref 0.00–1.20)
Monocytes: 7 %
Neutrophils Absolute: 5.08 10*3/uL (ref 1.80–8.10)
Neutrophils: 78 %
Nucleated RBC: 0 /100 WBC (ref 0–1)
Platelets: 156 10*3/uL (ref 140–400)
RBC: 3.23 10*6/uL — ABNORMAL LOW (ref 4.70–6.00)
RDW: 15 % (ref 12–15)
WBC: 6.47 10*3/uL (ref 3.50–10.80)

## 2015-09-11 LAB — BASIC METABOLIC PANEL
BUN: 15 mg/dL (ref 9.0–28.0)
CO2: 31 mEq/L — ABNORMAL HIGH (ref 22–29)
Calcium: 7.8 mg/dL — ABNORMAL LOW (ref 7.9–10.2)
Chloride: 108 mEq/L (ref 100–111)
Creatinine: 0.4 mg/dL — ABNORMAL LOW (ref 0.7–1.3)
Glucose: 130 mg/dL — ABNORMAL HIGH (ref 70–100)
Potassium: 4.3 mEq/L (ref 3.5–5.1)
Sodium: 146 mEq/L — ABNORMAL HIGH (ref 136–145)

## 2015-09-11 LAB — GFR: EGFR: >60

## 2015-09-11 MED ORDER — FUROSEMIDE 10 MG/ML IJ SOLN
40.0000 mg | Freq: Once | INTRAMUSCULAR | Status: AC
Start: 2015-09-11 — End: 2015-09-11

## 2015-09-11 MED ORDER — SCOPOLAMINE 1 MG/3DAYS TD PT72
1.0000 | MEDICATED_PATCH | TRANSDERMAL | Status: DC
Start: 2015-09-11 — End: 2015-09-24
  Administered 2015-09-11 – 2015-09-23 (×5): 1 via TRANSDERMAL
  Filled 2015-09-11 (×5): qty 1

## 2015-09-11 MED ORDER — FUROSEMIDE 10 MG/ML IJ SOLN
INTRAMUSCULAR | Status: AC
Start: 2015-09-11 — End: 2015-09-11
  Administered 2015-09-11: 40 mg via INTRAVENOUS
  Filled 2015-09-11: qty 4

## 2015-09-11 MED ORDER — SODIUM CHLORIDE 0.9 % IV BOLUS
1000.0000 mL | Freq: Once | INTRAVENOUS | Status: AC
Start: 2015-09-11 — End: 2015-09-11
  Administered 2015-09-11: 1000 mL via INTRAVENOUS

## 2015-09-11 MED ORDER — ACETYLCYSTEINE 10 % IN SOLN
3.0000 mL | Freq: Four times a day (QID) | RESPIRATORY_TRACT | Status: AC
Start: 2015-09-11 — End: 2015-09-13
  Administered 2015-09-11: 4 mL via RESPIRATORY_TRACT
  Administered 2015-09-11 – 2015-09-12 (×5): 3 mL via RESPIRATORY_TRACT
  Filled 2015-09-11 (×8): qty 4

## 2015-09-11 NOTE — Progress Notes (Signed)
[facility] SOCIAL WORK  Date Time: 09/11/2015 3:01 PM  Patient Name: Ernest Diaz  Social Worker: Tawni Pummel          Situation/Subjective: Pt lying in bed, drowsy and not easily engaged. Pt was able to give a "fist bump" when SW left the room. Son Ernest Diaz is at bedside.    Background: 70 year old male with HTN, and Afib. 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 S/p 3 surgical neurosurgical interventions since 2/27. Noted aspiration with mild to moderate dysphagia. Now s/p trach and PEG, progressing with rehab goals.    Palliative care consulted for dyspnea, dysphagia and goals of care.    Assessment: SW met with Pt.'s son, Ernest Diaz at bedside. Pt.'s son reports Pt has been fairly sleepy all day, and has not been communicating much. Pt.'s son is not easily engaged in supportive counseling. He did mention that he feels Pt.'s pain has been well controlled in the last two days which is a comfort. SW asked about Pt.'s adjustment to illness, and the trach in particular. Pt.'s son feels Pt is coping well, he does not seem to get too anxious and he is pretty patient with trying to communicate.    Pt.'s son states he is now trying to figure out a care plan for his mother since it seems Pt may be eligible for acute rehab, and his mother would not be able to go to acute rehab with Pt. Pt.'s son states his mother cannot be home alone anymore, so figuring out a care plan for her has been challenging; particularly since he lives out of state in [place].     Pt.'s son is aware that even with acute rehab, Pt may need long term care going forward, he is somewhat unsure of how to plan for that particular outcome. SW offered support as appropriate, Pt.'s son did not have further questions or concerns. He would appreciate updates as we approach readiness for discharge, so he can plan accordingly.    Interventions/Plan:   Anticipate discharge to acute rehab per PT/OT  recommendations. Son is concerned about caregiving for his mother since Pt was her caregiver prior to admission.  Family would consider transitioning Pt and his wife down to [place] where son lives if Pt.'s recovery is slow.  Palliative care will continue to follow.    Care network:  Medical decision maker:  Ernest Diaz, [phone] and wife, Ernest Diaz, [phone]/ [phone] - Pt.'s wife has mild to moderate dementia, so decision making should be addressed with Pt.'s son.  Primary caregiver: Pt did not require caregiver prior to admission. Pt was independent in ADL's prior to admission. Pt was his wife's caregiver - in his absence she will now likely require assisted living/24 hour supervision.  Living situation: Pt was living at home with his wife in [place].   Advance Directive status: Not addressed today.  Current code status:  NO CPR - SUPPORT OK - Would like to obtain a Durable DNR prior to D/C.    Cultural/Spiritual factors:   Spiritual framework: Roman [employer]  Is this a resource: ?Yes. Family is open to Chaplain support.    Ernest Diaz, ACHP-SW  Palliative Care Therapist  [phone] Main  [phone] Spectra/VM  Extend Pager: Palliative Medicine [facility]

## 2015-09-11 NOTE — Plan of Care (Signed)
Problem: Safety  Goal: Patient will be free from injury during hospitalization  Outcome: Progressing  Patient transferred using draw sheet to prevent further injury to back and neck.  Patient had large amounts of thick white secretions, patient suctioned frequently in the mouth and trache to facilitate better air exchange.  Patient monitored frequently, patient desaturated and became brady was put on pressure support to help his lungs and maintain ventilation.    Patient can mouth words, and use hand gestures, method of communication using signals and simple words implemented to facilitate statement of needs.        Problem: Potential for Compromised Skin Integrity  Goal: Nutritional status is improving  Monitor and assess patient for malnutrition (ex- brittle hair, bruises, dry skin, pale skin and conjunctiva, muscle wasting, smooth red tongue, and disorientation). Collaborate with interdisciplinary team and initiate plan and interventions as ordered. Monitor patient's weight and dietary intake as ordered or per policy. Utilize nutrition screening tool and intervene per policy. Determine patient's food preferences and provide high-protein, high-caloric foods as appropriate.   Outcome: Progressing  Patient was assisted with turning and moisturizing mouth to maintain skin and mucous integrity.    Problem: Moderate/High Fall Risk Score >5  Goal: Patient will remain free of falls  Outcome: Progressing  Frequent rounding allowed patient to express needs.  Patient remained free of falls.     Problem: Inadequate Gas Exchange  Goal: Adequately oxygenating and ventilation is improved  Outcome: Progressing  Patient desaturated and became bradycardic RT was notified, RT PEEP bagged patient, saturation improved but patient desaturated and became brady very shortly after.  [facility] and RT notified, patient was put on Pressure support.  Saturation and HR improved patient rested well for the rest of the night.    Problem: Mechanical  Ventilation  Goal: Tracheostomy will be maintained  Outcome: Progressing  Trache suctioning and cleaning performed.  Inner cannula changed and trache site cleaned.

## 2015-09-11 NOTE — Progress Notes (Addendum)
Urine output was low (in the 20s and 30s) and trending down.  [facility] attending provider notified at 0155, NS one litre bolus ordered and given.     Patient output did not improve after NS bolus.  [facility] attending notified at 0626, Lasix 40 mg ordered and given at 6:35.  Output from 6:30 - 7:45 was 500 ml.

## 2015-09-11 NOTE — Progress Notes (Signed)
PROGRESS NOTE    Date Time: 09/11/2015 6:06 PM  Patient Name: Ernest Diaz      Assessment:   1. Respiratory failure status post tracheostomy.  2. Recurrent mucus plugging with recent pneumonia.  3. 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  4. Pleural effusion.    Plan:   Able to come off vent.  Episode of desaturation last night likely related to mucus plugging.  On vent with PS 10 this am. Will place back on Trach collar as tolerated and rest on vent.  Cont Duonebs. Agree with addition of mucomyst.  Continues to have moderate thick secretions on deep suctioning by me.  Repeat CXR did not reveal increasing or new infiltrates.    D/W pt's son.    Subjective:   Afebrile on vent.    Medications:     Current Facility-Administered Medications   Medication Dose Route Frequency   . acetylcysteine  3 mL Nebulization Q6H   . albuterol-ipratropium  3 mL Nebulization Q6H SCH   . balsam peru-castor oil (VENELEX)   Topical Q12H SCH   . enoxaparin  40 mg Subcutaneous Daily   . famotidine  20 mg Oral Q12H SCH    Or   . famotidine  20 mg Intravenous Q12H SCH   . fentaNYL  1 patch Transdermal Q72H   . gabapentin  200 mg per G tube QHS   . midodrine  10 mg Oral TID MEALS   . scopolamine  1 patch Transdermal Q72H   . terazosin  1 mg Oral QHS       Review of Systems:   A comprehensive review of systems was: Negative    Physical Exam:     Filed Vitals:    09/11/15 1731   BP:    Pulse:    Temp:    Resp:    SpO2: 99%   100/58  60  98.5  12    Intake and Output Summary (Last 24 hours) at Date Time    Intake/Output Summary (Last 24 hours) at 09/11/15 1806  Last data filed at 09/11/15 1700   Gross per 24 hour   Intake   1095 ml   Output   1710 ml   Net   -615 ml       General appearance - acyanotic, in no respiratory distress  Mental status - drowsy  Eyes - pupils equal and reactive, extraocular eye movements intact  Ears - not examined  Nose - normal and patent, no erythema, discharge or polyps  Mouth -  mucous membranes moist, pharynx normal without lesions  Neck - supple, no significant adenopathy and neck collar in place  Lymphatics - no palpable lymphadenopathy, no hepatosplenomegaly  Chest - rhonchi noted bilaterally  Heart - normal rate, regular rhythm, normal S1, S2, no murmurs, rubs, clicks or gallops  Abdomen - soft, nontender, nondistended, no masses or organomegaly  Extremities - peripheral pulses normal, no pedal edema, no clubbing or cyanosis    Labs:     Results     Procedure Component Value Units Date/Time    GFR [[id number]] Collected:  09/11/15 0506     EGFR >60.0 Updated:  09/11/15 0606    Basic Metabolic Panel [[id number]]  (Abnormal) Collected:  09/11/15 0506    Specimen Information:  Blood Updated:  09/11/15 0606     Glucose 130 (H) mg/dL      BUN 84.1 mg/dL      Creatinine 0.4 (L) mg/dL  Calcium 7.8 (L) mg/dL      Sodium 518 (H) mEq/L      Potassium 4.3 mEq/L      Chloride 108 mEq/L      CO2 31 (H) mEq/L     CBC and differential [[id number]]  (Abnormal) Collected:  09/11/15 0506    Specimen Information:  Blood from Blood Updated:  09/11/15 0556     WBC 6.47 x10 3/uL      Hgb 10.1 (L) g/dL      Hematocrit 16.0 (L) %      Platelets 156 x10 3/uL      RBC 3.23 (L) x10 6/uL      MCV 105.0 (H) fL      MCH 31.3 pg      MCHC 29.8 (L) g/dL      RDW 15 %      MPV 11.6 fL      Neutrophils 78 %      Lymphocytes Automated 12 %      Monocytes 7 %      Eosinophils Automated 1 %      Basophils Automated 0 %      Immature Granulocyte 1 %      Nucleated RBC 0 /100 WBC      Neutrophils Absolute 5.08 x10 3/uL      Abs Lymph Automated 0.79 x10 3/uL      Abs Mono Automated 0.47 x10 3/uL      Abs Eos Automated 0.08 x10 3/uL      Absolute Baso Automated 0.00 x10 3/uL      Absolute Immature Granulocyte 0.05 x10 3/uL           Recent CBC   Recent Labs      09/11/15   0506   RBC  3.23*   HGB  10.1*   HEMATOCRIT  33.9*   MCV  105.0*   MCH  31.3   MCHC  29.8*   RDW  15   MPV  11.6     Recent BMP   Recent Labs       09/11/15   0506   GLUCOSE  130*   BUN  15.0   CREATININE  0.4*   CALCIUM  7.8*   SODIUM  146*   POTASSIUM  4.3   CHLORIDE  108   CO2  31*       Rads:   Radiological Procedure reviewed.    Signed by: Sherrie Mustache, MD

## 2015-09-11 NOTE — Progress Notes (Signed)
[facility] Inpatient Rehab Note:  Following patient for potential inpatient rehab needs, PT/OT recommendations, and medical clearance.  Shey Yott, RN-BC, CRRN  [facility]-Rehab Admission Liaison  #[id number]

## 2015-09-11 NOTE — Progress Notes (Addendum)
Weekend case management progress note:    Worker attempted to visit w/pt and family@ bedside on 09/11/15. Pt was asleep and family was not present. Pt's [facility] recommendation has changed from SNF to Acute Rehab, per PT notes. Writer will place [facility] referrals on this date and discuss with family when they are next at bedside. Writer to otherwise follow.    Frederich Chick, LCSW  Case Management and Discharge Planning  [phone]    Discussion w/pt's son Fara Olden on this date. Explained change in [facility] recommendation from SNF to Acute Rehab. Son expressed that "this complicates things" as pertains to pt's spouse (initial plan was for pt and spouse to be placed together@ [facility]) and worker confirmed that spouse cannot [facility] w/pt. Son expressed preference for [facility] in [place], and [facility] referral updated. Writer to continue to follow.

## 2015-09-11 NOTE — Progress Notes (Signed)
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 Jaynie Collins at [facility].   - s/p C6-7 ACDF, C6-C7 posterior fusion with lateral mass scews on 08/20/15 by Dr. Jaynie Collins.  - s/p  C5-T1 posterior fusion, C5-C7 anterior fusion with partial C7 corpectomy on 08/29/15 by Dr. Claudette Laws:  - NSG FU  - Hard collar at all times 2/2 neck weakness  - on Fentanyl patch  - F/U Palliative team for pain management.  - PT/OT--> acute rehab, F/U CM for [facility] plan when he gets stable from pulmo stand point.      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 Will need long term trach for airway protection. Ventilatory support PRN.  - Aspiration pneumonia/atelectasis; fluid overload; poor secretions clearance, decrease FRC form prior spine surgery.  - Stenotrophomonas VAP, will complete course of Levaquin on 3/29  - Diuresis prn,  - CXR on 3/31--> Persistent bilateral pleural effusions and basilar atelectasis  - On bronchodilators, Mucomyst and trach tube care  - more secretion on trach suctioning--> on scopolamine patch.   - F/U pulmo attending. Now he has been on PS mode since AM today    Aspiration pneumonia  - sputum--> Stenotrophomonas.   - completed 7 day course of Levaquin dose on 3/31 (no bactrim given sulfa allergy).   - New BAL sent 3/27--> Mixed upper resp flora   - No fever/leukocytosis  - Recent CXR on 3/28---> bibasilar opacities/atelectasis     E. Faecalis UTI.   Cath related   Foley was removed/then replaced x 2 given retention   Has finished the course      S/p PEG tube:   - Tolerating TF.   - Avoid  constipation, Good BM. No rectal tone    Hypotension - chronic, in the setting of dysautonomia  - Continue Midodrine. Off Florinef  - Continue PRN diuresis as tolerated by BP  - stable now      AF: Rate controlled. Off AC.   - Re-address anticoagulation with NSG on POD # 14 (09/12/2015)    Hypokalemia  - on oral KCL PRN, resolved  - closely monitor serum electrolytes    GI/DVT prophylaxis: on Pepcid; LMWH    Case discussed with:     Safety Checklist:     DVT prophylaxis:  CHEST guideline (See page [id number]) Lovenox   Foley:  [place] Rn Foley protocol yes   IVs:  piv   PT/OT: yes   Daily CBC & or Chem ordered:  SHM/ABIM guidelines (see #5) yes   Reference for approximate charges of common labs: CBC auto diff - $76  BMP - $99  Mg - $79    Lines:     Patient Lines/Drains/Airways Status    Active PICC Line / CVC Line / PIV Line / Drain / Airway / Intraosseous Line / Epidural Line / ART Line / Line / Wound / Pressure Ulcer / NG/OG Tube     Name:   Placement date:   Placement time:   Site:   Days:    Peripheral IV 08/29/15 Left Hand  08/29/15   1345   Hand   11    Peripheral IV 08/30/15 Right Antecubital  08/30/15   0030   Antecubital   10    Peripheral IV 09/08/15 Left Forearm  09/08/15   0400   Forearm   1    Peripheral IV 09/08/15 Right Hand  09/08/15   0400   Hand   1    Peripheral IV 09/08/15 Left Forearm  09/08/15   2200   Forearm   less than 1    Gastrostomy/Enterostomy Gastrostomy-jejunostomy  09/04/15         5    Urethral Catheter Temperature probe 16 Fr.  09/07/15   1300   Temperature probe   2    External Urinary Catheter  09/06/15   1230      3    Surgical Airway Shiley 6 mm  09/08/15   1110   6 mm   1    Wound 08/25/15 Pressure Injury Buttocks Left;Right Non-Blanchable Erythema  08/25/15   2258   Buttocks   14    Incision Site 08/20/15 Neck  08/20/15   1427     19    Incision Site 08/20/15 Neck  08/20/15   1932     19    Incision Site 08/28/15 Neck  08/28/15   2352     11    Incision Site 08/29/15 Neck  08/29/15    0515     11                 Disposition:     Today's date: 09/11/2015  Length of Stay: 25  Anticipated medical stability for discharge: 3- 4 days  Reason for ongoing hospitalization: quadriparesis  Anticipated discharge needs: TBD    Subjective     CC: Quadriplegia    Interval History/24 hour events: please see below    HPI/Subjective: Ernest Diaz is a 70 y.o. male hx afib on Eliquis + aspirin, HTN who [facility] after a fall. CT head and spine were negative so he was discharged. 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 MRI cervical and thoracic spine showed spinal epidural hematoma extending from the foramen magnum to the upper thoracic spine. Neurosurgery (Dr. Jaynie Collins) was consulted. His Eliquis was reversed. He ultimately had C3-C5 and T3-T4 decompressive laminectomies 2/27 by Dr. Jaynie Collins. He noted extensive dorsal epidural venous plexus and when he dissected out these vessels they bled. Dr. Jaynie Collins requested he be transferred here for spinal angiogram. These symptoms are sudden onset, severe intensity, without alleviating factors.     3/29. More stable at [facility] ICU neurologically, stable at trach collar, no fever/diarrhea. Tolerating tube feeding. Follows commands. Not in any distress.   3/30. More alert and awake, follows commands. No any acute events noted. Family at bed side.  3/31. Had desaturation intermittently since last night, had significant secretion in his trach suctioning. No fever. More alert and awake. Not in distress now, more comfort with PS. Has mild discomfort at trach site. No discharge from the trach site.    Physical Exam:     VITAL SIGNS PHYSICAL EXAM   Temp:  [97.7 F (36.5 C)-98.2 F (36.8 C)] 98.2 F (36.8 C)  Heart Rate:  [52-69] 58  Resp Rate:  [11-15] 15  BP: (91-158)/(51-66) 92/56 mmHg  FiO2:  [40 %-100 %] 40 %  Blood Glucose:    Telemetry:       Intake/Output Summary (  Last 24 hours) at 09/11/15 1219  Last data filed at 09/11/15  0900   Gross per 24 hour   Intake   1075 ml   Output   1515 ml   Net   -440 ml    Physical Exam  General: awake, alert X 3, not in distress  HEENT- NC, AT, reactive pupils, no pallor/icterus.  Neck- on Aspen collar, trach tube in place- site: dry and clean   Cardiovascular: regular rate and rhythm, no murmurs, rubs or gallops  Lungs: clear to auscultation bilaterally, without wheezing, rhonchi, or rales  Abdomen: soft, non-tender, non-distended; no palpable masses,  normoactive bowel sounds. PEG tube site- clean/dry/no leakage  Extremities: mild edema+ in both ankles, no cyanosis/clubbng  CNS- cranial nerves- grossly intact, 3+/5 in both upper extremities, 0/5 in both lower extremities  Skin- warm, no rashes         Meds:     Medications were reviewed:    Labs:     Labs (last 72 hours):      Recent Labs  Lab 09/11/15  0506 09/10/15  0418   WBC 6.47 7.45   HGB 10.1* 10.0*   HEMATOCRIT 33.9* 33.5*   PLATELETS 156 176            Recent Labs  Lab 09/11/15  0506 09/10/15  0418   SODIUM 146* 146*   POTASSIUM 4.3 3.7   CHLORIDE 108 105   CO2 31* 37*   BUN 15.0 13.0   CREATININE 0.4* 0.5*   CALCIUM 7.8* 8.1   GLUCOSE 130* 120*                   Microbiology, reviewed  Imaging, reviewed    Signed by: Lucillie Garfinkel, MD

## 2015-09-11 NOTE — Progress Notes (Signed)
ID PROGRESS NOTE    Date Time: 09/11/2015 8:57 AM  Patient Name: [facility]        Subjective, ROS:    SOB overnight and hypoxic  Deep suctioned and placed back on vent     Afebrile and hemodynamically stable   Stable labs     Son and nurse at bedside    Pain around trach  Still requires suction  Some SOB  Tolerating TF     Antibiotics and culture results:   Antibiotics: levaquin #7  Midodrine   S/P vanc , zosyn       Cultures:  3/27 BAL cx upper flora   3/22 sputum cx Stenotrophomonas maltophilia , mixed upper flora    3/20 flu negative   3/18 cervical wound cx cut flora  3/18 cervical wound anaerobic cx ngtd  3/18 cervical wound AFB and fungal cx Ngtd  3/13 Ucx >100,000 E. Faecalis   3/13 Blood cx Ngtd  3/6 MRSA negative     3/9 path FIBROVASCULAR TISSUE, FEW VESSELS AND BONE CHIPS       Lines: PIV access    Physical Exam:     Filed Vitals:    09/11/15 0808   BP:    Pulse:    Temp:    Resp:    SpO2: 98%       General: in bed, in ICU, awake and  communicative. vented , son and nurse at bedside   HEENT: slightly dry mucosa ,  pale, not icteric , cervical collar in place , trach in place , area above trach with significant swelling   Chest: S1S2 irregular , few crackles bilaterally,  no wheeze , BS at bases decreased.   Abdomen: soft, not tender , BS +, PEG in place   Ext: + edema   Weakness  No change    Foley in place       Labs:       Recent CBC WITH DIFF   Recent Labs      09/11/15   0506   WBC  6.47   RBC  3.23*   HGB  10.1*   HEMATOCRIT  33.9*   MCV  105.0*       Recent CMP   Recent Labs      09/11/15   0506   GLUCOSE  130*   BUN  15.0   CREATININE  0.4*   SODIUM  146*   POTASSIUM  4.3   CHLORIDE  108   CO2  31*           Rads:   3/31 xray Persistent bilateral pleural effusions and basilar atelectasis..    3/27 xray Near complete opacification of the left lung which may be secondary to mucous plugging. Urgent findings discussed with, and read back by, nurse Misty Stanley, RN at [phone] on 09/07/2015.  Stable right-sided pleural effusion and atelectasis    3/23 sono Similar findings as seen on recent CT. Thick-walled bladder without significant distention with pericholecystic fluid. Lack of gallstones and clear focal tenderness makes cholecystitis less likely. The patient was uncooperative for this portable examination however. Differential considerations would include hypoalbuminemia, chronic cholecystitis or acute cholecystitis. If continued clinical concern can perform HIDA scan. Bilateral effusions. Patient refused renal evaluation.    3/22 CT  Decompressed stomach located within the anterior abdomen extending  below the left hepatic lobe margin and above the transverse colon.. Nonspecific gallbladder wall thickening with pericholecystic fluid and stranding in the presence of small  ascites. Recommend clinical correlation for acute cholecystitis.. Bilateral small effusions and basilar lower lobe consolidative atelectasis. Cardiac enlargement.    3/20 IRSuccessful deployment of a retrievable inferior vena cava filter    3/19 doppler Nonocclusive deep venous thrombosis involving a short segment of the proximal femoral vein.    3/17 CT  Markedly abnormal examination with anterolisthesis of C6 upon C7  measuring 16 mm with a jumped facet on the left and a comminuted displaced fracture involving the right C7 articular facet. Anterior cervical spinal fusion hardware is malpositioned with both plate and screws extending into the C6-C7 disc space.  The AP dimension of the bony spinal canal at the superior C5 level is reduced to 5 mm.     3/17 xray No fracture acute osseous abnormality identified in the right shoulder, humerus or forearm    3/8 MRI Postsurgical change with prior laminectomy. 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 There findings suspicious for a disc ligamentous injury at this level. Correlation with CT is advised    3/7 IR No angiographic evidence of  intracranial aneurysm, arteriovenous malformation or dural fistula. Is no angiographic evidence of spinal arteriovenous malformation or dural fistula.  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   - worsening of weakness due to Fracture dislocation C6-C7 with malpositioned hardware  - S/P C5-T1 posterior fusion, C5-C7 anterior fusion with partial C7 corpectomy on 3/18. cx normal flora     Other issues during admission:  Fever/SIRS on 3/12   Symptoms of aspiration with increased cough and mild-mod oropharyngeal dysphagia   Urinary retention, required reinsertion of foley and E. Faecalis UTI  Leukocytosis  DVT (S/P IVC filter)    Hypoxic respiratory failure overnight 3/21-3/22   Now with Near complete opacification of the left lung   Gallbladder wall thickening per imaging       Plan:   1. SIRS.   Due to UTI and aspiration pneumonia.  Overall doing better and hemodynamically more stable.      2. Recurrent Hypoxic respiratory failure and aspiration pneumonia .  Episode on 3/21-22 due to aspiration/malositioned Corpak (per xray not in the stomach), required BiPAP and NRB. (Sputum cx normal flora and Stenotrophomonas maltophilia)     Near complete opacification of the left lung on 3/27 xray , probably mucous plug after extubation.      S/P intubation and bronch on 3/27  . ' Copious thick white secretions were noted in the leftmainstem bronchus. Left airway mucosa was edematous and friable with some mucosal bleeding' . Sputum cx normal flora      S/P trach 3/28     On levaquin. Plan is to finish a 7 day course today.   Monitor respiratory status closely/was hypoxic last night.   May need to extend the course.   Chest PT and pulmonary toilet.   Pulmonary on board.     3. E. Faecalis UTI.   Cath related   Foley was removed/then replaced x 2 given  retention   Has finished the course.      4. 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   S/P C5-T1 posterior fusion, C5-C7 anterior fusion with partial C7 corpectomy on 3/18. cx normal flora     5. Leukocytosis.   Due to above.   Resolved.    6. Gallbladder wall thickening  No symptoms suggestive of cholecystitis for now     7. Goals of care.   Palliative care on board     Discussed with nursing  Discussed with the patient and his son.   Discussed with Dr. Lemar Livings.          Signed by: [place]. [id number]  Infectious Disease  Phone: [phone]  Fax: [phone]

## 2015-09-11 NOTE — Significant Event (Addendum)
[facility] Critical Event :  Attending Physician Note    Date Time: 09/11/2015 12:31 AM  Admit Date: 08/17/2015 12:31 AM  Patient Name: Ernest Diaz  Attending Physician: Balinda Quails Man, MD  Primary Care Physician: Berton Lan, MD  Location/Room: Z610/R604.54   Hospital Day #: 25     Primary Reason for evaluation :    Hypoxia on trach mask    Called to bedside by patient's nurse, patient currently on CNS hospitalist service, chronic hypoxic/hypercarbic respiratory failure s/p trach 3/28. Weaned to trach mask. Patient mouthing compliant of feeling short of breath.        Assessment and Plan   Assessment:   Conditions with high probability of imminent deterioration or death include:       Hypoxic respiratory failure    Exam:  Vitals HR 60, atrial fibrillation, RR 18, sat 96% on trach mask  General: awake, interactive, mouthing words  Chest: scattered rhonchi bilaterally, symmetric air movement  Abdomen: PEG site c/d/i. Soft, not tender.     Patient bag ventilated, with PEEP valve, deep suctioned via trach with return of saturation to upper 90's.       Plan:   Life saving interventions include:  Mechanical ventilator support    -STAT CXR ordered, suspect mucus plugging and atelectasis  -Mucomyst, IPV, duonebs q6 hours  -Pressure support ventilation, titrate for TV >300, RR <25, sat >92%  -Pulmonology Dr. Lemar Livings following for vent weaning    OK for patient to remain [facility] status. Attempted to reach Dr. Durene Cal, CNS Hospitalist. to convey recommendations.     This plan was discussed with the  clinical staff , patient    I have personally examined the patient and reviewed the data.    I have spent  an additional  35 minutes providing critical care for this patient excluding teaching and billable procedures, and not overlapping with any other providers.      Signed by: Cristal Generous M.D.  Date/Time:  09/11/2015 12:31 AM

## 2015-09-11 NOTE — Progress Notes (Addendum)
At 1215 the patient was desaturating to 76% and bradycardiac to 40s, inline trache suctioning did not relieve symptoms. RT, [facility] attending notified.  PEEP valve bagged by RT, patient relieved only briefly. [facility] attending notified after hospitalist.  1230 patient was vented to support breathing. X-ray at 1247.  Patient now resting comfortably on pressure support, sating over 95%.

## 2015-09-12 LAB — CBC AND DIFFERENTIAL
Basophils Absolute Automated: 0.01 10*3/uL (ref 0.00–0.20)
Basophils Automated: 0 %
Eosinophils Absolute Automated: 0.02 10*3/uL (ref 0.00–0.70)
Eosinophils Automated: 0 %
Hematocrit: 31.7 % — ABNORMAL LOW (ref 42.0–52.0)
Hgb: 9.8 g/dL — ABNORMAL LOW (ref 13.0–17.0)
Immature Granulocytes Absolute: 0.05 10*3/uL
Immature Granulocytes: 1 %
Lymphocytes Absolute Automated: 0.78 10*3/uL (ref 0.50–4.40)
Lymphocytes Automated: 11 %
MCH: 31.2 pg (ref 28.0–32.0)
MCHC: 30.9 g/dL — ABNORMAL LOW (ref 32.0–36.0)
MCV: 101 fL — ABNORMAL HIGH (ref 80.0–100.0)
MPV: 11.3 fL (ref 9.4–12.3)
Monocytes Absolute Automated: 0.63 10*3/uL (ref 0.00–1.20)
Monocytes: 9 %
Neutrophils Absolute: 5.41 10*3/uL (ref 1.80–8.10)
Neutrophils: 78 %
Nucleated RBC: 0 /100 WBC (ref 0–1)
Platelets: 183 10*3/uL (ref 140–400)
RBC: 3.14 10*6/uL — ABNORMAL LOW (ref 4.70–6.00)
RDW: 15 % (ref 12–15)
WBC: 6.9 10*3/uL (ref 3.50–10.80)

## 2015-09-12 LAB — GFR: EGFR: >60

## 2015-09-12 LAB — BASIC METABOLIC PANEL
BUN: 14 mg/dL (ref 9.0–28.0)
CO2: 35 mEq/L — ABNORMAL HIGH (ref 22–29)
Calcium: 8 mg/dL (ref 7.9–10.2)
Chloride: 106 mEq/L (ref 100–111)
Creatinine: 0.5 mg/dL — ABNORMAL LOW (ref 0.7–1.3)
Glucose: 117 mg/dL — ABNORMAL HIGH (ref 70–100)
Potassium: 4.4 mEq/L (ref 3.5–5.1)
Sodium: 146 mEq/L — ABNORMAL HIGH (ref 136–145)

## 2015-09-12 NOTE — Progress Notes (Signed)
PROGRESS NOTE    Date Time: 09/12/2015 5:44 PM  Patient Name: Ernest Diaz      Assessment:   1. Respiratory failure status post tracheostomy.  2. Recurrent mucus plugging with recent pneumonia.  3. 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  4. Pleural effusion.    Plan:   Able to come off vent again today on T piece.  Suctioned by me for a moderate amount of thick secretions (required deep inline suctioning).  Rest on vent tonight.  Speech to eval for PMSV in am.  Cont Duonebs and mucomyst.  Repeat CXR did not reveal increasing or new infiltrates.    D/W pt's family members at bedside.    Subjective:   Afebrile on vent. Alert, interacting with family members.    Medications:     Current Facility-Administered Medications   Medication Dose Route Frequency   . acetylcysteine  3 mL Nebulization Q6H   . albuterol-ipratropium  3 mL Nebulization Q6H SCH   . balsam peru-castor oil (VENELEX)   Topical Q12H SCH   . enoxaparin  40 mg Subcutaneous Daily   . famotidine  20 mg Oral Q12H SCH    Or   . famotidine  20 mg Intravenous Q12H SCH   . fentaNYL  1 patch Transdermal Q72H   . gabapentin  200 mg per G tube QHS   . midodrine  10 mg Oral TID MEALS   . scopolamine  1 patch Transdermal Q72H   . terazosin  1 mg Oral QHS       Review of Systems:   A comprehensive review of systems was: Negative    Physical Exam:     Filed Vitals:    09/12/15 1700   BP: 108/56   Pulse: 68   Temp: 98.6 F (37 C)   Resp: 17   SpO2: 96%       Intake and Output Summary (Last 24 hours) at Date Time    Intake/Output Summary (Last 24 hours) at 09/12/15 1744  Last data filed at 09/12/15 1700   Gross per 24 hour   Intake   1240 ml   Output    960 ml   Net    280 ml       General appearance - acyanotic, in no respiratory distress  Mental status - drowsy  Eyes - pupils equal and reactive, extraocular eye movements intact  Ears - not examined  Nose - normal and patent, no erythema, discharge or polyps  Mouth - mucous membranes  moist, pharynx normal without lesions  Neck - supple, no significant adenopathy and neck collar in place  Lymphatics - no palpable lymphadenopathy, no hepatosplenomegaly  Chest - rhonchi noted bilaterally  Heart - normal rate, regular rhythm, normal S1, S2, no murmurs, rubs, clicks or gallops  Abdomen - soft, nontender, nondistended, no masses or organomegaly  Extremities - peripheral pulses normal, no pedal edema, no clubbing or cyanosis    Labs:     Results     Procedure Component Value Units Date/Time    CBC and differential [[id number]]  (Abnormal) Collected:  09/12/15 0528    Specimen Information:  Blood from Blood Updated:  09/12/15 0632     WBC 6.90 x10 3/uL      Hgb 9.8 (L) g/dL      Hematocrit 06.3 (L) %      Platelets 183 x10 3/uL      RBC 3.14 (L) x10 6/uL      MCV  101.0 (H) fL      MCH 31.2 pg      MCHC 30.9 (L) g/dL      RDW 15 %      MPV 11.3 fL      Neutrophils 78 %      Lymphocytes Automated 11 %      Monocytes 9 %      Eosinophils Automated 0 %      Basophils Automated 0 %      Immature Granulocyte 1 %      Nucleated RBC 0 /100 WBC      Neutrophils Absolute 5.41 x10 3/uL      Abs Lymph Automated 0.78 x10 3/uL      Abs Mono Automated 0.63 x10 3/uL      Abs Eos Automated 0.02 x10 3/uL      Absolute Baso Automated 0.01 x10 3/uL      Absolute Immature Granulocyte 0.05 x10 3/uL     Basic Metabolic Panel [[id number]]  (Abnormal) Collected:  09/12/15 0528    Specimen Information:  Blood Updated:  09/12/15 0621     Glucose 117 (H) mg/dL      BUN 40.9 mg/dL      Creatinine 0.5 (L) mg/dL      Calcium 8.0 mg/dL      Sodium 811 (H) mEq/L      Potassium 4.4 mEq/L      Chloride 106 mEq/L      CO2 35 (H) mEq/L     GFR [[id number]] Collected:  09/12/15 0528     EGFR >60.0 Updated:  09/12/15 0621          Recent CBC   Recent Labs      09/12/15   0528   RBC  3.14*   HGB  9.8*   HEMATOCRIT  31.7*   MCV  101.0*   MCH  31.2   MCHC  30.9*   RDW  15   MPV  11.3     Recent BMP   Recent Labs      09/12/15   0528   GLUCOSE   117*   BUN  14.0   CREATININE  0.5*   CALCIUM  8.0   SODIUM  146*   POTASSIUM  4.4   CHLORIDE  106   CO2  35*       Rads:   Radiological Procedure reviewed.    Signed by: Sherrie Mustache, MD

## 2015-09-12 NOTE — Plan of Care (Signed)
Problem: Pain  Goal: Patient's pain/discomfort is manageable  Outcome: Progressing  Pt understands pain scale and communicates pain appropriately. PRN pain medication given 3x over shift.    Problem: Neurological Deficit  Goal: Neurological status is stable or improving  Outcome: Progressing  Pt neuro exam is stable.

## 2015-09-12 NOTE — Progress Notes (Signed)
S: no events noted by nurse overnight; minimal tracheal secretions per nurse    Abx:  S/p 8 days of levaquin    CENTRAL LINES:  NONE  PIV--no erythema noted    O: 98  HR 60s  RR 18  BP 122/58  100% 40% FIO2 PEEP 5  I/O -230  NAD, alert  Trach in place  Rhonchi bil  rrr no m/g  Soft, nt PEG tube in place  No edema    Labs:  Wbc 6.90 Hb 9.8 plts 183  Cr 0.5    Micro:  3/27 BAL cx upper flora   3/22 sputum cx Stenotrophomonas maltophilia , mixed upper flora   3/20 flu negative   3/18 cervical wound cx cut flora  3/18 cervical wound anaerobic cx ngtd  3/18 cervical wound AFB and fungal cx Ngtd  3/13 Ucx >100,000 E. Faecalis   3/13 Blood cx Ngtd  3/6 MRSA negative     3/9 path FIBROVASCULAR TISSUE, FEW VESSELS AND BONE CHIPS     Recs: 70 y/o male suffering paraparesis after fall due to epidural hematoma. Pt s/p C3-C5 decompressive laminectomies.  Pt had worsening weakness due to malposition of hardware and taken back to OR for redo.  Hospital course complicated by PNA, likely aspiration.  1. Aspiration PNA: s/p bronchoscopy with thick white secretions noted in left mainstem bronchus.  Pt s/p trach 09/08/15. Pt has completed a course of abx.  Monitor closely for new infection/respiratory decline.

## 2015-09-12 NOTE — Progress Notes (Signed)
Admitted patient from NSICU 71yo male. trached to mask at 35%, awake, conscious and communicates through mouth words. Able to do gross motor movements on the UE, very limited to no movements noted on LE.  Hooked to monitors, initial parameters taken and recorded. Complained of generalized pain/discomfort, PRN Morphine 10mg  PO given via GJ tube. Kept safe and comfortable, bed on lowest, alarms on and fall mats in place.  Will continue to monitor patient.

## 2015-09-12 NOTE — Progress Notes (Signed)
S:   CC: Denies complaints    O:  Alert, no apparent distress  Pleasant  Rigid cervical collar in place  Motor: RUE 4/5, LUE 4-/5 except handgrips R 3/5, L 1/5  No movement of BLE  +sensation BLE    A/P  S/p C3-C5 and T3-T4 decompressive laminectomies for EDH evacuation on 08/10/15 by Dr. Jaynie Collins at [facility]  S/p C6-7 ACDF, C6-C7 posterior fusion with lateral mass scews on 08/20/15 by Dr. Jaynie Collins at [facility]  S/p C5-T1 posterior fusion, C5-C7 anterior fusion with partial C7 corpectomy on 08/29/15 by Dr. Claudette Laws      POD 14  Neuro exam stable  Able to mouth words  Trach to T-piece  Mood improved  RN preparing patient for transfer to [facility]  Agree with discharge to acute rehab when medically stable    Son at bedside    Discussed patient with Dr. Jaynie Collins

## 2015-09-12 NOTE — Progress Notes (Signed)
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 Jaynie Collins at [facility].   - s/p C6-7 ACDF, C6-C7 posterior fusion with lateral mass scews on 08/20/15 by Dr. Jaynie Collins.  - s/p  C5-T1 posterior fusion, C5-C7 anterior fusion with partial C7 corpectomy on 08/29/15 by Dr. Claudette Laws:  - NSG FU  - Hard collar at all times 2/2 neck weakness  - on Fentanyl patch  - F/U Palliative team for pain management.  - PT/OT--> acute rehab, F/U CM for [place] plan when he gets stable from pulmo stand point.      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 Will need long term trach for airway protection. Ventilatory support PRN.  - Aspiration pneumonia/atelectasis; fluid overload; poor secretions clearance, decrease FRC form prior spine surgery.  - Stenotrophomonas VAP, will complete course of Levaquin on 3/29  - Diuresis prn,  - CXR on 3/31--> Persistent bilateral pleural effusions and basilar atelectasis  - On bronchodilators, Mucomyst and trach tube care  - more secretion on trach suctioning--> on scopolamine patch--better now.   - F/U pulmo attending. Now he has been on PS  - frequent suctioning.    Aspiration pneumonia  - sputum--> Stenotrophomonas.   - completed 7 day course of Levaquin dose on 3/31 (no bactrim given sulfa allergy).   - New BAL sent 3/27--> Mixed upper resp flora   - No fever/leukocytosis  - Recent CXR on 3/28---> bibasilar opacities/atelectasis     E. Faecalis UTI.   Cath related   Foley was removed/then replaced x 2 given retention   Has finished the course      S/p PEG tube:   - Tolerating  TF.   - Avoid constipation, Good BM. No rectal tone    Hypotension - chronic, in the setting of dysautonomia  - Continue Midodrine. Off Florinef  - Continue PRN diuresis as tolerated by BP  - stable now      AF: Rate controlled. Off AC.   - Re-address anticoagulation with NSG on POD # 14 (09/12/2015)  - will d/w NS team for [facility]- was on Eliquis PTA    Hypokalemia  - on oral KCL PRN, resolved  - closely monitor serum electrolytes    GI/DVT prophylaxis: on Pepcid; LMWH    Case discussed with: RN, family    Safety Checklist:     DVT prophylaxis:  CHEST guideline (See page [phone]) Lovenox   Foley:  [place] Rn Foley protocol yes   IVs:  piv   PT/OT: yes   Daily CBC & or Chem ordered:  SHM/ABIM guidelines (see #5) yes   Reference for approximate charges of common labs: CBC auto diff - $76  BMP - $99  Mg - $79    Lines:     Patient Lines/Drains/Airways Status    Active PICC Line / CVC Line / PIV Line / Drain / Airway / Intraosseous Line / Epidural Line / ART Line / Line / Wound / Pressure Ulcer / NG/OG Tube     Name:   Placement date:   Placement time:  Site:   Days:    Peripheral IV 08/29/15 Left Hand  08/29/15   1345   Hand   11    Peripheral IV 08/30/15 Right Antecubital  08/30/15   0030   Antecubital   10    Peripheral IV 09/08/15 Left Forearm  09/08/15   0400   Forearm   1    Peripheral IV 09/08/15 Right Hand  09/08/15   0400   Hand   1    Peripheral IV 09/08/15 Left Forearm  09/08/15   2200   Forearm   less than 1    Gastrostomy/Enterostomy Gastrostomy-jejunostomy  09/04/15         5    Urethral Catheter Temperature probe 16 Fr.  09/07/15   1300   Temperature probe   2    External Urinary Catheter  09/06/15   1230      3    Surgical Airway Shiley 6 mm  09/08/15   1110   6 mm   1    Wound 08/25/15 Pressure Injury Buttocks Left;Right Non-Blanchable Erythema  08/25/15   2258   Buttocks   14    Incision Site 08/20/15 Neck  08/20/15   1427     19    Incision Site 08/20/15 Neck  08/20/15   1932     19    Incision Site  08/28/15 Neck  08/28/15   2352     11    Incision Site 08/29/15 Neck  08/29/15   0515     11                 Disposition:     Today's date: 09/12/2015  Length of Stay: 26  Anticipated medical stability for discharge: 3- 4 days  Reason for ongoing hospitalization: quadriparesis  Anticipated discharge needs: TBD    Subjective     CC: Quadriplegia    Interval History/24 hour events: please see below    HPI/Subjective: Ernest Diaz is a 70 y.o. male hx afib on Eliquis + aspirin, HTN who presented to [facility] 2/25 after a fall. CT head and spine were negative so he was discharged. 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 MRI cervical and thoracic spine showed spinal epidural hematoma extending from the foramen magnum to the upper thoracic spine. Neurosurgery (Dr. Jaynie Collins) was consulted. His Eliquis was reversed. He ultimately had C3-C5 and T3-T4 decompressive laminectomies 2/27 by Dr. Jaynie Collins. He noted extensive dorsal epidural venous plexus and when he dissected out these vessels they bled. Dr. Jaynie Collins requested he be transferred here for spinal angiogram. These symptoms are sudden onset, severe intensity, without alleviating factors.     3/29. More stable at NS ICU neurologically, stable at trach collar, no fever/diarrhea. Tolerating tube feeding. Follows commands. Not in any distress.   3/30. More alert and awake, follows commands. No any acute events noted. Family at bed side.  3/31. Had desaturation intermittently since last night, had significant secretion in his trach suctioning. No fever. More alert and awake. Not in distress now, more comfort with PS. Has mild discomfort at trach site. No discharge from the trach site.  4/1. More alert, no any acute events noted. On PS mode. No fever, diarrhea and respiratory distress.     Physical Exam:     VITAL SIGNS PHYSICAL EXAM   Temp:  [98.2 F (36.8 C)-98.7 F (37.1 C)] 98.7 F (37.1 C)  Heart Rate:  [52-123] 70  Resp  Rate:  [12-22]  17  BP: (92-158)/(50-85) 116/55 mmHg  FiO2:  [40 %-100 %] 40 %  Blood Glucose:    Telemetry:       Intake/Output Summary (Last 24 hours) at 09/12/15 0536  Last data filed at 09/12/15 0400   Gross per 24 hour   Intake   1150 ml   Output   1830 ml   Net   -680 ml    Physical Exam  General: awake, alert X 3, not in distress  HEENT- NC, AT, reactive pupils, no pallor/icterus.  Neck- on Aspen collar, trach tube in place- site: dry and clean   Cardiovascular: regular rate and rhythm, no murmurs, rubs or gallops  Lungs: clear to auscultation bilaterally, without wheezing, rhonchi, or rales  Abdomen: soft, non-tender, non-distended; no palpable masses,  normoactive bowel sounds. PEG tube site- clean/dry/no leakage  Extremities: mild edema+ in both ankles, no cyanosis/clubbng  CNS- cranial nerves- grossly intact, 3+/5 in both upper extremities, 0/5 in both lower extremities  Skin- warm, no rashes         Meds:     Medications were reviewed:    Labs:     Labs (last 72 hours):      Recent Labs  Lab 09/11/15  0506 09/10/15  0418   WBC 6.47 7.45   HGB 10.1* 10.0*   HEMATOCRIT 33.9* 33.5*   PLATELETS 156 176            Recent Labs  Lab 09/11/15  0506 09/10/15  0418   SODIUM 146* 146*   POTASSIUM 4.3 3.7   CHLORIDE 108 105   CO2 31* 37*   BUN 15.0 13.0   CREATININE 0.4* 0.5*   CALCIUM 7.8* 8.1   GLUCOSE 130* 120*                   Microbiology, reviewed  Imaging, reviewed    Signed by: Lucillie Garfinkel, MD

## 2015-09-13 ENCOUNTER — Inpatient Hospital Stay: Payer: Medicare Other

## 2015-09-13 LAB — URINALYSIS WITH MICROSCOPIC
Bilirubin, UA: NEGATIVE
Blood, UA: NEGATIVE
Glucose, UA: NEGATIVE
Ketones UA: NEGATIVE
Nitrite, UA: NEGATIVE
Protein, UR: 30 — AB
Specific Gravity UA: 1.034 (ref 1.001–1.035)
Urine pH: 6 (ref 5.0–8.0)
Urobilinogen, UA: 4 mg/dL (ref 0.2–2.0)

## 2015-09-13 LAB — CBC AND DIFFERENTIAL
Basophils Absolute Automated: 0.01 10*3/uL (ref 0.00–0.20)
Basophils Automated: 0 %
Eosinophils Absolute Automated: 0.03 10*3/uL (ref 0.00–0.70)
Eosinophils Automated: 0 %
Hematocrit: 31.2 % — ABNORMAL LOW (ref 42.0–52.0)
Hgb: 9.4 g/dL — ABNORMAL LOW (ref 13.0–17.0)
Immature Granulocytes Absolute: 0.04 10*3/uL
Immature Granulocytes: 1 %
Lymphocytes Absolute Automated: 0.89 10*3/uL (ref 0.50–4.40)
Lymphocytes Automated: 11 %
MCH: 30.9 pg (ref 28.0–32.0)
MCHC: 30.1 g/dL — ABNORMAL LOW (ref 32.0–36.0)
MCV: 102.6 fL — ABNORMAL HIGH (ref 80.0–100.0)
MPV: 10.9 fL (ref 9.4–12.3)
Monocytes Absolute Automated: 0.74 10*3/uL (ref 0.00–1.20)
Monocytes: 9 %
Neutrophils Absolute: 6.33 10*3/uL (ref 1.80–8.10)
Neutrophils: 79 %
Nucleated RBC: 0 /100 WBC (ref 0–1)
Platelets: 178 10*3/uL (ref 140–400)
RBC: 3.04 10*6/uL — ABNORMAL LOW (ref 4.70–6.00)
RDW: 16 % — ABNORMAL HIGH (ref 12–15)
WBC: 8.04 10*3/uL (ref 3.50–10.80)

## 2015-09-13 LAB — BASIC METABOLIC PANEL
BUN: 14 mg/dL (ref 9.0–28.0)
BUN: 16 mg/dL (ref 9.0–28.0)
CO2: 32 mEq/L — ABNORMAL HIGH (ref 22–29)
CO2: 33 mEq/L — ABNORMAL HIGH (ref 22–29)
Calcium: 7.7 mg/dL — ABNORMAL LOW (ref 7.9–10.2)
Calcium: 7.9 mg/dL (ref 7.9–10.2)
Chloride: 107 mEq/L (ref 100–111)
Chloride: 104 mEq/L (ref 100–111)
Creatinine: 0.4 mg/dL — ABNORMAL LOW (ref 0.7–1.3)
Creatinine: 0.4 mg/dL — ABNORMAL LOW (ref 0.7–1.3)
Glucose: 119 mg/dL — ABNORMAL HIGH (ref 70–100)
Glucose: 125 mg/dL — ABNORMAL HIGH (ref 70–100)
Potassium: 4.2 mEq/L (ref 3.5–5.1)
Potassium: 4.7 mEq/L (ref 3.5–5.1)
Sodium: 146 mEq/L — ABNORMAL HIGH (ref 136–145)
Sodium: 144 mEq/L (ref 136–145)

## 2015-09-13 LAB — GFR
EGFR: >60
EGFR: >60

## 2015-09-13 LAB — TROPONIN I: Troponin I: 0.01 ng/mL (ref 0.00–0.09)

## 2015-09-13 LAB — LACTIC ACID, PLASMA: Lactic Acid: 1 mmol/L (ref 0.2–2.0)

## 2015-09-13 MED ORDER — SODIUM CHLORIDE 0.45 % IV SOLN
INTRAVENOUS | Status: AC
Start: 2015-09-13 — End: 2015-09-13
  Administered 2015-09-13: 125 mL/h via INTRAVENOUS

## 2015-09-13 MED ORDER — IOHEXOL 350 MG/ML IV SOLN
100.0000 mL | Freq: Once | INTRAVENOUS | Status: AC | PRN
Start: 2015-09-13 — End: 2015-09-13
  Administered 2015-09-13: 100 mL via INTRAVENOUS

## 2015-09-13 MED ORDER — APIXABAN 5 MG PO TABS
5.0000 mg | ORAL_TABLET | Freq: Two times a day (BID) | ORAL | Status: DC
Start: 2015-09-13 — End: 2015-09-14
  Administered 2015-09-13 – 2015-09-14 (×2): 5 mg via ORAL
  Filled 2015-09-13 (×3): qty 1

## 2015-09-13 NOTE — Progress Notes (Signed)
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 Jaynie Collins at [facility].   - s/p C6-7 ACDF, C6-C7 posterior fusion with lateral mass scews on 08/20/15 by Dr. Jaynie Collins.  - s/p  C5-T1 posterior fusion, C5-C7 anterior fusion with partial C7 corpectomy on 08/29/15 by Dr. Claudette Laws:  - NSG FU  - Hard collar at all times 2/2 neck weakness  - on Fentanyl patch  - F/U Palliative team for pain management.  - PT/OT--> acute rehab, F/U CM for [place] plan when he gets stable from pulmo stand point.      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 Will need long term trach for airway protection. Ventilatory support PRN.  - Aspiration pneumonia/atelectasis; fluid overload; poor secretions clearance, decrease FRC form prior spine surgery.  - Stenotrophomonas VAP, will complete course of Levaquin on 3/29  - Diuresis prn,  - CXR on 3/31--> Persistent bilateral pleural effusions and basilar atelectasis  - On bronchodilators, Mucomyst and trach tube care  - more secretion on trach suctioning--> on scopolamine patch--better now.   - F/U pulmo attending. Now he has been on PS  - frequent suctioning.    Aspiration pneumonia  - sputum--> Stenotrophomonas.   - completed 7 day course of Levaquin dose on 3/31 (no bactrim given sulfa allergy).   - New BAL sent 3/27--> Mixed upper resp flora   - No fever/leukocytosis  - Recent CXR on 3/28---> bibasilar opacities/atelectasis     E. Faecalis UTI.   Cath related   Foley was removed/then replaced x 2 given retention   Has finished the course      S/p PEG tube:   - Tolerating  TF.   - Avoid constipation, Good BM. No rectal tone    Hypotension - chronic, in the setting of dysautonomia  - Continue Midodrine. Off Florinef  - Continue PRN diuresis as tolerated by BP  - stable now      AF: Rate controlled. Off AC.   - Re-address anticoagulation with NSG on POD # 14 (09/12/2015)  - d/w NS team for [facility] today. Dr. Lim--> OK to resume oral Eliquis    Hypokalemia  - on oral KCL PRN, resolved  - closely monitor serum electrolytes    DVT in RLE in right profunda femoral vein  - has intermittent fever, with negative culture  - has intermittent chest pain and SOb and was also desaturating while he was in ICU  - s/p IVC filter placement recently  - will r/o acute PE today, off Eliquis due to recent spinal cord surgery  - will get CT angio of chest, will resume Eliquis as per Neurosurgery team.  - d/w family today .     GI/DVT prophylaxis: on Pepcid; on Eliquis    Case discussed with: RN, family    Safety Checklist:     DVT prophylaxis:  CHEST guideline (See page [id number]) Lovenox--> Eliquis   Foley:  [place] Rn Foley protocol yes   IVs:  piv   PT/OT: yes   Daily CBC & or Chem ordered:  SHM/ABIM guidelines (  see #5) yes   Reference for approximate charges of common labs: CBC auto diff - $76  BMP - $99  Mg - $79    Lines:     Patient Lines/Drains/Airways Status    Active PICC Line / CVC Line / PIV Line / Drain / Airway / Intraosseous Line / Epidural Line / ART Line / Line / Wound / Pressure Ulcer / NG/OG Tube     Name:   Placement date:   Placement time:   Site:   Days:    Peripheral IV 08/29/15 Left Hand  08/29/15   1345   Hand   11    Peripheral IV 08/30/15 Right Antecubital  08/30/15   0030   Antecubital   10    Peripheral IV 09/08/15 Left Forearm  09/08/15   0400   Forearm   1    Peripheral IV 09/08/15 Right Hand  09/08/15   0400   Hand   1    Peripheral IV 09/08/15 Left Forearm  09/08/15   2200   Forearm   less than 1    Gastrostomy/Enterostomy Gastrostomy-jejunostomy  09/04/15         5    Urethral  Catheter Temperature probe 16 Fr.  09/07/15   1300   Temperature probe   2    External Urinary Catheter  09/06/15   1230      3    Surgical Airway Shiley 6 mm  09/08/15   1110   6 mm   1    Wound 08/25/15 Pressure Injury Buttocks Left;Right Non-Blanchable Erythema  08/25/15   2258   Buttocks   14    Incision Site 08/20/15 Neck  08/20/15   1427     19    Incision Site 08/20/15 Neck  08/20/15   1932     19    Incision Site 08/28/15 Neck  08/28/15   2352     11    Incision Site 08/29/15 Neck  08/29/15   0515     11                 Disposition:     Today's date: 09/13/2015  Length of Stay: 27  Anticipated medical stability for discharge: 3- 4 days  Reason for ongoing hospitalization: quadriparesis  Anticipated discharge needs: TBD    Subjective     CC: Quadriplegia    Interval History/24 hour events: please see below    HPI/Subjective: Ernest Diaz is a 70 y.o. male hx afib on Eliquis + aspirin, HTN who presented to [facility] 2/25 after a fall. CT head and spine were negative so he was discharged. 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 MRI cervical and thoracic spine showed spinal epidural hematoma extending from the foramen magnum to the upper thoracic spine. Neurosurgery (Dr. Jaynie Collins) was consulted. His Eliquis was reversed. He ultimately had C3-C5 and T3-T4 decompressive laminectomies 2/27 by Dr. Jaynie Collins. He noted extensive dorsal epidural venous plexus and when he dissected out these vessels they bled. Dr. Jaynie Collins requested he be transferred here for spinal angiogram. These symptoms are sudden onset, severe intensity, without alleviating factors.     3/29. More stable at NS ICU neurologically, stable at trach collar, no fever/diarrhea. Tolerating tube feeding. Follows commands. Not in any distress.   3/30. More alert and awake, follows commands. No any acute events noted. Family at bed side.  3/31. Had desaturation intermittently since last night, had significant  secretion in his trach  suctioning. No fever. More alert and awake. Not in distress now, more comfort with PS. Has mild discomfort at trach site. No discharge from the trach site.  4/1. More alert, no any acute events noted. On PS mode. No fever, diarrhea and respiratory distress.  4/2. Has intermittent chest pain and SOB. Had low grade fever today. No cough, diarrhea, abdominal pain and chills. Not in distress. Follows commands     Physical Exam:     VITAL SIGNS PHYSICAL EXAM   Temp:  [98.6 F (37 C)-101.7 F (38.7 C)] 100.3 F (37.9 C)  Heart Rate:  [61-80] 61  Resp Rate:  [11-24] 11  BP: (91-140)/(52-89) 118/58 mmHg  FiO2:  [35 %-40 %] 40 %  Blood Glucose:    Telemetry:       Intake/Output Summary (Last 24 hours) at 09/13/15 1446  Last data filed at 09/13/15 1000   Gross per 24 hour   Intake   1210 ml   Output    600 ml   Net    610 ml    Physical Exam  General: awake, alert X 3, not in distress  HEENT- NC, AT, reactive pupils, no pallor/icterus.  Neck- on Aspen collar, trach tube in place- site: dry and clean   Cardiovascular: regular rate and rhythm, no murmurs, rubs or gallops  Lungs: clear to auscultation bilaterally, without wheezing, rhonchi, or rales  Abdomen: soft, non-tender, non-distended; no palpable masses,  normoactive bowel sounds. PEG tube site- clean/dry/no leakage  Extremities: mild edema+ in both ankles, no cyanosis/clubbng  CNS- cranial nerves- grossly intact, 3+/5 in both upper extremities, 0/5 in both lower extremities  Skin- warm, no rashes         Meds:     Medications were reviewed:    Labs:     Labs (last 72 hours):      Recent Labs  Lab 09/13/15  0315 09/12/15  0528   WBC 8.04 6.90   HGB 9.4* 9.8*   HEMATOCRIT 31.2* 31.7*   PLATELETS 178 183            Recent Labs  Lab 09/13/15  0315 09/12/15  0528   SODIUM 146* 146*   POTASSIUM 4.2 4.4   CHLORIDE 107 106   CO2 32* 35*   BUN 14.0 14.0   CREATININE 0.4* 0.5*   CALCIUM 7.7* 8.0   GLUCOSE 119* 117*                   Microbiology, reviewed  Imaging,  reviewed    Signed by: Lucillie Garfinkel, MD

## 2015-09-13 NOTE — Plan of Care (Signed)
Alert and oriented. Mouthing words and making gestures to communicate. On vent at start of shift then TM throughout day tolerating well no desats. Suction required q 1 -2 hours yellow and thick sputum sent to lab. Rhonchorous. Afebrile. Afib. At times will drop to brady but quickly comes back to 70s HR. BP WNL. Small BM. GJ tube intact no residuals and cleaned at sight. Water flushes added to 200 q 6.  Patient has not voided thus far; bladder scanned two different occasions reads <300. Will continue to monitor. Maintenance fluid running then discontinued in the middle of day. Pt began to c/o chest pain trops sent and taken to CT STAT to r/o PE. Findings negative. Starting eliquis again. Positive for DVTs in BLE. SCDs off. Family at bedside. Aspen collar on pt all day; no further c/o pain or irritation. Maintained safety. Straight cath'd 200 out. Flomax could not be given through GJ tube.

## 2015-09-13 NOTE — Progress Notes (Signed)
PROGRESS NOTE    Date Time: 09/13/2015 12:32 PM  Patient Name: [facility]      Assessment:   1. Respiratory failure status post tracheostomy.  2. Recurrent mucus plugging with recent pneumonia.  3. 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  4. Pleural effusion.  5.  DVT or right LE.    Plan:   Able to come off vent again today on T piece.  Cont to require suctioning.  Spiked a fever. Reculture sputum.  Rest on vent tonight again.  Speech to eval for PMSV in am.  Cont Duonebs.  Repeat CXR did not reveal increasing or new infiltrates.  Repeat dopplers show thrombus in profunda femoral vein on right and superficial femoral vein on left. Would favor continuing lovenox at 40 mg SQ daily and following up dopplers in 3 days.    Subjective:   Spiked to 101.7.     Medications:     Current Facility-Administered Medications   Medication Dose Route Frequency   . albuterol-ipratropium  3 mL Nebulization Q6H SCH   . balsam peru-castor oil (VENELEX)   Topical Q12H SCH   . enoxaparin  40 mg Subcutaneous Daily   . famotidine  20 mg Oral Q12H SCH    Or   . famotidine  20 mg Intravenous Q12H SCH   . fentaNYL  1 patch Transdermal Q72H   . gabapentin  200 mg per G tube QHS   . midodrine  10 mg Oral TID MEALS   . scopolamine  1 patch Transdermal Q72H   . terazosin  1 mg Oral QHS       Review of Systems:   A comprehensive review of systems was: Negative    Physical Exam:     Filed Vitals:    09/13/15 1000   BP: 101/54   Pulse: 76   Temp:    Resp: 19   SpO2: 93%   100.3    Intake and Output Summary (Last 24 hours) at Date Time    Intake/Output Summary (Last 24 hours) at 09/13/15 1232  Last data filed at 09/13/15 1000   Gross per 24 hour   Intake   1300 ml   Output    660 ml   Net    640 ml       General appearance - acyanotic, in no respiratory distress  Mental status - drowsy  Eyes - pupils equal and reactive, extraocular eye movements intact  Ears - not examined  Nose - normal and patent, no erythema,  discharge or polyps  Mouth - mucous membranes moist, pharynx normal without lesions  Neck - supple, no significant adenopathy and neck collar in place  Lymphatics - no palpable lymphadenopathy, no hepatosplenomegaly  Chest - rhonchi noted bilaterally  Heart - normal rate, regular rhythm, normal S1, S2, no murmurs, rubs, clicks or gallops  Abdomen - soft, nontender, nondistended, no masses or organomegaly  Extremities - peripheral pulses normal, no pedal edema, no clubbing or cyanosis    Labs:     Results     Procedure Component Value Units Date/Time    CULTURE + Dierdre Forth [[id number]] Collected:  09/13/15 1047    Specimen Information:  Sputum from Endotracheal Aspirate Updated:  09/13/15 1156    CULTURE BLOOD AEROBIC AND ANAEROBIC [[id number]] Collected:  09/13/15 0328    Specimen Information:  Blood, Venipuncture Updated:  09/13/15 0622    Narrative:      1 BLUE+1 PURPLE    Lactic  acid, plasma [[id number]] Collected:  09/13/15 0318    Specimen Information:  Blood Updated:  09/13/15 0350     Lactic acid 1.0 mmol/L     CBC and differential [[id number]]  (Abnormal) Collected:  09/13/15 0315    Specimen Information:  Blood from Blood Updated:  09/13/15 0348     WBC 8.04 x10 3/uL      Hgb 9.4 (L) g/dL      Hematocrit 24.4 (L) %      Platelets 178 x10 3/uL      RBC 3.04 (L) x10 6/uL      MCV 102.6 (H) fL      MCH 30.9 pg      MCHC 30.1 (L) g/dL      RDW 16 (H) %      MPV 10.9 fL      Neutrophils 79 %      Lymphocytes Automated 11 %      Monocytes 9 %      Eosinophils Automated 0 %      Basophils Automated 0 %      Immature Granulocyte 1 %      Nucleated RBC 0 /100 WBC      Neutrophils Absolute 6.33 x10 3/uL      Abs Lymph Automated 0.89 x10 3/uL      Abs Mono Automated 0.74 x10 3/uL      Abs Eos Automated 0.03 x10 3/uL      Absolute Baso Automated 0.01 x10 3/uL      Absolute Immature Granulocyte 0.04 x10 3/uL     Basic Metabolic Panel [[id number]]  (Abnormal) Collected:  09/13/15 0315    Specimen  Information:  Blood Updated:  09/13/15 0347     Glucose 119 (H) mg/dL      BUN 64.4 mg/dL      Creatinine 0.4 (L) mg/dL      Calcium 7.7 (L) mg/dL      Sodium 034 (H) mEq/L      Potassium 4.2 mEq/L      Chloride 107 mEq/L      CO2 32 (H) mEq/L     GFR [[id number]] Collected:  09/13/15 0315     EGFR >60.0 Updated:  09/13/15 0347          Recent CBC   Recent Labs      09/13/15   0315   RBC  3.04*   HGB  9.4*   HEMATOCRIT  31.2*   MCV  102.6*   MCH  30.9   MCHC  30.1*   RDW  16*   MPV  10.9     Recent BMP   Recent Labs      09/13/15   0315   GLUCOSE  119*   BUN  14.0   CREATININE  0.4*   CALCIUM  7.7*   SODIUM  146*   POTASSIUM  4.2   CHLORIDE  107   CO2  32*       Rads:   Radiological Procedure reviewed.    Signed by: Sherrie Mustache, MD

## 2015-09-13 NOTE — Plan of Care (Addendum)
Please refer to flowsheets for vitals and trends.  Pt initially alert, nodded appropriately, followed commands.  Pt had moderate thick white secretions from trach at that time (Q1-2 H suction). After pt fell asleep he had only a small amount of secretions with trach suction (Q4H).      RN noticed skin on pt's anterior neck was red and edematous at beginning of shift.  At 0300 RN noticed swelling had increased.  Pt had temp of 101.7  MD Cyndie Chime came to see pt.  Lactic (negative) and BC drawn.  Per MD Ngyuen, RN removed aspen for a couple hours.  When RN put collar on, pt was very uncomfortable.  He continually pulled at collar.  When RN removed it and reattached vent tubing, pt almost cried in pan.  RN spoke with MD Ngyen.  Will leave aspen off the rest of the night shift, and apply later during day shift.  RN gave PRN morphine once at that time with moderate results.      Pt remained in A-fib, w/ frequent PVCs, HR 50-60.  Pt's foley removed yesterday at 1700.  Pt did not void on his own.  RN straight cathed pt at 2330 and removed 200 of dark amber urine (315 mL on bladder scan).  Pt did not void after that.  RN straight cathed pt a second time at 0545 and removed 225 of dark amber urine (scan showed 293 mL).  MD Ngyen aware, and maintenance fluids started.      Pt updated and support given.

## 2015-09-13 NOTE — Progress Notes (Signed)
S: no events noted by nurse overnight; minimal tracheal secretions per nurse; some irritation noted around trach site per nurse    Abx:  S/p 8 days of levaquin    CENTRAL LINES:  NONE  PIV--no erythema noted    O: Tm 101.7 Tc 100.3  HR 70s  RR 17 BP 98/55  98% TC 40% FIO2 peep 5  NAD, alert  Trach in place  Rhonchi bil  rrr no m/g  Soft, nt PEG tube in place  No edema    Labs:  Wbc 8.04  Hb 9.4 plts 178  Cr 0.4    Micro:  4/4:  Blood cx pending  3/27 BAL cx upper flora   3/22 sputum cx Stenotrophomonas maltophilia , mixed upper flora   3/20 flu negative   3/18 cervical wound cx cut flora  3/18 cervical wound anaerobic cx ngtd  3/18 cervical wound AFB and fungal cx Ngtd  3/13 Ucx >100,000 E. Faecalis   3/13 Blood cx Ngtd  3/6 MRSA negative     3/9 path FIBROVASCULAR TISSUE, FEW VESSELS AND BONE CHIPS     Recs: 70 y/o male suffering paraparesis after fall due to epidural hematoma. Pt s/p C3-C5 decompressive laminectomies.  Pt had worsening weakness due to malposition of hardware and taken back to OR for redo.  Hospital course complicated by PNA, likely aspiration.  1. Aspiration PNA: s/p bronchoscopy with thick white secretions noted in left mainstem bronchus.  Pt s/p trach 09/08/15. Pt has completed a course of abx.  Monitor closely for new infection/respiratory decline.  2. Fevers:  Blood cx pending; I will order u/a.  Hold on abx at this time.

## 2015-09-14 DIAGNOSIS — R0602 Shortness of breath: Secondary | ICD-10-CM | POA: Insufficient documentation

## 2015-09-14 DIAGNOSIS — R531 Weakness: Secondary | ICD-10-CM

## 2015-09-14 DIAGNOSIS — R1312 Dysphagia, oropharyngeal phase: Secondary | ICD-10-CM | POA: Insufficient documentation

## 2015-09-14 LAB — BASIC METABOLIC PANEL
BUN: 16 mg/dL (ref 9.0–28.0)
CO2: 31 mEq/L — ABNORMAL HIGH (ref 22–29)
Calcium: 8 mg/dL (ref 7.9–10.2)
Chloride: 104 mEq/L (ref 100–111)
Creatinine: 0.4 mg/dL — ABNORMAL LOW (ref 0.7–1.3)
Glucose: 110 mg/dL — ABNORMAL HIGH (ref 70–100)
Potassium: 4.7 mEq/L (ref 3.5–5.1)
Sodium: 143 mEq/L (ref 136–145)

## 2015-09-14 LAB — CBC AND DIFFERENTIAL
Basophils Absolute Automated: 0.01 10*3/uL (ref 0.00–0.20)
Basophils Automated: 0 %
Eosinophils Absolute Automated: 0.03 10*3/uL (ref 0.00–0.70)
Eosinophils Automated: 0 %
Hematocrit: 32 % — ABNORMAL LOW (ref 42.0–52.0)
Hgb: 9.7 g/dL — ABNORMAL LOW (ref 13.0–17.0)
Immature Granulocytes Absolute: 0.03 10*3/uL
Immature Granulocytes: 0 %
Lymphocytes Absolute Automated: 1.07 10*3/uL (ref 0.50–4.40)
Lymphocytes Automated: 15 %
MCH: 30.9 pg (ref 28.0–32.0)
MCHC: 30.3 g/dL — ABNORMAL LOW (ref 32.0–36.0)
MCV: 101.9 fL — ABNORMAL HIGH (ref 80.0–100.0)
MPV: 11.1 fL (ref 9.4–12.3)
Monocytes Absolute Automated: 0.5 10*3/uL (ref 0.00–1.20)
Monocytes: 7 %
Neutrophils Absolute: 5.32 10*3/uL (ref 1.80–8.10)
Neutrophils: 76 %
Nucleated RBC: 0 /100 WBC (ref 0–1)
Platelets: 190 10*3/uL (ref 140–400)
RBC: 3.14 10*6/uL — ABNORMAL LOW (ref 4.70–6.00)
RDW: 15 % (ref 12–15)
WBC: 6.96 10*3/uL (ref 3.50–10.80)

## 2015-09-14 LAB — GFR: EGFR: >60

## 2015-09-14 MED ORDER — SODIUM CHLORIDE 0.9 % IV MBP
2.0000 g | Freq: Three times a day (TID) | INTRAVENOUS | Status: DC
Start: 2015-09-14 — End: 2015-09-14
  Administered 2015-09-14: 2 g via INTRAVENOUS
  Filled 2015-09-14: qty 2

## 2015-09-14 MED ORDER — VANCOMYCIN HCL 1000 MG IV SOLR
15.0000 mg/kg | Freq: Two times a day (BID) | INTRAVENOUS | Status: DC
Start: 2015-09-14 — End: 2015-09-14

## 2015-09-14 MED ORDER — VANCOMYCIN 1250 MG IN 250 ML NS (CNR)
1250.0000 mg | Freq: Two times a day (BID) | Status: DC
Start: 2015-09-14 — End: 2015-09-16
  Administered 2015-09-14 – 2015-09-16 (×5): 1250 mg via INTRAVENOUS
  Filled 2015-09-14 (×6): qty 250

## 2015-09-14 MED ORDER — DEXTROSE 5 % ORAL SYRINGE
60.0000 mL | Freq: Two times a day (BID) | ORAL | Status: DC
Start: 2015-09-14 — End: 2015-09-24
  Administered 2015-09-15 – 2015-09-24 (×19): 60 mL via NASOGASTRIC
  Filled 2015-09-14 (×22): qty 60

## 2015-09-14 MED ORDER — FUROSEMIDE 10 MG/ML IJ SOLN
40.0000 mg | Freq: Two times a day (BID) | INTRAMUSCULAR | Status: AC
Start: 2015-09-14 — End: 2015-09-15
  Administered 2015-09-14 – 2015-09-15 (×2): 40 mg via INTRAVENOUS
  Filled 2015-09-14 (×2): qty 4

## 2015-09-14 MED ORDER — APIXABAN 5 MG PO TABS
5.0000 mg | ORAL_TABLET | Freq: Two times a day (BID) | ORAL | Status: DC
Start: 2015-09-14 — End: 2015-09-24
  Administered 2015-09-14 – 2015-09-24 (×20): 5 mg via NASOGASTRIC
  Filled 2015-09-14 (×19): qty 1

## 2015-09-14 MED ORDER — FUROSEMIDE 10 MG/ML IJ SOLN
40.0000 mg | Freq: Two times a day (BID) | INTRAMUSCULAR | Status: DC
Start: 2015-09-14 — End: 2015-09-14

## 2015-09-14 MED ORDER — SODIUM CHLORIDE 0.9 % IV MBP
1.0000 g | Freq: Three times a day (TID) | INTRAVENOUS | Status: DC
Start: 2015-09-14 — End: 2015-09-21
  Administered 2015-09-14 – 2015-09-21 (×21): 1 g via INTRAVENOUS
  Filled 2015-09-14 (×22): qty 1

## 2015-09-14 MED ORDER — DEXTROSE 5 % IV SOLN
Freq: Two times a day (BID) | INTRAVENOUS | Status: DC
Start: 2015-09-14 — End: 2015-09-14
  Filled 2015-09-14: qty 100

## 2015-09-14 MED ORDER — RISAQUAD PO CAPS
2.0000 | ORAL_CAPSULE | Freq: Every day | ORAL | Status: DC
Start: 2015-09-14 — End: 2015-09-24
  Administered 2015-09-14 – 2015-09-24 (×11): 2 via ORAL
  Filled 2015-09-14: qty 1
  Filled 2015-09-14 (×2): qty 2
  Filled 2015-09-14 (×4): qty 1
  Filled 2015-09-14 (×2): qty 2
  Filled 2015-09-14 (×3): qty 1
  Filled 2015-09-14: qty 2
  Filled 2015-09-14: qty 1

## 2015-09-14 NOTE — Addendum Note (Signed)
Addendum  created 09/14/15 1234 by Fermin Schwab, MD    Modules edited: Clinical Notes    Clinical Notes:  File: [id number]

## 2015-09-14 NOTE — OT Progress Note (Signed)
[facility]   Occupational Therapy Reassessment    Patient: Ernest Diaz    MRN#: [id number]   Unit: [facility] TOWER 4  Bed: V409/W119.14                                   Discharge Recommendations:   Discharge Recommendation: SNF   DME Recommended for Discharge:  (TBD at SNF)    If SNF recommended discharge disposition is not available, patient will need 2-person total assist for mobility and max-total assist for ADLs/IADLs, hospital bed, hoyer lift, w/c, bsc, stretcher transport equipment, and HHOT.     Discharge recommendations are subject to change based on patient's progress. Please refer to the most recent treatment note for the most up to date recommendation.  Assessment:   Ernest Diaz is a 70 y.o. male admitted 08/17/2015. Max assist-dependent x2 to transfer supine<>sit at EOB. Neuro re-ed tasks completed at EOB to increase postural control. Max assist to maintain sitting balance. Max assist-dependent to complete ADLs. Hands on family training provided to pt's wife and sister to facilitate pt participation in BADLs and HEP. Pt continues to present with decreased strength/endurance, negatively impacting pt's ability to complete ADLs/IADLs/functional transfers at baseline level. Pt will continue to benefit from skilled OT to maximize independence with ADL performance.    Therapy Diagnosis: Decreased independence with ADL performance    Rehabilitation Potential: good for stated goals    Treatment Activities: OT Re-assessment, Modified ADL retraining, functional transfer training to sit EOB, modified bed mobility training, education on energy conservation/fall prevention techniques, compensatory technique education, UE strengthening, endurance training, Relaxation/pain management techniques    Educated the patient to role of occupational therapy, plan of care, goals of therapy and HEP, safety with mobility and ADLs, energy conservation techniques,  spine precautions, home safety.    Plan:    OT Frequency Recommended: 2-3x/wk       Treatment/Interventions: Modified ADL retraining, functional transfer training, modified bed mobility training, education on energy conservation/fall prevention techniques, compensatory technique education, UE strengthening, endurance training, Relaxation/pain management techniques, Education on car and shower transfers, Education on medical equipment beneficial for increasing ADL performance and safety in home environment.        Risks/benefits/POC discussed with pt and family       Reason for reassessment:  transferred to NSICU with aspiration PNA 3/22, PEG placed 3/24 (intubated/extubated for procedure), re-intubated 3/27, trach placed 3/28. Pt now downgraded to [facility]    Precautions and Contraindications:   Falls  C-spine precautions  Aspen collar    Consult received for Ernest Diaz for OT Evaluation and Treatment.  Patient's medical condition is appropriate for Occupational Therapy intervention at this time.      History of Present Illness:   Updated History of Present Illness: Ernest Diaz is a 70 y.o. male  admitted from OSH s/p fall. Pt found to have epidural hematoma and underwent C3-5 and T3-4 lami at OSH.  On 3/09 pt s/p C6-7 lami/fusion. On 3/17 pt developed paraplegia and was transferred to ICU with hardware failure. Re-do lami/fusion C5-T1 performed 3/18. IV filter placed 3/20. PEG placed 3/24. Trach 3/28.       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 No CT evidence of pulmonary embolism.  2. Large bilateral pleural effusions with adjacent infiltrates worrisome  for pneumonia    US Venous BLEs  IMPRESSION:   1. Thrombosis right profunda femoral vein.  2. Thrombosis left greater saphenous vein.  Findings  discussed with and acknowledged by Dr. Lemar Livings at 12:25 pm.    Subjective: pt non-verbal 2/2 trach/vent   Patient is agreeable to participation in the therapy session. Family and/or guardian are agreeable to patient's participation in the therapy session. Nursing clears patient for therapy.     Patient Goal: get stronger    Pain:   Scale: denied pain  Location:   Intervention:     Objective:   Patient is in bed with [facility] monitors, trach, vent, PEG, PIV access, Aspen c-collar donned, bed alarm, fall mat in place    Cognitive Status:  Pt able to follow simple one step commands with repetition and t/v cueing    Musculoskeletal Examination  RUE ROM: WFL within precautions  LUE ROM: WFL within precautions  RLE ROM: grossly assessed: WFL  LLE ROM: grossly assessed: WFL    RUE Strength: shoulder flex: 3-/5, shoulder ext: 2/5, elbow flex: 3/5, elbow ext: 2+/5, wrist flex: 2/5, wrist ext: 0/5, grip: 0/5  LUE Strength: shoulder flex: 3-/5, shoulder ext: 2/5, elbow flex: 3/5, elbow ext: 2+/5, wrist flex: 2/5, wrist ext: 0/5, grip: 0/5  RLE Strength: grossly assessed: 0/5  LLE Strength: grossly assessed: 0/5    Sensory/Oculomotor Examination  Auditory: WFL  Tactile: intact to light touch  Vision: no changes    Neuro Examination  Coordination: impaired    Activities of Daily Living  Eating: NT  Grooming: max assist  Bathing: max assist  UE Dressing: max assist  LE Dressing: dependent  Toileting: dependent    Functional Mobility:  Rolling: max assist x2  Supine to Sit: max assist-dependent x2  Sit to Supine: dependent x2  Scooting: max assist  Sit to Stand: NT  Stand to Sit: NT  Transfers: NT     Balance  Static Sitting: max assist with extra support on L side  Dynamic Sitting: max assist x2  Static Standing: NT  Dynamic Standing: NT    Participation and Activity Tolerance  Participation Effort: good  Endurance: fair (fatigued quickly; however VSS)    Patient left with call bell within reach, all needs met, SCDs not in place  2/2 DVTs, fall mat in place, bed alarm engaged in reclined bed-chair position, chair alarm n/a and all questions answered. RN notified of session outcome and patient response.         Goals:  Time For Goal Achievement: 5 visits  ADL Goals  Patient will groom self: Moderate Assist  Patient will dress upper body: Moderate Assist (with modified techniques)  Patient will dress lower body: Moderate Assist, with AE  Pt will complete bathing: Moderate Assist  Patient will toilet: Moderate Assist  Mobility and Transfer Goals  Pt will perform functional transfers: Moderate Assist  Neuro Re-Ed Goals  Pt will perform dynamic sitting balance: Minimal Assist, to increase ability to complete ADLs  Pt will sit at edge of bed: Stand by Assist, to prepare for OOB tasks  Musculoskeletal Goals  Pt will perform Home Exercise Program: stand by assist, with caregiver/family assist, to increase engagement in ADLs                   Caleen Jobs. Angad Nabers, OTR/L, pager# [phone]  Time of treatment:   OT Received On: 09/14/15  Start Time: 1235  Stop Time: 1340  Time Calculation (min): 65 min

## 2015-09-14 NOTE — Progress Notes (Signed)
ID PROGRESS NOTE    Date Time: 09/14/2015 12:49 PM  Patient Name: Ernest Diaz        Subjective, ROS:    In [facility]    Fever subsiding, hemodynamically stable   Stable labs     Family at bedside    Feels ok  Wants to eat  On vent  Denies SOB  No vomiting, tolerating TF       Antibiotics and culture results:   Antibiotics: cefepime and vancomycin #1  Midodrine   S/P vanc , zosyn       Cultures:  4/3 legionella antigen negative   4/2 Ucx P  4/2 sputum cx GPC  4/2 Blood cx Ngtd  3/27 BAL cx upper flora   3/22 sputum cx Stenotrophomonas maltophilia , mixed upper flora    3/20 flu negative   3/18 cervical wound cx cut flora  3/18 cervical wound anaerobic cx ngtd  3/18 cervical wound AFB and fungal cx Ngtd  3/13 Ucx >100,000 E. Faecalis   3/13 Blood cx Ngtd  3/6 MRSA negative     3/9 path FIBROVASCULAR TISSUE, FEW VESSELS AND BONE CHIPS       Lines: PIV access    Physical Exam:     Filed Vitals:    09/14/15 1200   BP: 139/85   Pulse: 72   Temp:    Resp: 12   SpO2: 100%       General: in bed, in [facility], awake and  communicative. vented , son  at bedside   HEENT: slightly dry mucosa ,  pale, not icteric , cervical collar in place , trach in place , area above trach still with some swelling , better than Friday   Chest: S1S2 irregular , coarse BS bilaterally,  no wheeze , BS at bases decreased.   Abdomen: soft, not tender , BS +, PEG in place   Ext: + edema   Weakness  , no movement in legs   Right arm stronger than left    Foley in place       Labs:       Recent CBC WITH DIFF   Recent Labs      09/14/15   0036   WBC  6.96   RBC  3.14*   HGB  9.7*   HEMATOCRIT  32.0*   MCV  101.9*       Recent CMP   Recent Labs      09/14/15   0036   GLUCOSE  110*   BUN  16.0   CREATININE  0.4*   SODIUM  143   POTASSIUM  4.7   CHLORIDE  104   CO2  31*           Rads:   4/2 CT No CT evidence of pulmonary embolism.Large bilateral pleural effusions with adjacent infiltrates worrisome  for pneumonia    4/2 doppler Thrombosis right  profunda femoral vein. Thrombosis left greater saphenous vein    3/23 sono Similar findings as seen on recent CT. Thick-walled bladder without significant distention with pericholecystic fluid. Lack of gallstones and clear focal tenderness makes cholecystitis less likely. The patient was uncooperative for this portable examination however. Differential considerations would include hypoalbuminemia, chronic cholecystitis or acute cholecystitis. If continued clinical concern can perform HIDA scan. Bilateral effusions. Patient refused renal evaluation.    3/22 CT  Decompressed stomach located within the anterior abdomen extending  below the left hepatic lobe margin and above the transverse colon.. Nonspecific  gallbladder wall thickening with pericholecystic fluid and stranding in the presence of small ascites. Recommend clinical correlation for acute cholecystitis.. Bilateral small effusions and basilar lower lobe consolidative atelectasis. Cardiac enlargement.    3/20 IRSuccessful deployment of a retrievable inferior vena cava filter    3/19 doppler Nonocclusive deep venous thrombosis involving a short segment of the proximal femoral vein.    3/17 CT  Markedly abnormal examination with anterolisthesis of C6 upon C7  measuring 16 mm with a jumped facet on the left and a comminuted displaced fracture involving the right C7 articular facet. Anterior cervical spinal fusion hardware is malpositioned with both plate and screws extending into the C6-C7 disc space.  The AP dimension of the bony spinal canal at the superior C5 level is reduced to 5 mm.       3/8 MRI Postsurgical change with prior laminectomy. 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 There findings suspicious for a disc ligamentous injury at this level. Correlation with CT is advised    3/7 IR No angiographic evidence of intracranial aneurysm, arteriovenous malformation or dural fistula. Is no angiographic evidence of  spinal arteriovenous malformation or dural fistula.  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   - worsening of weakness due to Fracture dislocation C6-C7 with malpositioned hardware  - S/P C5-T1 posterior fusion, C5-C7 anterior fusion with partial C7 corpectomy on 3/18. cx normal flora     Other issues during admission:  Fever/SIRS on 3/12   Symptoms of aspiration with increased cough and mild-mod oropharyngeal dysphagia   Urinary retention, required reinsertion of foley and E. Faecalis UTI  Leukocytosis  DVT (S/P IVC filter)    Hypoxic respiratory failure overnight 3/21-3/22   Now with Near complete opacification of the left lung   Gallbladder wall thickening per imaging   DVT       Plan:   1. SIRS.   Was due to UTI and aspiration pneumonia.    Recent fever could be due to pneumonia/also DVT  Repeat cultures pending.     Back on broad spectrum antibiotics  Fever subsiding and hemodynamically more stable.     2. Recurrent Hypoxic respiratory failure and aspiration pneumonia .  Episode on 3/21-22 due to aspiration/malositioned Corpak (per xray not in the stomach), required BiPAP and NRB. (Sputum cx normal flora and Stenotrophomonas maltophilia)     Near complete opacification of the left lung on 3/27 xray , probably mucous plug after extubation.      S/P intubation and bronch on 3/27  . ' Copious thick white secretions were noted in the leftmainstem bronchus. Left airway mucosa was edematous and friable with some mucosal bleeding' .  Sputum cx normal flora     S/P trach 3/28     S/P a course of levaquin     Last CT with large effusion and consolidation/   Repeat cx pending./   Back on antibiotics. Switch cefepime to ceftazidime  Need for thoracentesis per pulmonary  3. E. Faecalis UTI.   Cath related   Foley was removed/then replaced x 2 given retention   Has finished the course. \  Repeat Ucx pending      4. 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   S/P C5-T1 posterior fusion, C5-C7 anterior fusion with partial C7 corpectomy on 3/18. cx normal flora     5. Leukocytosis.   Due to above.   Resolved.    6. Gallbladder wall thickening  No symptoms suggestive of cholecystitis for now     7. DVT   Per primary     8. Goals of care.   Palliative care on board     Discussed with Dr. Verlin Grills   Discussed with family        Signed by: [place]. [id number]  Infectious Disease  Phone: [phone]  Fax: [phone]

## 2015-09-14 NOTE — Plan of Care (Signed)
N: Neuro checks Q 4 hrs. No changes noted throughout the night. Alert and oriented x 3. Gross motor movement in BUE. Aspen collar on and aligned  CV: A.fib, HR 45-60's. SBP 90-130's. Temp 99.2.   P: PS 40%, switched to PRVC around 0645 d/t desaturations to mid 80's. Suctioning Q 3 hrs. Neck noted to be red, swollen and tender above trach plate, Dr. Franki Cabot notified. PRN morphine given for pain  GI/GU: No BM. Urinary retention, foley catheter placed.   Skin: Wound care provided, Turn Q 2 hrs    Emotional support was given and education with patient performed. Safety rounds assessed hourly and as needed for pain, positioning, bathroom, and environment.

## 2015-09-14 NOTE — Progress Notes (Signed)
WOC RN NOTE:    Attempt to consult for trach wound. Received OK from pt's primary [facility] RN to take down part of Aspen to inspect, when taking down Aspen and lowering head a bit, pt's desats into 80s, any movement near trach not agreeing with pt.  Did not that pt's trach collar extremely tight over the posterior aspect of neck, however unable to change at this time secondary to desat. Let pt's RN know,  Respiratory into room and pt back up into mid 90's.    Pt appears to have wound posterior neck, although unable to view at this time secondary to 02 sats. Also unable to view under trach for same.    Pt presented off loaded to side. Did not turn pt further at this time.  Discussed with pt's primary RN.    Will try and see pt tomorrow with coordination of trach care.    Alver Fisher, RN, [facility]  [employer] [phone]  Pager [phone]

## 2015-09-14 NOTE — Progress Notes (Signed)
Palliative Medicine Follow up Note   Date Time: 09/14/2015   Patient Name: Ernest Diaz  Attending physician:  Balinda Quails Man, MD   Palliative care provider: Crista Curb    IMPRESSION:  70 year old male with hx of HTN and Afib, s/p paraparesis after a fall due to epidural hematoma     RECOMMENDATIONS:  1. Dyspnea-chronic hypoxic/hypercarbic respiratory failure-s/ptrach- recurrent mucus plugging with recent PNA-continue gentle frequent suctioning as tolerated/indicated.   2. Dysphagia-s/p peg-tolerating tube feeds-per nutrition.   3. 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 Allow periods of rest. Fall precautions in place. Ambulate with assistance.   4. Anticipated disposition: Per primary team     Andreka Stucki D Joan Mayans, MSN, BSN, RN   Palliative Medicine   Spectra: [phone] M-F 9-4  Extend Pager: Palliative Medicine [facility] 24/7  Subjective:    Patient resting in bed, appears comfortable  Copious secretions-RN notified to suction     Medications:     Current Facility-Administered Medications   Medication Dose Route Frequency   . albuterol-ipratropium  3 mL Nebulization Q6H SCH   . apixaban  5 mg Oral Q12H SCH   . balsam peru-castor oil (VENELEX)   Topical Q12H SCH   . cefTAZidime  1 g Intravenous Q8H SCH   . famotidine  20 mg Oral Q12H SCH    Or   . famotidine  20 mg Intravenous Q12H SCH   . fentaNYL  1 patch Transdermal Q72H   . furosemide  40 mg Intravenous Q12H   . gabapentin  200 mg per G tube QHS   . lactobacillus/streptococcus  2 capsule Oral Daily   . midodrine  10 mg Oral TID MEALS   . scopolamine  1 patch Transdermal Q72H   . terazosin  1 mg Oral QHS   . vancomycin  1,250 mg Intravenous Q12H     Current Facility-Administered Medications   Medication Dose Route   . acetaminophen  325 mg Oral   . albuterol  2.5 mg Nebulization   . bisacodyl  10 mg Rectal   . dextrose  15 g of  glucose Oral    And   . dextrose  25 mL Intravenous    And   . glucagon (rDNA)  1 mg Intramuscular   . diphenhydrAMINE  12.5 mg per G tube   . hydrALAZINE  10 mg Intravenous   . labetalol  10 mg Intravenous   . methocarbamol  750 mg Oral   . morphine  10 mg per G tube   . naloxone  0.2 mg Intravenous   . ondansetron  4 mg Intravenous   . phenol  1 spray Oral         Physical Exam:     Filed Vitals:    09/14/15 1632   BP:    Pulse:    Temp:    Resp:    SpO2: 93%     General: ill appearing, appears in no acute distress  ENT/Oral: mucous membranes dry, oropharynx clear without lesions or thrush,   CV: regular rate and rhythm  Lungs:rhonchi bilaterally   Abd: soft, non-tender, non-distended; normoactive bowel sounds  Ext: no clubbing, cyanosis, or edema  Labs:       Recent Labs  Lab 09/14/15  0036   WBC 6.96   HGB 9.7*   HEMATOCRIT 32.0*   PLATELETS 190         Recent Labs  Lab 09/14/15  0036   SODIUM 143   POTASSIUM 4.7   CHLORIDE 104   CO2 31*   BUN 16.0   CREATININE 0.4*   EGFR >60.0   GLUCOSE 110*   CALCIUM 8.0             No results found.

## 2015-09-14 NOTE — Progress Notes (Signed)
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 Jaynie Collins at [facility].   - s/p C6-7 ACDF, C6-C7 posterior fusion with lateral mass scews on 08/20/15 by Dr. Jaynie Collins.  - s/p  C5-T1 posterior fusion, C5-C7 anterior fusion with partial C7 corpectomy on 08/29/15 by Dr. Claudette Laws:  - NSG FU  - Hard collar at all times 2/2 neck weakness  - on Fentanyl patch  - F/U Palliative team for pain management.  - PT/OT--> acute rehab, F/U CM for [facility] plan when he gets stable from pulmo stand point.      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 Will need long term trach for airway protection. Ventilatory support PRN.  - Aspiration pneumonia/atelectasis; fluid overload; poor secretions clearance, decrease FRC form prior spine surgery.  - Stenotrophomonas VAP, will complete course of Levaquin on 3/29  - Diuresis prn,  - CXR on 3/31--> Persistent bilateral pleural effusions and basilar atelectasis  - On bronchodilators, Mucomyst and trach tube care  - more secretion on trach suctioning--> on scopolamine patch--better now.   - F/U pulmo attending. Now he has been on PS  - frequent suctioning.    Aspiration pneumonia and bilateral pleural effusion  - sputum--> Stenotrophomonas.   - completed 7 day course of Levaquin dose on 3/31 (no bactrim given sulfa allergy).   - New BAL sent 3/27--> Mixed upper resp flora   - Recent CXR on 3/28---> bibasilar opacities/atelectasis  - Had fever and intermittent desaturation until 4/2.  - CT angio of chest (4/2)--> Large bilateral pleural effusions with adjacent infiltrates  worrisome for pneumonia  - started IV Vanco and Fortaz as per ID. F/U ID and pulmo attendings.   - if it gets worse--> will need thoracentesis, as per Dr. Lemar Livings, no intervention is required.     E. Faecalis UTI.   Cath related   Foley was removed/then replaced x 2 given retention   Has finished the course      S/p PEG tube:   - Tolerating TF.   - Avoid constipation, Good BM. No rectal tone    Hypotension - chronic, in the setting of dysautonomia  - Continue Midodrine. Off Florinef  - Continue PRN diuresis as tolerated by BP  - stable now      AF: Rate controlled.    - d/w NS team for [facility] on 4/2; Dr. Lim--> OK to resume oral Eliquis    Hypokalemia  - on oral KCL PRN, resolved  - closely monitor serum electrolytes    DVT in RLE in right profunda femoral vein  - has intermittent fever, with negative culture  - has intermittent chest pain and SOb and was also desaturating while he was in ICU  - s/p IVC filter placement recently  - Negative for PE as per CT angio on 4/2. Was off Eliquis due to recent spinal cord surgery  - Resumed his Eliquis as per Neurosurgery team.  - d/w family and NS team before starting Eliquis. Possible risks/complications discussed with family     GI/DVT prophylaxis: on Pepcid; on Eliquis  Case discussed with: RN, family    Safety Checklist:     DVT prophylaxis:  CHEST guideline (See page [id number]) Lovenox--> Eliquis   Foley:  [place] Rn Foley protocol yes   IVs:  piv   PT/OT: yes   Daily CBC & or Chem ordered:  SHM/ABIM guidelines (see #5) yes   Reference for approximate charges of common labs: CBC auto diff - $76  BMP - $99  Mg - $79    Lines:     Patient Lines/Drains/Airways Status    Active PICC Line / CVC Line / PIV Line / Drain / Airway / Intraosseous Line / Epidural Line / ART Line / Line / Wound / Pressure Ulcer / NG/OG Tube     Name:   Placement date:   Placement time:   Site:   Days:    Peripheral IV 08/29/15 Left Hand  08/29/15   1345   Hand   11    Peripheral IV 08/30/15 Right  Antecubital  08/30/15   0030   Antecubital   10    Peripheral IV 09/08/15 Left Forearm  09/08/15   0400   Forearm   1    Peripheral IV 09/08/15 Right Hand  09/08/15   0400   Hand   1    Peripheral IV 09/08/15 Left Forearm  09/08/15   2200   Forearm   less than 1    Gastrostomy/Enterostomy Gastrostomy-jejunostomy  09/04/15         5    Urethral Catheter Temperature probe 16 Fr.  09/07/15   1300   Temperature probe   2    External Urinary Catheter  09/06/15   1230      3    Surgical Airway Shiley 6 mm  09/08/15   1110   6 mm   1    Wound 08/25/15 Pressure Injury Buttocks Left;Right Non-Blanchable Erythema  08/25/15   2258   Buttocks   14    Incision Site 08/20/15 Neck  08/20/15   1427     19    Incision Site 08/20/15 Neck  08/20/15   1932     19    Incision Site 08/28/15 Neck  08/28/15   2352     11    Incision Site 08/29/15 Neck  08/29/15   0515     11                 Disposition:     Today's date: 09/14/2015  Length of Stay: 28  Anticipated medical stability for discharge: 3- 4 days  Reason for ongoing hospitalization: quadriparesis  Anticipated discharge needs: TBD    Subjective     CC: Quadriplegia    Interval History/24 hour events: please see below    HPI/Subjective: Ernest Diaz is a 70 y.o. male hx afib on Eliquis + aspirin, HTN who presented to [facility] 2/25 after a fall. CT head and spine were negative so he was discharged. 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 MRI cervical and thoracic spine showed spinal epidural hematoma extending from the foramen magnum to the upper thoracic spine. Neurosurgery (Dr. Jaynie Collins) was consulted. His Eliquis was reversed. He ultimately had C3-C5 and T3-T4 decompressive laminectomies 2/27 by Dr. Jaynie Collins. He noted extensive dorsal epidural venous plexus and when he dissected out these vessels they bled. Dr. Jaynie Collins requested he be transferred here for spinal angiogram. These symptoms are sudden onset, severe intensity, without alleviating factors.  3/29. More stable at NS ICU neurologically, stable at trach collar, no fever/diarrhea. Tolerating tube feeding. Follows commands. Not in any distress.   3/30. More alert and awake, follows commands. No any acute events noted. Family at bed side.  3/31. Had desaturation intermittently since last night, had significant secretion in his trach suctioning. No fever. More alert and awake. Not in distress now, more comfort with PS. Has mild discomfort at trach site. No discharge from the trach site.  4/1. More alert, no any acute events noted. On PS mode. No fever, diarrhea and respiratory distress.  4/2. Has intermittent chest pain and SOB. Had low grade fever today. No cough, diarrhea, abdominal pain and chills. Not in distress. Follows commands  4/3. no any acute events noted. Still has been on PS, no fever today. More stable.      Physical Exam:     VITAL SIGNS PHYSICAL EXAM   Temp:  [98.5 F (36.9 C)-100.3 F (37.9 C)] 99 F (37.2 C)  Heart Rate:  [60-76] 73  Resp Rate:  [11-51] 14  BP: (97-139)/(54-106) 97/54 mmHg  FiO2:  [40 %] 40 %  Blood Glucose:    Telemetry:       Intake/Output Summary (Last 24 hours) at 09/14/15 0727  Last data filed at 09/14/15 0400   Gross per 24 hour   Intake   1220 ml   Output    550 ml   Net    670 ml    Physical Exam  General: awake, alert X 3, not in distress  HEENT- NC, AT, reactive pupils, no pallor/icterus.  Neck- on Aspen collar, trach tube in place- site: dry and clean   Cardiovascular: regular rate and rhythm, no murmurs, rubs or gallops  Lungs: clear to auscultation bilaterally, without wheezing, rhonchi, or rales  Abdomen: soft, non-tender, non-distended; no palpable masses,  normoactive bowel sounds. PEG tube site- clean/dry/no leakage  Extremities: mild edema+ in both ankles, no cyanosis/clubbng  CNS- cranial nerves- grossly intact, 3+/5 in both upper extremities, 0/5 in both lower extremities  Skin- warm, no rashes         Meds:     Medications were  reviewed:    Labs:     Labs (last 72 hours):      Recent Labs  Lab 09/14/15  0036 09/13/15  0315   WBC 6.96 8.04   HGB 9.7* 9.4*   HEMATOCRIT 32.0* 31.2*   PLATELETS 190 178            Recent Labs  Lab 09/14/15  0036 09/13/15  1522   SODIUM 143 144   POTASSIUM 4.7 4.7   CHLORIDE 104 104   CO2 31* 33*   BUN 16.0 16.0   CREATININE 0.4* 0.4*   CALCIUM 8.0 7.9   GLUCOSE 110* 125*                   Microbiology, reviewed  Imaging, reviewed    Signed by: Lucillie Garfinkel, MD

## 2015-09-14 NOTE — Progress Notes (Signed)
PROGRESS NOTE    Date Time: 09/14/2015 1:13 PM  Patient Name: Ernest Diaz      Assessment:   1. Respiratory failure status post tracheostomy.  2. Recurrent mucus plugging with recent pneumonia.  3. 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  4. Pleural effusion.  5.  DVT or right LE.    Plan:   On PRVC this am. Will change to PSV and if tolerated T piece.  Requiring frequent deep suctioning for copious secretions.  Abx started last night- zosyn and vanco, will continue.  Decreasing fever. Reculture sputum showing Gram positive cocci.  Rest on vent tonight again.  Speech to eval for PMSV.  Cont Duonebs.  CTA neg for PE. Large bilateral effusions. Would hold off on tapping unless fever is persistent.  Diurese with lasix.  Repeat dopplers show thrombus in profunda femoral vein on right and superficial femoral vein on left. Eliquis started for cardiac reasons as well as DVT at 5mg  bid. OK by Neurosurgery.    Subjective:   Copious secretions with desaturation on repositioning.     Medications:     Current Facility-Administered Medications   Medication Dose Route Frequency   . albuterol-ipratropium  3 mL Nebulization Q6H SCH   . apixaban  5 mg Oral Q12H SCH   . balsam peru-castor oil (VENELEX)   Topical Q12H SCH   . cefTAZidime  1 g Intravenous Q8H SCH   . famotidine  20 mg Oral Q12H SCH    Or   . famotidine  20 mg Intravenous Q12H SCH   . fentaNYL  1 patch Transdermal Q72H   . furosemide  40 mg Intravenous Q12H   . gabapentin  200 mg per G tube QHS   . lactobacillus/streptococcus  2 capsule Oral Daily   . midodrine  10 mg Oral TID MEALS   . scopolamine  1 patch Transdermal Q72H   . terazosin  1 mg Oral QHS   . vancomycin  1,250 mg Intravenous Q12H       Review of Systems:   A comprehensive review of systems was: Negative    Physical Exam:     Filed Vitals:    09/14/15 1200   BP: 139/85   Pulse: 72   Temp:    Resp: 12   SpO2: 100%   99.1    Intake and Output Summary (Last 24 hours) at Date  Time    Intake/Output Summary (Last 24 hours) at 09/14/15 1313  Last data filed at 09/14/15 1200   Gross per 24 hour   Intake   1860 ml   Output   1160 ml   Net    700 ml       General appearance - acyanotic, in no respiratory distress  Mental status - drowsy  Eyes - pupils equal and reactive, extraocular eye movements intact  Ears - not examined  Nose - normal and patent, no erythema, discharge or polyps  Mouth - mucous membranes moist, pharynx normal without lesions  Neck - supple, no significant adenopathy and neck collar in place  Lymphatics - no palpable lymphadenopathy, no hepatosplenomegaly  Chest - rhonchi noted bilaterally  Heart - normal rate, regular rhythm, normal S1, S2, no murmurs, rubs, clicks or gallops  Abdomen - soft, nontender, nondistended, no masses or organomegaly  Extremities - peripheral pulses normal, no pedal edema, no clubbing or cyanosis    Labs:     Results     Procedure Component Value Units Date/Time  CULTURE BLOOD AEROBIC AND ANAEROBIC [[id number]] Collected:  09/13/15 0328    Specimen Information:  Blood, Venipuncture Updated:  09/14/15 0721    Narrative:      ORDER#: [id number]                                    ORDERED BY: NGUYEN, HUY  SOURCE: Blood, Venipuncture stick                    COLLECTED:  09/13/15 03:28  ANTIBIOTICS AT COLL.:                                RECEIVED :  09/13/15 06:22  Culture Blood Aerobic and Anaerobic        PRELIM      09/14/15 07:21  09/14/15   No Growth after 1 day/s of incubation.      Legionella antigen, urine [[id number]] Collected:  09/14/15 0326    Specimen Information:  Urine from Urine, Catheterized, Foley Updated:  09/14/15 0559    Narrative:      ORDER#: [id number]                                    ORDERED BY: NGUYEN, HUY  SOURCE: Urine, Catheterized, Foley                   COLLECTED:  09/14/15 03:26  ANTIBIOTICS AT COLL.:                                RECEIVED :  09/14/15 04:23  Legionella, Rapid Urinary Antigen          FINAL        09/14/15 05:58  09/14/15   Negative for Legionella pneumophila Serogroup 1 Antigen             Limitations of Test:             1. Negative results do not exclude infection with Legionella                pneumophila Serogroup 1.             2. Does not detect other serogroups of L. pneumophila                or other Legionella species.             Test Reference Range: Negative      GFR [[id number]] Collected:  09/14/15 0036     EGFR >60.0 Updated:  09/14/15 0132    Basic Metabolic Panel [[id number]]  (Abnormal) Collected:  09/14/15 0036    Specimen Information:  Blood Updated:  09/14/15 0132     Glucose 110 (H) mg/dL      BUN 42.5 mg/dL      Creatinine 0.4 (L) mg/dL      Calcium 8.0 mg/dL      Sodium 956 mEq/L      Potassium 4.7 mEq/L      Chloride 104 mEq/L      CO2 31 (H) mEq/L     CBC and differential [[id number]]  (Abnormal) Collected:  09/14/15 0036    Specimen Information:  Blood from Blood Updated:  09/14/15 0124  WBC 6.96 x10 3/uL      Hgb 9.7 (L) g/dL      Hematocrit 14.7 (L) %      Platelets 190 x10 3/uL      RBC 3.14 (L) x10 6/uL      MCV 101.9 (H) fL      MCH 30.9 pg      MCHC 30.3 (L) g/dL      RDW 15 %      MPV 11.1 fL      Neutrophils 76 %      Lymphocytes Automated 15 %      Monocytes 7 %      Eosinophils Automated 0 %      Basophils Automated 0 %      Immature Granulocyte 0 %      Nucleated RBC 0 /100 WBC      Neutrophils Absolute 5.32 x10 3/uL      Abs Lymph Automated 1.07 x10 3/uL      Abs Mono Automated 0.50 x10 3/uL      Abs Eos Automated 0.03 x10 3/uL      Absolute Baso Automated 0.01 x10 3/uL      Absolute Immature Granulocyte 0.03 x10 3/uL     Urine culture [[id number]] Collected:  09/13/15 1821    Specimen Information:  Urine from Urine, Catheterized, In & Out Updated:  09/13/15 2114    Urinalysis with microscopic [[id number]]  (Abnormal) Collected:  09/13/15 1821    Specimen Information:  Urine Updated:  09/13/15 1856     Urine Type Catheterized, I      Color, UA Amber (A)      Clarity, UA  Clear      Specific Gravity UA 1.034      Urine pH 6.0      Leukocyte Esterase, UA Trace (A)      Nitrite, UA Negative      Protein, UR 30 (A)      Glucose, UA Negative      Ketones UA Negative      Urobilinogen, UA 4.0 mg/dL      Bilirubin, UA Negative      Blood, UA Negative      RBC, UA 0 - 5 /hpf      WBC, UA 6 - 10 (A) /hpf      Squamous Epithelial Cells, Urine 0 - 5 /hpf      Urine Amorphous Rare /hpf      Urine Mucus Present     Basic metabolic panel [[id number]]  (Abnormal) Collected:  09/13/15 1522    Specimen Information:  Blood Updated:  09/13/15 1557     Glucose 125 (H) mg/dL      BUN 44.0 mg/dL      Creatinine 0.4 (L) mg/dL      Calcium 7.9 mg/dL      Sodium 102 mEq/L      Potassium 4.7 mEq/L      Chloride 104 mEq/L      CO2 33 (H) mEq/L     GFR [[id number]] Collected:  09/13/15 1522     EGFR >60.0 Updated:  09/13/15 1557    Troponin I [[id number]] Collected:  09/13/15 1442    Specimen Information:  Blood Updated:  09/13/15 1522     Troponin I 0.01 ng/mL     CULTURE + Dierdre Forth [[id number]] Collected:  09/13/15 1047    Specimen Information:  Sputum from Endotracheal Updated:  09/13/15 1436    Narrative:  ORDER#: [id number]                                    ORDERED BY: ABU-HAMDA, Darnisha Vernet  SOURCE: Endotracheal Aspirate lungs                  COLLECTED:  09/13/15 10:47  ANTIBIOTICS AT COLL.:                                RECEIVED :  09/13/15 11:56  Stain, Gram (Respiratory)                  FINAL       09/13/15 14:36  09/13/15   Many WBC's             Rare Squamous epithelial cells             Few Gram positive cocci  Culture and Gram Stain, Aerobic, RespiratorPENDING            Recent CBC   Recent Labs      09/14/15   0036   RBC  3.14*   HGB  9.7*   HEMATOCRIT  32.0*   MCV  101.9*   MCH  30.9   MCHC  30.3*   RDW  15   MPV  11.1     Recent BMP   Recent Labs      09/14/15   0036   GLUCOSE  110*   BUN  16.0   CREATININE  0.4*   CALCIUM  8.0   SODIUM  143   POTASSIUM  4.7   CHLORIDE  104    CO2  31*       Rads:   Radiological Procedure reviewed.    Signed by: Sherrie Mustache, MD

## 2015-09-14 NOTE — Progress Notes (Signed)
S:   CC: Denies neck pain    O:  No apparent distress  Pleasant  Rigid cervical collar in place  Motor: RUE -4/5, LUE 4/5 except handgrips R 3/5, L 1/5  No movement of BLE  +sensation BLE  Posterior neck incision staples intact, edges well approximated, incision healing well, mild ecchymosis R lateral neck    A/P  S/p C3-C5 and T3-T4 decompressive laminectomies for EDH evacuation on 08/10/15 by Dr. Jaynie Collins at [facility]  S/p C6-7 ACDF, C6-C7 posterior fusion with lateral mass scews on 08/20/15 by Dr. Jaynie Collins at [facility]  S/p C5-T1 posterior fusion, C5-C7 anterior fusion with partial C7 corpectomy on 08/29/15 by Dr. Claudette Laws      POD 16  Drowsy, easily arousable to voice  Able to mouth words  Trach to vent  Motor/sensory exam stable  Discontinue staples from posterior neck incision      Agree with discharge to acute rehab when medically stable for strength training and conditioning    Son, [place], at bedside

## 2015-09-14 NOTE — Progress Notes (Signed)
Nutrition Follow-Up:    A/D/I:    Recommend:   Continue current TF, plus Prosource 2 packets BID - 2320 kcals, 134 gm protein    Clinical Update:  Quad, resp failure s/p trach, aspiration PNA, UTI; s/p PEG   Labs: reviewed  Meds: pepcid, risaquad, vanco    Diet / Nutrition Support Order: 2 Cal HN @ 45 ml/hr, Prosource 2 packets   No residuals     M/E:  Monitor nutr support goals, GI, med tx plan     Georgina Pillion, RD; Philis Kendall [phone]

## 2015-09-14 NOTE — SLP Progress Note (Signed)
[facility]   Speech Therapy Cancellation Note      Patient:  Ernest Diaz MRN#:  [id number]  Unit:  [facility] TOWER 4 Room/Bed:  O536/U440.34    09/14/2015  Time:  1439      Patient not seen for speech therapy secondary to pt vent settings on PRVC and too high for PMSV trials. SLP will monitor vent weaning and proceed when appropriate. Goal setting is PS 12/5 or lower to proceed with PMSV trials. Thank you, Marcia Brash MS, CCC-SLP

## 2015-09-14 NOTE — Plan of Care (Signed)
Problem: Inadequate airway clearance  Goal: Patent Airway maintained  Outcome: Progressing  Alert and oriented x 3-4, mouths words appropriately.  Able to move BUE grossly.  Soft touch call bell in use.  Vented on PRVC 40%, desats with turns to the mid 80s, Dr Abu-Hamda aware.  Suctioned every 1-2 hours for thin, white secretions.  Neck remains edematous and reddened around trach site.  Aspen in place.  TwoCal at 45 cc, no residuals.  Zofran given once for c/o nausea this am.  Incontinent of one small brown BM.  Foley for retention with AUOP.  Lasix with good effects.  Family at beside throughout shift assisting with care.

## 2015-09-15 LAB — CBC AND DIFFERENTIAL
Basophils Absolute Automated: 0.02 10*3/uL (ref 0.00–0.20)
Basophils Automated: 0 %
Eosinophils Absolute Automated: 0.05 10*3/uL (ref 0.00–0.70)
Eosinophils Automated: 1 %
Hematocrit: 32.3 % — ABNORMAL LOW (ref 42.0–52.0)
Hgb: 10.1 g/dL — ABNORMAL LOW (ref 13.0–17.0)
Immature Granulocytes Absolute: 0.04 10*3/uL
Immature Granulocytes: 1 %
Lymphocytes Absolute Automated: 1.39 10*3/uL (ref 0.50–4.40)
Lymphocytes Automated: 18 %
MCH: 31.1 pg (ref 28.0–32.0)
MCHC: 31.3 g/dL — ABNORMAL LOW (ref 32.0–36.0)
MCV: 99.4 fL (ref 80.0–100.0)
MPV: 10.9 fL (ref 9.4–12.3)
Monocytes Absolute Automated: 0.55 10*3/uL (ref 0.00–1.20)
Monocytes: 7 %
Neutrophils Absolute: 5.48 10*3/uL (ref 1.80–8.10)
Neutrophils: 73 %
Nucleated RBC: 0 /100 WBC (ref 0–1)
Platelets: 199 10*3/uL (ref 140–400)
RBC: 3.25 10*6/uL — ABNORMAL LOW (ref 4.70–6.00)
RDW: 15 % (ref 12–15)
WBC: 7.53 10*3/uL (ref 3.50–10.80)

## 2015-09-15 LAB — VANCOMYCIN, TROUGH: Vancomycin Trough: 8.9 ug/mL — ABNORMAL LOW (ref 10.0–20.0)

## 2015-09-15 MED ORDER — FUROSEMIDE 10 MG/ML IJ SOLN
40.0000 mg | Freq: Two times a day (BID) | INTRAMUSCULAR | Status: AC
Start: 2015-09-15 — End: 2015-09-16
  Administered 2015-09-15 – 2015-09-16 (×2): 40 mg via INTRAVENOUS
  Filled 2015-09-15 (×2): qty 4

## 2015-09-15 NOTE — Progress Notes (Signed)
PROGRESS NOTE    Date Time: 09/15/2015 1:45 PM  Patient Name: [facility] III      Assessment:   1. Respiratory failure status post tracheostomy.  2. Recurrent mucus plugging with recent pneumonia.  3. 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  4. Pleural effusion.  5.  DVT or right LE.    Plan:   On PRVC this am. Will change to PSV and if tolerated T piece. D/W RT.  Less secretions  noted today.  Abx changed to ceftaz and vanco  Decreasing fever to 100. Reculture sputum still pending.  Rest on vent tonight again.  Speech to eval for PMSV.  Cont Duonebs.  CTA neg for PE. Large bilateral effusions. Would hold off on tapping unless fever is persistent. F/U CXR.  Diurese with lasix. If effusions remain large inspite of diuresis, would tap.  Repeat dopplers show thrombus in profunda femoral vein on right and superficial femoral vein on left. Eliquis started for cardiac reasons as well as DVT at 5mg  bid. OK by Neurosurgery.    Subjective:   Less secretions.     Medications:     Current Facility-Administered Medications   Medication Dose Route Frequency   . albuterol-ipratropium  3 mL Nebulization Q6H SCH   . apixaban  5 mg per NG tube Q12H [facility]    And   . dextrose  60 mL Nasogastric Q12H SCH   . balsam peru-castor oil (VENELEX)   Topical Q12H SCH   . cefTAZidime  1 g Intravenous Q8H SCH   . famotidine  20 mg Oral Q12H SCH    Or   . famotidine  20 mg Intravenous Q12H SCH   . fentaNYL  1 patch Transdermal Q72H   . gabapentin  200 mg per G tube QHS   . lactobacillus/streptococcus  2 capsule Oral Daily   . midodrine  10 mg Oral TID MEALS   . scopolamine  1 patch Transdermal Q72H   . terazosin  1 mg Oral QHS   . vancomycin  1,250 mg Intravenous Q12H       Review of Systems:   A comprehensive review of systems was: Negative    Physical Exam:     Filed Vitals:    09/15/15 1305   BP:    Pulse:    Temp:    Resp:    SpO2: 98%   127/65  87  100  18    Intake and Output Summary (Last 24 hours) at Date  Time    Intake/Output Summary (Last 24 hours) at 09/15/15 1345  Last data filed at 09/15/15 0800   Gross per 24 hour   Intake   2500 ml   Output   3215 ml   Net   -715 ml       General appearance - acyanotic, in no respiratory distress  Mental status - drowsy  Eyes - pupils equal and reactive, extraocular eye movements intact  Ears - not examined  Nose - normal and patent, no erythema, discharge or polyps  Mouth - mucous membranes moist, pharynx normal without lesions  Neck - supple, no significant adenopathy and neck collar in place  Lymphatics - no palpable lymphadenopathy, no hepatosplenomegaly  Chest - rhonchi noted bilaterally  Heart - normal rate, regular rhythm, normal S1, S2, no murmurs, rubs, clicks or gallops  Abdomen - soft, nontender, nondistended, no masses or organomegaly  Extremities - peripheral pulses normal, no pedal edema, no clubbing or cyanosis  Labs:     Results     Procedure Component Value Units Date/Time    CULTURE + Dierdre Forth [[id number]] Collected:  09/13/15 1047    Specimen Information:  Sputum from Endotracheal Updated:  09/15/15 1333    Narrative:      ORDER#: [id number]                                    ORDERED BY: Lemar Livings, Joron Velis  SOURCE: Endotracheal Aspirate lungs                  COLLECTED:  09/13/15 10:47  ANTIBIOTICS AT COLL.:                                RECEIVED :  09/13/15 11:56  Stain, Gram (Respiratory)                  FINAL       09/13/15 14:36  09/13/15   Many WBC's             Rare Squamous epithelial cells             Few Gram positive cocci  Culture and Gram Stain, Aerobic, RespiratorFINAL       09/15/15 13:33  09/14/15   Moderate growth of mixed upper respiratory flora      CULTURE BLOOD AEROBIC AND ANAEROBIC [[id number]] Collected:  09/13/15 0328    Specimen Information:  Blood, Venipuncture Updated:  09/15/15 0721    Narrative:      ORDER#: [id number]                                    ORDERED BY: NGUYEN, HUY  SOURCE: Blood, Venipuncture stick                     COLLECTED:  09/13/15 03:28  ANTIBIOTICS AT COLL.:                                RECEIVED :  09/13/15 06:22  Culture Blood Aerobic and Anaerobic        PRELIM      09/15/15 07:21  09/14/15   No Growth after 1 day/s of incubation.  09/15/15   No Growth after 2 day/s of incubation.      CBC and differential [[id number]]  (Abnormal) Collected:  09/15/15 0335    Specimen Information:  Blood from Blood Updated:  09/15/15 0518     WBC 7.53 x10 3/uL      Hgb 10.1 (L) g/dL      Hematocrit 35.5 (L) %      Platelets 199 x10 3/uL      RBC 3.25 (L) x10 6/uL      MCV 99.4 fL      MCH 31.1 pg      MCHC 31.3 (L) g/dL      RDW 15 %      MPV 10.9 fL      Neutrophils 73 %      Lymphocytes Automated 18 %      Monocytes 7 %      Eosinophils Automated 1 %      Basophils Automated 0 %  Immature Granulocyte 1 %      Nucleated RBC 0 /100 WBC      Neutrophils Absolute 5.48 x10 3/uL      Abs Lymph Automated 1.39 x10 3/uL      Abs Mono Automated 0.55 x10 3/uL      Abs Eos Automated 0.05 x10 3/uL      Absolute Baso Automated 0.02 x10 3/uL      Absolute Immature Granulocyte 0.04 x10 3/uL     AFB culture and smear [[id number]] Collected:  08/29/15 0200    Specimen Information:  Sputum from Wound Updated:  09/14/15 2007    Narrative:      ORDER#: [id number]                                    ORDERED BY: Nicoletta Dress  SOURCE: Wound POSTERIOR CERVICAL WOUND               COLLECTED:  08/29/15 02:00  ANTIBIOTICS AT COLL.:                                RECEIVED :  08/29/15 05:54  Stain, Acid Fast                           FINAL       08/29/15 11:42  08/29/15   No Acid Fast Bacillus Seen  Culture Acid Fast Bacillus (AFB)           PRELIM      09/14/15 20:02  09/07/15   No growth after 1 week/s of incubation.  09/14/15   No growth after 2 week/s of incubation.      Urine culture [[id number]] Collected:  09/13/15 1821    Specimen Information:  Urine from Urine, Catheterized, In & Out Updated:  09/14/15 1851    Narrative:      ORDER#:  [id number]                                    ORDERED BY: Cyndie Chime, HUY  SOURCE: Urine, Catheterized, In & Out                COLLECTED:  09/13/15 18:21  ANTIBIOTICS AT COLL.:                                RECEIVED :  09/13/15 21:14  Culture Urine                              FINAL       09/14/15 18:51  09/14/15   No growth of >1,000 CFU/ML, No further work            Recent CBC   Recent Labs      09/15/15   0335   RBC  3.25*   HGB  10.1*   HEMATOCRIT  32.3*   MCV  99.4   MCH  31.1   MCHC  31.3*   RDW  15   MPV  10.9     Recent BMP   No results for input(s): GLU, BUN, CREAT, CA, NA, K, CL, CO2 in the  last 24 hours.    Invalid input(s): AGAP    Rads:   Radiological Procedure reviewed.    Signed by: Sherrie Mustache, MD

## 2015-09-15 NOTE — UM Notes (Signed)
4/4    A.fib, HR 60-80's. SBP 100-170. Max temp 99.0 axillary    on PRVC throughout the night, increased from 40%-50%. No desaturations once switched to 50%. Tracheal suctioning Q 3 hrs. Redness and swelling around trach plate improved        Neuro checks Q 4 hrs    Plan:   On PRVC this am. Will change to PSV and if tolerated T piece.   Less secretions noted today.  Abx changed to ceftaz and vanco  Decreasing fever to 100. Reculture sputum still pending.  Rest on vent tonight again.  Speech to eval for PMSV.  Cont Duonebs.  CTA neg for PE. Large bilateral effusions. Would hold off on tapping unless fever is persistent. F/U CXR.  Diurese with lasix. If effusions remain large inspite of diuresis, would tap.  Repeat dopplers show thrombus in profunda femoral vein on right and superficial femoral vein on left. Eliquis started for cardiac reasons as well as DVT at 5mg  bid. OK by Neurosurgery.

## 2015-09-15 NOTE — Progress Notes (Signed)
ID PROGRESS NOTE    Date Time: 09/15/2015 12:58 PM  Patient Name: [facility]        Subjective, ROS:    In [facility]    Low grade fever, overall hemodynamically stable   On PS     Stable labs     Family at bedside    Has no major complaints  Wants to go home   Denies SOB  No N/V, tolerating TF  Foley in place        Antibiotics and culture results:   Antibiotics: ceftazidime and vancomycin #2  Midodrine   S/P vanc , zosyn       Cultures:  4/3 legionella antigen negative   4/2 Ucx Ngtd  4/2 sputum cx mod mixed upper flora   4/2 Blood cx Ngtd  3/27 BAL cx upper flora   3/22 sputum cx Stenotrophomonas maltophilia , mixed upper flora    3/20 flu negative   3/18 cervical wound cx cut flora  3/18 cervical wound anaerobic cx ngtd  3/18 cervical wound AFB and fungal cx Ngtd  3/13 Ucx >100,000 E. Faecalis   3/13 Blood cx Ngtd  3/6 MRSA negative     3/9 path FIBROVASCULAR TISSUE, FEW VESSELS AND BONE CHIPS       Lines: PIV access    Physical Exam:     Filed Vitals:    09/15/15 0949   BP:    Pulse:    Temp:    Resp:    SpO2: 98%       General: in bed, in [facility], awake and  communicative. vented , family  at bedside   HEENT: dry mucosa ,  pale, not icteric , cervical collar in place , trach in place   Chest: S1S2 irregular , coarse BS bilaterally,  no wheeze , BS at bases decreased.   Abdomen: soft, not tender , BS +, PEG in place   Ext: + edema   Weakness  , no movement in legs   Foley in place       Labs:       Recent CBC WITH DIFF   Recent Labs      09/15/15   0335   WBC  7.53   RBC  3.25*   HGB  10.1*   HEMATOCRIT  32.3*   MCV  99.4       Recent CMP   No results for input(s): GLU, BUN, CREAT, NA, K, CL, CO2 in the last 24 hours.        Rads:   4/2 CT No CT evidence of pulmonary embolism.Large bilateral pleural effusions with adjacent infiltrates worrisome  for pneumonia    4/2 doppler Thrombosis right profunda femoral vein. Thrombosis left greater saphenous vein    3/23 sono Similar findings as seen on recent  CT. Thick-walled bladder without significant distention with pericholecystic fluid. Lack of gallstones and clear focal tenderness makes cholecystitis less likely. The patient was uncooperative for this portable examination however. Differential considerations would include hypoalbuminemia, chronic cholecystitis or acute cholecystitis. If continued clinical concern can perform HIDA scan. Bilateral effusions. Patient refused renal evaluation.    3/22 CT  Decompressed stomach located within the anterior abdomen extending  below the left hepatic lobe margin and above the transverse colon.. Nonspecific gallbladder wall thickening with pericholecystic fluid and stranding in the presence of small ascites. Recommend clinical correlation for acute cholecystitis.. Bilateral small effusions and basilar lower lobe consolidative atelectasis. Cardiac enlargement.    3/20 IRSuccessful  deployment of a retrievable inferior vena cava filter    3/19 doppler Nonocclusive deep venous thrombosis involving a short segment of the proximal femoral vein.    3/17 CT  Markedly abnormal examination with anterolisthesis of C6 upon C7  measuring 16 mm with a jumped facet on the left and a comminuted displaced fracture involving the right C7 articular facet. Anterior cervical spinal fusion hardware is malpositioned with both plate and screws extending into the C6-C7 disc space.  The AP dimension of the bony spinal canal at the superior C5 level is reduced to 5 mm.       3/8 MRI Postsurgical change with prior laminectomy. 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 There findings suspicious for a disc ligamentous injury at this level. Correlation with CT is advised    3/7 IR No angiographic evidence of intracranial aneurysm, arteriovenous malformation or dural fistula. Is no angiographic evidence of spinal arteriovenous malformation or dural fistula.  20-30% stenosis of the cervical left internal carotid  arteries described.    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   - worsening of weakness due to Fracture dislocation C6-C7 with malpositioned hardware  - S/P C5-T1 posterior fusion, C5-C7 anterior fusion with partial C7 corpectomy on 3/18. cx normal flora     Other issues during admission:  Fever/SIRS on 3/12   Symptoms of aspiration with increased cough and mild-mod oropharyngeal dysphagia   Urinary retention, required reinsertion of foley and E. Faecalis UTI  Leukocytosis  DVT (S/P IVC filter)    Hypoxic respiratory failure overnight 3/21-3/22   Now with Near complete opacification of the left lung   Gallbladder wall thickening per imaging   DVT       Plan:   1. SIRS.   Was due to UTI and aspiration pneumonia.    Recent fever could be due to pneumonia/also DVT  - repeat Ucx and Blood cx negative  - sputum cx normal flora     On ceftazidime and vanc.   Still low grade fever, overall hemodynamically more stable.  Ok to stop vancomycin.      2. Recurrent Hypoxic respiratory failure and aspiration pneumonia .  Episode on 3/21-22 due to aspiration/malositioned Corpak (per xray not in the stomach), required BiPAP and NRB. (Sputum cx normal flora and Stenotrophomonas maltophilia)     Near complete opacification of the left lung on 3/27 xray , probably mucous plug after extubation.      S/P intubation and bronch on 3/27  . ' Copious thick white secretions were noted in the leftmainstem bronchus. Left airway mucosa was edematous and friable with some mucosal bleeding' . Sputum cx normal flora     S/P trach 3/28      S/P a course of levaquin     Last CT with large effusion and consolidation/   Repeat sputum cx normal flora.     On vanc and ceftazidime.  Ok to stop vancomycin.   Need for thoracentesis per pulmonary      3. E. Faecalis UTI.   Cath related  Foley was removed/then replaced x 2 given retention   Has finished the course. \  Repeat Ucx negative       4. 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   S/P C5-T1 posterior fusion, C5-C7 anterior fusion with partial C7 corpectomy on 3/18. cx normal flora     5. Leukocytosis.   Due to above.   Resolved.    6. Gallbladder wall thickening  No symptoms suggestive of cholecystitis for now     7. DVT   Per primary     8. Goals of care.   Palliative care on board     Discussed with the patient and  family      Signed by: [place]. [id number]  Infectious Disease  Phone: [phone]  Fax: [phone]

## 2015-09-15 NOTE — Progress Notes (Signed)
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 Jaynie Collins at [facility]   - s/p C6-7 ACDF, C6-C7 posterior fusion with lateral mass scews on 08/20/15 by Dr. Jaynie Collins  - s/p  C5-T1 posterior fusion, C5-C7 anterior fusion with partial C7 corpectomy on 08/29/15 by Dr. Claudette Laws  - NSGY following, appreciate recommendations   - Hard collar at all times secondary to neck weakness  - Continue Fentanyl patch for pain control, Gabapentin   - Palliative Care following for goals of care, code status is DNR/support ok  - Extensive discussion held with patient and family regarding poor prognosis    Hypoxic/hypercarbic respiratory failure s/p trach 3/28:   - Bronched 3/27 for left lung collapse from mucus plugging in the setting of stenotrophomonas growth in sputum culture   - Continues to require ventilator support, PS trial today  - Pulmonology following, appreciate recommendations    - On bronchodilators, mucomyst and receiving aggressive suctioning  - Continue Scopolamine patch for secretions   - CTA negative for PE, shows b/l pleural effusions   - Sputum culture shows many WBCs, GPC, continue Vancomycin and Ceftazidime at this time  - Blood cultures NGTD, ID following, appreciate recommendations     - Aggressive diuresis with Lasix 40 mg IV BID, tolerating diuresis with Midodrine for BP support     Hypotension - chronic, in the setting of dysautonomia  - Continue Midodrine. Off Florinef    AF: Rate controlled without medications     - Discussed with Neurosurgery Dr. 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 Was previously off Eliquis due to  recent spinal cord surgery  - Resumed Eliquis on 09/13/15, d/w family and Neurosurgery before starting Eliquis. Possible risks/complications given recent spinal surgery discussed with family who are in agreement with starting AC at this time      GI/DVT prophylaxis: on Pepcid; on Eliquis    Case discussed with: RN, ID, Pulmonology     Safety Checklist:     DVT prophylaxis:  CHEST guideline (See page [id number]) Eliquis    Foley:  Buras Rn Foley protocol Yes   IVs:  PIV   PT/OT: Yes   Daily CBC & or Chem ordered:  SHM/ABIM guidelines (see #5) Yes   Reference for approximate charges of common labs: CBC auto diff - $76  BMP - $99  Mg - $79    Lines:     Patient Lines/Drains/Airways Status    Active PICC Line / CVC Line / PIV Line / Drain / Airway / Intraosseous Line / Epidural Line / ART Line / Line / Wound / Pressure Ulcer / NG/OG Tube     Name:   Placement date:   Placement time:   Site:   Days:    Peripheral IV 08/29/15 Left Hand  08/29/15   1345   Hand   11    Peripheral IV 08/30/15 Right Antecubital  08/30/15   0030   Antecubital   10    Peripheral IV 09/08/15 Left Forearm  09/08/15   0400   Forearm  1    Peripheral IV 09/08/15 Right Hand  09/08/15   0400   Hand   1    Peripheral IV 09/08/15 Left Forearm  09/08/15   2200   Forearm   less than 1    Gastrostomy/Enterostomy Gastrostomy-jejunostomy  09/04/15         5    Urethral Catheter Temperature probe 16 Fr.  09/07/15   1300   Temperature probe   2    External Urinary Catheter  09/06/15   1230      3    Surgical Airway Shiley 6 mm  09/08/15   1110   6 mm   1    Wound 08/25/15 Pressure Injury Buttocks Left;Right Non-Blanchable Erythema  08/25/15   2258   Buttocks   14    Incision Site 08/20/15 Neck  08/20/15   1427     19    Incision Site 08/20/15 Neck  08/20/15   1932     19    Incision Site 08/28/15 Neck  08/28/15   2352     11    Incision Site 08/29/15 Neck  08/29/15   0515     11                 Disposition:     Today's date: 09/15/2015  Length of Stay:  29  Anticipated medical stability for discharge: 3- 4 days  Reason for ongoing hospitalization: quadriparesis  Anticipated discharge needs: TBD    Subjective     CC: Quadriplegia    Interval History/24 hour events: please see below    HPI/Subjective: Ernest Diaz is a 70 y.o. male hx afib on Eliquis + aspirin, HTN who presented to [facility] 2/25 after a fall. CT head and spine were negative so he was discharged. 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 MRI cervical and thoracic spine showed spinal epidural hematoma extending from the foramen magnum to the upper thoracic spine. Neurosurgery (Dr. Jaynie Collins) was consulted. His Eliquis was reversed. He ultimately had C3-C5 and T3-T4 decompressive laminectomies 2/27 by Dr. Jaynie Collins. He noted extensive dorsal epidural venous plexus and when he dissected out these vessels they bled. Dr. Jaynie Collins requested he be transferred here for spinal angiogram. These symptoms are sudden onset, severe intensity, without alleviating factors.     3/29. More stable at [facility] ICU neurologically, stable at trach collar, no fever/diarrhea. Tolerating tube feeding. Follows commands. Not in any distress.   3/30. More alert and awake, follows commands. No any acute events noted. Family at bed side.  3/31. Had desaturation intermittently since last night, had significant secretion in his trach suctioning. No fever. More alert and awake. Not in distress now, more comfort with PS. Has mild discomfort at trach site. No discharge from the trach site.  4/1. More alert, no any acute events noted. On PS mode. No fever, diarrhea and respiratory distress.  4/2. Has intermittent chest pain and SOB. Had low grade fever today. No cough, diarrhea, abdominal pain and chills. Not in distress. Follows commands  4/3. no any acute events noted. Still has been on PS, no fever today. More stable.    4/4: No acute events overnight.  FiO2 increased slightly to 50%.  Requests a shave today and  states pain at neck site is better today.  Denies other acute concerns/complaints at this time.      Physical Exam:     VITAL SIGNS PHYSICAL EXAM   Temp:  [  98 F (36.7 C)-100 F (37.8 C)] 100 F (37.8 C)  Heart Rate:  [67-87] 87  Resp Rate:  [12-24] 15  BP: (103-177)/(53-85) 127/65 mmHg  FiO2:  [40 %-100 %] 50 %  Blood Glucose:    Telemetry:       Intake/Output Summary (Last 24 hours) at 09/15/15 1144  Last data filed at 09/15/15 0800   Gross per 24 hour   Intake   2590 ml   Output   3425 ml   Net   -835 ml    Physical Exam  General: awake, alert X 3, not in distress  HEENT- NC/AT  Neck- wearing Aspen collar, trach tube in place- site: dry and clean   Cardiovascular: irregularly irregular, no murmurs, rubs or gallops  Lungs: coarse breath sounds and dull bilaterally, without wheezing or rales  Abdomen: soft, non-tender, non-distended; no palpable masses,  normoactive bowel sounds. PEG tube site- clean/dry/no leakage  Extremities: 1+ LE edema b/l   Neuro: Cranial nerves- grossly intact, 4/5 in both upper extremities, 0/5 in both lower extremities       Meds:     Medications were reviewed:    Labs:     Labs (last 72 hours):      Recent Labs  Lab 09/15/15  0335 09/14/15  0036   WBC 7.53 6.96   HGB 10.1* 9.7*   HEMATOCRIT 32.3* 32.0*   PLATELETS 199 190            Recent Labs  Lab 09/14/15  0036 09/13/15  1522   SODIUM 143 144   POTASSIUM 4.7 4.7   CHLORIDE 104 104   CO2 31* 33*   BUN 16.0 16.0   CREATININE 0.4* 0.4*   CALCIUM 8.0 7.9   GLUCOSE 110* 125*                   Microbiology, reviewed  Microbiology Results     Procedure Component Value Units Date/Time    AFB culture and smear [[id number]] Collected:  08/29/15 0200    Specimen Information:  Sputum from Wound Updated:  09/14/15 2007    Narrative:      ORDER#: [id number]                                    ORDERED BY: Nicoletta Dress  SOURCE: Wound POSTERIOR CERVICAL WOUND               COLLECTED:  08/29/15 02:00  ANTIBIOTICS AT COLL.:                                 RECEIVED :  08/29/15 05:54  Stain, Acid Fast                           FINAL       08/29/15 11:42  08/29/15   No Acid Fast Bacillus Seen  Culture Acid Fast Bacillus (AFB)           PRELIM      09/14/15 20:02  09/07/15   No growth after 1 week/s of incubation.  09/14/15   No growth after 2 week/s of incubation.      Anaerobic culture [[id number]] Collected:  08/29/15 0200    Specimen Information:  Other from Wound Updated:  09/02/15 0835    Narrative:  ORDER#: [id number]                                    ORDERED BY: Nicoletta Dress  SOURCE: Wound POSTERIOR CERVICAL WOUND               COLLECTED:  08/29/15 02:00  ANTIBIOTICS AT COLL.:                                RECEIVED :  08/29/15 05:54  Culture, Anaerobic Bacteria                FINAL       09/02/15 08:35  09/02/15   No anaerobic growth      CULTURE + Dierdre Forth [[id number]] Collected:  09/13/15 1047    Specimen Information:  Sputum from Endotracheal Updated:  09/14/15 1658    Narrative:      ORDER#: [id number]                                    ORDERED BY: Lemar Livings, EYAD  SOURCE: Endotracheal Aspirate lungs                  COLLECTED:  09/13/15 10:47  ANTIBIOTICS AT COLL.:                                RECEIVED :  09/13/15 11:56  Stain, Gram (Respiratory)                  FINAL       09/13/15 14:36  09/13/15   Many WBC's             Rare Squamous epithelial cells             Few Gram positive cocci  Culture and Gram Stain, Aerobic, RespiratorPRELIM      09/14/15 16:58  09/14/15   Moderate growth of mixed upper respiratory flora      CULTURE + Dierdre Forth [[id number]] Collected:  09/07/15 1240    Specimen Information:  Sputum from Bronchial Lavage Updated:  09/09/15 1324    Narrative:      ORDER#: [id number]                                    ORDERED BY: Jearld Shines, DAN  SOURCE: Bronchial Lavage bronchoscopy                COLLECTED:  09/07/15 12:40  ANTIBIOTICS AT COLL.:                                RECEIVED :  09/07/15  15:15  Stain, Gram (Respiratory)                  FINAL       09/07/15 17:13  09/07/15   Moderate WBC's             No Squamous epithelial cells             No organisms seen  Culture and Gram Stain, Aerobic, RespiratorFINAL       09/09/15  13:24  09/08/15   Light growth of mixed upper respiratory flora      CULTURE + Dierdre Forth [[id number]] Collected:  09/02/15 1452    Specimen Information:  Sputum from Sputum, Suctioned Updated:  09/04/15 1426    Narrative:      ORDER#: [id number]                                    ORDERED BY: Alysia Penna  SOURCE: Sputum, Suctioned sputum                     COLLECTED:  09/02/15 14:52  ANTIBIOTICS AT COLL.:                                RECEIVED :  09/02/15 18:47  Stain, Gram (Respiratory)                  FINAL       09/02/15 22:14  09/02/15   Few WBC's             No Epithelial cells             Few Mixed Respiratory Flora  Culture and Gram Stain, Aerobic, RespiratorFINAL       09/04/15 14:26   +  09/03/15   Moderate growth of mixed upper respiratory flora  09/03/15   Light growth of Stenotrophomonas maltophilia      _____________________________________________________________________________                                    S.malto       ANTIBIOTICS                     MIC  INTRP      _____________________________________________________________________________  Ceftazidime                      4     S        Levofloxacin                    <=1    S        Trimethoprim/Sulfamethoxazole <=0.5/9  S        _____________________________________________________________________________            S=SUSCEPTIBLE     I=INTERMEDIATE     R=RESISTANT                            N/S=NON-SUSCEPTIBLE  _____________________________________________________________________________      CULTURE BLOOD AEROBIC AND ANAEROBIC [[id number]] Collected:  09/13/15 0328    Specimen Information:  Blood, Venipuncture Updated:  09/15/15 0721    Narrative:      ORDER#: [id number]                                     ORDERED BY: NGUYEN, HUY  SOURCE: Blood, Venipuncture stick                    COLLECTED:  09/13/15 03:28  ANTIBIOTICS AT COLL.:  RECEIVED :  09/13/15 06:22  Culture Blood Aerobic and Anaerobic        PRELIM      09/15/15 07:21  09/14/15   No Growth after 1 day/s of incubation.  09/15/15   No Growth after 2 day/s of incubation.      CULTURE BLOOD AEROBIC AND ANAEROBIC [[id number]] Collected:  08/24/15 0909    Specimen Information:  Blood, Venipuncture Updated:  08/29/15 1521    Narrative:      ORDER#: [id number]                                    ORDERED BY: Wynetta Fines, VIN  SOURCE: Blood, Venipuncture peripheral               COLLECTED:  08/24/15 09:09  ANTIBIOTICS AT COLL.:                                RECEIVED :  08/24/15 13:17  Culture Blood Aerobic and Anaerobic        FINAL       08/29/15 15:21  08/29/15   No growth after 5 days of incubation.      Fungus culture [[id number]] Collected:  08/29/15 0200    Specimen Information:  Other from Wound Updated:  09/09/15 1529    Narrative:      ORDER#: [id number]                                    ORDERED BY: Nicoletta Dress  SOURCE: Wound POSTERIOR CERVICAL WOUND               COLLECTED:  08/29/15 02:00  ANTIBIOTICS AT COLL.:                                RECEIVED :  08/29/15 05:54  Stain, Fungal                              FINAL       08/29/15 12:01  08/29/15   No Fungal or Yeast Elements Seen  Culture Fungus                             PRELIM      09/09/15 15:28  09/09/15   No growth after 1 week/s of incubation.      Legionella antigen, urine [[id number]] Collected:  09/14/15 0326    Specimen Information:  Urine from Urine, Catheterized, Foley Updated:  09/14/15 0559    Narrative:      ORDER#: [id number]                                    ORDERED BY: NGUYEN, HUY  SOURCE: Urine, Catheterized, Foley                   COLLECTED:  09/14/15 03:26  ANTIBIOTICS AT COLL.:  RECEIVED :  09/14/15  04:23  Legionella, Rapid Urinary Antigen          FINAL       09/14/15 05:58  09/14/15   Negative for Legionella pneumophila Serogroup 1 Antigen             Limitations of Test:             1. Negative results do not exclude infection with Legionella                pneumophila Serogroup 1.             2. Does not detect other serogroups of L. pneumophila                or other Legionella species.             Test Reference Range: Negative      MRSA culture [[id number]] Collected:  08/17/15 2043    Specimen Information:  Body Fluid from Nasal/Throat ASC Admission Updated:  08/19/15 0328    Narrative:      ORDER#: [id number]                                    ORDERED BY: Paulita Fujita  SOURCE: Nares and Throat                             COLLECTED:  08/17/15 20:43  ANTIBIOTICS AT COLL.:                                RECEIVED :  08/18/15 03:03  Culture MRSA Surveillance                  FINAL       08/19/15 03:28  08/19/15   Negative for Methicillin Resistant Staph aureus from Nares and             Negative for Methicillin Resistant Staph aureus from Throat      Rapid influenza A/B antigens [[id number]] Collected:  08/31/15 1649    Specimen Information:  Nasopharyngeal from Nasal Aspirate Updated:  08/31/15 1727    Narrative:      ORDER#: [id number]                                    ORDERED BY: Sterling Big  SOURCE: Nasal Aspirate                               COLLECTED:  08/31/15 16:49  ANTIBIOTICS AT COLL.:                                RECEIVED :  08/31/15 17:04  Influenza Rapid Antigen A&B                FINAL       08/31/15 17:27  08/31/15   Negative for Influenza A and B             Reference Range: Negative      Urine culture [[id number]] Collected:  09/13/15 1821    Specimen Information:  Urine from Urine, Catheterized,  In & Out Updated:  09/14/15 1851    Narrative:      ORDER#: [id number]                                    ORDERED BY: Sheran Luz  SOURCE: Urine, Catheterized, In & Out                COLLECTED:   09/13/15 18:21  ANTIBIOTICS AT COLL.:                                RECEIVED :  09/13/15 21:14  Culture Urine                              FINAL       09/14/15 18:51  09/14/15   No growth of >1,000 CFU/ML, No further work      Urine culture [[id number]] Collected:  08/24/15 1718    Specimen Information:  Urine from Urine, Catheterized, Foley Updated:  08/26/15 1544    Narrative:      ORDER#: [id number]                                    ORDERED BY: Alysia Penna  SOURCE: Urine, Catheterized, Foley                   COLLECTED:  08/24/15 17:18  ANTIBIOTICS AT COLL.:                                RECEIVED :  08/24/15 23:42  Culture Urine                              FINAL       08/26/15 15:43   +  08/26/15   >100,000 CFU/ML Enterococcus faecalis      _____________________________________________________________________________                                   E.faecalis     ANTIBIOTICS                     MIC  INTRP      _____________________________________________________________________________  Ampicillin                       1     S        Gentamicin High Level [place]istan <=500   S        Levofloxacin                    <=1    S        Nitrofurantoin                 <=16    S  D1    Penicillin                       4     S        Streptomycin High Level Resist<=1000  S        Tetracycline                    >8     R        Vancomycin                       2     S          -----DRUG COMMENTS----------    D1:  Nitrofurantoin should only be used for the treatment of         uncomplicated cystitis.         [place] [facility] Ant[employer]icrobial Subcommittee June 2015  _____________________________________________________________________________            S=SUSCEPTIBLE     I=INTERMEDIATE     R=RESISTANT                            N/S=NON-SUSCEPTIBLE  _____________________________________________________________________________      Wound culture & gram stain [[id number]] Collected:  08/29/15 0200    Specimen Information:  Wound  from Wound Updated:  08/31/15 1209    Narrative:      ORDER#: [id number]                                    ORDERED BY: Nicoletta Dress  SOURCE: Wound POSTERIOR CERVICAL WOUND               COLLECTED:  08/29/15 02:00  ANTIBIOTICS AT COLL.:                                RECEIVED :  08/29/15 05:54  Stain, Gram                                FINAL       08/29/15 08:45  08/29/15   No Squamous epithelial cells seen             Rare WBCs             No organisms seen  Culture and Gram Stain, Aerobic, Wound     FINAL       08/31/15 12:09   +  08/31/15   Very light growth of mixed cutaneous flora            Imaging, reviewed  No results found.      Signed by: Isaias Cowman, MD

## 2015-09-15 NOTE — SLP Eval Note (Signed)
[facility]   Speech and Language Therapy Speaking Valve Evaluation     Patient: Ernest Diaz    MRN#: [id number]     Consult received for Ernest Diaz for SLP Speaking Valve Evaluation and Treatment.    Plan/Recommendations:   Speaking Valve Recommendations: PMSV trials 1-3x/day with RT or SLP only while on vent PS 12/5 or lower for 15 minutes as tolerated.   Speaking Valve Recommendations  Place valve for (minutes): 15  Place valve (number of times/day): 3  Wear Recommendations: patient must be awake;cuff must be delated for use;wear with supervision only;clinical swallowing evaluation  Recommended Supervision: RT  Goals Discussed with Patient/Caregiver: yes  Risks/Benefits Discussed with Patient/Caregiver: yes    SLP Frequency Recommended: 5x per week    Discharge recommendations: Acute Rehab    Assessment:   Speaking Valve Assessment: Pt is currently not a candidate for independent use of PMSV due to continued mechanical ventilation requirements however pt did tolerate PMSV inline on PS 10/5 for 15 minutes and was able to demonstrate strong phonation at the conversational level. Pt ultimately began to fatigue and was developing tracheal secretions and required additional suctioning upon restoration of original vent connections. Vitals were stable throughout. Recommend PMSV trials for 15 minutes 1-3x/day with SLP or RT only while requiring mechanical ventilation PS 12/5 or lower. Hope to liberalize recommendations once tolerating trach collar trials. Pt would also benefit from formal swallow evaluation and is eager to begin swallowing (please order). SLP will follow.   Speaking Valve Assessment  Prognosis: good      History of Present Illness:   Ernest Diaz is a 70 y.o. male admitted on 08/17/2015 with hx afib on Eliquis + aspirin, HTN who presented to [facility] 2/25 with cervicothoracic epidural hematoma s/p C3-C5 and T3-T4 decompressive laminectomies 2/27  (Dr. Jaynie Collins), C6-C7 ACDF 3/9 (Dr. Jaynie Collins), C5-C7 anterior fusion and C5-T1 posterior fusion for C6-C7 fracture dislocation 3/18 (Dr. Claudette Laws), right leg DVT 3/19 s/p IVCF now with acute respiratory failure. Went for G/J tube placement in IR and was intubated for procedure. Pt had developed swallowing difficulty and difficulty managing secretions prior to intubation. Failed to extubate and now s/p trach placement on 09/08/15. SLP consulted for PMSV trials while vent weaning.     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 Hypertension    . Arthritis    . Pericarditis 1975   . Atrial fibrillation    . Arrhythmia    . S/P IVC filter 08/31/2015     temporary         Subjective:   Patient is agreeable to participation in the therapy session. Nursing clears patient for therapy. Patient's medical condition is appropriate for Speech therapy intervention at this time.    PAIN:  denies    Objective:   Observation of Patient/Vital Signs:  Patient is in bed with dressings, telemetry and SCD's in place.    Patient left with call bell within reach, all needs met, SCDs in place, fall mat in place, bed alarm activated and all questions answered. RN notified of session outcome and patient response.     Respiratory Status:  Respiratory Status  Respiratory Status: mechanical ventilation    Tracheostomy:  Tracheostomy  Resp Rate: 18  SpO2: 98 %  FiO2: 50 %    Vent Settings:  PS 10/5    Cognition:  Oriented, follows commands  Fluent conversation    Oral Motor:  Dysphonia, reduced strength    Speaking Valve Oral Communication Trial:  Speaking Valve Oral Communication Trial  Respiratory Therapist: RT Thayer Ohm was present  Cuff Deflation: tolerated  Leak Speech: achieved  Manual Occlusion: audible phonation    Starting  Vital:  Starting Vital Signs  Valve Trial Start Time: 1100  Resp: 18  SpO2: 98 %    Ending Vitals:  Ending Vital Signs  Valve Trial Stop Time: 1115  Resp: 20  SpO2: 98 %  Duration (Calculated): 15 min    Educated the patient to role of speech therapy, plan of care, goals of therapy and recommendations.    Speaking Valve Goals:  Pt will tolerate PMSV with SLP x25-30 minutes without s/s of respiratory distress x1 session. NEW      Ernest Kloster Fredric Mare MS, CCC-SLP    Time of Treatment:  SLP Received On: 09/15/15  Start Time: 1050  Stop Time: 1120  Time Calculation (min): 30 min

## 2015-09-15 NOTE — PT Progress Note (Signed)
[facility]   Physical Therapy Treatment  Patient:  Ernest Diaz MRN#:  [id number]  Unit: [facility] TOWER 4  Bed: V409/W119.14    Discharge Recommendations:   D/C Recommendations: Acute Rehab (if off vent)  DME Recommendations: tbd    If Acute Rehab recommended discharge disposition is not available, patient will need max assist for mobility, hospital bed, hoyer, w/c equipment, and home PT.        Assessment:   Pt with good effort throughout Rx, able to perform B UE exercises in bed and transfer to EOB with max a. Min a needed to maintain EOB balance, addressed posture, balance and weight shifting. Pt remains on vent at this time but is very motivated and participates well in Rx, rec acute rehab if able to wean off of vent. Cont PT.     Treatment Activities: therapeutic exercise, neuro re-ed, tf and balance training    Educated the patient to role of physical therapy, plan of care, goals of therapy and safety with mobility and ADLs.    Plan:   PT Frequency: 3-4x/wk    Continue plan of care.       Precautions and Contraindications:   Falls, aspen, vent    Updated Medical Status/Imaging/Labs: tf to [facility]    Subjective:    "I'm hot"  Patient's medical condition is appropriate for Physical Therapy intervention at this time.  Patient is agreeable to participation in the therapy session.    Pain:   Scale: no c/o  Location: n/a  Intervention: n/a      Objective:   Patient is in bed with aspen, trach, vent, foley, PEG, IV telemetry in place.    Cognition/neuro exam  Alert and following directions  Mouthing words   No active LE movement noted    Functional Mobility  Rolling: max a  Supine to Sit: max a  Scooting: max a  Sit to Stand: nt  Stand to Sit: nt  Transfers: nt    [employer] operational officer Sitting: fair with anterior lean- improved with cuing for head position and use/positioning of UEs  Dynamic Sitting: fair-  Static Standing: nt  Dynamic Standing: nt    Therapeutic  Exercises  Supine- AROM B UEs, PROM B LEs    Patient Participation: good  Patient Endurance: fair    Patient left with call bell within reach, all needs met, SCDs not present, fall mat down and all questions answered. RN notified of session outcome and patient response. Prevalon boots placed.     Goals:  Goals  Goal Formulation: With patient/family  Time for Goal Acheivement: 7 visits  Goals: Select goal  Pt Will Go Supine To Sit: with minimal assist  Pt Will Perform Sit To Supine: with moderate assist  Pt Will Sit Edge of Bed: 11-15 min, with supervision  Pt Will Achieve Sitting Balance: 4/5 moves/returns trunkal midpoint 1-2 inches in multiple planes  Pt Will Perform Home Exer Program:  (family will be independent w/LE ROM)      Marylou Flesher, DPT    Time of Treatment:  PT Received On: 09/15/15  Start Time: 1125  Stop Time: 1155  Time Calculation (min): 30 min  Treatment # 1 out of 7 visits

## 2015-09-15 NOTE — Plan of Care (Signed)
N: Neuro checks Q 4 hrs. Patient more forgetful this evening, asking same questions multiple times within a short time period. No changes noted in MSK. Aspen collar remains in place  CV: A.fib, HR 60-80's. SBP 100-170. Max temp 99.0 axillary  P: Remained on PRVC throughout the night, increased from 40%-50%. No desaturations once switched to 50%. Tracheal suctioning Q 3 hrs. Redness and swelling around trach plate improved.   GI/GU: No BM. Foley catheter in place. AUOP.   Skin: Neck staples removed. Pressure ulcer noted under trach ties, wound care provided and silicone barrier applied. Wound care provided to bilateral buttocks and sacrum. Turned Q 2 hrs.     PRN morphine given x 2 for pain in neck with good effect    Emotional support was given and education with patient performed. Safety rounds assessed hourly and as needed for pain, positioning, bathroom, and environment.

## 2015-09-15 NOTE — Plan of Care (Signed)
Problem: Safety  Goal: Patient will be free from injury during hospitalization  Outcome: Progressing  Pt vss, trach mask today. No desats. Oral care and turns q2. AUOP via foley cath. No bm. Family at bedside. Hourly rounding done, will continue to monitor.

## 2015-09-16 ENCOUNTER — Inpatient Hospital Stay: Payer: Medicare Other

## 2015-09-16 DIAGNOSIS — Z7189 Other specified counseling: Secondary | ICD-10-CM | POA: Insufficient documentation

## 2015-09-16 DIAGNOSIS — R52 Pain, unspecified: Secondary | ICD-10-CM | POA: Insufficient documentation

## 2015-09-16 LAB — CBC AND DIFFERENTIAL
Basophils Absolute Automated: 0.01 10*3/uL (ref 0.00–0.20)
Basophils Automated: 0 %
Eosinophils Absolute Automated: 0.04 10*3/uL (ref 0.00–0.70)
Eosinophils Automated: 1 %
Hematocrit: 30.6 % — ABNORMAL LOW (ref 42.0–52.0)
Hgb: 9.4 g/dL — ABNORMAL LOW (ref 13.0–17.0)
Immature Granulocytes Absolute: 0.03 10*3/uL
Immature Granulocytes: 1 %
Lymphocytes Absolute Automated: 1.19 10*3/uL (ref 0.50–4.40)
Lymphocytes Automated: 19 %
MCH: 30.7 pg (ref 28.0–32.0)
MCHC: 30.7 g/dL — ABNORMAL LOW (ref 32.0–36.0)
MCV: 100 fL (ref 80.0–100.0)
MPV: 10.3 fL (ref 9.4–12.3)
Monocytes Absolute Automated: 0.54 10*3/uL (ref 0.00–1.20)
Monocytes: 8 %
Neutrophils Absolute: 4.51 10*3/uL (ref 1.80–8.10)
Neutrophils: 71 %
Nucleated RBC: 0 /100 WBC (ref 0–1)
Platelets: 202 10*3/uL (ref 140–400)
RBC: 3.06 10*6/uL — ABNORMAL LOW (ref 4.70–6.00)
RDW: 15 % (ref 12–15)
WBC: 6.32 10*3/uL (ref 3.50–10.80)

## 2015-09-16 LAB — BASIC METABOLIC PANEL
BUN: 16 mg/dL (ref 9.0–28.0)
CO2: 32 mEq/L — ABNORMAL HIGH (ref 22–29)
Calcium: 7.6 mg/dL — ABNORMAL LOW (ref 7.9–10.2)
Chloride: 101 mEq/L (ref 100–111)
Creatinine: 0.4 mg/dL — ABNORMAL LOW (ref 0.7–1.3)
Glucose: 93 mg/dL (ref 70–100)
Potassium: 4.3 mEq/L (ref 3.5–5.1)
Sodium: 141 mEq/L (ref 136–145)

## 2015-09-16 LAB — GFR: EGFR: >60

## 2015-09-16 MED ORDER — FUROSEMIDE 10 MG/ML IJ SOLN
40.0000 mg | Freq: Two times a day (BID) | INTRAMUSCULAR | Status: DC
Start: 2015-09-16 — End: 2015-09-17
  Administered 2015-09-16 – 2015-09-17 (×2): 40 mg via INTRAVENOUS
  Filled 2015-09-16 (×2): qty 4

## 2015-09-16 NOTE — Plan of Care (Signed)
Problem: Safety  Goal: Patient will be free from injury during hospitalization  Outcome: Progressing  No changes in neuro status overnight. Afebrile. VSS see flow sheet. Pt put PS @ 40% at night, no desats noted. AUOP via foley. No BM this shift. Safety maintained. RN will continue to monitor.

## 2015-09-16 NOTE — Progress Notes (Signed)
PROGRESS NOTE    Date Time: 09/16/2015 1:16 PM  Patient Name: [facility]      Assessment:   1. Respiratory failure status post tracheostomy.  2. Recurrent mucus plugging with recent pneumonia.  3. 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  4. Pleural effusion.  5.  DVT or right LE.    Plan:   On PSV this am. Will change to T piece as tolerated.  Moderate secretions  noted today.  Abx changed to ceftaz.  Decreasing fever to 100. Reculture sputum still pending.  Rest on vent tonight again.  Speech to eval for PMSV.  Able to tolerate but phonation suboptimal. Will likely need to downsize trach in next days.  Cont Duonebs.  CTA neg for PE. Large bilateral effusions. Would hold off on tapping unless fever is persistent. F/U CXR shows improved pleural effusion.  Diurese with lasix. If effusions remain large inspite of diuresis, would tap.  Repeat dopplers show thrombus in profunda femoral vein on right and superficial femoral vein on left. Eliquis started for cardiac reasons as well as DVT at 5mg  bid. OK by Neurosurgery.    Subjective:   Less secretions.     Medications:     Current Facility-Administered Medications   Medication Dose Route Frequency   . albuterol-ipratropium  3 mL Nebulization Q6H SCH   . apixaban  5 mg per NG tube Q12H [facility]    And   . dextrose  60 mL Nasogastric Q12H SCH   . balsam peru-castor oil (VENELEX)   Topical Q12H SCH   . cefTAZidime  1 g Intravenous Q8H SCH   . famotidine  20 mg Oral Q12H SCH    Or   . famotidine  20 mg Intravenous Q12H SCH   . fentaNYL  1 patch Transdermal Q72H   . furosemide  40 mg Intravenous BID   . gabapentin  200 mg per G tube QHS   . lactobacillus/streptococcus  2 capsule Oral Daily   . midodrine  10 mg Oral TID MEALS   . scopolamine  1 patch Transdermal Q72H   . terazosin  1 mg Oral QHS       Review of Systems:   A comprehensive review of systems was: Negative    Physical Exam:     Filed Vitals:    09/16/15 1030   BP:    Pulse:    Temp:     Resp:    SpO2: 90%   86/51  70  98.3  20    Intake and Output Summary (Last 24 hours) at Date Time    Intake/Output Summary (Last 24 hours) at 09/16/15 1316  Last data filed at 09/16/15 0600   Gross per 24 hour   Intake   1530 ml   Output   2775 ml   Net  -1245 ml       General appearance - acyanotic, in no respiratory distress  Mental status - drowsy  Eyes - pupils equal and reactive, extraocular eye movements intact  Ears - not examined  Nose - normal and patent, no erythema, discharge or polyps  Mouth - mucous membranes moist, pharynx normal without lesions  Neck - supple, no significant adenopathy and neck collar in place  Lymphatics - no palpable lymphadenopathy, no hepatosplenomegaly  Chest - rhonchi noted bilaterally  Heart - normal rate, regular rhythm, normal S1, S2, no murmurs, rubs, clicks or gallops  Abdomen - soft, nontender, nondistended, no masses or organomegaly  Extremities -  peripheral pulses normal, no pedal edema, no clubbing or cyanosis    Labs:     Results     Procedure Component Value Units Date/Time    CULTURE BLOOD AEROBIC AND ANAEROBIC [[id number]] Collected:  09/13/15 0328    Specimen Information:  Blood, Venipuncture Updated:  09/16/15 0721    Narrative:      ORDER#: [id number]                                    ORDERED BY: NGUYEN, HUY  SOURCE: Blood, Venipuncture stick                    COLLECTED:  09/13/15 03:28  ANTIBIOTICS AT COLL.:                                RECEIVED :  09/13/15 06:22  Culture Blood Aerobic and Anaerobic        PRELIM      09/16/15 07:21  09/14/15   No Growth after 1 day/s of incubation.  09/15/15   No Growth after 2 day/s of incubation.  09/16/15   No Growth after 3 day/s of incubation.      Basic Metabolic Panel [[id number]]  (Abnormal) Collected:  09/16/15 0154    Specimen Information:  Blood Updated:  09/16/15 0215     Glucose 93 mg/dL      BUN 08.6 mg/dL      Creatinine 0.4 (L) mg/dL      Calcium 7.6 (L) mg/dL      Sodium 578 mEq/L      Potassium 4.3 mEq/L       Chloride 101 mEq/L      CO2 32 (H) mEq/L     GFR [[id number]] Collected:  09/16/15 0154     EGFR >60.0 Updated:  09/16/15 0215    CBC and differential [[id number]]  (Abnormal) Collected:  09/16/15 0154    Specimen Information:  Blood from Blood Updated:  09/16/15 0205     WBC 6.32 x10 3/uL      Hgb 9.4 (L) g/dL      Hematocrit 27.2 (L) %      Platelets 202 x10 3/uL      RBC 3.06 (L) x10 6/uL      MCV 100.0 fL      MCH 30.7 pg      MCHC 30.7 (L) g/dL      RDW 15 %      MPV 10.3 fL      Neutrophils 71 %      Lymphocytes Automated 19 %      Monocytes 8 %      Eosinophils Automated 1 %      Basophils Automated 0 %      Immature Granulocyte 1 %      Nucleated RBC 0 /100 WBC      Neutrophils Absolute 4.51 x10 3/uL      Abs Lymph Automated 1.19 x10 3/uL      Abs Mono Automated 0.54 x10 3/uL      Abs Eos Automated 0.04 x10 3/uL      Absolute Baso Automated 0.01 x10 3/uL      Absolute Immature Granulocyte 0.03 x10 3/uL     Vancomycin, trough [[id number]]  (Abnormal) Collected:  09/15/15 1352    Specimen Information:  Blood Updated:  09/15/15  1500     Vancomycin Trough 8.9 (L) ug/mL      Vancomycin Time of Last Dose UNK      Vancomycin Date of Last Dose 09/15/2015     Narrative:      Check vancomycin trough before 4/4 afternoon dose of  vancomycin    CULTURE + Dierdre Forth [[id number]] Collected:  09/13/15 1047    Specimen Information:  Sputum from Endotracheal Updated:  09/15/15 1333    Narrative:      ORDER#: [id number]                                    ORDERED BY: Lemar Livings, Tarra Pence  SOURCE: Endotracheal Aspirate lungs                  COLLECTED:  09/13/15 10:47  ANTIBIOTICS AT COLL.:                                RECEIVED :  09/13/15 11:56  Stain, Gram (Respiratory)                  FINAL       09/13/15 14:36  09/13/15   Many WBC's             Rare Squamous epithelial cells             Few Gram positive cocci  Culture and Gram Stain, Aerobic, RespiratorFINAL       09/15/15 13:33  09/14/15   Moderate growth  of mixed upper respiratory flora            Recent CBC   Recent Labs      09/16/15   0154   RBC  3.06*   HGB  9.4*   HEMATOCRIT  30.6*   MCV  100.0   MCH  30.7   MCHC  30.7*   RDW  15   MPV  10.3     Recent BMP   Recent Labs      09/16/15   0154   GLUCOSE  93   BUN  16.0   CREATININE  0.4*   CALCIUM  7.6*   SODIUM  141   POTASSIUM  4.3   CHLORIDE  101   CO2  32*       Rads:   Radiological Procedure reviewed.    Signed by: Sherrie Mustache, MD

## 2015-09-16 NOTE — Progress Notes (Signed)
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 Dyspnea-chronic hypoxic/hypercarbic respiratory failure-s/ptrach- recurrent mucus plugging with recent PNA-continue gentle frequent suctioning as tolerated/indicated-son at bedside suctioning gently.   2. Dysphagia-s/p peg-tolerating tube feeds-per nutrition.   3. 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 Allow periods of rest. Fall precautions in place. Ambulate with assistance.   4. Anticipated disposition: Per primary team, discussed with case management   5. Goals of care- goals are very clear for patient and family (son and wife) they are hoping he be discharged to a acute rehab facility. Patient is comfortable and asymptomatic at this time. Case discussed with case management and Dr. Wynetta Fines. Advance directives booklet has been given to family to review and discuss. Palliative SW Lillia Abed has addressed questions regarding advance directives with family.   Palliative care will sign off at this time. Please re-consult if needed.     [name] D Joan Mayans, MSN, BSN, RN   Palliative Medicine   Spectra: [phone] M-F 9-4  Extend Pager: Palliative Medicine [facility] 24/7  Subjective:    Patient resting in bed, appears comfortable, patient is hard of hearing  Copious secretions-son suctioning at bedside  Patient denies any acute pain or concerns   Medications:     Current Facility-Administered Medications   Medication Dose Route Frequency   . albuterol-ipratropium  3 mL Nebulization Q6H SCH   . apixaban  5 mg per NG tube Q12H [facility]    And   . dextrose  60 mL Nasogastric Q12H SCH   .  balsam peru-castor oil (VENELEX)   Topical Q12H SCH   . cefTAZidime  1 g Intravenous Q8H SCH   . famotidine  20 mg Oral Q12H SCH    Or   . famotidine  20 mg Intravenous Q12H SCH   . fentaNYL  1 patch Transdermal Q72H   . furosemide  40 mg Intravenous BID   . gabapentin  200 mg per G tube QHS   . lactobacillus/streptococcus  2 capsule Oral Daily   . midodrine  10 mg Oral TID MEALS   . scopolamine  1 patch Transdermal Q72H   . terazosin  1 mg Oral QHS     Current Facility-Administered Medications   Medication Dose Route   . acetaminophen  325 mg Oral   . albuterol  2.5 mg Nebulization   . bisacodyl  10 mg Rectal   . dextrose  15 g of glucose Oral    And   . dextrose  25 mL Intravenous    And   . glucagon (rDNA)  1 mg Intramuscular   . diphenhydrAMINE  12.5 mg per G tube   . hydrALAZINE  10 mg Intravenous   . labetalol  10 mg Intravenous   . methocarbamol  750 mg Oral   . morphine  10 mg per G tube   . naloxone  0.2 mg Intravenous   . ondansetron  4 mg Intravenous   . phenol  1 spray Oral         Physical Exam:     Filed Vitals:    09/16/15 1030   BP:  Pulse:    Temp:    Resp:    SpO2: 90%     General: ill appearing, appears in no acute distress  ENT/Oral: mucous membranes dry, oropharynx clear without lesions or thrush,   CV: regular rate and rhythm  Lungs:rhonchi bilaterally   Abd: soft, non-tender, non-distended; normoactive bowel sounds  Ext: no clubbing, cyanosis, or edema  Labs:       Recent Labs  Lab 09/16/15  0154   WBC 6.32   HGB 9.4*   HEMATOCRIT 30.6*   PLATELETS 202         Recent Labs  Lab 09/16/15  0154   SODIUM 141   POTASSIUM 4.3   CHLORIDE 101   CO2 32*   BUN 16.0   CREATININE 0.4*   EGFR >60.0   GLUCOSE 93   CALCIUM 7.6*             Xr Chest Ap Portable    09/16/2015   Radiographic examination of the chest shows improving right basilar opacity.  There is no gross change in left basilar opacity. The left basilar opacity is likely due to a combination of factors including lung consolidation and pleural  effusion.                     Miguel Dibble, MD 09/16/2015 6:58 AM

## 2015-09-16 NOTE — Progress Notes (Signed)
S:   CC: Denies neck pain  POD 18  S/p C3-C5 and T3-T4 decompressive laminectomies for EDH evacuation on 08/10/15   S/p C6-7 ACDF, C6-C7 posterior fusion with lateral mass scews on 08/20/15   S/p C5-T1 posterior fusion, C5-C7 anterior fusion with partial C7 corpectomy on 08/29/15       O:No apparent distress  Pleasant  Awake, alert  Mouthing words  Rigid cervical collar in place  Motor: RUE -4/5, LUE 4/5 except handgrips R 3/5, L 1/5  No movement of BLE  +sensation BLE  Posterior neck incision staples intact, edges well approximated, incision healing well, mild ecchymosis R lateral neck    A/P  Neuro stable  Motor/sensory exam stable  Discontinue staples from posterior neck incision          Rella Larve, MD

## 2015-09-16 NOTE — Progress Notes (Signed)
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 Jaynie Collins at [facility]   - s/p C6-7 ACDF, C6-C7 posterior fusion with lateral mass scews on 08/20/15 by Dr. Jaynie Collins  - s/p  C5-T1 posterior fusion, C5-C7 anterior fusion with partial C7 corpectomy on 08/29/15 by Dr. Claudette Laws  - NSGY following, appreciate recommendations   - Hard collar at all times secondary to neck weakness  - Continue Fentanyl patch for pain control, Gabapentin   - Palliative Care following for goals of care, code status is DNR/support ok  - Extensive discussion held with patient and family regarding poor prognosis    Hypoxic/hypercarbic respiratory failure s/p trach 3/28:   - Bronched 3/27 for left lung collapse from mucus plugging in the setting of stenotrophomonas growth in sputum culture   - Continues to require ventilator support at night, PS trials during day   - Pulmonology following, appreciate recommendations    - On bronchodilators, mucomyst and receiving aggressive suctioning  - Continue Scopolamine patch for secretions   - CTA negative for PE, shows b/l pleural effusions  - Per Pulmonology no indication for thoracentesis at this time, continue diuresis for pleural effusions    - Sputum culture shows many WBCs, GPC, continue Ceftazidime at this time, Vancomycin discontinued per ID recommendations   - Blood cultures NGTD, ID following, appreciate recommendations     - Aggressive diuresis with Lasix 40 mg IV BID, tolerating diuresis with Midodrine for BP support   - SLP consulted for PMSV, has weak phonation, plan to downsize trach in next few days per Pulmonology     Hypotension - chronic, in the setting of dysautonomia  - Continue Midodrine  - Not requiring Florinef at this time     AF:  Rate controlled without medications     - Discussed with Neurosurgery Dr. 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 Was previously off Eliquis due to recent spinal cord surgery  - Resumed Eliquis on 09/13/15, d/w family and Neurosurgery before starting Eliquis. Possible risks/complications given recent spinal surgery discussed with family who are in agreement with starting AC at this time      GI/DVT prophylaxis: on Pepcid; on Eliquis    Case discussed with: RN, ID, Pulmonology     Safety Checklist:     DVT prophylaxis:  CHEST guideline (See page [id number]) Eliquis    Foley:  [place] [facility] Foley protocol Yes   IVs:  PIV   PT/OT: Yes   Daily CBC & or Chem ordered:  SHM/ABIM guidelines (see #5) Yes   Reference for approximate charges of common labs: CBC auto diff - $76  BMP - $99  Mg - $79    Lines:     Patient Lines/Drains/Airways Status    Active PICC Line / CVC Line / PIV Line / Drain / Airway / Intraosseous Line / Epidural Line / ART Line / Line / Wound / Pressure Ulcer / NG/OG Tube     Name:   Placement date:   Placement time:   Site:   Days:    Peripheral IV 08/29/15 Left Hand  08/29/15   1345   Hand   11    Peripheral IV 08/30/15 Right Antecubital  08/30/15   0030   Antecubital   10    Peripheral IV 09/08/15 Left Forearm  09/08/15   0400   Forearm   1    Peripheral IV 09/08/15 Right Hand  09/08/15   0400   Hand   1    Peripheral IV 09/08/15 Left Forearm  09/08/15   2200   Forearm   less than 1    Gastrostomy/Enterostomy Gastrostomy-jejunostomy  09/04/15         5    Urethral Catheter Temperature probe 16 Fr.  09/07/15   1300   Temperature probe   2    External Urinary Catheter  09/06/15   1230      3    Surgical Airway Shiley 6 mm  09/08/15   1110   6 mm   1    Wound 08/25/15 Pressure Injury Buttocks Left;Right Non-Blanchable Erythema  08/25/15   2258   Buttocks   14    Incision Site 08/20/15  Neck  08/20/15   1427     19    Incision Site 08/20/15 Neck  08/20/15   1932     19    Incision Site 08/28/15 Neck  08/28/15   2352     11    Incision Site 08/29/15 Neck  08/29/15   0515     11                 Disposition:     Today's date: 09/16/2015  Length of Stay: 30  Anticipated medical stability for discharge: 3- 4 days  Reason for ongoing hospitalization: quadriparesis  Anticipated discharge needs: TBD    Subjective     CC: Quadriplegia    Interval History/24 hour events: please see below    HPI/Subjective: Ernest Diaz is a 70 y.o. male hx afib on Eliquis + aspirin, HTN who presented to [facility] 2/25 after a fall. CT head and spine were negative so he was discharged. 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 MRI cervical and thoracic spine showed spinal epidural hematoma extending from the foramen magnum to the upper thoracic spine. Neurosurgery (Dr. Jaynie Collins) was consulted. His Eliquis was reversed. He ultimately had C3-C5 and T3-T4 decompressive laminectomies 2/27 by Dr. Jaynie Collins. He noted extensive dorsal epidural venous plexus and when he dissected out these vessels they bled. Dr. Jaynie Collins requested he be transferred here for spinal angiogram. These symptoms are sudden onset, severe intensity, without alleviating factors.     3/29. More stable at [facility] ICU neurologically, stable at trach collar, no fever/diarrhea. Tolerating tube feeding. Follows commands. Not in any distress.   3/30. More alert and awake, follows commands. No any acute events noted. Family at bed side.  3/31. Had desaturation intermittently since last night, had significant secretion in his trach suctioning. No fever. More alert and awake. Not in distress now, more comfort with PS. Has mild discomfort at trach site. No discharge from the trach site.  4/1. More alert, no any acute events noted. On PS mode. No fever, diarrhea and respiratory distress.  4/2. Has intermittent chest pain and SOB. Had low grade fever  today. No cough, diarrhea, abdominal pain and chills. Not in distress. Follows commands  4/3. no any acute events noted. Still has been on PS, no fever today. More stable.    4/4: No acute events  overnight.  FiO2 increased slightly to 50%.  Requests a shave today and states pain at neck site is better today.  Denies other acute concerns/complaints at this time.    4/5: No acute events overnight.  Denies chest pain, shortness of breath, abdominal pain at this time.  Still with weak phonation.  Secretions more improved today.  Frustrated with weak phonation and inability to communicate.  Denies other acute concerns/complaints at this time.        Physical Exam:     VITAL SIGNS PHYSICAL EXAM   Temp:  [98.3 F (36.8 C)-99 F (37.2 C)] 98.3 F (36.8 C)  Heart Rate:  [67-79] 70  Resp Rate:  [12-24] 20  BP: (86-111)/(51-65) 86/51 mmHg  FiO2:  [40 %-50 %] 40 %    Intake/Output Summary (Last 24 hours) at 09/16/15 1428  Last data filed at 09/16/15 0600   Gross per 24 hour   Intake   1440 ml   Output   2425 ml   Net   -985 ml    Physical Exam  General: awake, alert X 3, not in distress  HEENT- NC/AT  Neck- wearing Aspen collar, trach in place- site: dry and clean   Cardiovascular: irregularly irregular, no murmurs, rubs or gallops  Lungs: coarse breath sounds and dull bilaterally, without wheezing or rales  Abdomen: soft, non-tender, non-distended; no palpable masses,  normoactive bowel sounds. PEG tube site- clean/dry/no leakage  Extremities: 1+ LE edema b/l   Neuro: Cranial nerves- grossly intact, 4/5 in both upper extremities, 0/5 in both lower extremities       Meds:     Medications were reviewed:    Labs:     Labs (last 72 hours):      Recent Labs  Lab 09/16/15  0154 09/15/15  0335   WBC 6.32 7.53   HGB 9.4* 10.1*   HEMATOCRIT 30.6* 32.3*   PLATELETS 202 199            Recent Labs  Lab 09/16/15  0154 09/14/15  0036   SODIUM 141 143   POTASSIUM 4.3 4.7   CHLORIDE 101 104   CO2 32* 31*   BUN 16.0 16.0   CREATININE 0.4*  0.4*   CALCIUM 7.6* 8.0   GLUCOSE 93 110*                   Microbiology, reviewed  Microbiology Results     Procedure Component Value Units Date/Time    AFB culture and smear [[id number]] Collected:  08/29/15 0200    Specimen Information:  Sputum from Wound Updated:  09/14/15 2007    Narrative:      ORDER#: [id number]                                    ORDERED BY: Nicoletta Dress  SOURCE: Wound POSTERIOR CERVICAL WOUND               COLLECTED:  08/29/15 02:00  ANTIBIOTICS AT COLL.:                                RECEIVED :  08/29/15 05:54  Stain, Acid Fast                           FINAL  08/29/15 11:42  08/29/15   No Acid Fast Bacillus Seen  Culture Acid Fast Bacillus (AFB)           PRELIM      09/14/15 20:02  09/07/15   No growth after 1 week/s of incubation.  09/14/15   No growth after 2 week/s of incubation.      Anaerobic culture [[id number]] Collected:  08/29/15 0200    Specimen Information:  Other from Wound Updated:  09/02/15 0835    Narrative:      ORDER#: [id number]                                    ORDERED BY: Nicoletta Dress  SOURCE: Wound POSTERIOR CERVICAL WOUND               COLLECTED:  08/29/15 02:00  ANTIBIOTICS AT COLL.:                                RECEIVED :  08/29/15 05:54  Culture, Anaerobic Bacteria                FINAL       09/02/15 08:35  09/02/15   No anaerobic growth      CULTURE + Dierdre Forth [[id number]] Collected:  09/13/15 1047    Specimen Information:  Sputum from Endotracheal Updated:  09/14/15 1658    Narrative:      ORDER#: [id number]                                    ORDERED BY: Lemar Livings, EYAD  SOURCE: Endotracheal Aspirate lungs                  COLLECTED:  09/13/15 10:47  ANTIBIOTICS AT COLL.:                                RECEIVED :  09/13/15 11:56  Stain, Gram (Respiratory)                  FINAL       09/13/15 14:36  09/13/15   Many WBC's             Rare Squamous epithelial cells             Few Gram positive cocci  Culture and Gram Stain, Aerobic,  RespiratorPRELIM      09/14/15 16:58  09/14/15   Moderate growth of mixed upper respiratory flora      CULTURE + Dierdre Forth [[id number]] Collected:  09/07/15 1240    Specimen Information:  Sputum from Bronchial Lavage Updated:  09/09/15 1324    Narrative:      ORDER#: [id number]                                    ORDERED BY: Jearld Shines, DAN  SOURCE: Bronchial Lavage bronchoscopy                COLLECTED:  09/07/15 12:40  ANTIBIOTICS AT COLL.:  RECEIVED :  09/07/15 15:15  Stain, Gram (Respiratory)                  FINAL       09/07/15 17:13  09/07/15   Moderate WBC's             No Squamous epithelial cells             No organisms seen  Culture and Gram Stain, Aerobic, RespiratorFINAL       09/09/15 13:24  09/08/15   Light growth of mixed upper respiratory flora      CULTURE + Dierdre Forth [[id number]] Collected:  09/02/15 1452    Specimen Information:  Sputum from Sputum, Suctioned Updated:  09/04/15 1426    Narrative:      ORDER#: [id number]                                    ORDERED BY: Alysia Penna  SOURCE: Sputum, Suctioned sputum                     COLLECTED:  09/02/15 14:52  ANTIBIOTICS AT COLL.:                                RECEIVED :  09/02/15 18:47  Stain, Gram (Respiratory)                  FINAL       09/02/15 22:14  09/02/15   Few WBC's             No Epithelial cells             Few Mixed Respiratory Flora  Culture and Gram Stain, Aerobic, RespiratorFINAL       09/04/15 14:26   +  09/03/15   Moderate growth of mixed upper respiratory flora  09/03/15   Light growth of Stenotrophomonas maltophilia      _____________________________________________________________________________                                    S.malto       ANTIBIOTICS                     MIC  INTRP      _____________________________________________________________________________  Ceftazidime                      4     S        Levofloxacin                    <=1    S         Trimethoprim/Sulfamethoxazole <=0.5/9  S        _____________________________________________________________________________            S=SUSCEPTIBLE     I=INTERMEDIATE     R=RESISTANT                            N/S=NON-SUSCEPTIBLE  _____________________________________________________________________________      CULTURE BLOOD AEROBIC AND ANAEROBIC [[id number]] Collected:  09/13/15 0328    Specimen Information:  Blood, Venipuncture Updated:  09/15/15 0721    Narrative:      ORDER#: [id number]  ORDERED BY: NGUYEN, HUY  SOURCE: Blood, Venipuncture stick                    COLLECTED:  09/13/15 03:28  ANTIBIOTICS AT COLL.:                                RECEIVED :  09/13/15 06:22  Culture Blood Aerobic and Anaerobic        PRELIM      09/15/15 07:21  09/14/15   No Growth after 1 day/s of incubation.  09/15/15   No Growth after 2 day/s of incubation.      CULTURE BLOOD AEROBIC AND ANAEROBIC [[id number]] Collected:  08/24/15 0909    Specimen Information:  Blood, Venipuncture Updated:  08/29/15 1521    Narrative:      ORDER#: [id number]                                    ORDERED BY: Wynetta Fines, VIN  SOURCE: Blood, Venipuncture peripheral               COLLECTED:  08/24/15 09:09  ANTIBIOTICS AT COLL.:                                RECEIVED :  08/24/15 13:17  Culture Blood Aerobic and Anaerobic        FINAL       08/29/15 15:21  08/29/15   No growth after 5 days of incubation.      Fungus culture [[id number]] Collected:  08/29/15 0200    Specimen Information:  Other from Wound Updated:  09/09/15 1529    Narrative:      ORDER#: [id number]                                    ORDERED BY: Nicoletta Dress  SOURCE: Wound POSTERIOR CERVICAL WOUND               COLLECTED:  08/29/15 02:00  ANTIBIOTICS AT COLL.:                                RECEIVED :  08/29/15 05:54  Stain, Fungal                              FINAL       08/29/15 12:01  08/29/15   No Fungal or Yeast Elements Seen  Culture Fungus                              PRELIM      09/09/15 15:28  09/09/15   No growth after 1 week/s of incubation.      Legionella antigen, urine [[id number]] Collected:  09/14/15 0326    Specimen Information:  Urine from Urine, Catheterized, Foley Updated:  09/14/15 0559    Narrative:      ORDER#: [id number]  ORDERED BY: NGUYEN, HUY  SOURCE: Urine, Catheterized, Foley                   COLLECTED:  09/14/15 03:26  ANTIBIOTICS AT COLL.:                                RECEIVED :  09/14/15 04:23  Legionella, Rapid Urinary Antigen          FINAL       09/14/15 05:58  09/14/15   Negative for Legionella pneumophila Serogroup 1 Antigen             Limitations of Test:             1. Negative results do not exclude infection with Legionella                pneumophila Serogroup 1.             2. Does not detect other serogroups of L. pneumophila                or other Legionella species.             Test Reference Range: Negative      MRSA culture [[id number]] Collected:  08/17/15 2043    Specimen Information:  Body Fluid from Nasal/Throat ASC Admission Updated:  08/19/15 0328    Narrative:      ORDER#: [id number]                                    ORDERED BY: Paulita Fujita  SOURCE: Nares and Throat                             COLLECTED:  08/17/15 20:43  ANTIBIOTICS AT COLL.:                                RECEIVED :  08/18/15 03:03  Culture MRSA Surveillance                  FINAL       08/19/15 03:28  08/19/15   Negative for Methicillin Resistant Staph aureus from Nares and             Negative for Methicillin Resistant Staph aureus from Throat      Rapid influenza A/B antigens [[id number]] Collected:  08/31/15 1649    Specimen Information:  Nasopharyngeal from Nasal Aspirate Updated:  08/31/15 1727    Narrative:      ORDER#: [id number]                                    ORDERED BY: Sterling Big  SOURCE: Nasal Aspirate                               COLLECTED:  08/31/15 16:49  ANTIBIOTICS AT COLL.:                                 RECEIVED :  08/31/15 17:04  Influenza Rapid Antigen A&B  FINAL       08/31/15 17:27  08/31/15   Negative for Influenza A and B             Reference Range: Negative      Urine culture [[id number]] Collected:  09/13/15 1821    Specimen Information:  Urine from Urine, Catheterized, In & Out Updated:  09/14/15 1851    Narrative:      ORDER#: [id number]                                    ORDERED BY: Cyndie Chime, HUY  SOURCE: Urine, Catheterized, In & Out                COLLECTED:  09/13/15 18:21  ANTIBIOTICS AT COLL.:                                RECEIVED :  09/13/15 21:14  Culture Urine                              FINAL       09/14/15 18:51  09/14/15   No growth of >1,000 CFU/ML, No further work      Urine culture [[id number]] Collected:  08/24/15 1718    Specimen Information:  Urine from Urine, Catheterized, Foley Updated:  08/26/15 1544    Narrative:      ORDER#: [id number]                                    ORDERED BY: Alysia Penna  SOURCE: Urine, Catheterized, Foley                   COLLECTED:  08/24/15 17:18  ANTIBIOTICS AT COLL.:                                RECEIVED :  08/24/15 23:42  Culture Urine                              FINAL       08/26/15 15:43   +  08/26/15   >100,000 CFU/ML Enterococcus faecalis      _____________________________________________________________________________                                   E.faecalis     ANTIBIOTICS                     MIC  INTRP      _____________________________________________________________________________  Ampicillin                       1     S        Gentamicin High Level Resistan <=500   S        Levofloxacin                    <=1    S        Nitrofurantoin                 <=  16    S  D1    Penicillin                       4     S        Streptomycin High Level Resist<=1000   S        Tetracycline                    >8     R        Vancomycin                       2     S          -----DRUG COMMENTS----------    D1:   Nitrofurantoin should only be used for the treatment of         uncomplicated cystitis.         [facility] [employer]committee June 2015  _____________________________________________________________________________            S=SUSCEPTIBLE     I=INTERMEDIATE     R=RESISTANT                            N/S=NON-SUSCEPTIBLE  _____________________________________________________________________________      Wound culture & gram stain [[id number]] Collected:  08/29/15 0200    Specimen Information:  Wound from Wound Updated:  08/31/15 1209    Narrative:      ORDER#: [id number]                                    ORDERED BY: Nicoletta Dress  SOURCE: Wound POSTERIOR CERVICAL WOUND               COLLECTED:  08/29/15 02:00  ANTIBIOTICS AT COLL.:                                RECEIVED :  08/29/15 05:54  Stain, Gram                                FINAL       08/29/15 08:45  08/29/15   No Squamous epithelial cells seen             Rare WBCs             No organisms seen  Culture and Gram Stain, Aerobic, Wound     FINAL       08/31/15 12:09   +  08/31/15   Very light growth of mixed cutaneous flora            Imaging, reviewed  Xr Chest Ap Portable    09/16/2015   Radiographic examination of the chest shows improving right basilar opacity.  There is no gross change in left basilar opacity. The left basilar opacity is likely due to a combination of factors including lung consolidation and pleural effusion.                     Miguel Dibble, MD 09/16/2015 6:58 AM         Signed by: Isaias Cowman, MD

## 2015-09-16 NOTE — Progress Notes (Signed)
Pt vss, AUOP via foley cath. Wound care done with wound nurse. Attempted speaking valve with md abu-hamda with little success. Attempted again later in the shift after multiple suctioning attempts. Pt able to tolerate speaking valve >45 mins with sats 98-100/ turns and oral care provided q2. Will continue to monitor

## 2015-09-16 NOTE — Consults (Signed)
Initial WOC Nurse Ostomy Progress Note        Ernest Diaz is a 70 y.o. male admitted with Quadriplegia.        LOS:  LOS: 30 days   POD: 12 Days Post-Op    PMH:   Past Medical History   Diagnosis Date   . Hypertension    . Arthritis    . Pericarditis 1975   . Atrial fibrillation    . Arrhythmia    . 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 Vein surgery     . Colonoscopy       x2   . 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 Eye surgery  1960     Rt eye removed   . Decompression, anterior cervical, fusion, levels 2 N/A 08/20/2015     Procedure: DECOMPRESSION, ANTERIOR CERVICAL, FUSION, LEVELS 2;  Surgeon: Madaline Savage, MD;  Location: [facility];  Service: Neurosurgery;  Laterality: N/A;  C5-C7 ACDF; AVM REMOVAL   . Decompression, posterior cervical, fusion, levels 2 N/A 08/20/2015     Procedure: DECOMPRESSION, POSTERIOR CERVICAL, FUSION, LEVELS 2;  Surgeon: Madaline Savage, MD;  Location: [facility];  Service: Neurosurgery;  Laterality: N/A;   . Ivc filter placement N/A 08/31/2015     Procedure: IVC FILTER PLACEMENT;  Surgeon: Leeroy Bock, MD;  Location: [facility];  Service: Interventional Radiology;  Laterality: N/A;   . Fusion, anterior cervical, level 1 N/A 08/28/2015     Procedure: RE-DO ANTERIOR CERVICAL FUSION C6-7;  Surgeon: Brunetta Genera, MD;  Location: [facility];  Service: Neurosurgery;  Laterality: N/A;  RE-DO ANTERIOR CERVICAL FUSION C6-7  REMOVAL OF ANTERIOR HARDWARE, C6-C7; POSTERIOR CERVICAL DECOMPRESSION, REDUCTION OF FRACTURE, FUSION C5-T1 WITH INSTRUMENTATION; ANTERIOR CERVICAL CORPECTOMY AND FUSION C5-C7 WITH INSTRUMENTATION.       Braden Score: Braden Scale Score: 14 (09/16/15 0200)  Prealbumin: No components found for: PREALBUMIN    Specialty Bed:      Immerse       Reason for Visit:   Wound on neck and trach site                                                     Assessment / Intervention:  Trach site intact except sutures area beginning to pull , RN to ask surgeon when sutures can be removed. Head examined and intact skin. Superior aspect of the posterior neck incision area of slough 1.5 x 0.5 this is not from pressure. Discussed with RN to contact [employer].   Stage 2 on R it 1.5 x 2.5 red shallow wound.   Sacral and to Left of sacrum 5 x 4 cm which has several areas of slough and shallow red rest. Consistent with Unstageable PI.  Just to R of sacrum 0.5 stage 2 PI red shallow base. Further to L is unstageable injury 80% slough  20 % red. 1.5 x 1.5    Wounds on sacrum and buttocks are likely the evolution of DTI's seen on previous exam.  Plan:  Continue Venelex cont all PI prevention.                                                                      Yisroel Ramming RN, [facility]  [facility] WOCN Team  Pager [phone]  SL # [phone]

## 2015-09-16 NOTE — Progress Notes (Signed)
ID PROGRESS NOTE    Date Time: 09/16/2015 1:00 PM  Patient Name: Ernest Diaz        Subjective, ROS:    In [facility]    Fever subsiding, Tmax 100 yesterday morning   Currently off vent     Stable labs     Family at bedside     Denies pain  Wants to be suctioned   Occasional cough  No vomiting , tolerating TF  Foley in place         Antibiotics and culture results:   Antibiotics: ceftazidime #3  Midodrine   S/P vanc , zosyn       Cultures:  4/3 legionella antigen negative   4/2 Ucx Ngtd  4/2 sputum cx mod mixed upper flora   4/2 Blood cx Ngtd  3/27 BAL cx upper flora   3/22 sputum cx Stenotrophomonas maltophilia , mixed upper flora    3/20 flu negative   3/18 cervical wound cx cut flora  3/18 cervical wound anaerobic cx ngtd  3/18 cervical wound AFB and fungal cx Ngtd  3/13 Ucx >100,000 E. Faecalis   3/13 Blood cx Ngtd  3/6 MRSA negative     3/9 path FIBROVASCULAR TISSUE, FEW VESSELS AND BONE CHIPS       Lines: PIV access    Physical Exam:     Filed Vitals:    09/16/15 1030   BP:    Pulse:    Temp:    Resp:    SpO2: 90%       General: in bed, in [facility], awake and  communicative. Off vent , family  at bedside   HEENT: slightly dry mucosa ,  pale, not icteric , cervical collar in place , trach in place   Chest: S1S2 irregular , coarse BS bilaterally, few crackles,  no wheeze , BS at bases decreased.   Abdomen: soft, not tender , BS +, PEG in place   Ext: + edema   Weakness  , no movement in legs   Foley in place       Labs:       Recent CBC WITH DIFF   Recent Labs      09/16/15   0154   WBC  6.32   RBC  3.06*   HGB  9.4*   HEMATOCRIT  30.6*   MCV  100.0       Recent CMP   Recent Labs      09/16/15   0154   GLUCOSE  93   BUN  16.0   CREATININE  0.4*   SODIUM  141   POTASSIUM  4.3   CHLORIDE  101   CO2  32*           Rads:   4/5 xray improving right basilar opacity. There is no gross change in left basilar opacity. Ernest  left basilar opacity is likely due to a combination of factors including lung  consolidation and pleural effusion.           4/2 CT No CT evidence of pulmonary embolism.Large bilateral pleural effusions with adjacent infiltrates worrisome for pneumonia    4/2 doppler Thrombosis right profunda femoral vein. Thrombosis left greater saphenous vein    3/23 sono Similar findings as seen on recent CT. Thick-walled bladder without significant distention with pericholecystic fluid. Lack of gallstones and clear focal tenderness makes cholecystitis less likely. Ernest patient was uncooperative for this portable examination however. Differential considerations would include hypoalbuminemia, chronic cholecystitis  or acute cholecystitis. If continued clinical concern can perform HIDA scan. Bilateral effusions. Patient refused renal evaluation.    3/22 CT  Decompressed stomach located within Ernest anterior abdomen extending  below Ernest left hepatic lobe margin and above Ernest transverse colon.. Nonspecific gallbladder wall thickening with pericholecystic fluid and stranding in Ernest presence of small ascites. Recommend clinical correlation for acute cholecystitis.. Bilateral small effusions and basilar lower lobe consolidative atelectasis. Cardiac enlargement.    3/20 IRSuccessful deployment of a retrievable inferior vena cava filter    3/19 doppler Nonocclusive deep venous thrombosis involving a short segment of Ernest proximal femoral vein.    3/17 CT  Markedly abnormal examination with anterolisthesis of C6 upon C7  measuring 16 mm with a jumped facet on Ernest left and a comminuted displaced fracture involving Ernest right C7 articular facet. Anterior cervical spinal fusion hardware is malpositioned with both plate and screws extending into Ernest C6-C7 disc space.  Ernest AP dimension of Ernest bony spinal canal at Ernest superior C5 level is reduced to 5 mm.       3/8 MRI Postsurgical change with prior laminectomy. 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 There findings suspicious  for a disc ligamentous injury at this level. Correlation with CT is advised    3/7 IR No angiographic evidence of intracranial aneurysm, arteriovenous malformation or dural fistula. Is no angiographic evidence of spinal arteriovenous malformation or dural fistula.  20-30% stenosis of Ernest cervical left internal carotid arteries described.    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   - worsening of weakness due to Fracture dislocation C6-C7 with malpositioned hardware  - S/P C5-T1 posterior fusion, C5-C7 anterior fusion with partial C7 corpectomy on 3/18. cx normal flora     Other issues during admission:  Fever/SIRS on 3/12   Symptoms of aspiration with increased cough and mild-mod oropharyngeal dysphagia   Urinary retention, required reinsertion of foley and E. Faecalis UTI  Leukocytosis  DVT (S/P IVC filter)    Hypoxic respiratory failure overnight 3/21-3/22   Now with Near complete opacification of Ernest left lung   Gallbladder wall thickening per imaging   DVT       Plan:   1. SIRS.   Was due to UTI and aspiration pneumonia.    Recent fever could be due to pneumonia/also DVT  - repeat Ucx and Blood cx negative  - sputum cx normal flora     On ceftazidime.   Fever subsiding and hemodynamically more stable.  Continue to monitor closely.       2. Recurrent Hypoxic respiratory failure and aspiration pneumonia .  Episode on 3/21-22 due to aspiration/malositioned Corpak (per xray not in Ernest stomach), required BiPAP and NRB. (Sputum cx normal flora and Stenotrophomonas maltophilia)     Near complete opacification of  Ernest left lung on 3/27 xray , probably mucous plug after extubation.      S/P intubation and bronch on 3/27  . ' Copious thick white secretions were noted in Ernest leftmainstem bronchus. Left airway mucosa was edematous and  friable with some mucosal bleeding' . Sputum cx normal flora     S/P trach 3/28     S/P a course of levaquin     Last CT with large effusion and consolidation/   Repeat sputum cx normal flora.     On ceftazidime.  Last xray: improving right basilar opacity. There is no gross change in left basilar opacity  Would finish a 7 day course   Need for thoracentesis per pulmonary      3. E. Faecalis UTI.   Cath related   Foley was removed/then replaced x 2 given retention   Has finished Ernest course. \  Repeat Ucx negative       4. 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   S/P C5-T1 posterior fusion, C5-C7 anterior fusion with partial C7 corpectomy on 3/18. cx normal flora     5. Leukocytosis.   Due to above.   Resolved.    6. Gallbladder wall thickening  No symptoms suggestive of cholecystitis for now     7. DVT   Per primary     Discussed with Ernest patient and  family .  Discussed with nursing .     Signed by: [place]. [id number]  Infectious Disease  Phone: [phone]  Fax: [phone]

## 2015-09-17 LAB — CBC AND DIFFERENTIAL
Basophils Absolute Automated: 0.01 10*3/uL (ref 0.00–0.20)
Basophils Automated: 0 %
Eosinophils Absolute Automated: 0.02 10*3/uL (ref 0.00–0.70)
Eosinophils Automated: 0 %
Hematocrit: 31.4 % — ABNORMAL LOW (ref 42.0–52.0)
Hgb: 9.7 g/dL — ABNORMAL LOW (ref 13.0–17.0)
Immature Granulocytes Absolute: 0.03 10*3/uL
Immature Granulocytes: 1 %
Lymphocytes Absolute Automated: 1.39 10*3/uL (ref 0.50–4.40)
Lymphocytes Automated: 23 %
MCH: 30.5 pg (ref 28.0–32.0)
MCHC: 30.9 g/dL — ABNORMAL LOW (ref 32.0–36.0)
MCV: 98.7 fL (ref 80.0–100.0)
MPV: 10.8 fL (ref 9.4–12.3)
Monocytes Absolute Automated: 0.45 10*3/uL (ref 0.00–1.20)
Monocytes: 8 %
Neutrophils Absolute: 4.06 10*3/uL (ref 1.80–8.10)
Neutrophils: 68 %
Nucleated RBC: 0 /100 WBC (ref 0–1)
Platelets: 219 10*3/uL (ref 140–400)
RBC: 3.18 10*6/uL — ABNORMAL LOW (ref 4.70–6.00)
RDW: 15 % (ref 12–15)
WBC: 5.96 10*3/uL (ref 3.50–10.80)

## 2015-09-17 LAB — GFR: EGFR: >60

## 2015-09-17 LAB — MAGNESIUM: Magnesium: 1.9 mg/dL (ref 1.6–2.6)

## 2015-09-17 LAB — BASIC METABOLIC PANEL
BUN: 16 mg/dL (ref 9.0–28.0)
CO2: 34 mEq/L — ABNORMAL HIGH (ref 22–29)
Calcium: 8.1 mg/dL (ref 7.9–10.2)
Chloride: 96 mEq/L — ABNORMAL LOW (ref 100–111)
Creatinine: 0.4 mg/dL — ABNORMAL LOW (ref 0.7–1.3)
Glucose: 107 mg/dL — ABNORMAL HIGH (ref 70–100)
Potassium: 3.9 mEq/L (ref 3.5–5.1)
Sodium: 137 mEq/L (ref 136–145)

## 2015-09-17 MED ORDER — POTASSIUM CHLORIDE 20 MEQ PO PACK
40.0000 meq | PACK | Freq: Once | ORAL | Status: AC
Start: 2015-09-17 — End: 2015-09-17
  Administered 2015-09-17: 40 meq via ORAL
  Filled 2015-09-17: qty 2

## 2015-09-17 MED ORDER — SENNOSIDES-DOCUSATE SODIUM 8.6-50 MG PO TABS
1.0000 | ORAL_TABLET | Freq: Every evening | ORAL | Status: DC
Start: 2015-09-17 — End: 2015-09-24
  Administered 2015-09-17 – 2015-09-23 (×7): 1 via ORAL
  Filled 2015-09-17 (×2): qty 1
  Filled 2015-09-17: qty 2
  Filled 2015-09-17 (×4): qty 1

## 2015-09-17 MED ORDER — MAGNESIUM SULFATE IN D5W 10-5 MG/ML-% IV SOLN
1.0000 g | INTRAVENOUS | Status: AC
Start: 2015-09-17 — End: 2015-09-17
  Administered 2015-09-17 (×2): 1 g via INTRAVENOUS
  Filled 2015-09-17: qty 100

## 2015-09-17 MED ORDER — QUETIAPINE FUMARATE 25 MG PO TABS
12.5000 mg | ORAL_TABLET | Freq: Every evening | ORAL | Status: DC
Start: 2015-09-17 — End: 2015-09-20
  Administered 2015-09-17 – 2015-09-19 (×3): 12.5 mg via ORAL
  Filled 2015-09-17 (×3): qty 1

## 2015-09-17 MED ORDER — FUROSEMIDE 10 MG/ML IJ SOLN
40.0000 mg | Freq: Every day | INTRAMUSCULAR | Status: DC
Start: 2015-09-18 — End: 2015-09-21
  Administered 2015-09-18 – 2015-09-21 (×4): 40 mg via INTRAVENOUS
  Filled 2015-09-17 (×4): qty 4

## 2015-09-17 MED ORDER — POTASSIUM CHLORIDE CRYS ER 20 MEQ PO TBCR
40.0000 meq | EXTENDED_RELEASE_TABLET | Freq: Once | ORAL | Status: DC
Start: 2015-09-17 — End: 2015-09-17

## 2015-09-17 NOTE — Progress Notes (Signed)
ID PROGRESS NOTE    Date Time: 09/17/2015 11:35 AM  Patient Name: Ernest Diaz        Subjective, ROS:    In [facility]    Fever subsiding, Tmax 99.9  Off vent   Stable labs     Family at bedside   speaking valve in place, able to communicate   Denies pain  Occasional cough, denies SOB   No vomiting , tolerating TF  Foley in place         Antibiotics and culture results:   Antibiotics: ceftazidime #4  Midodrine   S/P vanc , zosyn       Cultures:  4/3 legionella antigen negative   4/2 Ucx Ngtd  4/2 sputum cx mod mixed upper flora   4/2 Blood cx Ngtd  3/27 BAL cx upper flora   3/22 sputum cx Stenotrophomonas maltophilia , mixed upper flora    3/20 flu negative   3/18 cervical wound cx cut flora  3/18 cervical wound anaerobic cx ngtd  3/18 cervical wound AFB and fungal cx Ngtd  3/13 Ucx >100,000 E. Faecalis   3/13 Blood cx Ngtd  3/6 MRSA negative     3/9 path FIBROVASCULAR TISSUE, FEW VESSELS AND BONE CHIPS       Lines: PIV access    Physical Exam:     Filed Vitals:    09/17/15 0922   BP:    Pulse:    Temp:    Resp:    SpO2: 96%       General: in bed, in [facility], awake and  communicative. Off vent , family  at bedside , able to communicate   HEENT: moist mucosa ,  pale, not icteric , cervical collar in place , trach in place   Chest: S1S2 irregular , coarse BS bilaterally,  no wheeze , BS at bases decreased. Sounds better than yesterday   Abdomen: soft, not tender , BS +, PEG in place   Ext: + edema   Weakness  , no movement in legs   Foley in place       Labs:       Recent CBC WITH DIFF   Recent Labs      09/17/15   0413   WBC  5.96   RBC  3.18*   HGB  9.7*   HEMATOCRIT  31.4*   MCV  98.7       Recent CMP   Recent Labs      09/17/15   0413   GLUCOSE  107*   BUN  16.0   CREATININE  0.4*   SODIUM  137   POTASSIUM  3.9   CHLORIDE  96*   CO2  34*           Rads:   4/5 xray improving right basilar opacity. There is no gross change in left basilar opacity. The  left basilar opacity is likely due to a combination of  factors including lung consolidation and pleural effusion.             4/2 CT No CT evidence of pulmonary embolism.Large bilateral pleural effusions with adjacent infiltrates worrisome for pneumonia    4/2 doppler Thrombosis right profunda femoral vein. Thrombosis left greater saphenous vein    3/23 sono Similar findings as seen on recent CT. Thick-walled bladder without significant distention with pericholecystic fluid. Lack of gallstones and clear focal tenderness makes cholecystitis less likely. The patient was uncooperative for this portable examination however. Differential considerations  would include hypoalbuminemia, chronic cholecystitis or acute cholecystitis. If continued clinical concern can perform HIDA scan. Bilateral effusions. Patient refused renal evaluation.    3/22 CT  Decompressed stomach located within the anterior abdomen extending  below the left hepatic lobe margin and above the transverse colon.. Nonspecific gallbladder wall thickening with pericholecystic fluid and stranding in the presence of small ascites. Recommend clinical correlation for acute cholecystitis.. Bilateral small effusions and basilar lower lobe consolidative atelectasis. Cardiac enlargement.    3/20 IRSuccessful deployment of a retrievable inferior vena cava filter    3/19 doppler Nonocclusive deep venous thrombosis involving a short segment of the proximal femoral vein.    3/17 CT  Markedly abnormal examination with anterolisthesis of C6 upon C7  measuring 16 mm with a jumped facet on the left and a comminuted displaced fracture involving the right C7 articular facet. Anterior cervical spinal fusion hardware is malpositioned with both plate and screws extending into the C6-C7 disc space.  The AP dimension of the bony spinal canal at the superior C5 level is reduced to 5 mm.       3/8 MRI Postsurgical change with prior laminectomy. 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  There findings suspicious for a disc ligamentous injury at this level. Correlation with CT is advised    3/7 IR No angiographic evidence of intracranial aneurysm, arteriovenous malformation or dural fistula. Is no angiographic evidence of spinal arteriovenous malformation or dural fistula.  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   - worsening of weakness due to Fracture dislocation C6-C7 with malpositioned hardware  - S/P C5-T1 posterior fusion, C5-C7 anterior fusion with partial C7 corpectomy on 3/18. cx normal flora     Other issues during admission:  Fever/SIRS on 3/12   Symptoms of aspiration with increased cough and mild-mod oropharyngeal dysphagia   Urinary retention, required reinsertion of foley and E. Faecalis UTI  Leukocytosis  DVT (S/P IVC filter)    Hypoxic respiratory failure overnight 3/21-3/22   Now with Near complete opacification of the left lung   Gallbladder wall thickening per imaging   DVT       Plan:   1. SIRS.   Was due to UTI and aspiration pneumonia.    Recent fever could be due to pneumonia/also DVT  - repeat Ucx and Blood cx negative  - sputum cx normal flora     On ceftazidime.   Doing much better.   Would finish a 7 day course.      2. Recurrent Hypoxic respiratory failure and aspiration pneumonia .  Episode on 3/21-22 due to aspiration/malositioned Corpak (per xray not in the stomach), required BiPAP and NRB. (Sputum cx normal flora and Stenotrophomonas maltophilia)     Near complete opacification of the  left lung on 3/27 xray , probably mucous plug after extubation.      S/P intubation and bronch on 3/27  . ' Copious thick white secretions were noted in the leftmainstem bronchus. Left airway  mucosa was edematous and friable with some mucosal bleeding' . Sputum cx normal flora     S/P trach 3/28     S/P a course of levaquin     Last CT with large effusion and consolidation/   Repeat sputum cx normal flora.     On ceftazidime.  Last xray: improving right basilar opacity. There is no gross change in left basilar opacity  Would finish a 7 day course   Monitor respiratory status closely     3. E. Faecalis UTI.   Cath related   Foley was removed/then replaced x 2 given retention   Has finished the course. \  Repeat Ucx negative       4. 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   S/P C5-T1 posterior fusion, C5-C7 anterior fusion with partial C7 corpectomy on 3/18. cx normal flora     5. Leukocytosis.   Due to above.   Resolved.    6. Gallbladder wall thickening  No symptoms suggestive of cholecystitis for now     7. DVT   Per primary     Discussed with the patient and  family .       Signed by: [place]. [id number]  Infectious Disease  Phone: [phone]  Fax: [phone]

## 2015-09-17 NOTE — Progress Notes (Signed)
PROGRESS NOTE    Date Time: 09/17/2015 1:26 PM  Patient Name: Ernest Diaz      Assessment:   1. Respiratory failure status post tracheostomy.  2. Recurrent mucus plugging with recent pneumonia.  3. 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  4. Pleural effusion.  5.  DVT or right LE.    Plan:   On PSV this am. Will change to T piece as tolerated. Try to keep off vent tonight.  Moderate secretions  noted today.  Cont ceftaz.  Decreasing fever to 99.9. Reculture sputum still pending.  Able to tolerate PMSVwith good phonation.  Cont Duonebs.  CTA neg for PE. Large bilateral effusions. Would hold off on tapping unless fever is persistent. F/U CXR shows improved pleural effusion.  Cont to diurese with lasix. If effusions remain large inspite of diuresis, would tap.  Repeat dopplers show thrombus in profunda femoral vein on right and superficial femoral vein on left. Eliquis started for cardiac reasons as well as DVT at 5mg  bid. OK by Neurosurgery.    Subjective:   Less secretions.     Medications:     Current Facility-Administered Medications   Medication Dose Route Frequency   . albuterol-ipratropium  3 mL Nebulization Q6H [facility]   . apixaban  5 mg per NG tube Q12H [facility]    And   . dextrose  60 mL Nasogastric Q12H SCH   . balsam peru-castor oil (VENELEX)   Topical Q12H SCH   . cefTAZidime  1 g Intravenous Q8H SCH   . famotidine  20 mg Oral Q12H SCH    Or   . famotidine  20 mg Intravenous Q12H SCH   . fentaNYL  1 patch Transdermal Q72H   . [START ON 09/18/2015] furosemide  40 mg Intravenous Daily   . gabapentin  200 mg per G tube QHS   . lactobacillus/streptococcus  2 capsule Oral Daily   . midodrine  10 mg Oral TID MEALS   . QUEtiapine  12.5 mg Oral QHS   . scopolamine  1 patch Transdermal Q72H   . senna-docusate  1 tablet Oral QHS   . terazosin  1 mg Oral QHS       Review of Systems:   A comprehensive review of systems was: Negative    Physical Exam:     Filed Vitals:    09/17/15 1200   BP:     Pulse:    Temp:    Resp:    SpO2: 94%   119/62  71  99.9  16    Intake and Output Summary (Last 24 hours) at Date Time    Intake/Output Summary (Last 24 hours) at 09/17/15 1326  Last data filed at 09/17/15 0600   Gross per 24 hour   Intake   1370 ml   Output   1400 ml   Net    -30 ml       General appearance - acyanotic, in no respiratory distress  Mental status - drowsy  Eyes - pupils equal and reactive, extraocular eye movements intact  Ears - not examined  Nose - normal and patent, no erythema, discharge or polyps  Mouth - mucous membranes moist, pharynx normal without lesions  Neck - supple, no significant adenopathy and neck collar in place  Lymphatics - no palpable lymphadenopathy, no hepatosplenomegaly  Chest - rhonchi noted bilaterally  Heart - normal rate, regular rhythm, normal S1, S2, no murmurs, rubs, clicks or gallops  Abdomen - soft,  nontender, nondistended, no masses or organomegaly  Extremities - peripheral pulses normal, no pedal edema, no clubbing or cyanosis    Labs:     Results     Procedure Component Value Units Date/Time    CULTURE BLOOD AEROBIC AND ANAEROBIC [[id number]] Collected:  09/13/15 0328    Specimen Information:  Blood, Venipuncture Updated:  09/17/15 0721    Narrative:      ORDER#: [id number]                                    ORDERED BY: NGUYEN, HUY  SOURCE: Blood, Venipuncture stick                    COLLECTED:  09/13/15 03:28  ANTIBIOTICS AT COLL.:                                RECEIVED :  09/13/15 06:22  Culture Blood Aerobic and Anaerobic        PRELIM      09/17/15 07:21  09/14/15   No Growth after 1 day/s of incubation.  09/15/15   No Growth after 2 day/s of incubation.  09/16/15   No Growth after 3 day/s of incubation.  09/17/15   No Growth after 4 day/s of incubation.      Magnesium [[id number]] Collected:  09/17/15 0413    Specimen Information:  Blood Updated:  09/17/15 0550     Magnesium 1.9 mg/dL     GFR [[id number]] Collected:  09/17/15 0413     EGFR >60.0 Updated:   09/17/15 0550    Basic Metabolic Panel [[id number]]  (Abnormal) Collected:  09/17/15 0413    Specimen Information:  Blood Updated:  09/17/15 0550     Glucose 107 (H) mg/dL      BUN 57.3 mg/dL      Creatinine 0.4 (L) mg/dL      Calcium 8.1 mg/dL      Sodium 220 mEq/L      Potassium 3.9 mEq/L      Chloride 96 (L) mEq/L      CO2 34 (H) mEq/L     CBC and differential [[id number]]  (Abnormal) Collected:  09/17/15 0413    Specimen Information:  Blood from Blood Updated:  09/17/15 0535     WBC 5.96 x10 3/uL      Hgb 9.7 (L) g/dL      Hematocrit 76.2 (L) %      Platelets 219 x10 3/uL      RBC 3.18 (L) x10 6/uL      MCV 98.7 fL      MCH 30.5 pg      MCHC 30.9 (L) g/dL      RDW 15 %      MPV 10.8 fL      Neutrophils 68 %      Lymphocytes Automated 23 %      Monocytes 8 %      Eosinophils Automated 0 %      Basophils Automated 0 %      Immature Granulocyte 1 %      Nucleated RBC 0 /100 WBC      Neutrophils Absolute 4.06 x10 3/uL      Abs Lymph Automated 1.39 x10 3/uL      Abs Mono Automated 0.45 x10 3/uL      Abs Eos  Automated 0.02 x10 3/uL      Absolute Baso Automated 0.01 x10 3/uL      Absolute Immature Granulocyte 0.03 x10 3/uL     Fungus culture [[id number]] Collected:  08/29/15 0200    Specimen Information:  Other from Wound Updated:  09/16/15 1529    Narrative:      ORDER#: [id number]                                    ORDERED BY: Nicoletta Dress  SOURCE: Wound POSTERIOR CERVICAL WOUND               COLLECTED:  08/29/15 02:00  ANTIBIOTICS AT COLL.:                                RECEIVED :  08/29/15 05:54  Stain, Fungal                              FINAL       08/29/15 12:01  08/29/15   No Fungal or Yeast Elements Seen  Culture Fungus                             PRELIM      09/16/15 15:27  09/09/15   No growth after 1 week/s of incubation.  09/16/15   No growth after 2 week/s of incubation.            Recent CBC   Recent Labs      09/17/15   0413   RBC  3.18*   HGB  9.7*   HEMATOCRIT  31.4*   MCV  98.7   MCH  30.5   MCHC   30.9*   RDW  15   MPV  10.8     Recent BMP   Recent Labs      09/17/15   0413   GLUCOSE  107*   BUN  16.0   CREATININE  0.4*   CALCIUM  8.1   SODIUM  137   POTASSIUM  3.9   CHLORIDE  96*   CO2  34*       Rads:   Radiological Procedure reviewed.    Signed by: Sherrie Mustache, MD

## 2015-09-17 NOTE — OT Progress Note (Signed)
[facility]   Occupational Therapy Treatment     Patient: Ernest Diaz    MRN#: [id number]   Unit: [facility] TOWER 4  Bed: V409/W119.14      Discharge Recommendations:   Discharge Recommendation: SNF   DME Recommended for Discharge:  (TBD at SNF)    If SNF recommended discharge disposition is not available, patient will need 2-person total assist for mobility and max-total assist for ADLs/IADLs, hospital bed, hoyer lift, w/c, bsc, stretcher transport equipment, and HHOT.     Discharge recommendations are subject to change based on patient's progress. Please refer to the most recent treatment note for the most up to date recommendation.  Assessment:   Pt motivated to participate in OT tx. Pt received desaturating at EOB. Dependent x2-3 to return to supine. RT presented for assessment. O2 sats quickly returned to 95-98% on trach mask. Attempted to use hoyer lift to transfer pt to chair; however lift not charged. Hoyer sling in place for RN staff to transfer pt at a later time. Pt placed in bed-chair position. Yellow foam block and theraputty issued. BUE HEP provided which pt demo'd with assist on bedside table. Family training provided to facilitate pt participation in HEP. Pt will continue to benefit from skilled OT to maximize independence.  Patient left without needs and call bell within reach. RN notified of session outcome.    Treatment Activities: Modified ADL retraining, functional transfer training, modified bed mobility training, education on energy conservation/fall prevention techniques, compensatory technique education, UE/fine motor coordination/strengthening, endurance training, Education on medical equipment beneficial for increasing ADL performance and safety in home environment.      Educated the patient to role of occupational therapy, plan of care, goals of therapy and HEP, safety with mobility and ADLs, energy conservation techniques.    Plan:    OT  Frequency Recommended: 2-3x/wk     Continue plan of care.       Precautions and Contraindications:   Falls  C-spine precautions  Aspen collar    Updated Medical Status/Imaging/Labs:  reviewed    Subjective: "I need air" (referring to fan in room)   Patient's medical condition is appropriate for Occupational Therapy intervention at this time.  Patient is agreeable to participation in the therapy session. Family and/or guardian are agreeable to patient's participation in the therapy session. Nursing clears patient for therapy.    Pain:   Scale: denied pain  Location:   Intervention:     Objective:   Patient received in bed with [facility] monitors, trach, trach mask, PSMV, PEG, PIV access, Aspen c-collar donned, bed alarm, fall mat in place    Cognition   Pt able to follow simple one step commands with occasional repetition and t/v cueing  Pleasant and cooperative    Functional Mobility  Rolling:  Max assist-dependent  Sit to Supine: dependent x2-3  Transfers: (attempted to use hoyer lift. Hoyer lift not charged. Sling left in place for transfer with RN staff later)    Balance  Static Sitting: max assist for positioning with supported back in bed-chair position    Self Care and Home Management  LE Dressing: dependent    Therapeutic Exercises  Yellow foam block issued  UE HEP provided which pt demo'd with assist (BUE pronation/supination, grasp/release, pincer grasp, shoulder flex/abd/add to place block in various areas on bedside table) --family training provided with wife and son  Exercise incorporated into tx.    Participation: good  Endurance: fair  Patient left with call bell within reach, all needs met, SCDs not available, fall mat in place, bed alarm engaged in reclined bed-chair position, chair alarm n/a and all questions answered. RN notified of session outcome and patient response.     Goals:  Time For Goal Achievement: 5 visits  ADL Goals  Patient will groom self: Moderate Assist  Patient will dress upper body:  Moderate Assist (with modified techniques)  Patient will dress lower body: Moderate Assist, with AE  Pt will complete bathing: Moderate Assist  Patient will toilet: Moderate Assist  Mobility and Transfer Goals  Pt will perform functional transfers: Moderate Assist  Neuro Re-Ed Goals  Pt will perform dynamic sitting balance: Minimal Assist, to increase ability to complete ADLs  Pt will sit at edge of bed: Stand by Assist, to prepare for OOB tasks  Musculoskeletal Goals  Pt will perform Home Exercise Program: stand by assist, with caregiver/family assist, to increase engagement in ADLs                   Caleen Jobs. Ketara Cavness, OTR/L, pager# [phone]    Time of Treatment  OT Received On: 09/17/15  Start Time: 1140  Stop Time: 1245  Time Calculation (min): 65 min    Treatment # 1 of 5 visits

## 2015-09-17 NOTE — Progress Notes (Signed)
Pt able to tolerate speaking valve most of the day, pt has a lot of secretions. Suctioned before valve placed and after valve removed. AUOP via foley cath. Turns and oral care q2 hours. Pt able to call out when he needs to. Sat edge of bed with therapy. family at bedside most of the day. Will continue to monitor

## 2015-09-17 NOTE — PT Progress Note (Signed)
[facility]   Physical Therapy Treatment  Patient: Ernest Diaz     MRN#: [id number]  Unit: [facility] TOWER 4 Bed: V409/W119.14    Discharge Recommendations:   Discharge Recommendation: Acute Rehab (pending tolerance and vent weaning)     DME Recommendation: TBD at rehab      If Acute Rehab (pending tolerance and vent weaning) recommended discharge disposition is not available, patient will need SNF, if SNF not available, pt will need max A x 2 for functional mobility, positioning, and pressure relief, hospital bed, hoyer lift, WC, BSC, tub bench, stretcher transport, and HHPT.        Recommendations can change; please see most recent physical therapy treatment note for updates.      Assessment:   Pt presents with PMSV in place; reaches across with RUE to hold bed rail and assist with rolling upper body toward L; max A x 2 for transfer S/L to sitting. Pt reports dizziness at EOB; BP overall low (90s-100s/50s-60s) and RN aware. Poor tolerance for EOB sitting due to: initial desaturation to 87%; removed PMSV but pt continued to desat to 82%; only able to increase to 84% with deep breaths so returned supine with bed support, RT present and pt continued to increase SpO2 values back to 97% and PMSV able to be replaced. Pt tolerated BUE therapeutic exercise, with minimal manual resistance for proximal musculature. Educated pt's son on PROM BLEs.    Treatment Activities: therapeutic exercise, neuromuscular re-education     Educated the patient to role of physical therapy, plan of care, goals of therapy and HEP, safety with mobility and ADLs, energy conservation techniques.    Plan:   PT Frequency: 3-4x/wk    Treatment/Interventions: Gait training, transfers, bed mobility, therapeutic activities, therapeutic exercises, neuromuscular re-education, functional mobility, endurance/activity tolerance, patient education and safety     Continue plan of care.       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  There is no gross change in left basilar opacity. The  left basilar opacity is likely due to a combination of factors including  lung consolidation and pleural effusion.                         Miguel Dibble, MD  09/16/2015 6:58 AM    US Venous Duplex Doppler Leg Bilateral   IMPRESSION:    1. Thrombosis right profunda femoral vein.  2. Thrombosis left greater saphenous vein.  Findings discussed with and acknowledged by Dr. Lemar Livings at 12:25 pm.  Clide Cliff, MD  09/13/2015 12:27 PM    CT Angiogram Chest   IMPRESSION:    1. No CT evidence of pulmonary embolism.  2. Large bilateral pleural effusions with adjacent infiltrates worrisome for pneumonia   J. Carole Binning, MD  09/13/2015 4:29 PM     Lab Results   Component Value Date/Time    HGB 9.7* 09/17/2015 04:13 AM    HEMATOCRIT 31.4* 09/17/2015 04:13 AM    POTASSIUM 3.9 09/17/2015 04:13 AM    SODIUM 137 09/17/2015 04:13  AM    PT INR 1.2* 09/04/2015 06:05 AM    TROPONIN I 0.01 09/13/2015 02:42 PM     Subjective:    Patient is agreeable to participation in the therapy session. Nursing clears patient for therapy.     Pt reports "The room is spinning."    Pain:   Scale: 0/10  Location: denies when asked  Intervention: meds per nursing    Objective:   Patient presents in bed with telemetry, pressure relief boots, peripheral IV, trach collar and PEG tube, indwelling urinary catheter, floor mats, bed alarm in place, and family present at this time.    Cognitive  Patient is alert and oriented x 4; pleasant and cooperative.    Functional Mobility  Rolling: max A  Supine to Sit: max A x  2  Sit to Supine: max-dep x 2  Scooting: dep to [facility]  Transfers: supine/sit only     Balance  Static Sitting: fair(-), c/o dizziness and room spinning  Dynamic Sitting: poor(+) to fair(-)    Therapeutic Exercises  BUE   Pushing/pulling with light resistance as tolerated for biceps; requires assist with triceps  horiz abd/add with light resistance as able  Able to flex/ext wrists, pronate/supinate, unable to extend digits, able to flex R digits for grip and hold bedrail; unable to flex L digits; minimal movement in L thumb    Participation Effort  good    Endurance  fair(-); desat to 82% in sitting position, even after PMSV removed; had to return supine    Pt left in bed with needs met with RN informed of status as well as outcome of PT session.    Patient left with call bell and phone within reach, SCDs off to work with OT, fall mat, bed alarm in place, all needs met and all questions answered.      Goals:    Goals  Goal Formulation: With patient/family  Time for Goal Acheivement: 7 visits  Goals: Select goal  Pt Will Go Supine To Sit: with minimal assist  Pt Will Perform Sit To Supine: with moderate assist  Pt Will Sit Edge of Bed: 11-15 min, with supervision  Pt Will Achieve Sitting Balance: 4/5 moves/returns trunkal midpoint 1-2 inches in multiple planes  Pt Will Perform Home Exer Program:  (family will be independent w/LE ROM)       Elliot Dally, DPT 2:06 PM 09/17/2015  Pager [phone]       Time of treatment:   PT Received On: 09/17/15  Start Time: 1115  Stop Time: 1210  Time Calculation (min): 55 min

## 2015-09-17 NOTE — Progress Notes (Signed)
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 Jaynie Diaz at [facility]   - s/p C6-7 ACDF, C6-C7 posterior fusion with lateral mass scews on 08/20/15 by Dr. Jaynie Diaz  - s/p  C5-T1 posterior fusion, C5-C7 anterior fusion with partial C7 corpectomy on 08/29/15 by Dr. Claudette Diaz  - NSGY following, appreciate recommendations   - Hard collar at all times secondary to neck weakness  - Continue Fentanyl patch for pain control, Gabapentin   - Palliative Care following for goals of care, code status is DNR/support ok  - Extensive discussion held with patient and family regarding prognosis     Hypoxic/hypercarbic respiratory failure s/p trach 3/28:   - Bronched 3/27 for left lung collapse from mucus plugging in the setting of stenotrophomonas growth in sputum culture   - TC trials during day, will attempt to leave off nocturnal ventilation tonight   - Pulmonology following, appreciate recommendations    - On bronchodilators, mucomyst and receiving aggressive suctioning  - Continue Scopolamine patch for secretions   - CTA negative for PE, shows b/l pleural effusions  - Per Pulmonology no indication for thoracentesis at this time, continue diuresis for pleural effusions    - Sputum culture shows many WBCs, GPC, continue Ceftazidime at this time, Vancomycin discontinued per ID recommendations, will finish Ceftazadime for 7 total days (5/7)  - CXR shows improving right basilar opacity from 09/16/15  - Blood cultures NGTD, ID following, appreciate recommendations     - Continue diuresis with Lasix 40 mg IV q day, tolerating diuresis with Midodrine for BP support   - SLP consulted for PMSV, able to communicate better today with SV    Hypotension - chronic, in the setting of  dysautonomia  - Continue Midodrine  - Not requiring Florinef at this time   - Will attempt to wean off Midodrine when not requiring diuresis from respiratory standpoint     Atrial Fibrillation: Rate controlled without medications     - Discussed with Neurosurgery Dr. 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 Was previously off Eliquis due to recent spinal cord surgery  - Resumed Eliquis on 09/13/15, d/w family and Neurosurgery before starting Eliquis. Possible risks/complications given recent spinal surgery discussed with family who are in agreement with starting AC at this time      GI/DVT prophylaxis: on Pepcid; Eliquis    Case discussed with: RN, ID, Pulmonology, Family at bedside       Safety Checklist:     DVT prophylaxis:  CHEST guideline (See page [id number]) Eliquis    Foley:  [place] Rn Foley protocol Yes   IVs:  PIV   PT/OT: Yes   Daily CBC & or Chem ordered:  SHM/ABIM guidelines (see #5) Yes   Reference for approximate charges of common labs: CBC auto diff - $76  BMP - $99  Mg - $79    Lines:     Patient Lines/Drains/Airways Status    Active PICC Line / CVC Line / PIV Line / Drain / Airway / Intraosseous Line / Epidural Line / ART Line / Line / Wound /  Pressure Ulcer / NG/OG Tube     Name:   Placement date:   Placement time:   Site:   Days:    Peripheral IV 08/29/15 Left Hand  08/29/15   1345   Hand   11    Peripheral IV 08/30/15 Right Antecubital  08/30/15   0030   Antecubital   10    Peripheral IV 09/08/15 Left Forearm  09/08/15   0400   Forearm   1    Peripheral IV 09/08/15 Right Hand  09/08/15   0400   Hand   1    Peripheral IV 09/08/15 Left Forearm  09/08/15   2200   Forearm   less than 1    Gastrostomy/Enterostomy Gastrostomy-jejunostomy  09/04/15         5    Urethral Catheter Temperature probe 16 Fr.  09/07/15   1300   Temperature probe   2    External Urinary Catheter   09/06/15   1230      3    Surgical Airway Shiley 6 mm  09/08/15   1110   6 mm   1    Wound 08/25/15 Pressure Injury Buttocks Left;Right Non-Blanchable Erythema  08/25/15   2258   Buttocks   14    Incision Site 08/20/15 Neck  08/20/15   1427     19    Incision Site 08/20/15 Neck  08/20/15   1932     19    Incision Site 08/28/15 Neck  08/28/15   2352     11    Incision Site 08/29/15 Neck  08/29/15   0515     11                 Disposition:     Today's date: 09/17/2015  Length of Stay: 31  Anticipated medical stability for discharge: 3- 4 days  Reason for ongoing hospitalization: quadriparesis  Anticipated discharge needs: TBD    Subjective     CC: Quadriplegia    Interval History/24 hour events: please see below    HPI/Subjective: Ernest Diaz is a 70 y.o. male hx afib on Eliquis + aspirin, HTN who presented to [facility] 2/25 after a fall. CT head and spine were negative so he was discharged. 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 MRI cervical and thoracic spine showed spinal epidural hematoma extending from the foramen magnum to the upper thoracic spine. Neurosurgery (Dr. Jaynie Diaz) was consulted. His Eliquis was reversed. He ultimately had C3-C5 and T3-T4 decompressive laminectomies 2/27 by Dr. Jaynie Diaz. He noted extensive dorsal epidural venous plexus and when he dissected out these vessels they bled. Dr. Jaynie Diaz requested he be transferred here for spinal angiogram. These symptoms are sudden onset, severe intensity, without alleviating factors.     3/29. More stable at [facility] ICU neurologically, stable at trach collar, no fever/diarrhea. Tolerating tube feeding. Follows commands. Not in any distress.   3/30. More alert and awake, follows commands. No any acute events noted. Family at bed side.  3/31. Had desaturation intermittently since last night, had significant secretion in his trach suctioning. No fever. More alert and awake. Not in distress now, more comfort with PS. Has mild  discomfort at trach site. No discharge from the trach site.  4/1. More alert, no any acute events noted. On PS mode. No fever, diarrhea and respiratory distress.  4/2. Has intermittent chest pain and SOB. Had low grade fever today. No cough, diarrhea,  abdominal pain and chills. Not in distress. Follows commands  4/3. no any acute events noted. Still has been on PS, no fever today. More stable.    4/4: No acute events overnight.  FiO2 increased slightly to 50%.  Requests a shave today and states pain at neck site is better today.  Denies other acute concerns/complaints at this time.    4/5: No acute events overnight.  Denies chest pain, shortness of breath, abdominal pain at this time.  Still with weak phonation.  Secretions more improved today.  Frustrated with weak phonation and inability to communicate.  Denies other acute concerns/complaints at this time.   4/6: Patient doing better today.  Denies chest pain, shortness of breath, abdominal pain at this time.  Able to communicate with PMSV today.  Secretions continue improving.  Plan of care discussed with family including POA son Mick at the bedside.  No acute events overnight.         Physical Exam:     VITAL SIGNS PHYSICAL EXAM   Temp:  [98.4 F (36.9 C)-99.9 F (37.7 C)] 99.9 F (37.7 C)  Heart Rate:  [63-81] 71  Resp Rate:  [12-34] 16  BP: (97-142)/(50-70) 119/62 mmHg  FiO2:  [40 %] 40 %    Intake/Output Summary (Last 24 hours) at 09/17/15 1303  Last data filed at 09/17/15 0600   Gross per 24 hour   Intake   1370 ml   Output   1400 ml   Net    -30 ml    Physical Exam  General: awake, alert X 3, not in distress  HEENT- NC/AT  Neck- wearing Aspen collar, trach in place- site: dry and clean   Cardiovascular: irregularly irregular, no murmurs, rubs or gallops  Lungs: coarse breath sounds and dull bilaterally, without wheezing or rales  Abdomen: soft, non-tender, non-distended; no palpable masses,  normoactive bowel sounds. PEG tube site- clean/dry/no  leakage  Extremities: 1+ LE edema b/l   Neuro: Cranial nerves- grossly intact, 4/5 in both upper extremities, 0/5 in both lower extremities       Meds:     Medications were reviewed:    Labs:     Labs (last 72 hours):      Recent Labs  Lab 09/17/15  0413 09/16/15  0154   WBC 5.96 6.32   HGB 9.7* 9.4*   HEMATOCRIT 31.4* 30.6*   PLATELETS 219 202            Recent Labs  Lab 09/17/15  0413 09/16/15  0154   SODIUM 137 141   POTASSIUM 3.9 4.3   CHLORIDE 96* 101   CO2 34* 32*   BUN 16.0 16.0   CREATININE 0.4* 0.4*   CALCIUM 8.1 7.6*   GLUCOSE 107* 93                   Microbiology, reviewed  Microbiology Results     Procedure Component Value Units Date/Time    AFB culture and smear [[id number]] Collected:  08/29/15 0200    Specimen Information:  Sputum from Wound Updated:  09/14/15 2007    Narrative:      ORDER#: [id number]                                    ORDERED BY: Nicoletta Dress  SOURCE: Wound POSTERIOR CERVICAL WOUND  COLLECTED:  08/29/15 02:00  ANTIBIOTICS AT COLL.:                                RECEIVED :  08/29/15 05:54  Stain, Acid Fast                           FINAL       08/29/15 11:42  08/29/15   No Acid Fast Bacillus Seen  Culture Acid Fast Bacillus (AFB)           PRELIM      09/14/15 20:02  09/07/15   No growth after 1 week/s of incubation.  09/14/15   No growth after 2 week/s of incubation.      Anaerobic culture [[id number]] Collected:  08/29/15 0200    Specimen Information:  Other from Wound Updated:  09/02/15 0835    Narrative:      ORDER#: [id number]                                    ORDERED BY: Nicoletta Dress  SOURCE: Wound POSTERIOR CERVICAL WOUND               COLLECTED:  08/29/15 02:00  ANTIBIOTICS AT COLL.:                                RECEIVED :  08/29/15 05:54  Culture, Anaerobic Bacteria                FINAL       09/02/15 08:35  09/02/15   No anaerobic growth      CULTURE + Dierdre Forth [[id number]] Collected:  09/13/15 1047    Specimen Information:  Sputum  from Endotracheal Updated:  09/14/15 1658    Narrative:      ORDER#: [id number]                                    ORDERED BY: Lemar Livings, EYAD  SOURCE: Endotracheal Aspirate lungs                  COLLECTED:  09/13/15 10:47  ANTIBIOTICS AT COLL.:                                RECEIVED :  09/13/15 11:56  Stain, Gram (Respiratory)                  FINAL       09/13/15 14:36  09/13/15   Many WBC's             Rare Squamous epithelial cells             Few Gram positive cocci  Culture and Gram Stain, Aerobic, RespiratorPRELIM      09/14/15 16:58  09/14/15   Moderate growth of mixed upper respiratory flora      CULTURE + Dierdre Forth [[id number]] Collected:  09/07/15 1240    Specimen Information:  Sputum from Bronchial Lavage Updated:  09/09/15 1324    Narrative:      ORDER#: [id number]  ORDERED BY: DINESCU, DAN  SOURCE: Bronchial Lavage bronchoscopy                COLLECTED:  09/07/15 12:40  ANTIBIOTICS AT COLL.:                                RECEIVED :  09/07/15 15:15  Stain, Gram (Respiratory)                  FINAL       09/07/15 17:13  09/07/15   Moderate WBC's             No Squamous epithelial cells             No organisms seen  Culture and Gram Stain, Aerobic, RespiratorFINAL       09/09/15 13:24  09/08/15   Light growth of mixed upper respiratory flora      CULTURE + Dierdre Forth [[id number]] Collected:  09/02/15 1452    Specimen Information:  Sputum from Sputum, Suctioned Updated:  09/04/15 1426    Narrative:      ORDER#: [id number]                                    ORDERED BY: Alysia Penna  SOURCE: Sputum, Suctioned sputum                     COLLECTED:  09/02/15 14:52  ANTIBIOTICS AT COLL.:                                RECEIVED :  09/02/15 18:47  Stain, Gram (Respiratory)                  FINAL       09/02/15 22:14  09/02/15   Few WBC's             No Epithelial cells             Few Mixed Respiratory Flora  Culture and Gram Stain,  Aerobic, RespiratorFINAL       09/04/15 14:26   +  09/03/15   Moderate growth of mixed upper respiratory flora  09/03/15   Light growth of Stenotrophomonas maltophilia      _____________________________________________________________________________                                    S.malto       ANTIBIOTICS                     MIC  INTRP      _____________________________________________________________________________  Ceftazidime                      4     S        Levofloxacin                    <=1    S        Trimethoprim/Sulfamethoxazole <=0.5/9  S        _____________________________________________________________________________            S=SUSCEPTIBLE     I=INTERMEDIATE     R=RESISTANT  N/S=NON-SUSCEPTIBLE  _____________________________________________________________________________      CULTURE BLOOD AEROBIC AND ANAEROBIC [[id number]] Collected:  09/13/15 0328    Specimen Information:  Blood, Venipuncture Updated:  09/15/15 0721    Narrative:      ORDER#: [id number]                                    ORDERED BY: NGUYEN, HUY  SOURCE: Blood, Venipuncture stick                    COLLECTED:  09/13/15 03:28  ANTIBIOTICS AT COLL.:                                RECEIVED :  09/13/15 06:22  Culture Blood Aerobic and Anaerobic        PRELIM      09/15/15 07:21  09/14/15   No Growth after 1 day/s of incubation.  09/15/15   No Growth after 2 day/s of incubation.      CULTURE BLOOD AEROBIC AND ANAEROBIC [[id number]] Collected:  08/24/15 0909    Specimen Information:  Blood, Venipuncture Updated:  08/29/15 1521    Narrative:      ORDER#: [id number]                                    ORDERED BY: Wynetta Fines, VIN  SOURCE: Blood, Venipuncture peripheral               COLLECTED:  08/24/15 09:09  ANTIBIOTICS AT COLL.:                                RECEIVED :  08/24/15 13:17  Culture Blood Aerobic and Anaerobic        FINAL       08/29/15 15:21  08/29/15   No growth after 5 days of incubation.       Fungus culture [[id number]] Collected:  08/29/15 0200    Specimen Information:  Other from Wound Updated:  09/09/15 1529    Narrative:      ORDER#: [id number]                                    ORDERED BY: Nicoletta Dress  SOURCE: Wound POSTERIOR CERVICAL WOUND               COLLECTED:  08/29/15 02:00  ANTIBIOTICS AT COLL.:                                RECEIVED :  08/29/15 05:54  Stain, Fungal                              FINAL       08/29/15 12:01  08/29/15   No Fungal or Yeast Elements Seen  Culture Fungus                             PRELIM      09/09/15 15:28  09/09/15   No growth  after 1 week/s of incubation.      Legionella antigen, urine [[id number]] Collected:  09/14/15 0326    Specimen Information:  Urine from Urine, Catheterized, Foley Updated:  09/14/15 0559    Narrative:      ORDER#: [id number]                                    ORDERED BY: NGUYEN, HUY  SOURCE: Urine, Catheterized, Foley                   COLLECTED:  09/14/15 03:26  ANTIBIOTICS AT COLL.:                                RECEIVED :  09/14/15 04:23  Legionella, Rapid Urinary Antigen          FINAL       09/14/15 05:58  09/14/15   Negative for Legionella pneumophila Serogroup 1 Antigen             Limitations of Test:             1. Negative results do not exclude infection with Legionella                pneumophila Serogroup 1.             2. Does not detect other serogroups of L. pneumophila                or other Legionella species.             Test Reference Range: Negative      MRSA culture [[id number]] Collected:  08/17/15 2043    Specimen Information:  Body Fluid from Nasal/Throat ASC Admission Updated:  08/19/15 0328    Narrative:      ORDER#: [id number]                                    ORDERED BY: Paulita Fujita  SOURCE: Nares and Throat                             COLLECTED:  08/17/15 20:43  ANTIBIOTICS AT COLL.:                                RECEIVED :  08/18/15 03:03  Culture MRSA Surveillance                  FINAL        08/19/15 03:28  08/19/15   Negative for Methicillin Resistant Staph aureus from Nares and             Negative for Methicillin Resistant Staph aureus from Throat      Rapid influenza A/B antigens [[id number]] Collected:  08/31/15 1649    Specimen Information:  Nasopharyngeal from Nasal Aspirate Updated:  08/31/15 1727    Narrative:      ORDER#: [id number]                                    ORDERED BY: Sterling Big  SOURCE: Nasal Aspirate  COLLECTED:  08/31/15 16:49  ANTIBIOTICS AT COLL.:                                RECEIVED :  08/31/15 17:04  Influenza Rapid Antigen A&B                FINAL       08/31/15 17:27  08/31/15   Negative for Influenza A and B             Reference Range: Negative      Urine culture [[id number]] Collected:  09/13/15 1821    Specimen Information:  Urine from Urine, Catheterized, In & Out Updated:  09/14/15 1851    Narrative:      ORDER#: [id number]                                    ORDERED BY: Cyndie Chime, HUY  SOURCE: Urine, Catheterized, In & Out                COLLECTED:  09/13/15 18:21  ANTIBIOTICS AT COLL.:                                RECEIVED :  09/13/15 21:14  Culture Urine                              FINAL       09/14/15 18:51  09/14/15   No growth of >1,000 CFU/ML, No further work      Urine culture [[id number]] Collected:  08/24/15 1718    Specimen Information:  Urine from Urine, Catheterized, Foley Updated:  08/26/15 1544    Narrative:      ORDER#: [id number]                                    ORDERED BY: Alysia Penna  SOURCE: Urine, Catheterized, Foley                   COLLECTED:  08/24/15 17:18  ANTIBIOTICS AT COLL.:                                RECEIVED :  08/24/15 23:42  Culture Urine                              FINAL       08/26/15 15:43   +  08/26/15   >100,000 CFU/ML Enterococcus faecalis      _____________________________________________________________________________                                   E.faecalis     ANTIBIOTICS                      MIC  INTRP      _____________________________________________________________________________  Ampicillin                       1     S  Gentamicin High Level [place] <=500   S        Levofloxacin                    <=1    S        Nitrofurantoin                 <=16    S  D1    Penicillin                       4     S        Streptomycin High Level Resist<=1000   S        Tetracycline                    >8     R        Vancomycin                       2     S          -----DRUG COMMENTS----------    D1:  Nitrofurantoin should only be used for the treatment of         uncomplicated cystitis.         [facility] Ant[employer] Subcommittee June 2015  _____________________________________________________________________________            S=SUSCEPTIBLE     I=INTERMEDIATE     R=RESISTANT                            N/S=NON-SUSCEPTIBLE  _____________________________________________________________________________      Wound culture & gram stain [[id number]] Collected:  08/29/15 0200    Specimen Information:  Wound from Wound Updated:  08/31/15 1209    Narrative:      ORDER#: [id number]                                    ORDERED BY: Nicoletta Dress  SOURCE: Wound POSTERIOR CERVICAL WOUND               COLLECTED:  08/29/15 02:00  ANTIBIOTICS AT COLL.:                                RECEIVED :  08/29/15 05:54  Stain, Gram                                FINAL       08/29/15 08:45  08/29/15   No Squamous epithelial cells seen             Rare WBCs             No organisms seen  Culture and Gram Stain, Aerobic, Wound     FINAL       08/31/15 12:09   +  08/31/15   Very light growth of mixed cutaneous flora            Imaging, reviewed  No results found.      Signed by: Ernest Cowman, MD

## 2015-09-18 ENCOUNTER — Inpatient Hospital Stay: Payer: Medicare Other

## 2015-09-18 LAB — BASIC METABOLIC PANEL
BUN: 14 mg/dL (ref 9.0–28.0)
CO2: 33 mEq/L — ABNORMAL HIGH (ref 22–29)
Calcium: 8.2 mg/dL (ref 7.9–10.2)
Chloride: 101 mEq/L (ref 100–111)
Creatinine: 0.4 mg/dL — ABNORMAL LOW (ref 0.7–1.3)
Glucose: 109 mg/dL — ABNORMAL HIGH (ref 70–100)
Potassium: 4.6 mEq/L (ref 3.5–5.1)
Sodium: 141 mEq/L (ref 136–145)

## 2015-09-18 LAB — CBC AND DIFFERENTIAL
Basophils Absolute Automated: 0.01 10*3/uL (ref 0.00–0.20)
Basophils Automated: 0 %
Eosinophils Absolute Automated: 0.04 10*3/uL (ref 0.00–0.70)
Eosinophils Automated: 1 %
Hematocrit: 32.9 % — ABNORMAL LOW (ref 42.0–52.0)
Hgb: 9.9 g/dL — ABNORMAL LOW (ref 13.0–17.0)
Immature Granulocytes Absolute: 0.02 10*3/uL
Immature Granulocytes: 0 %
Lymphocytes Absolute Automated: 1.22 10*3/uL (ref 0.50–4.40)
Lymphocytes Automated: 25 %
MCH: 30 pg (ref 28.0–32.0)
MCHC: 30.1 g/dL — ABNORMAL LOW (ref 32.0–36.0)
MCV: 99.7 fL (ref 80.0–100.0)
MPV: 10.7 fL (ref 9.4–12.3)
Monocytes Absolute Automated: 0.42 10*3/uL (ref 0.00–1.20)
Monocytes: 9 %
Neutrophils Absolute: 3.16 10*3/uL (ref 1.80–8.10)
Neutrophils: 65 %
Nucleated RBC: 0 /100 WBC (ref 0–1)
Platelets: 219 10*3/uL (ref 140–400)
RBC: 3.3 10*6/uL — ABNORMAL LOW (ref 4.70–6.00)
RDW: 15 % (ref 12–15)
WBC: 4.87 10*3/uL (ref 3.50–10.80)

## 2015-09-18 LAB — MAGNESIUM: Magnesium: 2.2 mg/dL (ref 1.6–2.6)

## 2015-09-18 LAB — GFR: EGFR: >60

## 2015-09-18 NOTE — Progress Notes (Signed)
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 Jaynie Collins at [facility]   - s/p C6-7 ACDF, C6-C7 posterior fusion with lateral mass scews on 08/20/15 by Dr. Jaynie Collins  - s/p  C5-T1 posterior fusion, C5-C7 anterior fusion with partial C7 corpectomy on 08/29/15 by Dr. Claudette Laws  - NSGY following, appreciate recommendations   - Hard collar at all times secondary to neck weakness  - Continue Fentanyl patch for pain control, Gabapentin   - Palliative Care following for goals of care, code status is DNR/support ok  - Extensive discussion held with patient and family regarding prognosis     Hypoxic/hypercarbic respiratory failure s/p trach 3/28:   - Bronched 3/27 for left lung collapse from mucus plugging in the setting of stenotrophomonas growth in sputum culture   - TC trials during day, will attempt to leave off nocturnal ventilation tonight   - Pulmonology following, appreciate recommendations    - On bronchodilators, mucomyst and receiving aggressive suctioning  - Continue Scopolamine patch for secretions   - CTA negative for PE, shows b/l pleural effusions  - Per Pulmonology no indication for thoracentesis at this time, continue diuresis for pleural effusions    - Sputum culture shows many WBCs, GPC, continue Ceftazidime at this time, Vancomycin discontinued per ID recommendations, will finish Ceftazadime for 7 total days (Day 6/7)  - CXR shows b/l pleural effusions and atelectasis from 4/7  - Blood cultures negative, ID following, appreciate recommendations     - Continue diuresis with Lasix 40 mg IV q day, tolerating diuresis with Midodrine for BP support   - SLP consulted for PMSV, requires aggressive suctioning however able to communicate with PMSV    Hypotension -  chronic, in the setting of dysautonomia  - Continue Midodrine  - Not requiring Florinef at this time   - Will attempt to wean off Midodrine when not requiring diuresis from respiratory standpoint     Atrial Fibrillation: Rate controlled without medications     - Discussed with Neurosurgery Dr. 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 Was previously off Eliquis due to recent spinal cord surgery  - Resumed Eliquis on 09/13/15, d/w family and Neurosurgery before starting Eliquis. Possible risks/complications given recent spinal surgery discussed with family who are in agreement with starting AC at this time      GI/DVT prophylaxis: on Pepcid; Eliquis    Case discussed with: RN, ID, Pulmonology, Family      Safety Checklist:     DVT prophylaxis:  CHEST guideline (See page [id number]) Eliquis    Foley:  [place] Rn Foley protocol Yes   IVs:  PIV   PT/OT: Yes   Daily CBC & or Chem ordered:  SHM/ABIM guidelines (see #5) Yes   Reference for approximate charges of common labs: CBC auto diff - $76  BMP - $99  Mg - $79    Lines:     Patient Lines/Drains/Airways Status    Active PICC Line / CVC Line / PIV Line / Drain / Airway / Intraosseous Line / Epidural Line / ART Line / Line / Wound /  Pressure Ulcer / NG/OG Tube     Name:   Placement date:   Placement time:   Site:   Days:    Peripheral IV 08/29/15 Left Hand  08/29/15   1345   Hand   11    Peripheral IV 08/30/15 Right Antecubital  08/30/15   0030   Antecubital   10    Peripheral IV 09/08/15 Left Forearm  09/08/15   0400   Forearm   1    Peripheral IV 09/08/15 Right Hand  09/08/15   0400   Hand   1    Peripheral IV 09/08/15 Left Forearm  09/08/15   2200   Forearm   less than 1    Gastrostomy/Enterostomy Gastrostomy-jejunostomy  09/04/15         5    Urethral Catheter Temperature probe 16 Fr.  09/07/15   1300   Temperature probe   2    External Urinary Catheter   09/06/15   1230      3    Surgical Airway Shiley 6 mm  09/08/15   1110   6 mm   1    Wound 08/25/15 Pressure Injury Buttocks Left;Right Non-Blanchable Erythema  08/25/15   2258   Buttocks   14    Incision Site 08/20/15 Neck  08/20/15   1427     19    Incision Site 08/20/15 Neck  08/20/15   1932     19    Incision Site 08/28/15 Neck  08/28/15   2352     11    Incision Site 08/29/15 Neck  08/29/15   0515     11                 Disposition:     Today's date: 09/18/2015  Length of Stay: 32  Anticipated medical stability for discharge: 3- 4 days  Reason for ongoing hospitalization: quadriparesis  Anticipated discharge needs: TBD    Subjective     CC: Quadriplegia    Interval History/24 hour events: please see below    HPI/Subjective: Ernest Diaz is a 70 y.o. male hx afib on Eliquis + aspirin, HTN who presented to [facility] 2/25 after a fall. CT head and spine were negative so he was discharged. 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 MRI cervical and thoracic spine showed spinal epidural hematoma extending from the foramen magnum to the upper thoracic spine. Neurosurgery (Dr. Jaynie Collins) was consulted. His Eliquis was reversed. He ultimately had C3-C5 and T3-T4 decompressive laminectomies 2/27 by Dr. Jaynie Collins. He noted extensive dorsal epidural venous plexus and when he dissected out these vessels they bled. Dr. Jaynie Collins requested he be transferred here for spinal angiogram. These symptoms are sudden onset, severe intensity, without alleviating factors.     3/29. More stable at [facility] ICU neurologically, stable at trach collar, no fever/diarrhea. Tolerating tube feeding. Follows commands. Not in any distress.   3/30. More alert and awake, follows commands. No any acute events noted. Family at bed side.  3/31. Had desaturation intermittently since last night, had significant secretion in his trach suctioning. No fever. More alert and awake. Not in distress now, more comfort with PS. Has mild  discomfort at trach site. No discharge from the trach site.  4/1. More alert, no any acute events noted. On PS mode. No fever, diarrhea and respiratory distress.  4/2. Has intermittent chest pain and SOB. Had low grade fever today. No cough, diarrhea,  abdominal pain and chills. Not in distress. Follows commands  4/3. no any acute events noted. Still has been on PS, no fever today. More stable.    4/4: No acute events overnight.  FiO2 increased slightly to 50%.  Requests a shave today and states pain at neck site is better today.  Denies other acute concerns/complaints at this time.    4/5: No acute events overnight.  Denies chest pain, shortness of breath, abdominal pain at this time.  Still with weak phonation.  Secretions more improved today.  Frustrated with weak phonation and inability to communicate.  Denies other acute concerns/complaints at this time.   4/6: Patient doing better today.  Denies chest pain, shortness of breath, abdominal pain at this time.  Able to communicate with PMSV today.  Secretions continue improving.  Plan of care discussed with family including POA son Mick at the bedside.  No acute events overnight.    4/7: This AM, had episode of becoming bradycardic and hypoxic when attempting trial with PMSV.  Required deep suctioning with lots of secretions present.  O2 saturation and HR improved afterwards.  Denies chest pain, shortness of breath, abdominal pain.  Has been able to communicate with PMSV although phonation weak.  Denies other acute concerns/complaints at this time.           Physical Exam:     VITAL SIGNS PHYSICAL EXAM   Temp:  [97.9 F (36.6 C)-98 F (36.7 C)] 98 F (36.7 C)  Heart Rate:  [60-78] 75  Resp Rate:  [12-33] 28  BP: (82-145)/(50-74) 143/74 mmHg  FiO2:  [40 %] 40 %    Intake/Output Summary (Last 24 hours) at 09/18/15 1500  Last data filed at 09/18/15 1400   Gross per 24 hour   Intake   1985 ml   Output   3410 ml   Net  -1425 ml    Physical Exam  General: awake, alert  X 3, not in distress  HEENT- NC/AT  Neck- wearing Aspen collar, trach in place- site: dry and clean   Cardiovascular: irregularly irregular, no murmurs, rubs or gallops  Lungs: coarse breath sounds and dull bilaterally, without wheezing or rales  Abdomen: soft, non-tender, non-distended; no palpable masses,  normoactive bowel sounds. PEG tube site- clean/dry/no leakage  Extremities: 1+ LE edema b/l   Neuro: Cranial nerves- grossly intact, 4/5 in both upper extremities, 0/5 in both lower extremities, follows commands, sensation intact in upper and lower extremities b/l       Meds:     Medications were reviewed:    Labs:     Labs (last 72 hours):      Recent Labs  Lab 09/18/15  0408 09/17/15  0413   WBC 4.87 5.96   HGB 9.9* 9.7*   HEMATOCRIT 32.9* 31.4*   PLATELETS 219 219            Recent Labs  Lab 09/18/15  0408 09/17/15  0413   SODIUM 141 137   POTASSIUM 4.6 3.9   CHLORIDE 101 96*   CO2 33* 34*   BUN 14.0 16.0   CREATININE 0.4* 0.4*   CALCIUM 8.2 8.1   GLUCOSE 109* 107*                   Microbiology, reviewed  Microbiology Results     Procedure Component Value Units Date/Time    AFB culture and smear [[id number]] Collected:  08/29/15 0200    Specimen Information:  Sputum from Wound  Updated:  09/14/15 2007    Narrative:      ORDER#: [id number]                                    ORDERED BY: Nicoletta Dress  SOURCE: Wound POSTERIOR CERVICAL WOUND               COLLECTED:  08/29/15 02:00  ANTIBIOTICS AT COLL.:                                RECEIVED :  08/29/15 05:54  Stain, Acid Fast                           FINAL       08/29/15 11:42  08/29/15   No Acid Fast Bacillus Seen  Culture Acid Fast Bacillus (AFB)           PRELIM      09/14/15 20:02  09/07/15   No growth after 1 week/s of incubation.  09/14/15   No growth after 2 week/s of incubation.      Anaerobic culture [[id number]] Collected:  08/29/15 0200    Specimen Information:  Other from Wound Updated:  09/02/15 0835    Narrative:      ORDER#: [id number]                                     ORDERED BY: Nicoletta Dress  SOURCE: Wound POSTERIOR CERVICAL WOUND               COLLECTED:  08/29/15 02:00  ANTIBIOTICS AT COLL.:                                RECEIVED :  08/29/15 05:54  Culture, Anaerobic Bacteria                FINAL       09/02/15 08:35  09/02/15   No anaerobic growth      CULTURE + Dierdre Forth [[id number]] Collected:  09/13/15 1047    Specimen Information:  Sputum from Endotracheal Updated:  09/14/15 1658    Narrative:      ORDER#: [id number]                                    ORDERED BY: Lemar Livings, EYAD  SOURCE: Endotracheal Aspirate lungs                  COLLECTED:  09/13/15 10:47  ANTIBIOTICS AT COLL.:                                RECEIVED :  09/13/15 11:56  Stain, Gram (Respiratory)                  FINAL       09/13/15 14:36  09/13/15   Many WBC's             Rare Squamous epithelial cells             Few Gram positive cocci  Culture  and Gram Stain, Aerobic, RespiratorPRELIM      09/14/15 16:58  09/14/15   Moderate growth of mixed upper respiratory flora      CULTURE + Dierdre Forth [[id number]] Collected:  09/07/15 1240    Specimen Information:  Sputum from Bronchial Lavage Updated:  09/09/15 1324    Narrative:      ORDER#: [id number]                                    ORDERED BY: Jearld Shines, DAN  SOURCE: Bronchial Lavage bronchoscopy                COLLECTED:  09/07/15 12:40  ANTIBIOTICS AT COLL.:                                RECEIVED :  09/07/15 15:15  Stain, Gram (Respiratory)                  FINAL       09/07/15 17:13  09/07/15   Moderate WBC's             No Squamous epithelial cells             No organisms seen  Culture and Gram Stain, Aerobic, RespiratorFINAL       09/09/15 13:24  09/08/15   Light growth of mixed upper respiratory flora      CULTURE + Dierdre Forth [[id number]] Collected:  09/02/15 1452    Specimen Information:  Sputum from Sputum, Suctioned Updated:  09/04/15 1426    Narrative:       ORDER#: [id number]                                    ORDERED BY: Alysia Penna  SOURCE: Sputum, Suctioned sputum                     COLLECTED:  09/02/15 14:52  ANTIBIOTICS AT COLL.:                                RECEIVED :  09/02/15 18:47  Stain, Gram (Respiratory)                  FINAL       09/02/15 22:14  09/02/15   Few WBC's             No Epithelial cells             Few Mixed Respiratory Flora  Culture and Gram Stain, Aerobic, RespiratorFINAL       09/04/15 14:26   +  09/03/15   Moderate growth of mixed upper respiratory flora  09/03/15   Light growth of Stenotrophomonas maltophilia      _____________________________________________________________________________                                    S.malto       ANTIBIOTICS                     MIC  INTRP      _____________________________________________________________________________  Ceftazidime  4     S        Levofloxacin                    <=1    S        Trimethoprim/Sulfamethoxazole <=0.5/9  S        _____________________________________________________________________________            S=SUSCEPTIBLE     I=INTERMEDIATE     R=RESISTANT                            N/S=NON-SUSCEPTIBLE  _____________________________________________________________________________      CULTURE BLOOD AEROBIC AND ANAEROBIC [[id number]] Collected:  09/13/15 0328    Specimen Information:  Blood, Venipuncture Updated:  09/15/15 0721    Narrative:      ORDER#: [id number]                                    ORDERED BY: NGUYEN, HUY  SOURCE: Blood, Venipuncture stick                    COLLECTED:  09/13/15 03:28  ANTIBIOTICS AT COLL.:                                RECEIVED :  09/13/15 06:22  Culture Blood Aerobic and Anaerobic        PRELIM      09/15/15 07:21  09/14/15   No Growth after 1 day/s of incubation.  09/15/15   No Growth after 2 day/s of incubation.      CULTURE BLOOD AEROBIC AND ANAEROBIC [[id number]] Collected:  08/24/15 0909    Specimen  Information:  Blood, Venipuncture Updated:  08/29/15 1521    Narrative:      ORDER#: [id number]                                    ORDERED BY: Wynetta Fines, VIN  SOURCE: Blood, Venipuncture peripheral               COLLECTED:  08/24/15 09:09  ANTIBIOTICS AT COLL.:                                RECEIVED :  08/24/15 13:17  Culture Blood Aerobic and Anaerobic        FINAL       08/29/15 15:21  08/29/15   No growth after 5 days of incubation.      Fungus culture [[id number]] Collected:  08/29/15 0200    Specimen Information:  Other from Wound Updated:  09/09/15 1529    Narrative:      ORDER#: [id number]                                    ORDERED BY: Nicoletta Dress  SOURCE: Wound POSTERIOR CERVICAL WOUND               COLLECTED:  08/29/15 02:00  ANTIBIOTICS AT COLL.:  RECEIVED :  08/29/15 05:54  Stain, Fungal                              FINAL       08/29/15 12:01  08/29/15   No Fungal or Yeast Elements Seen  Culture Fungus                             PRELIM      09/09/15 15:28  09/09/15   No growth after 1 week/s of incubation.      Legionella antigen, urine [[id number]] Collected:  09/14/15 0326    Specimen Information:  Urine from Urine, Catheterized, Foley Updated:  09/14/15 0559    Narrative:      ORDER#: [id number]                                    ORDERED BY: NGUYEN, HUY  SOURCE: Urine, Catheterized, Foley                   COLLECTED:  09/14/15 03:26  ANTIBIOTICS AT COLL.:                                RECEIVED :  09/14/15 04:23  Legionella, Rapid Urinary Antigen          FINAL       09/14/15 05:58  09/14/15   Negative for Legionella pneumophila Serogroup 1 Antigen             Limitations of Test:             1. Negative results do not exclude infection with Legionella                pneumophila Serogroup 1.             2. Does not detect other serogroups of L. pneumophila                or other Legionella species.             Test Reference Range: Negative      MRSA culture [[id number]]  Collected:  08/17/15 2043    Specimen Information:  Body Fluid from Nasal/Throat ASC Admission Updated:  08/19/15 0328    Narrative:      ORDER#: [id number]                                    ORDERED BY: Paulita Fujita  SOURCE: Nares and Throat                             COLLECTED:  08/17/15 20:43  ANTIBIOTICS AT COLL.:                                RECEIVED :  08/18/15 03:03  Culture MRSA Surveillance                  FINAL       08/19/15 03:28  08/19/15   Negative for Methicillin Resistant Staph aureus from Nares and  Negative for Methicillin Resistant Staph aureus from Throat      Rapid influenza A/B antigens [[id number]] Collected:  08/31/15 1649    Specimen Information:  Nasopharyngeal from Nasal Aspirate Updated:  08/31/15 1727    Narrative:      ORDER#: [id number]                                    ORDERED BY: Irving Shows, EMIL  SOURCE: Nasal Aspirate                               COLLECTED:  08/31/15 16:49  ANTIBIOTICS AT COLL.:                                RECEIVED :  08/31/15 17:04  Influenza Rapid Antigen A&B                FINAL       08/31/15 17:27  08/31/15   Negative for Influenza A and B             Reference Range: Negative      Urine culture [[id number]] Collected:  09/13/15 1821    Specimen Information:  Urine from Urine, Catheterized, In & Out Updated:  09/14/15 1851    Narrative:      ORDER#: [id number]                                    ORDERED BY: Cyndie Chime, HUY  SOURCE: Urine, Catheterized, In & Out                COLLECTED:  09/13/15 18:21  ANTIBIOTICS AT COLL.:                                RECEIVED :  09/13/15 21:14  Culture Urine                              FINAL       09/14/15 18:51  09/14/15   No growth of >1,000 CFU/ML, No further work      Urine culture [[id number]] Collected:  08/24/15 1718    Specimen Information:  Urine from Urine, Catheterized, Foley Updated:  08/26/15 1544    Narrative:      ORDER#: [id number]                                    ORDERED BY: Alysia Penna  SOURCE: Urine, Catheterized, Foley                   COLLECTED:  08/24/15 17:18  ANTIBIOTICS AT COLL.:                                RECEIVED :  08/24/15 23:42  Culture Urine                              FINAL  08/26/15 15:43   +  08/26/15   >100,000 CFU/ML Enterococcus faecalis      _____________________________________________________________________________                                   E.faecalis     ANTIBIOTICS                     MIC  INTRP      _____________________________________________________________________________  Ampicillin                       1     S        Gentamicin High Level [place] <=500   S        Levofloxacin                    <=1    S        Nitrofurantoin                 <=16    S  D1    Penicillin                       4     S        Streptomycin High Level Resist<=1000   S        Tetracycline                    >8     R        Vancomycin                       2     S          -----DRUG COMMENTS----------    D1:  Nitrofurantoin should only be used for the treatment of         uncomplicated cystitis.         [place] [facility] [employer] June 2015  _____________________________________________________________________________            S=SUSCEPTIBLE     I=INTERMEDIATE     R=RESISTANT                            N/S=NON-SUSCEPTIBLE  _____________________________________________________________________________      Wound culture & gram stain [[id number]] Collected:  08/29/15 0200    Specimen Information:  Wound from Wound Updated:  08/31/15 1209    Narrative:      ORDER#: [id number]                                    ORDERED BY: Nicoletta Dress  SOURCE: Wound POSTERIOR CERVICAL WOUND               COLLECTED:  08/29/15 02:00  ANTIBIOTICS AT COLL.:                                RECEIVED :  08/29/15 05:54  Stain, Gram                                FINAL       08/29/15 08:45  08/29/15   No Squamous epithelial cells seen             Rare WBCs             No organisms  seen  Culture and Gram Stain, Aerobic, Wound     FINAL       08/31/15 12:09   +  08/31/15   Very light growth of mixed cutaneous flora            Imaging, reviewed  Xr Chest Ap Portable    09/18/2015   1. Small bilateral pleural effusions, left side larger than right. 2. Lower lung atelectasis. Collene Schlichter, MD 09/18/2015 12:29 PM         Signed by: Isaias Cowman, MD

## 2015-09-18 NOTE — Plan of Care (Signed)
Problem: Mechanical Ventilation  Goal: Tracheostomy will be maintained  Patient maintaining well on trach mask 40 percent. Transitioned to vent overnight. Performing well.

## 2015-09-18 NOTE — Progress Notes (Signed)
ID PROGRESS NOTE    Date Time: 09/18/2015 11:38 AM  Patient Name: [facility]        Subjective, ROS:    In [facility]    Afebrile   Stable labs   Didn't tolerate speaking valve this morning   Had large amount of secretions     Now on T piece       Family at bedside   Denies pain  Occasional cough, denies SOB   Thick secretion , white per nurse   No vomiting , tolerating TF  Foley in place         Antibiotics and culture results:   Antibiotics: ceftazidime #5  Midodrine   S/P vanc , zosyn       Cultures:  4/3 legionella antigen negative   4/2 Ucx Ngtd  4/2 sputum cx mod mixed upper flora   4/2 Blood cx Ngtd  3/27 BAL cx upper flora   3/22 sputum cx Stenotrophomonas maltophilia , mixed upper flora    3/20 flu negative   3/18 cervical wound cx cut flora  3/18 cervical wound anaerobic cx ngtd  3/18 cervical wound AFB and fungal cx Ngtd  3/13 Ucx >100,000 E. Faecalis   3/13 Blood cx Ngtd  3/6 MRSA negative     3/9 path FIBROVASCULAR TISSUE, FEW VESSELS AND BONE CHIPS       Lines: PIV access    Physical Exam:     Filed Vitals:    09/18/15 0942   BP:    Pulse: 78   Temp:    Resp: 17   SpO2: 99%       General: in bed, in [facility], awake and  communicative. Off vent , family  at bedside , nurse at bedside    HEENT: moist mucosa ,  pale, not icteric , cervical collar in place , trach in place   Chest: S1S2 irregular , crackles bilaterally,  no wheeze , BS at bases decreased. Sounds better than yesterday   Abdomen: soft, not tender , BS +, PEG in place   Ext: + edema   Weakness  , no movement in legs   Foley in place       Labs:       Recent CBC WITH DIFF   Recent Labs      09/18/15   0408   WBC  4.87   RBC  3.30*   HGB  9.9*   HEMATOCRIT  32.9*   MCV  99.7       Recent CMP   Recent Labs      09/18/15   0408   GLUCOSE  109*   BUN  14.0   CREATININE  0.4*   SODIUM  141   POTASSIUM  4.6   CHLORIDE  101   CO2  33*           Rads:   4/5 xray improving right basilar opacity. There is no gross change in left basilar  opacity. The  left basilar opacity is likely due to a combination of factors including lung consolidation and pleural effusion.             4/2 CT No CT evidence of pulmonary embolism.Large bilateral pleural effusions with adjacent infiltrates worrisome for pneumonia    4/2 doppler Thrombosis right profunda femoral vein. Thrombosis left greater saphenous vein    3/23 sono Similar findings as seen on recent CT. Thick-walled bladder without significant distention with pericholecystic fluid. Lack of gallstones and  clear focal tenderness makes cholecystitis less likely. The patient was uncooperative for this portable examination however. Differential considerations would include hypoalbuminemia, chronic cholecystitis or acute cholecystitis. If continued clinical concern can perform HIDA scan. Bilateral effusions. Patient refused renal evaluation.    3/22 CT  Decompressed stomach located within the anterior abdomen extending  below the left hepatic lobe margin and above the transverse colon.. Nonspecific gallbladder wall thickening with pericholecystic fluid and stranding in the presence of small ascites. Recommend clinical correlation for acute cholecystitis.. Bilateral small effusions and basilar lower lobe consolidative atelectasis. Cardiac enlargement.    3/20 IRSuccessful deployment of a retrievable inferior vena cava filter    3/19 doppler Nonocclusive deep venous thrombosis involving a short segment of the proximal femoral vein.    3/17 CT  Markedly abnormal examination with anterolisthesis of C6 upon C7  measuring 16 mm with a jumped facet on the left and a comminuted displaced fracture involving the right C7 articular facet. Anterior cervical spinal fusion hardware is malpositioned with both plate and screws extending into the C6-C7 disc space.  The AP dimension of the bony spinal canal at the superior C5 level is reduced to 5 mm.       3/8 MRI Postsurgical change with prior laminectomy. 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 There findings suspicious for a disc ligamentous injury at this level. Correlation with CT is advised    3/7 IR No angiographic evidence of intracranial aneurysm, arteriovenous malformation or dural fistula. Is no angiographic evidence of spinal arteriovenous malformation or dural fistula.  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   - worsening of weakness due to Fracture dislocation C6-C7 with malpositioned hardware  - S/P C5-T1 posterior fusion, C5-C7 anterior fusion with partial C7 corpectomy on 3/18. cx normal flora     Other issues during admission:  Fever/SIRS on 3/12   Symptoms of aspiration with increased cough and mild-mod oropharyngeal dysphagia   Urinary retention, required reinsertion of foley and E. Faecalis UTI  Leukocytosis  DVT (S/P IVC filter)    Hypoxic respiratory failure overnight 3/21-3/22   Now with Near complete opacification of the left lung   Gallbladder wall thickening per imaging   DVT       Plan:   1. SIRS.   Was due to UTI and aspiration pneumonia.    Recent fever could be due to pneumonia/also DVT  - repeat Ucx and Blood cx negative  - sputum cx normal flora     On ceftazidime.   Plan is a 7 day course.      2. Recurrent Hypoxic respiratory failure and aspiration pneumonia .  Episode on 3/21-22 due to aspiration/malositioned Corpak (per xray not in the stomach), required BiPAP and NRB. (Sputum cx normal flora and Stenotrophomonas  maltophilia)     Near complete opacification of the left lung on 3/27 xray , probably mucous plug after extubation.      S/P intubation and bronch on 3/27  . '  Copious thick white secretions were noted in the leftmainstem bronchus. Left airway mucosa was edematous and friable with some mucosal bleeding' . Sputum cx normal flora     S/P trach 3/28     S/P a course of levaquin     Last CT with large effusion and consolidation/   Repeat sputum cx normal flora.     On ceftazidime.  Plan is a 7 day course   Issues with clearing secretions/deep suctioning done today   Monitor respiratory status closely     3. E. Faecalis UTI.   Cath related   Foley was removed/then replaced x 2 given retention   Has finished the course. \  Repeat Ucx negative       4. 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   S/P C5-T1 posterior fusion, C5-C7 anterior fusion with partial C7 corpectomy on 3/18. cx normal flora     5. Leukocytosis.   Due to above.   Resolved.    6. DVT   Per primary     Discussed with the patient and  family .  Discussed with nursing        Signed by: [place]. [id number]  Infectious Disease  Phone: [phone]  Fax: [phone]

## 2015-09-18 NOTE — Progress Notes (Signed)
At 8:30 patient was placed on speaking valve with Dr and RN present. As balloon was deflated patient became bradicardic and SAT rapidly decreased. Patient was bag suctioned and a Rapid Response was called. Patient was suctioned and placing back on ventilator. Rapid response was cancelled at that time. Patient was communicating with [place] at that point. Conversation with Physician now is to wait a few hours and try the speaking valve and place on trach collar for the day if patient tolerates it.

## 2015-09-18 NOTE — Progress Notes (Signed)
PROGRESS NOTE    Date Time: 09/18/2015 5:57 PM  Patient Name: Ernest Diaz      Assessment:   1. Respiratory failure status post tracheostomy.  2. Recurrent mucus plugging with recent pneumonia.  3. 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  4. Pleural effusion.  5.  DVT or right LE.    Plan:   On PSV this am. Will change to T piece as tolerated.   Cont vent support tonight.  Attempt at PMSV resulted in pt becoming bradycardic, likely due to mucus plugging and desaturation suctioned after removal of PMSV with improved sats and recovery of bradyarrhythmia.  Moderate secretions  noted today.  Cont ceftaz.  Decreasing fever to 99.9. Reculture sputum still pending.  Able to tolerate PMSVwith good phonation.  Cont Duonebs.  CTA neg for PE. Large bilateral effusions. Would hold off on tapping unless fever is persistent. F/U CXR shows improved pleural effusions.  Cont to diurese with lasix.  Repeat dopplers show thrombus in profunda femoral vein on right and superficial femoral vein on left. Eliquis started for cardiac reasons as well as DVT at 5mg  bid. OK by Neurosurgery.    Subjective:   Mucus plugging episode.     Medications:     Current Facility-Administered Medications   Medication Dose Route Frequency   . albuterol-ipratropium  3 mL Nebulization Q6H SCH   . apixaban  5 mg per NG tube Q12H [facility]    And   . dextrose  60 mL Nasogastric Q12H SCH   . balsam peru-castor oil (VENELEX)   Topical Q12H SCH   . cefTAZidime  1 g Intravenous Q8H SCH   . famotidine  20 mg Oral Q12H SCH    Or   . famotidine  20 mg Intravenous Q12H SCH   . fentaNYL  1 patch Transdermal Q72H   . furosemide  40 mg Intravenous Daily   . gabapentin  200 mg per G tube QHS   . lactobacillus/streptococcus  2 capsule Oral Daily   . midodrine  10 mg Oral TID MEALS   . QUEtiapine  12.5 mg Oral QHS   . scopolamine  1 patch Transdermal Q72H   . senna-docusate  1 tablet Oral QHS   . terazosin  1 mg Oral QHS       Review of Systems:    A comprehensive review of systems was: Negative    Physical Exam:     Filed Vitals:    09/18/15 1715   BP:    Pulse:    Temp:    Resp:    SpO2: 100%   107/57  77  98.2  15    Intake and Output Summary (Last 24 hours) at Date Time    Intake/Output Summary (Last 24 hours) at 09/18/15 1757  Last data filed at 09/18/15 1400   Gross per 24 hour   Intake   1895 ml   Output   3310 ml   Net  -1415 ml       General appearance - acyanotic, in no respiratory distress  Mental status - drowsy  Eyes - pupils equal and reactive, extraocular eye movements intact  Ears - not examined  Nose - normal and patent, no erythema, discharge or polyps  Mouth - mucous membranes moist, pharynx normal without lesions  Neck - supple, no significant adenopathy and neck collar in place  Lymphatics - no palpable lymphadenopathy, no hepatosplenomegaly  Chest - rhonchi noted bilaterally  Heart - normal rate,  regular rhythm, normal S1, S2, no murmurs, rubs, clicks or gallops  Abdomen - soft, nontender, nondistended, no masses or organomegaly  Extremities - peripheral pulses normal, no pedal edema, no clubbing or cyanosis    Labs:     Results     Procedure Component Value Units Date/Time    CULTURE BLOOD AEROBIC AND ANAEROBIC [[id number]] Collected:  09/13/15 0328    Specimen Information:  Blood, Venipuncture Updated:  09/18/15 0921    Narrative:      ORDER#: [id number]                                    ORDERED BY: NGUYEN, HUY  SOURCE: Blood, Venipuncture stick                    COLLECTED:  09/13/15 03:28  ANTIBIOTICS AT COLL.:                                RECEIVED :  09/13/15 06:22  Culture Blood Aerobic and Anaerobic        FINAL       09/18/15 09:21  09/18/15   No growth after 5 days of incubation.      Basic Metabolic Panel [[id number]]  (Abnormal) Collected:  09/18/15 0408    Specimen Information:  Blood Updated:  09/18/15 0518     Glucose 109 (H) mg/dL      BUN 36.6 mg/dL      Creatinine 0.4 (L) mg/dL      Calcium 8.2 mg/dL      Sodium 440  mEq/L      Potassium 4.6 mEq/L      Chloride 101 mEq/L      CO2 33 (H) mEq/L     Magnesium [[id number]] Collected:  09/18/15 0408    Specimen Information:  Blood Updated:  09/18/15 0518     Magnesium 2.2 mg/dL     GFR [[id number]] Collected:  09/18/15 0408     EGFR >60.0 Updated:  09/18/15 0518    CBC and differential [[id number]]  (Abnormal) Collected:  09/18/15 0408    Specimen Information:  Blood from Blood Updated:  09/18/15 0442     WBC 4.87 x10 3/uL      Hgb 9.9 (L) g/dL      Hematocrit 06.3 (L) %      Platelets 219 x10 3/uL      RBC 3.30 (L) x10 6/uL      MCV 99.7 fL      MCH 30.0 pg      MCHC 30.1 (L) g/dL      RDW 15 %      MPV 10.7 fL      Neutrophils 65 %      Lymphocytes Automated 25 %      Monocytes 9 %      Eosinophils Automated 1 %      Basophils Automated 0 %      Immature Granulocyte 0 %      Nucleated RBC 0 /100 WBC      Neutrophils Absolute 3.16 x10 3/uL      Abs Lymph Automated 1.22 x10 3/uL      Abs Mono Automated 0.42 x10 3/uL      Abs Eos Automated 0.04 x10 3/uL      Absolute Baso Automated 0.01 x10 3/uL  Absolute Immature Granulocyte 0.02 x10 3/uL           Recent CBC   Recent Labs      09/18/15   0408   RBC  3.30*   HGB  9.9*   HEMATOCRIT  32.9*   MCV  99.7   MCH  30.0   MCHC  30.1*   RDW  15   MPV  10.7     Recent BMP   Recent Labs      09/18/15   0408   GLUCOSE  109*   BUN  14.0   CREATININE  0.4*   CALCIUM  8.2   SODIUM  141   POTASSIUM  4.6   CHLORIDE  101   CO2  33*       Rads:   Radiological Procedure reviewed.    Signed by: Sherrie Mustache, MD

## 2015-09-19 LAB — CBC AND DIFFERENTIAL
Basophils Absolute Automated: 0.01 10*3/uL (ref 0.00–0.20)
Basophils Automated: 0 %
Eosinophils Absolute Automated: 0.03 10*3/uL (ref 0.00–0.70)
Eosinophils Automated: 1 %
Hematocrit: 34.3 % — ABNORMAL LOW (ref 42.0–52.0)
Hgb: 10.5 g/dL — ABNORMAL LOW (ref 13.0–17.0)
Immature Granulocytes Absolute: 0.01 10*3/uL
Immature Granulocytes: 0 %
Lymphocytes Absolute Automated: 1.2 10*3/uL (ref 0.50–4.40)
Lymphocytes Automated: 24 %
MCH: 30.4 pg (ref 28.0–32.0)
MCHC: 30.6 g/dL — ABNORMAL LOW (ref 32.0–36.0)
MCV: 99.4 fL (ref 80.0–100.0)
MPV: 10.9 fL (ref 9.4–12.3)
Monocytes Absolute Automated: 0.39 10*3/uL (ref 0.00–1.20)
Monocytes: 8 %
Neutrophils Absolute: 3.44 10*3/uL (ref 1.80–8.10)
Neutrophils: 68 %
Nucleated RBC: 0 /100 WBC (ref 0–1)
Platelets: 210 10*3/uL (ref 140–400)
RBC: 3.45 10*6/uL — ABNORMAL LOW (ref 4.70–6.00)
RDW: 15 % (ref 12–15)
WBC: 5.08 10*3/uL (ref 3.50–10.80)

## 2015-09-19 LAB — BASIC METABOLIC PANEL
BUN: 12 mg/dL (ref 9.0–28.0)
CO2: 32 mEq/L — ABNORMAL HIGH (ref 22–29)
Calcium: 8.4 mg/dL (ref 7.9–10.2)
Chloride: 100 mEq/L (ref 100–111)
Creatinine: 0.5 mg/dL — ABNORMAL LOW (ref 0.7–1.3)
Glucose: 104 mg/dL — ABNORMAL HIGH (ref 70–100)
Potassium: 4.5 mEq/L (ref 3.5–5.1)
Sodium: 141 mEq/L (ref 136–145)

## 2015-09-19 LAB — MAGNESIUM: Magnesium: 2 mg/dL (ref 1.6–2.6)

## 2015-09-19 LAB — GFR: EGFR: >60

## 2015-09-19 NOTE — Plan of Care (Signed)
Problem: Pain  Goal: Patient's pain/discomfort is manageable  Outcome: Progressing  Pt opens eyes spontaneously, oriented to person and place, disoriented to time and situation intermittently. BUE gross motor function intact and full sensation but no fine motor movements, BLE flaccid with full sensation. A fib HR 50's-80's, SBP 100's-140's, pt remains afebrile, generalized non pitting edema seen, pulses palpable throughout. Lung sounds diminished across all lung fields, oxygen saturation maintained throughout shift, pt tolerated TM trial, tracheal and oral suctioning required q1-2h producing large amounts of thick white/clear secretions. Bowel sounds active in all quadrants, no BM during shift PRN dulcolax given X1, AUOP seen in foley drainage bag, tube feeds continued no residuals seen. Skin remains unchanged, no additional significant events during shift, RN will continue to monitor pt.

## 2015-09-19 NOTE — Progress Notes (Signed)
ID PROGRESS NOTE    Date Time: 09/19/2015 1:26 PM  Patient Name: Ernest Diaz        Subjective, ROS:    In [facility]    Afebrile   Stable labs       Family at bedside   Able to communicate   Denies pain  Still with large amount of secretions    No vomiting , tolerating TF  Foley in place         Antibiotics and culture results:   Antibiotics: ceftazidime #6  Midodrine   S/P vanc , zosyn       Cultures:  4/3 legionella antigen negative   4/2 Ucx Ngtd  4/2 sputum cx mod mixed upper flora   4/2 Blood cx Ngtd  3/27 BAL cx upper flora   3/22 sputum cx Stenotrophomonas maltophilia , mixed upper flora    3/20 flu negative   3/18 cervical wound cx cut flora  3/18 cervical wound anaerobic cx ngtd  3/18 cervical wound AFB and fungal cx Ngtd  3/13 Ucx >100,000 E. Faecalis   3/13 Blood cx Ngtd  3/6 MRSA negative     3/9 path FIBROVASCULAR TISSUE, FEW VESSELS AND BONE CHIPS       Lines: PIV access    Physical Exam:     Filed Vitals:    09/19/15 1232   BP:    Pulse:    Temp:    Resp:    SpO2: 98%       General: in bed, in [facility], awake and  communicative. Off vent , communicative, family at bedside    HEENT: moist mucosa ,  pale, not icteric , cervical collar in place , trach in place   Chest: S1S2 irregular , few crackles bilaterally,  no wheeze , BS at bases decreased. Sounds better than yesterday   Abdomen: soft, not tender , BS +, PEG in place   Ext: + edema   Weakness  , no movement in legs   Foley in place       Labs:       Recent CBC WITH DIFF   Recent Labs      09/19/15   0417   WBC  5.08   RBC  3.45*   HGB  10.5*   HEMATOCRIT  34.3*   MCV  99.4       Recent CMP   Recent Labs      09/19/15   0417   GLUCOSE  104*   BUN  12.0   CREATININE  0.5*   SODIUM  141   POTASSIUM  4.5   CHLORIDE  100   CO2  32*           Rads:   4/7 xray Small bilateral pleural effusions, left side larger than right. Lower lung atelectasis            4/2 CT No CT evidence of pulmonary embolism.Large bilateral pleural effusions with  adjacent infiltrates worrisome for pneumonia    4/2 doppler Thrombosis right profunda femoral vein. Thrombosis left greater saphenous vein    3/23 sono Similar findings as seen on recent CT. Thick-walled bladder without significant distention with pericholecystic fluid. Lack of gallstones and clear focal tenderness makes cholecystitis less likely. The patient was uncooperative for this portable examination however. Differential considerations would include hypoalbuminemia, chronic cholecystitis or acute cholecystitis. If continued clinical concern can perform HIDA scan. Bilateral effusions. Patient refused renal evaluation.    3/22 CT  Decompressed  stomach located within the anterior abdomen extending  below the left hepatic lobe margin and above the transverse colon.. Nonspecific gallbladder wall thickening with pericholecystic fluid and stranding in the presence of small ascites. Recommend clinical correlation for acute cholecystitis.. Bilateral small effusions and basilar lower lobe consolidative atelectasis. Cardiac enlargement.    3/20 IRSuccessful deployment of a retrievable inferior vena cava filter    3/19 doppler Nonocclusive deep venous thrombosis involving a short segment of the proximal femoral vein.    3/17 CT  Markedly abnormal examination with anterolisthesis of C6 upon C7  measuring 16 mm with a jumped facet on the left and a comminuted displaced fracture involving the right C7 articular facet. Anterior cervical spinal fusion hardware is malpositioned with both plate and screws extending into the C6-C7 disc space.  The AP dimension of the bony spinal canal at the superior C5 level is reduced to 5 mm.       3/8 MRI Postsurgical change with prior laminectomy. 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 There findings suspicious for a disc ligamentous injury at this level. Correlation with CT is advised    3/7 IR No angiographic evidence of intracranial aneurysm,  arteriovenous malformation or dural fistula. Is no angiographic evidence of spinal arteriovenous malformation or dural fistula.  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   - worsening of weakness due to Fracture dislocation C6-C7 with malpositioned hardware  - S/P C5-T1 posterior fusion, C5-C7 anterior fusion with partial C7 corpectomy on 3/18. cx normal flora     Other issues during admission:  Fever/SIRS on 3/12   Symptoms of aspiration with increased cough and mild-mod oropharyngeal dysphagia   Urinary retention, required reinsertion of foley and E. Faecalis UTI  Leukocytosis  DVT (S/P IVC filter)    Hypoxic respiratory failure overnight 3/21-3/22   Now with Near complete opacification of the left lung   Gallbladder wall thickening per imaging   DVT       Plan:   1. SIRS.   Was due to UTI and aspiration pneumonia.    Recent fever could be due to pneumonia/also DVT  - repeat Ucx and Blood cx negative  - sputum cx normal flora     On ceftazidime.   Plan is a 7 day course.   Will reevaluate before stopping      2. Recurrent Hypoxic respiratory failure and aspiration pneumonia .  Episode on 3/21-22 due to aspiration/malositioned Corpak (per xray not in the stomach), required BiPAP and NRB. (Sputum cx normal flora and Stenotrophomonas maltophilia)     Near complete opacification of the left lung on 3/27 xray , probably mucous plug after extubation.      S/P intubation and bronch on 3/27  . ' Copious thick white secretions were noted in  the leftmainstem bronchus. Left airway mucosa was edematous and friable with some mucosal bleeding' . Sputum cx normal flora     S/P trach 3/28     S/P a course  of levaquin     Last CT with large effusion and consolidation/   Repeat sputum cx normal flora.     On ceftazidime.  Plan is a 7 day course   Still with large amount of secretions/although more clear  Reevaluate before stopping antibiotics   Monitor respiratory status closely     3. E. Faecalis UTI.   Cath related   Foley was removed/then replaced x 2 given retention   Has finished the course. \  Repeat Ucx negative       4. 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   S/P C5-T1 posterior fusion, C5-C7 anterior fusion with partial C7 corpectomy on 3/18. cx normal flora     5. Leukocytosis.   Due to above.   Resolved.    6. DVT   Per primary     Discussed with the patient and  family .         Signed by: [place]. [id number]  Infectious Disease  Phone: [phone]  Fax: [phone]

## 2015-09-19 NOTE — Progress Notes (Signed)
PROGRESS NOTE    Date Time: 09/19/2015 11:36 AM  Patient Name: Ernest Diaz      Assessment:   1. Respiratory failure status post tracheostomy.  2. Recurrent mucus plugging with recent pneumonia.  3. 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  4. Pleural effusion.  5.  DVT or right LE.    Plan:   On PSV this am. Will change to T piece as tolerated.   Cont vent support tonight.  Copious secretions noted today.  Start robinul and scopolamine.  Cont ceftaz.  Decreasing fever to 99.9. Reculture sputum still pending.  Able to tolerate PMSV with good phonation.  Cont Duonebs.  CTA neg for PE. Large bilateral effusions. Would hold off on tapping unless fever is persistent. F/U CXR shows improved pleural effusions.  Cont to diurese with lasix.  Repeat dopplers show thrombus in profunda femoral vein on right and superficial femoral vein on left. Eliquis started for cardiac reasons as well as DVT at 5mg  bid. OK by Neurosurgery.    Subjective:   Comfortable breathing.     Medications:     Current Facility-Administered Medications   Medication Dose Route Frequency   . albuterol-ipratropium  3 mL Nebulization Q6H SCH   . apixaban  5 mg per NG tube Q12H [facility]    And   . dextrose  60 mL Nasogastric Q12H SCH   . balsam peru-castor oil (VENELEX)   Topical Q12H SCH   . cefTAZidime  1 g Intravenous Q8H SCH   . famotidine  20 mg Oral Q12H SCH    Or   . famotidine  20 mg Intravenous Q12H SCH   . fentaNYL  1 patch Transdermal Q72H   . furosemide  40 mg Intravenous Daily   . gabapentin  200 mg per G tube QHS   . lactobacillus/streptococcus  2 capsule Oral Daily   . midodrine  10 mg Oral TID MEALS   . QUEtiapine  12.5 mg Oral QHS   . scopolamine  1 patch Transdermal Q72H   . senna-docusate  1 tablet Oral QHS   . terazosin  1 mg Oral QHS       Review of Systems:   A comprehensive review of systems was: Negative    Physical Exam:     Filed Vitals:    09/19/15 1000   BP: 138/66   Pulse: 81   Temp:    Resp: 18    SpO2: 96%   99.8    Intake and Output Summary (Last 24 hours) at Date Time    Intake/Output Summary (Last 24 hours) at 09/19/15 1136  Last data filed at 09/19/15 1000   Gross per 24 hour   Intake   1860 ml   Output   2260 ml   Net   -400 ml       General appearance - acyanotic, in no respiratory distress  Mental status - drowsy  Eyes - pupils equal and reactive, extraocular eye movements intact  Ears - not examined  Nose - normal and patent, no erythema, discharge or polyps  Mouth - mucous membranes moist, pharynx normal without lesions  Neck - supple, no significant adenopathy and neck collar in place  Lymphatics - no palpable lymphadenopathy, no hepatosplenomegaly  Chest - rhonchi noted bilaterally  Heart - normal rate, regular rhythm, normal S1, S2, no murmurs, rubs, clicks or gallops  Abdomen - soft, nontender, nondistended, no masses or organomegaly  Extremities - peripheral pulses normal, no pedal  edema, no clubbing or cyanosis    Labs:     Results     Procedure Component Value Units Date/Time    GFR [[id number]] Collected:  09/19/15 0417     EGFR >60.0 Updated:  09/19/15 0517    Basic Metabolic Panel [[id number]]  (Abnormal) Collected:  09/19/15 0417    Specimen Information:  Blood Updated:  09/19/15 0517     Glucose 104 (H) mg/dL      BUN 78.2 mg/dL      Creatinine 0.5 (L) mg/dL      Calcium 8.4 mg/dL      Sodium 956 mEq/L      Potassium 4.5 mEq/L      Chloride 100 mEq/L      CO2 32 (H) mEq/L     Magnesium [[id number]] Collected:  09/19/15 0417    Specimen Information:  Blood Updated:  09/19/15 0517     Magnesium 2.0 mg/dL     CBC and differential [[id number]]  (Abnormal) Collected:  09/19/15 0417    Specimen Information:  Blood from Blood Updated:  09/19/15 0500     WBC 5.08 x10 3/uL      Hgb 10.5 (L) g/dL      Hematocrit 41.3 (L) %      Platelets 210 x10 3/uL      RBC 3.45 (L) x10 6/uL      MCV 99.4 fL      MCH 30.4 pg      MCHC 30.6 (L) g/dL      RDW 15 %      MPV 10.9 fL      Neutrophils 68 %       Lymphocytes Automated 24 %      Monocytes 8 %      Eosinophils Automated 1 %      Basophils Automated 0 %      Immature Granulocyte 0 %      Nucleated RBC 0 /100 WBC      Neutrophils Absolute 3.44 x10 3/uL      Abs Lymph Automated 1.20 x10 3/uL      Abs Mono Automated 0.39 x10 3/uL      Abs Eos Automated 0.03 x10 3/uL      Absolute Baso Automated 0.01 x10 3/uL      Absolute Immature Granulocyte 0.01 x10 3/uL           Recent CBC   Recent Labs      09/19/15   0417   RBC  3.45*   HGB  10.5*   HEMATOCRIT  34.3*   MCV  99.4   MCH  30.4   MCHC  30.6*   RDW  15   MPV  10.9     Recent BMP   Recent Labs      09/19/15   0417   GLUCOSE  104*   BUN  12.0   CREATININE  0.5*   CALCIUM  8.4   SODIUM  141   POTASSIUM  4.5   CHLORIDE  100   CO2  32*       Rads:   Radiological Procedure reviewed.    Signed by: Sherrie Mustache, MD

## 2015-09-19 NOTE — Progress Notes (Signed)
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 Jaynie Collins at [facility]   - s/p C6-7 ACDF, C6-C7 posterior fusion with lateral mass scews on 08/20/15 by Dr. Jaynie Collins  - s/p  C5-T1 posterior fusion, C5-C7 anterior fusion with partial C7 corpectomy on 08/29/15 by Dr. Claudette Laws  - NSGY following, appreciate recommendations   - Hard collar at all times secondary to neck weakness  - Continue Fentanyl patch for pain control, Gabapentin   - Palliative Care following for goals of care, code status is DNR/support ok  - Extensive discussion held with patient and family regarding prognosis     Hypoxic/hypercarbic respiratory failure s/p trach 3/28:   - Bronched 3/27 for left lung collapse from mucus plugging in the setting of stenotrophomonas growth in sputum culture   - TC trials during day, will attempt to leave off nocturnal ventilation tonight   - Pulmonology following, appreciate recommendations    - On bronchodilators, mucomyst and receiving aggressive suctioning  - Continue Scopolamine patch for secretions   - CTA negative for PE, shows b/l pleural effusions  - Per Pulmonology no indication for thoracentesis at this time, continue diuresis for pleural effusions    - Sputum culture shows many WBCs, GPC, continue Ceftazidime at this time, Vancomycin discontinued per ID recommendations, will finish Ceftazadime for 7 total days (Day 7/7)  - CXR shows b/l pleural effusions and atelectasis from 4/7  - Blood cultures negative, ID following, appreciate recommendations     - Continue diuresis with Lasix 40 mg IV q day, tolerating diuresis with Midodrine for BP support   - SLP consulted for PMSV, requires aggressive suctioning however able to communicate with PMSV    Hypotension  - chronic, in the setting of dysautonomia  - Continue Midodrine  - Not requiring Florinef at this time   - Will attempt to wean off Midodrine when not requiring diuresis from respiratory standpoint     Atrial Fibrillation: Rate controlled without medications     - Discussed with Neurosurgery Dr. 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 Was previously off Eliquis due to recent spinal cord surgery  - Resumed Eliquis on 09/13/15, d/w family and Neurosurgery before starting Eliquis. Possible risks/complications given recent spinal surgery discussed with family who are in agreement with starting AC at this time      GI/DVT prophylaxis: on Pepcid; Eliquis    Case discussed with: RN, ID, Pulmonology, Family      Safety Checklist:     DVT prophylaxis:  CHEST guideline (See page [id number]) Eliquis    Foley:  [place] Rn Foley protocol Yes   IVs:  PIV   PT/OT: Yes   Daily CBC & or Chem ordered:  SHM/ABIM guidelines (see #5) Yes   Reference for approximate charges of common labs: CBC auto diff - $76  BMP - $99  Mg - $79    Lines:     Patient Lines/Drains/Airways Status    Active PICC Line / CVC Line / PIV Line / Drain / Airway / Intraosseous Line / Epidural Line / ART Line / Line / Wound /  Pressure Ulcer / NG/OG Tube     Name:   Placement date:   Placement time:   Site:   Days:    Peripheral IV 08/29/15 Left Hand  08/29/15   1345   Hand   11    Peripheral IV 08/30/15 Right Antecubital  08/30/15   0030   Antecubital   10    Peripheral IV 09/08/15 Left Forearm  09/08/15   0400   Forearm   1    Peripheral IV 09/08/15 Right Hand  09/08/15   0400   Hand   1    Peripheral IV 09/08/15 Left Forearm  09/08/15   2200   Forearm   less than 1    Gastrostomy/Enterostomy Gastrostomy-jejunostomy  09/04/15         5    Urethral Catheter Temperature probe 16 Fr.  09/07/15   1300   Temperature probe   2    External Urinary Catheter   09/06/15   1230      3    Surgical Airway Shiley 6 mm  09/08/15   1110   6 mm   1    Wound 08/25/15 Pressure Injury Buttocks Left;Right Non-Blanchable Erythema  08/25/15   2258   Buttocks   14    Incision Site 08/20/15 Neck  08/20/15   1427     19    Incision Site 08/20/15 Neck  08/20/15   1932     19    Incision Site 08/28/15 Neck  08/28/15   2352     11    Incision Site 08/29/15 Neck  08/29/15   0515     11                 Disposition:     Today's date: 09/19/2015  Length of Stay: 33  Anticipated medical stability for discharge: 3- 4 days  Reason for ongoing hospitalization: quadriparesis  Anticipated discharge needs: TBD    Subjective     CC: Quadriplegia    Interval History/24 hour events: please see below    HPI/Subjective: Ernest Diaz is a 70 y.o. male hx afib on Eliquis + aspirin, HTN who [facility] after a fall. CT head and spine were negative so he was discharged. 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 MRI cervical and thoracic spine showed spinal epidural hematoma extending from the foramen magnum to the upper thoracic spine. Neurosurgery (Dr. Jaynie Collins) was consulted. His Eliquis was reversed. He ultimately had C3-C5 and T3-T4 decompressive laminectomies 2/27 by Dr. Jaynie Collins. He noted extensive dorsal epidural venous plexus and when he dissected out these vessels they bled. Dr. Jaynie Collins requested he be transferred here for spinal angiogram. These symptoms are sudden onset, severe intensity, without alleviating factors.     3/29. More stable at [facility] ICU neurologically, stable at trach collar, no fever/diarrhea. Tolerating tube feeding. Follows commands. Not in any distress.   3/30. More alert and awake, follows commands. No any acute events noted. Family at bed side.  3/31. Had desaturation intermittently since last night, had significant secretion in his trach suctioning. No fever. More alert and awake. Not in distress now, more comfort with PS. Has mild  discomfort at trach site. No discharge from the trach site.  4/1. More alert, no any acute events noted. On PS mode. No fever, diarrhea and respiratory distress.  4/2. Has intermittent chest pain and SOB. Had low grade fever today. No cough, diarrhea,  abdominal pain and chills. Not in distress. Follows commands  4/3. no any acute events noted. Still has been on PS, no fever today. More stable.    4/4: No acute events overnight.  FiO2 increased slightly to 50%.  Requests a shave today and states pain at neck site is better today.  Denies other acute concerns/complaints at this time.    4/5: No acute events overnight.  Denies chest pain, shortness of breath, abdominal pain at this time.  Still with weak phonation.  Secretions more improved today.  Frustrated with weak phonation and inability to communicate.  Denies other acute concerns/complaints at this time.   4/6: Patient doing better today.  Denies chest pain, shortness of breath, abdominal pain at this time.  Able to communicate with PMSV today.  Secretions continue improving.  Plan of care discussed with family including POA son Mick at the bedside.  No acute events overnight.    4/7: This AM, had episode of becoming bradycardic and hypoxic when attempting trial with PMSV.  Required deep suctioning with lots of secretions present.  O2 saturation and HR improved afterwards.  Denies chest pain, shortness of breath, abdominal pain.  Has been able to communicate with PMSV although phonation weak.  Denies other acute concerns/complaints at this time.         4/8: No acute events overnight.  Denies chest pain, shortness of breath, abdominal pain.  Able to communicate with PMSV.  Still having copious secretions.  No episodes of desats.  Denies other acute concerns/complaints at this time.      Physical Exam:     VITAL SIGNS PHYSICAL EXAM   Temp:  [97.1 F (36.2 C)-99.8 F (37.7 C)] 99.3 F (37.4 C)  Heart Rate:  [59-90] 81  Resp Rate:  [12-28] 18  BP:  (91-149)/(53-74) 138/66 mmHg  FiO2:  [40 %] 40 %    Intake/Output Summary (Last 24 hours) at 09/19/15 1234  Last data filed at 09/19/15 1000   Gross per 24 hour   Intake   1770 ml   Output   1910 ml   Net   -140 ml    Physical Exam  General: awake, alert X 3, not in distress  HEENT- NC/AT  Neck- wearing Aspen collar, trach in place- site: dry and clean   Cardiovascular: irregularly irregular, no murmurs, rubs or gallops  Lungs: coarse breath sounds and dull bilaterally, without wheezing or rales  Abdomen: soft, non-tender, non-distended; no palpable masses,  normoactive bowel sounds. PEG tube site- clean/dry/no leakage  Extremities: 1+ LE edema b/l   Neuro: Cranial nerves- grossly intact, 4/5 in both upper extremities, 0/5 in both lower extremities, follows commands, sensation intact in upper and lower extremities b/l       Meds:     Medications were reviewed:    Labs:     Labs (last 72 hours):      Recent Labs  Lab 09/19/15  0417 09/18/15  0408   WBC 5.08 4.87   HGB 10.5* 9.9*   HEMATOCRIT 34.3* 32.9*   PLATELETS 210 219            Recent Labs  Lab 09/19/15  0417 09/18/15  0408   SODIUM 141 141   POTASSIUM 4.5 4.6   CHLORIDE 100 101   CO2 32* 33*   BUN 12.0 14.0   CREATININE 0.5* 0.4*   CALCIUM 8.4 8.2   GLUCOSE 104* 109*  Microbiology, reviewed  Microbiology Results     Procedure Component Value Units Date/Time    AFB culture and smear [[id number]] Collected:  08/29/15 0200    Specimen Information:  Sputum from Wound Updated:  09/14/15 2007    Narrative:      ORDER#: [id number]                                    ORDERED BY: Nicoletta Dress  SOURCE: Wound POSTERIOR CERVICAL WOUND               COLLECTED:  08/29/15 02:00  ANTIBIOTICS AT COLL.:                                RECEIVED :  08/29/15 05:54  Stain, Acid Fast                           FINAL       08/29/15 11:42  08/29/15   No Acid Fast Bacillus Seen  Culture Acid Fast Bacillus (AFB)           PRELIM      09/14/15 20:02  09/07/15   No growth  after 1 week/s of incubation.  09/14/15   No growth after 2 week/s of incubation.      Anaerobic culture [[id number]] Collected:  08/29/15 0200    Specimen Information:  Other from Wound Updated:  09/02/15 0835    Narrative:      ORDER#: [id number]                                    ORDERED BY: Nicoletta Dress  SOURCE: Wound POSTERIOR CERVICAL WOUND               COLLECTED:  08/29/15 02:00  ANTIBIOTICS AT COLL.:                                RECEIVED :  08/29/15 05:54  Culture, Anaerobic Bacteria                FINAL       09/02/15 08:35  09/02/15   No anaerobic growth      CULTURE + Dierdre Forth [[id number]] Collected:  09/13/15 1047    Specimen Information:  Sputum from Endotracheal Updated:  09/14/15 1658    Narrative:      ORDER#: [id number]                                    ORDERED BY: Lemar Livings, EYAD  SOURCE: Endotracheal Aspirate lungs                  COLLECTED:  09/13/15 10:47  ANTIBIOTICS AT COLL.:                                RECEIVED :  09/13/15 11:56  Stain, Gram (Respiratory)                  FINAL       09/13/15 14:36  09/13/15   Many  WBC's             Rare Squamous epithelial cells             Few Gram positive cocci  Culture and Gram Stain, Aerobic, RespiratorPRELIM      09/14/15 16:58  09/14/15   Moderate growth of mixed upper respiratory flora      CULTURE + Dierdre Forth [[id number]] Collected:  09/07/15 1240    Specimen Information:  Sputum from Bronchial Lavage Updated:  09/09/15 1324    Narrative:      ORDER#: [id number]                                    ORDERED BY: Jearld Shines, DAN  SOURCE: Bronchial Lavage bronchoscopy                COLLECTED:  09/07/15 12:40  ANTIBIOTICS AT COLL.:                                RECEIVED :  09/07/15 15:15  Stain, Gram (Respiratory)                  FINAL       09/07/15 17:13  09/07/15   Moderate WBC's             No Squamous epithelial cells             No organisms seen  Culture and Gram Stain, Aerobic, RespiratorFINAL        09/09/15 13:24  09/08/15   Light growth of mixed upper respiratory flora      CULTURE + Dierdre Forth [[id number]] Collected:  09/02/15 1452    Specimen Information:  Sputum from Sputum, Suctioned Updated:  09/04/15 1426    Narrative:      ORDER#: [id number]                                    ORDERED BY: Alysia Penna  SOURCE: Sputum, Suctioned sputum                     COLLECTED:  09/02/15 14:52  ANTIBIOTICS AT COLL.:                                RECEIVED :  09/02/15 18:47  Stain, Gram (Respiratory)                  FINAL       09/02/15 22:14  09/02/15   Few WBC's             No Epithelial cells             Few Mixed Respiratory Flora  Culture and Gram Stain, Aerobic, RespiratorFINAL       09/04/15 14:26   +  09/03/15   Moderate growth of mixed upper respiratory flora  09/03/15   Light growth of Stenotrophomonas maltophilia      _____________________________________________________________________________                                    S.malto       ANTIBIOTICS  MIC  INTRP      _____________________________________________________________________________  Ceftazidime                      4     S        Levofloxacin                    <=1    S        Trimethoprim/Sulfamethoxazole <=0.5/9  S        _____________________________________________________________________________            S=SUSCEPTIBLE     I=INTERMEDIATE     R=RESISTANT                            N/S=NON-SUSCEPTIBLE  _____________________________________________________________________________      CULTURE BLOOD AEROBIC AND ANAEROBIC [[id number]] Collected:  09/13/15 0328    Specimen Information:  Blood, Venipuncture Updated:  09/15/15 0721    Narrative:      ORDER#: [id number]                                    ORDERED BY: NGUYEN, HUY  SOURCE: Blood, Venipuncture stick                    COLLECTED:  09/13/15 03:28  ANTIBIOTICS AT COLL.:                                RECEIVED :  09/13/15 06:22  Culture Blood  Aerobic and Anaerobic        PRELIM      09/15/15 07:21  09/14/15   No Growth after 1 day/s of incubation.  09/15/15   No Growth after 2 day/s of incubation.      CULTURE BLOOD AEROBIC AND ANAEROBIC [[id number]] Collected:  08/24/15 0909    Specimen Information:  Blood, Venipuncture Updated:  08/29/15 1521    Narrative:      ORDER#: [id number]                                    ORDERED BY: Wynetta Fines, VIN  SOURCE: Blood, Venipuncture peripheral               COLLECTED:  08/24/15 09:09  ANTIBIOTICS AT COLL.:                                RECEIVED :  08/24/15 13:17  Culture Blood Aerobic and Anaerobic        FINAL       08/29/15 15:21  08/29/15   No growth after 5 days of incubation.      Fungus culture [[id number]] Collected:  08/29/15 0200    Specimen Information:  Other from Wound Updated:  09/09/15 1529    Narrative:      ORDER#: [id number]                                    ORDERED BY: Nicoletta Dress  SOURCE: Wound POSTERIOR CERVICAL WOUND               COLLECTED:  08/29/15 02:00  ANTIBIOTICS AT COLL.:                                RECEIVED :  08/29/15 05:54  Stain, Fungal                              FINAL       08/29/15 12:01  08/29/15   No Fungal or Yeast Elements Seen  Culture Fungus                             PRELIM      09/09/15 15:28  09/09/15   No growth after 1 week/s of incubation.      Legionella antigen, urine [[id number]] Collected:  09/14/15 0326    Specimen Information:  Urine from Urine, Catheterized, Foley Updated:  09/14/15 0559    Narrative:      ORDER#: [id number]                                    ORDERED BY: NGUYEN, HUY  SOURCE: Urine, Catheterized, Foley                   COLLECTED:  09/14/15 03:26  ANTIBIOTICS AT COLL.:                                RECEIVED :  09/14/15 04:23  Legionella, Rapid Urinary Antigen          FINAL       09/14/15 05:58  09/14/15   Negative for Legionella pneumophila Serogroup 1 Antigen             Limitations of Test:             1. Negative results do not exclude  infection with Legionella                pneumophila Serogroup 1.             2. Does not detect other serogroups of L. pneumophila                or other Legionella species.             Test Reference Range: Negative      MRSA culture [[id number]] Collected:  08/17/15 2043    Specimen Information:  Body Fluid from Nasal/Throat ASC Admission Updated:  08/19/15 0328    Narrative:      ORDER#: [id number]                                    ORDERED BY: Paulita Fujita  SOURCE: Nares and Throat                             COLLECTED:  08/17/15 20:43  ANTIBIOTICS AT COLL.:                                RECEIVED :  08/18/15 03:03  Culture MRSA Surveillance  FINAL       08/19/15 03:28  08/19/15   Negative for Methicillin Resistant Staph aureus from Nares and             Negative for Methicillin Resistant Staph aureus from Throat      Rapid influenza A/B antigens [[id number]] Collected:  08/31/15 1649    Specimen Information:  Nasopharyngeal from Nasal Aspirate Updated:  08/31/15 1727    Narrative:      ORDER#: [id number]                                    ORDERED BY: Irving Shows, EMIL  SOURCE: Nasal Aspirate                               COLLECTED:  08/31/15 16:49  ANTIBIOTICS AT COLL.:                                RECEIVED :  08/31/15 17:04  Influenza Rapid Antigen A&B                FINAL       08/31/15 17:27  08/31/15   Negative for Influenza A and B             Reference Range: Negative      Urine culture [[id number]] Collected:  09/13/15 1821    Specimen Information:  Urine from Urine, Catheterized, In & Out Updated:  09/14/15 1851    Narrative:      ORDER#: [id number]                                    ORDERED BY: Cyndie Chime, HUY  SOURCE: Urine, Catheterized, In & Out                COLLECTED:  09/13/15 18:21  ANTIBIOTICS AT COLL.:                                RECEIVED :  09/13/15 21:14  Culture Urine                              FINAL       09/14/15 18:51  09/14/15   No growth of >1,000 CFU/ML, No further work       Urine culture [[id number]] Collected:  08/24/15 1718    Specimen Information:  Urine from Urine, Catheterized, Foley Updated:  08/26/15 1544    Narrative:      ORDER#: [id number]                                    ORDERED BY: Alysia Penna  SOURCE: Urine, Catheterized, Foley                   COLLECTED:  08/24/15 17:18  ANTIBIOTICS AT COLL.:                                RECEIVED :  08/24/15 23:42  Culture Urine  FINAL       08/26/15 15:43   +  08/26/15   >100,000 CFU/ML Enterococcus faecalis      _____________________________________________________________________________                                   E.faecalis     ANTIBIOTICS                     MIC  INTRP      _____________________________________________________________________________  Ampicillin                       1     S        Gentamicin High Level [place] <=500   S        Levofloxacin                    <=1    S        Nitrofurantoin                 <=16    S  D1    Penicillin                       4     S        Streptomycin High Level Resist<=1000   S        Tetracycline                    >8     R        Vancomycin                       2     S          -----DRUG COMMENTS----------    D1:  Nitrofurantoin should only be used for the treatment of         uncomplicated cystitis.         [facility] Antimicrobial Subcommittee June 2015  _____________________________________________________________________________            S=SUSCEPTIBLE     I=INTERMEDIATE     R=RESISTANT                            N/S=NON-SUSCEPTIBLE  _____________________________________________________________________________      Wound culture & gram stain [[id number]] Collected:  08/29/15 0200    Specimen Information:  Wound from Wound Updated:  08/31/15 1209    Narrative:      ORDER#: [id number]                                    ORDERED BY: Nicoletta Dress  SOURCE: Wound POSTERIOR CERVICAL WOUND               COLLECTED:  08/29/15  02:00  ANTIBIOTICS AT COLL.:                                RECEIVED :  08/29/15 05:54  Stain, Gram                                FINAL  08/29/15 08:45  08/29/15   No Squamous epithelial cells seen             Rare WBCs             No organisms seen  Culture and Gram Stain, Aerobic, Wound     FINAL       08/31/15 12:09   +  08/31/15   Very light growth of mixed cutaneous flora            Imaging, reviewed  No results found.      Signed by: Isaias Cowman, MD

## 2015-09-20 LAB — CBC AND DIFFERENTIAL
Basophils Absolute Automated: 0.01 10*3/uL (ref 0.00–0.20)
Basophils Automated: 0 %
Eosinophils Absolute Automated: 0.04 10*3/uL (ref 0.00–0.70)
Eosinophils Automated: 1 %
Hematocrit: 34.8 % — ABNORMAL LOW (ref 42.0–52.0)
Hgb: 10.7 g/dL — ABNORMAL LOW (ref 13.0–17.0)
Immature Granulocytes Absolute: 0.01 10*3/uL
Immature Granulocytes: 0 %
Lymphocytes Absolute Automated: 2.26 10*3/uL (ref 0.50–4.40)
Lymphocytes Automated: 37 %
MCH: 30.7 pg (ref 28.0–32.0)
MCHC: 30.7 g/dL — ABNORMAL LOW (ref 32.0–36.0)
MCV: 100 fL (ref 80.0–100.0)
MPV: 10.9 fL (ref 9.4–12.3)
Monocytes Absolute Automated: 0.36 10*3/uL (ref 0.00–1.20)
Monocytes: 6 %
Neutrophils Absolute: 3.45 10*3/uL (ref 1.80–8.10)
Neutrophils: 56 %
Nucleated RBC: 0 /100 WBC (ref 0–1)
Platelets: 210 10*3/uL (ref 140–400)
RBC: 3.48 10*6/uL — ABNORMAL LOW (ref 4.70–6.00)
RDW: 15 % (ref 12–15)
WBC: 6.13 10*3/uL (ref 3.50–10.80)

## 2015-09-20 LAB — BASIC METABOLIC PANEL
BUN: 12 mg/dL (ref 9.0–28.0)
CO2: 30 mEq/L — ABNORMAL HIGH (ref 22–29)
Calcium: 8.3 mg/dL (ref 7.9–10.2)
Chloride: 98 mEq/L — ABNORMAL LOW (ref 100–111)
Creatinine: 0.4 mg/dL — ABNORMAL LOW (ref 0.7–1.3)
Glucose: 104 mg/dL — ABNORMAL HIGH (ref 70–100)
Potassium: 4.6 mEq/L (ref 3.5–5.1)
Sodium: 137 mEq/L (ref 136–145)

## 2015-09-20 LAB — GFR: EGFR: >60

## 2015-09-20 LAB — MAGNESIUM: Magnesium: 1.9 mg/dL (ref 1.6–2.6)

## 2015-09-20 MED ORDER — QUETIAPINE FUMARATE 25 MG PO TABS
25.0000 mg | ORAL_TABLET | Freq: Every evening | ORAL | Status: DC
Start: 2015-09-20 — End: 2015-09-24
  Administered 2015-09-20 – 2015-09-23 (×4): 25 mg via ORAL
  Filled 2015-09-20 (×4): qty 1

## 2015-09-20 MED ORDER — METHOCARBAMOL 500 MG PO TABS
750.0000 mg | ORAL_TABLET | Freq: Three times a day (TID) | ORAL | Status: DC
Start: 2015-09-20 — End: 2015-09-24
  Administered 2015-09-20 – 2015-09-24 (×13): 750 mg via ORAL
  Filled 2015-09-20 (×19): qty 2

## 2015-09-20 MED ORDER — MAGNESIUM SULFATE IN D5W 10-5 MG/ML-% IV SOLN
1.0000 g | INTRAVENOUS | Status: AC
Start: 2015-09-20 — End: 2015-09-20
  Administered 2015-09-20 (×2): 1 g via INTRAVENOUS
  Filled 2015-09-20: qty 200

## 2015-09-20 MED ORDER — GLYCOPYRROLATE 1 MG PO TABS
1.0000 mg | ORAL_TABLET | Freq: Three times a day (TID) | ORAL | Status: DC
Start: 2015-09-20 — End: 2015-09-24
  Administered 2015-09-20 – 2015-09-24 (×12): 1 mg via ORAL
  Filled 2015-09-20 (×18): qty 1

## 2015-09-20 MED ORDER — MORPHINE SULFATE (CONCENTRATE) 20 MG/ML PO SOLN
15.0000 mg | ORAL | Status: DC | PRN
Start: 2015-09-20 — End: 2015-09-24
  Administered 2015-09-20 – 2015-09-21 (×4): 15 mg via GASTROSTOMY
  Filled 2015-09-20 (×4): qty 1

## 2015-09-20 NOTE — Plan of Care (Signed)
Problem: Pain  Goal: Patient's pain/discomfort is manageable  Outcome: Progressing  Pt tearful this morning and fidgety, no other changes to neuro status noted, pt still in a fib rate controlled, vital signs otherwise stable, pt remains afebrile, no edema present, pulses palpable throughout. Oxygen saturation maintained to TM @ 40% throughout shift, tracheal and oral suctioning required q2h producing moderate amounts of thick clear/white secretions. Bowel sounds active in all quadrants, no BM during shift, AUOP seen in foley drainage bag, tube feeds continued max residual of 5 noted. Skin remains unchanged, pt complains of pain at back of head from aspen collar, PRN morphine given X2 and pt repositioned with good effect. No additional significant events during shift, RN will continue to monitor pt.

## 2015-09-20 NOTE — Progress Notes (Signed)
PROGRESS NOTE    Date Time: 09/20/2015 2:30 PM  Patient Name: Ernest Diaz      Assessment:   1. Respiratory failure status post tracheostomy.  2. Recurrent mucus plugging with recent pneumonia.  3. 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  4. Pleural effusion.  5.  DVT or right LE.    Plan:   On PSV last night. Now on T piece, keep as tolerated.   Keep off vent as tolerated.  Copious secretions noted today.  Cont robinul and scopolamine. May need an extra dose of IV Robinul.  Cont ceftaz, day 7.  Decreasing fever to 98.4  Reculture sputum negative.  Able to tolerate PMSV with good phonation.  Cont Duonebs.  CTA neg for PE. Large bilateral effusions. Would hold off on tapping unless fever is persistent. F/U CXR shows improved pleural effusions.  Cont to diurese with lasix.  Repeat dopplers show thrombus in profunda femoral vein on right and superficial femoral vein on left. Eliquis started for cardiac reasons as well as DVT at 5mg  bid. OK by Neurosurgery.    Subjective:   Comfortable breathing.  C/O cervical collar causing pain posteriorly.     Medications:     Current Facility-Administered Medications   Medication Dose Route Frequency   . albuterol-ipratropium  3 mL Nebulization Q6H [facility]   . apixaban  5 mg per NG tube Q12H [facility]    And   . dextrose  60 mL Nasogastric Q12H [facility]   . balsam peru-castor oil (VENELEX)   Topical Q12H [facility]   . cefTAZidime  1 g Intravenous Q8H [facility]   . famotidine  20 mg Oral Q12H [facility]    Or   . famotidine  20 mg Intravenous Q12H [facility]   . fentaNYL  1 patch Transdermal Q72H   . furosemide  40 mg Intravenous Daily   . gabapentin  200 mg per G tube QHS   . glycopyrrolate  1 mg Oral Q8H [facility]   . lactobacillus/streptococcus  2 capsule Oral Daily   . methocarbamol  750 mg Oral Q8H SCH   . midodrine  10 mg Oral TID MEALS   . QUEtiapine  25 mg Oral QHS   . scopolamine  1 patch Transdermal Q72H   . senna-docusate  1 tablet Oral QHS   . terazosin  1 mg Oral QHS       Review of  Systems:   A comprehensive review of systems was: Negative    Physical Exam:     Filed Vitals:    09/20/15 1400   BP: 133/63   Pulse: 67   Temp: 98.4 F (36.9 C)   Resp: 28   SpO2: 100%       Intake and Output Summary (Last 24 hours) at Date Time    Intake/Output Summary (Last 24 hours) at 09/20/15 1430  Last data filed at 09/20/15 1400   Gross per 24 hour   Intake   1995 ml   Output   2685 ml   Net   -690 ml       General appearance - acyanotic, in no respiratory distress  Mental status - drowsy  Eyes - pupils equal and reactive, extraocular eye movements intact  Ears - not examined  Nose - normal and patent, no erythema, discharge or polyps  Mouth - mucous membranes moist, pharynx normal without lesions  Neck - supple, no significant adenopathy and neck collar in place  Lymphatics - no palpable lymphadenopathy, no  hepatosplenomegaly  Chest - rhonchi noted bilaterally  Heart - normal rate, regular rhythm, normal S1, S2, no murmurs, rubs, clicks or gallops  Abdomen - soft, nontender, nondistended, no masses or organomegaly  Extremities - peripheral pulses normal, no pedal edema, no clubbing or cyanosis    Labs:     Results     Procedure Component Value Units Date/Time    Basic Metabolic Panel [[id number]]  (Abnormal) Collected:  09/20/15 0240    Specimen Information:  Blood Updated:  09/20/15 0318     Glucose 104 (H) mg/dL      BUN 86.5 mg/dL      Creatinine 0.4 (L) mg/dL      Calcium 8.3 mg/dL      Sodium 784 mEq/L      Potassium 4.6 mEq/L      Chloride 98 (L) mEq/L      CO2 30 (H) mEq/L     Magnesium [[id number]] Collected:  09/20/15 0240    Specimen Information:  Blood Updated:  09/20/15 0318     Magnesium 1.9 mg/dL     GFR [[id number]] Collected:  09/20/15 0240     EGFR >60.0 Updated:  09/20/15 0318    CBC and differential [[id number]]  (Abnormal) Collected:  09/20/15 0240    Specimen Information:  Blood from Blood Updated:  09/20/15 0253     WBC 6.13 x10 3/uL      Hgb 10.7 (L) g/dL      Hematocrit 34.7 (L) %       Platelets 210 x10 3/uL      RBC 3.48 (L) x10 6/uL      MCV 100.0 fL      MCH 30.7 pg      MCHC 30.7 (L) g/dL      RDW 15 %      MPV 10.9 fL      Neutrophils 56 %      Lymphocytes Automated 37 %      Monocytes 6 %      Eosinophils Automated 1 %      Basophils Automated 0 %      Immature Granulocyte 0 %      Nucleated RBC 0 /100 WBC      Neutrophils Absolute 3.45 x10 3/uL      Abs Lymph Automated 2.26 x10 3/uL      Abs Mono Automated 0.36 x10 3/uL      Abs Eos Automated 0.04 x10 3/uL      Absolute Baso Automated 0.01 x10 3/uL      Absolute Immature Granulocyte 0.01 x10 3/uL           Recent CBC   Recent Labs      09/20/15   0240   RBC  3.48*   HGB  10.7*   HEMATOCRIT  34.8*   MCV  100.0   MCH  30.7   MCHC  30.7*   RDW  15   MPV  10.9     Recent BMP   Recent Labs      09/20/15   0240   GLUCOSE  104*   BUN  12.0   CREATININE  0.4*   CALCIUM  8.3   SODIUM  137   POTASSIUM  4.6   CHLORIDE  98*   CO2  30*       Rads:   Radiological Procedure reviewed.    Signed by: Sherrie Mustache, MD

## 2015-09-20 NOTE — Progress Notes (Signed)
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 Jaynie Diaz at [facility]   - s/p C6-7 ACDF, C6-C7 posterior fusion with lateral mass scews on 08/20/15 by Dr. Jaynie Diaz  - s/p  C5-T1 posterior fusion, C5-C7 anterior fusion with partial C7 corpectomy on 08/29/15 by Dr. Claudette Diaz  - NSGY following, appreciate recommendations   - Hard collar at all times secondary to neck weakness  - Continue Fentanyl patch for pain control, Gabapentin   - Palliative Care following for goals of care, code status is DNR/support ok  - Extensive discussion held with patient and family regarding prognosis     Hypoxic/hypercarbic respiratory failure s/p trach 3/28:   - Bronched 3/27 for left lung collapse from mucus plugging in the setting of stenotrophomonas growth in sputum culture   - TC trials during day, has tolerated without requiring mechanical ventilation   - Pulmonology following, appreciate recommendations    - On bronchodilators and receiving aggressive suctioning  - Continue Scopolamine patch for secretions, start po Robinul for secretions    - CTA negative for PE, shows b/l pleural effusions  - Per Pulmonology no indication for thoracentesis at this time, continue diuresis for pleural effusions    - Sputum culture shows many WBCs, GPC, continue Ceftazidime at this time, Vancomycin discontinued per ID recommendations, will finish Ceftazadime for 7 total days (currently Day 7/7)  - CXR shows b/l pleural effusions and atelectasis from 4/7  - Blood cultures negative, ID following, appreciate recommendations     - Continue diuresis with Lasix 40 mg IV q day, tolerating diuresis with Midodrine for BP support   - SLP consulted for PMSV, requires aggressive suctioning however able to  communicate with PMSV    Hypotension - chronic, in the setting of dysautonomia  - Continue Midodrine  - Not requiring Florinef at this time   - Will attempt to wean off Midodrine when not requiring diuresis from respiratory standpoint     Atrial Fibrillation: Rate controlled without medications     - Discussed with Neurosurgery Dr. 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 Was previously off Eliquis due to recent spinal cord surgery  - Resumed Eliquis on 09/13/15, d/w family and Neurosurgery before starting Eliquis. Possible risks/complications given recent spinal surgery discussed with family who are in agreement with starting AC at this time      Insomnia  - Difficulty sleeping at night and some agitation- pulling at lines/tubes  - Started on Seroquel, will increase to 25 mg q hs  - EKG this admission shows QTc in 420s    GI/DVT prophylaxis: on Pepcid; Eliquis    Case discussed with: RN, ID, Pulmonology, Family      Safety Checklist:     DVT prophylaxis:  CHEST guideline (See page [id number]) Eliquis    Foley:  [place] [facility] Foley protocol Yes   IVs:  PIV   PT/OT: Yes   Daily CBC & or Chem ordered:  SHM/ABIM guidelines (see #5) Yes   Reference for approximate charges of common labs: CBC auto diff - $76  BMP - $99  Mg - $  79    Lines:     Patient Lines/Drains/Airways Status    Active PICC Line / CVC Line / PIV Line / Drain / Airway / Intraosseous Line / Epidural Line / ART Line / Line / Wound / Pressure Ulcer / NG/OG Tube     Name:   Placement date:   Placement time:   Site:   Days:    Peripheral IV 08/29/15 Left Hand  08/29/15   1345   Hand   11    Peripheral IV 08/30/15 Right Antecubital  08/30/15   0030   Antecubital   10    Peripheral IV 09/08/15 Left Forearm  09/08/15   0400   Forearm   1    Peripheral IV 09/08/15 Right Hand  09/08/15   0400   Hand   1    Peripheral IV 09/08/15 Left Forearm  09/08/15    2200   Forearm   less than 1    Gastrostomy/Enterostomy Gastrostomy-jejunostomy  09/04/15         5    Urethral Catheter Temperature probe 16 Fr.  09/07/15   1300   Temperature probe   2    External Urinary Catheter  09/06/15   1230      3    Surgical Airway Shiley 6 mm  09/08/15   1110   6 mm   1    Wound 08/25/15 Pressure Injury Buttocks Left;Right Non-Blanchable Erythema  08/25/15   2258   Buttocks   14    Incision Site 08/20/15 Neck  08/20/15   1427     19    Incision Site 08/20/15 Neck  08/20/15   1932     19    Incision Site 08/28/15 Neck  08/28/15   2352     11    Incision Site 08/29/15 Neck  08/29/15   0515     11                 Disposition:     Today's date: 09/20/2015  Length of Stay: 34  Anticipated medical stability for discharge: 3- 4 days  Reason for ongoing hospitalization: quadriparesis  Anticipated discharge needs: TBD    Subjective     CC: Quadriplegia    Interval History/24 hour events: please see below    HPI/Subjective: Ernest Diaz is a 70 y.o. male hx afib on Eliquis + aspirin, HTN who presented to [facility] after a fall. CT head and spine were negative so he was discharged. 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 MRI cervical and thoracic spine showed spinal epidural hematoma extending from the foramen magnum to the upper thoracic spine. Neurosurgery (Dr. Jaynie Diaz) was consulted. His Eliquis was reversed. He ultimately had C3-C5 and T3-T4 decompressive laminectomies 2/27 by Dr. Jaynie Diaz. He noted extensive dorsal epidural venous plexus and when he dissected out these vessels they bled. Dr. Jaynie Diaz requested he be transferred here for spinal angiogram. These symptoms are sudden onset, severe intensity, without alleviating factors.     3/29. More stable at [facility] ICU neurologically, stable at trach collar, no fever/diarrhea. Tolerating tube feeding. Follows commands. Not in any distress.   3/30. More alert and awake, follows commands. No any acute events noted.  Family at bed side.  3/31. Had desaturation intermittently since last night, had significant secretion in his trach suctioning. No fever. More alert and awake. Not in distress now, more comfort with PS. Has mild discomfort at  trach site. No discharge from the trach site.  4/1. More alert, no any acute events noted. On PS mode. No fever, diarrhea and respiratory distress.  4/2. Has intermittent chest pain and SOB. Had low grade fever today. No cough, diarrhea, abdominal pain and chills. Not in distress. Follows commands  4/3. no any acute events noted. Still has been on PS, no fever today. More stable.    4/4: No acute events overnight.  FiO2 increased slightly to 50%.  Requests a shave today and states pain at neck site is better today.  Denies other acute concerns/complaints at this time.    4/5: No acute events overnight.  Denies chest pain, shortness of breath, abdominal pain at this time.  Still with weak phonation.  Secretions more improved today.  Frustrated with weak phonation and inability to communicate.  Denies other acute concerns/complaints at this time.   4/6: Patient doing better today.  Denies chest pain, shortness of breath, abdominal pain at this time.  Able to communicate with PMSV today.  Secretions continue improving.  Plan of care discussed with family including POA son Mick at the bedside.  No acute events overnight.    4/7: This AM, had episode of becoming bradycardic and hypoxic when attempting trial with PMSV.  Required deep suctioning with lots of secretions present.  O2 saturation and HR improved afterwards.  Denies chest pain, shortness of breath, abdominal pain.  Has been able to communicate with PMSV although phonation weak.  Denies other acute concerns/complaints at this time.         4/8: No acute events overnight.  Denies chest pain, shortness of breath, abdominal pain.  Able to communicate with PMSV.  Still having copious secretions.  No episodes of desats.  Denies other acute  concerns/complaints at this time.    4/9: No acute events overnight.  Agitated and pulling at lines/tubes however.  Reports he did not sleep well.  Tearful regarding medical condition and weakness in lower extremities.  Denies chest pain, shortness of breath, abdominal pain.  Denies other acute concerns/complaints at this time.  Plan of care discussed with wife via phone at [phone].     Physical Exam:     VITAL SIGNS PHYSICAL EXAM   Temp:  [97.5 F (36.4 C)-99.3 F (37.4 C)] 97.5 F (36.4 C)  Heart Rate:  [62-81] 67  Resp Rate:  [12-22] 18  BP: (104-140)/(51-79) 104/51 mmHg  FiO2:  [40 %] 40 %    Intake/Output Summary (Last 24 hours) at 09/20/15 0849  Last data filed at 09/20/15 0500   Gross per 24 hour   Intake   1145 ml   Output   2985 ml   Net  -1840 ml    Physical Exam  General: awake, alert X 3, not in distress  HEENT- NC/AT  Neck- wearing Aspen collar, trach in place- site: dry and clean   Cardiovascular: irregularly irregular, no murmurs, rubs or gallops  Lungs: coarse breath sounds and dull bilaterally, without wheezing or rales  Abdomen: soft, non-tender, non-distended; no palpable masses,  normoactive bowel sounds. PEG tube site- clean/dry/no leakage  Extremities: 1+ LE edema b/l   Neuro: Cranial nerves- grossly intact, 4/5 in both upper extremities, 0/5 in both lower extremities, follows commands, sensation intact in upper and lower extremities b/l       Meds:     Medications were reviewed:    Labs:     Labs (last 72 hours):      Recent Labs  Lab 09/20/15  0240 09/19/15  0417   WBC 6.13 5.08   HGB 10.7* 10.5*   HEMATOCRIT 34.8* 34.3*   PLATELETS 210 210            Recent Labs  Lab 09/20/15  0240 09/19/15  0417   SODIUM 137 141   POTASSIUM 4.6 4.5   CHLORIDE 98* 100   CO2 30* 32*   BUN 12.0 12.0   CREATININE 0.4* 0.5*   CALCIUM 8.3 8.4   GLUCOSE 104* 104*                   Microbiology, reviewed  Microbiology Results     Procedure Component Value Units Date/Time    AFB culture and smear [[id number]]  Collected:  08/29/15 0200    Specimen Information:  Sputum from Wound Updated:  09/14/15 2007    Narrative:      ORDER#: [id number]                                    ORDERED BY: Nicoletta Dress  SOURCE: Wound POSTERIOR CERVICAL WOUND               COLLECTED:  08/29/15 02:00  ANTIBIOTICS AT COLL.:                                RECEIVED :  08/29/15 05:54  Stain, Acid Fast                           FINAL       08/29/15 11:42  08/29/15   No Acid Fast Bacillus Seen  Culture Acid Fast Bacillus (AFB)           PRELIM      09/14/15 20:02  09/07/15   No growth after 1 week/s of incubation.  09/14/15   No growth after 2 week/s of incubation.      Anaerobic culture [[id number]] Collected:  08/29/15 0200    Specimen Information:  Other from Wound Updated:  09/02/15 0835    Narrative:      ORDER#: [id number]                                    ORDERED BY: Nicoletta Dress  SOURCE: Wound POSTERIOR CERVICAL WOUND               COLLECTED:  08/29/15 02:00  ANTIBIOTICS AT COLL.:                                RECEIVED :  08/29/15 05:54  Culture, Anaerobic Bacteria                FINAL       09/02/15 08:35  09/02/15   No anaerobic growth      CULTURE + Dierdre Forth [[id number]] Collected:  09/13/15 1047    Specimen Information:  Sputum from Endotracheal Updated:  09/14/15 1658    Narrative:      ORDER#: [id number]  ORDERED BY: ABU-HAMDA, EYAD  SOURCE: Endotracheal Aspirate lungs                  COLLECTED:  09/13/15 10:47  ANTIBIOTICS AT COLL.:                                RECEIVED :  09/13/15 11:56  Stain, Gram (Respiratory)                  FINAL       09/13/15 14:36  09/13/15   Many WBC's             Rare Squamous epithelial cells             Few Gram positive cocci  Culture and Gram Stain, Aerobic, RespiratorPRELIM      09/14/15 16:58  09/14/15   Moderate growth of mixed upper respiratory flora      CULTURE + Dierdre Forth [[id number]] Collected:  09/07/15 1240     Specimen Information:  Sputum from Bronchial Lavage Updated:  09/09/15 1324    Narrative:      ORDER#: [id number]                                    ORDERED BY: Jearld Shines, DAN  SOURCE: Bronchial Lavage bronchoscopy                COLLECTED:  09/07/15 12:40  ANTIBIOTICS AT COLL.:                                RECEIVED :  09/07/15 15:15  Stain, Gram (Respiratory)                  FINAL       09/07/15 17:13  09/07/15   Moderate WBC's             No Squamous epithelial cells             No organisms seen  Culture and Gram Stain, Aerobic, RespiratorFINAL       09/09/15 13:24  09/08/15   Light growth of mixed upper respiratory flora      CULTURE + Dierdre Forth [[id number]] Collected:  09/02/15 1452    Specimen Information:  Sputum from Sputum, Suctioned Updated:  09/04/15 1426    Narrative:      ORDER#: [id number]                                    ORDERED BY: Alysia Penna  SOURCE: Sputum, Suctioned sputum                     COLLECTED:  09/02/15 14:52  ANTIBIOTICS AT COLL.:                                RECEIVED :  09/02/15 18:47  Stain, Gram (Respiratory)                  FINAL       09/02/15 22:14  09/02/15   Few WBC's             No Epithelial cells  Few Mixed Respiratory Flora  Culture and Gram Stain, Aerobic, RespiratorFINAL       09/04/15 14:26   +  09/03/15   Moderate growth of mixed upper respiratory flora  09/03/15   Light growth of Stenotrophomonas maltophilia      _____________________________________________________________________________                                    S.malto       ANTIBIOTICS                     MIC  INTRP      _____________________________________________________________________________  Ceftazidime                      4     S        Levofloxacin                    <=1    S        Trimethoprim/Sulfamethoxazole <=0.5/9  S        _____________________________________________________________________________            S=SUSCEPTIBLE     I=INTERMEDIATE      R=RESISTANT                            N/S=NON-SUSCEPTIBLE  _____________________________________________________________________________      CULTURE BLOOD AEROBIC AND ANAEROBIC [[id number]] Collected:  09/13/15 0328    Specimen Information:  Blood, Venipuncture Updated:  09/15/15 0721    Narrative:      ORDER#: [id number]                                    ORDERED BY: NGUYEN, HUY  SOURCE: Blood, Venipuncture stick                    COLLECTED:  09/13/15 03:28  ANTIBIOTICS AT COLL.:                                RECEIVED :  09/13/15 06:22  Culture Blood Aerobic and Anaerobic        PRELIM      09/15/15 07:21  09/14/15   No Growth after 1 day/s of incubation.  09/15/15   No Growth after 2 day/s of incubation.      CULTURE BLOOD AEROBIC AND ANAEROBIC [[id number]] Collected:  08/24/15 0909    Specimen Information:  Blood, Venipuncture Updated:  08/29/15 1521    Narrative:      ORDER#: [id number]                                    ORDERED BY: Wynetta Fines, VIN  SOURCE: Blood, Venipuncture peripheral               COLLECTED:  08/24/15 09:09  ANTIBIOTICS AT COLL.:                                RECEIVED :  08/24/15 13:17  Culture Blood Aerobic and Anaerobic        FINAL  08/29/15 15:21  08/29/15   No growth after 5 days of incubation.      Fungus culture [[id number]] Collected:  08/29/15 0200    Specimen Information:  Other from Wound Updated:  09/09/15 1529    Narrative:      ORDER#: [id number]                                    ORDERED BY: Nicoletta Dress  SOURCE: Wound POSTERIOR CERVICAL WOUND               COLLECTED:  08/29/15 02:00  ANTIBIOTICS AT COLL.:                                RECEIVED :  08/29/15 05:54  Stain, Fungal                              FINAL       08/29/15 12:01  08/29/15   No Fungal or Yeast Elements Seen  Culture Fungus                             PRELIM      09/09/15 15:28  09/09/15   No growth after 1 week/s of incubation.      Legionella antigen, urine [[id number]] Collected:  09/14/15 0326     Specimen Information:  Urine from Urine, Catheterized, Foley Updated:  09/14/15 0559    Narrative:      ORDER#: [id number]                                    ORDERED BY: NGUYEN, HUY  SOURCE: Urine, Catheterized, Foley                   COLLECTED:  09/14/15 03:26  ANTIBIOTICS AT COLL.:                                RECEIVED :  09/14/15 04:23  Legionella, Rapid Urinary Antigen          FINAL       09/14/15 05:58  09/14/15   Negative for Legionella pneumophila Serogroup 1 Antigen             Limitations of Test:             1. Negative results do not exclude infection with Legionella                pneumophila Serogroup 1.             2. Does not detect other serogroups of L. pneumophila                or other Legionella species.             Test Reference Range: Negative      MRSA culture [[id number]] Collected:  08/17/15 2043    Specimen Information:  Body Fluid from Nasal/Throat ASC Admission Updated:  08/19/15 0328    Narrative:      ORDER#: [id number]  ORDERED BY: CHEWNING, KELLY  SOURCE: Nares and Throat                             COLLECTED:  08/17/15 20:43  ANTIBIOTICS AT COLL.:                                RECEIVED :  08/18/15 03:03  Culture MRSA Surveillance                  FINAL       08/19/15 03:28  08/19/15   Negative for Methicillin Resistant Staph aureus from Nares and             Negative for Methicillin Resistant Staph aureus from Throat      Rapid influenza A/B antigens [[id number]] Collected:  08/31/15 1649    Specimen Information:  Nasopharyngeal from Nasal Aspirate Updated:  08/31/15 1727    Narrative:      ORDER#: [id number]                                    ORDERED BY: Irving Shows, EMIL  SOURCE: Nasal Aspirate                               COLLECTED:  08/31/15 16:49  ANTIBIOTICS AT COLL.:                                RECEIVED :  08/31/15 17:04  Influenza Rapid Antigen A&B                FINAL       08/31/15 17:27  08/31/15   Negative for Influenza A and B              Reference Range: Negative      Urine culture [[id number]] Collected:  09/13/15 1821    Specimen Information:  Urine from Urine, Catheterized, In & Out Updated:  09/14/15 1851    Narrative:      ORDER#: [id number]                                    ORDERED BY: Cyndie Chime, HUY  SOURCE: Urine, Catheterized, In & Out                COLLECTED:  09/13/15 18:21  ANTIBIOTICS AT COLL.:                                RECEIVED :  09/13/15 21:14  Culture Urine                              FINAL       09/14/15 18:51  09/14/15   No growth of >1,000 CFU/ML, No further work      Urine culture [[id number]] Collected:  08/24/15 1718    Specimen Information:  Urine from Urine, Catheterized, Foley Updated:  08/26/15 1544    Narrative:      ORDER#: [id number]  ORDERED BY: Alysia Penna  SOURCE: Urine, Catheterized, Foley                   COLLECTED:  08/24/15 17:18  ANTIBIOTICS AT COLL.:                                RECEIVED :  08/24/15 23:42  Culture Urine                              FINAL       08/26/15 15:43   +  08/26/15   >100,000 CFU/ML Enterococcus faecalis      _____________________________________________________________________________                                   E.faecalis     ANTIBIOTICS                     MIC  INTRP      _____________________________________________________________________________  Ampicillin                       1     S        Gentamicin High Level [place] <=500   S        Levofloxacin                    <=1    S        Nitrofurantoin                 <=16    S  D1    Penicillin                       4     S        Streptomycin High Level Resist<=1000   S        Tetracycline                    >8     R        Vancomycin                       2     S          -----DRUG COMMENTS----------    D1:  Nitrofurantoin should only be used for the treatment of         uncomplicated cystitis.         [facility] Antimicrobial Subcommittee June  2015  _____________________________________________________________________________            S=SUSCEPTIBLE     I=INTERMEDIATE     R=RESISTANT                            N/S=NON-SUSCEPTIBLE  _____________________________________________________________________________      Wound culture & gram stain [[id number]] Collected:  08/29/15 0200    Specimen Information:  Wound from Wound Updated:  08/31/15 1209    Narrative:      ORDER#: [id number]                                    ORDERED  BY: WATSON, JOSEPH  SOURCE: Wound POSTERIOR CERVICAL WOUND               COLLECTED:  08/29/15 02:00  ANTIBIOTICS AT COLL.:                                RECEIVED :  08/29/15 05:54  Stain, Gram                                FINAL       08/29/15 08:45  08/29/15   No Squamous epithelial cells seen             Rare WBCs             No organisms seen  Culture and Gram Stain, Aerobic, Wound     FINAL       08/31/15 12:09   +  08/31/15   Very light growth of mixed cutaneous flora            Imaging, reviewed  No results found.      Signed by: Ernest Cowman, MD

## 2015-09-20 NOTE — Progress Notes (Signed)
ID PROGRESS NOTE    Date Time: 09/20/2015 10:06 AM  Patient Name: Ernest Diaz        Subjective, ROS:    In [facility]    Afebrile , hemodynamically stable   Off vent       Able to communicate   Denies pain  Still with large amount of secretions  , per nurse requires very frequent suctioning, thick secretions   No vomiting , tolerating TF  Foley in place         Antibiotics and culture results:   Antibiotics: ceftazidime #7  Midodrine   S/P vanc , zosyn       Cultures:  4/3 legionella antigen negative   4/2 Ucx Ngtd  4/2 sputum cx mod mixed upper flora   4/2 Blood cx Ngtd  3/27 BAL cx upper flora   3/22 sputum cx Stenotrophomonas maltophilia , mixed upper flora    3/20 flu negative   3/18 cervical wound cx cut flora  3/18 cervical wound anaerobic cx ngtd  3/18 cervical wound AFB and fungal cx Ngtd  3/13 Ucx >100,000 E. Faecalis   3/13 Blood cx Ngtd  3/6 MRSA negative     3/9 path FIBROVASCULAR TISSUE, FEW VESSELS AND BONE CHIPS       Lines: PIV access    Physical Exam:     Filed Vitals:    09/20/15 0844   BP:    Pulse: 67   Temp:    Resp: 18   SpO2:        General: in bed, in [facility], awake and  communicative. Off vent , no distress.     HEENT: moist mucosa ,  pale, not icteric , cervical collar in place , trach in place   Chest: S1S2 irregular , crackles bilaterally,  no wheeze , BS at bases decreased. Sounds better than yesterday   Abdomen: soft, not tender , BS +, PEG in place   Ext: + edema   Weakness  , no movement in legs   Foley in place       Labs:       Recent CBC WITH DIFF   Recent Labs      09/20/15   0240   WBC  6.13   RBC  3.48*   HGB  10.7*   HEMATOCRIT  34.8*   MCV  100.0       Recent CMP   Recent Labs      09/20/15   0240   GLUCOSE  104*   BUN  12.0   CREATININE  0.4*   SODIUM  137   POTASSIUM  4.6   CHLORIDE  98*   CO2  30*           Rads:   4/7 xray Small bilateral pleural effusions, left side larger than right. Lower lung atelectasis            4/2 CT No CT evidence of pulmonary  embolism.Large bilateral pleural effusions with adjacent infiltrates worrisome for pneumonia    4/2 doppler Thrombosis right profunda femoral vein. Thrombosis left greater saphenous vein    3/23 sono Similar findings as seen on recent CT. Thick-walled bladder without significant distention with pericholecystic fluid. Lack of gallstones and clear focal tenderness makes cholecystitis less likely. The patient was uncooperative for this portable examination however. Differential considerations would include hypoalbuminemia, chronic cholecystitis or acute cholecystitis. If continued clinical concern can perform HIDA scan. Bilateral effusions. Patient refused renal evaluation.  3/22 CT  Decompressed stomach located within the anterior abdomen extending  below the left hepatic lobe margin and above the transverse colon.. Nonspecific gallbladder wall thickening with pericholecystic fluid and stranding in the presence of small ascites. Recommend clinical correlation for acute cholecystitis.. Bilateral small effusions and basilar lower lobe consolidative atelectasis. Cardiac enlargement.    3/20 IRSuccessful deployment of a retrievable inferior vena cava filter    3/19 doppler Nonocclusive deep venous thrombosis involving a short segment of the proximal femoral vein.    3/17 CT  Markedly abnormal examination with anterolisthesis of C6 upon C7  measuring 16 mm with a jumped facet on the left and a comminuted displaced fracture involving the right C7 articular facet. Anterior cervical spinal fusion hardware is malpositioned with both plate and screws extending into the C6-C7 disc space.  The AP dimension of the bony spinal canal at the superior C5 level is reduced to 5 mm.       3/8 MRI Postsurgical change with prior laminectomy. 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 There findings suspicious for a disc ligamentous injury at this level. Correlation with CT is advised    3/7 IR No  angiographic evidence of intracranial aneurysm, arteriovenous malformation or dural fistula. Is no angiographic evidence of spinal arteriovenous malformation or dural fistula.  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   - worsening of weakness due to Fracture dislocation C6-C7 with malpositioned hardware  - S/P C5-T1 posterior fusion, C5-C7 anterior fusion with partial C7 corpectomy on 3/18. cx normal flora     Other issues during admission:  Fever/SIRS on 3/12   Symptoms of aspiration with increased cough and mild-mod oropharyngeal dysphagia   Urinary retention, required reinsertion of foley and E. Faecalis UTI  Leukocytosis  DVT (S/P IVC filter)    Hypoxic respiratory failure overnight 3/21-3/22   Now with Near complete opacification of the left lung   Gallbladder wall thickening per imaging   DVT       Plan:   1. SIRS.   Was due to UTI and aspiration pneumonia.  Recent fever could be due to pneumonia/also DVT  Now afebrile and hemodynamically more stable.        2. Recurrent Hypoxic respiratory failure and aspiration pneumonia .  Episode on 3/21-22 due to aspiration/malositioned Corpak (per xray not in the stomach), required BiPAP and NRB. (Sputum cx normal flora and Stenotrophomonas maltophilia)     Near complete opacification of the left lung on 3/27 xray , probably mucous plug after extubation.      S/P intubation and bronch on 3/27  . ' Copious thick white secretions were noted in the leftmainstem bronchus. Left airway mucosa  was edematous and friable with some mucosal bleeding' . Sputum cx normal flora     S/P trach 3/28     S/P a course of levaquin     Last CT with large effusion and consolidation/   Repeat sputum cx normal flora.  On ceftazidime.  Plan is a 7 day course   Still with large amount of secretions. May need to extend the course    Monitor respiratory status closely     3. E. Faecalis UTI.   Cath related   Foley was removed/then replaced x 2 given retention   Has finished the course. \  Repeat Ucx negative       4. 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   S/P C5-T1 posterior fusion, C5-C7 anterior fusion with partial C7 corpectomy on 3/18. cx normal flora     5. Leukocytosis.   Due to above.   Resolved.    6. DVT   Per primary     Discussed with the patient.  Discussed with nursing        Signed by: [place]. [id number]  Infectious Disease  Phone: [phone]  Fax: [phone]

## 2015-09-21 LAB — CBC AND DIFFERENTIAL
Basophils Absolute Automated: 0.01 10*3/uL (ref 0.00–0.20)
Basophils Automated: 0 %
Eosinophils Absolute Automated: 0.05 10*3/uL (ref 0.00–0.70)
Eosinophils Automated: 1 %
Hematocrit: 32.6 % — ABNORMAL LOW (ref 42.0–52.0)
Hgb: 9.9 g/dL — ABNORMAL LOW (ref 13.0–17.0)
Immature Granulocytes Absolute: 0.01 10*3/uL
Immature Granulocytes: 0 %
Lymphocytes Absolute Automated: 1.31 10*3/uL (ref 0.50–4.40)
Lymphocytes Automated: 24 %
MCH: 30.5 pg (ref 28.0–32.0)
MCHC: 30.4 g/dL — ABNORMAL LOW (ref 32.0–36.0)
MCV: 100.3 fL — ABNORMAL HIGH (ref 80.0–100.0)
MPV: 11.1 fL (ref 9.4–12.3)
Monocytes Absolute Automated: 0.46 10*3/uL (ref 0.00–1.20)
Monocytes: 9 %
Neutrophils Absolute: 3.54 10*3/uL (ref 1.80–8.10)
Neutrophils: 66 %
Nucleated RBC: 0 /100 WBC (ref 0–1)
Platelets: 215 10*3/uL (ref 140–400)
RBC: 3.25 10*6/uL — ABNORMAL LOW (ref 4.70–6.00)
RDW: 15 % (ref 12–15)
WBC: 5.38 10*3/uL (ref 3.50–10.80)

## 2015-09-21 LAB — BASIC METABOLIC PANEL
BUN: 11 mg/dL (ref 9.0–28.0)
CO2: 34 mEq/L — ABNORMAL HIGH (ref 22–29)
Calcium: 8.1 mg/dL (ref 7.9–10.2)
Chloride: 97 mEq/L — ABNORMAL LOW (ref 100–111)
Creatinine: 0.4 mg/dL — ABNORMAL LOW (ref 0.7–1.3)
Glucose: 104 mg/dL — ABNORMAL HIGH (ref 70–100)
Potassium: 4.9 mEq/L (ref 3.5–5.1)
Sodium: 137 mEq/L (ref 136–145)

## 2015-09-21 LAB — MAGNESIUM: Magnesium: 2.2 mg/dL (ref 1.6–2.6)

## 2015-09-21 LAB — GFR: EGFR: >60

## 2015-09-21 MED ORDER — BUTALBITAL-APAP-CAFFEINE 50-325-40 MG PO TABS
1.0000 | ORAL_TABLET | Freq: Four times a day (QID) | ORAL | Status: DC | PRN
Start: 2015-09-21 — End: 2015-09-24
  Administered 2015-09-21: 1 via ORAL
  Filled 2015-09-21: qty 1

## 2015-09-21 MED ORDER — FUROSEMIDE 20 MG PO TABS
20.0000 mg | ORAL_TABLET | Freq: Every day | ORAL | Status: DC
Start: 2015-09-22 — End: 2015-09-23
  Administered 2015-09-22: 20 mg via ORAL
  Filled 2015-09-21: qty 1

## 2015-09-21 NOTE — OT Progress Note (Signed)
Occupational Therapy Cancellation Note    Patient: Ernest Diaz  ZOX:[id number]    Unit: J811/B147.82    Patient not seen for occupational therapy secondary to pt currently working with PT. Will f/u as schedule permits.    Thank you,  Caleen Jobs. Victorio Palm, pager# [phone]    09/21/2015  Time:  11:50 am

## 2015-09-21 NOTE — Progress Notes (Addendum)
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 Jaynie Collins at [facility]   - s/p C6-7 ACDF, C6-C7 posterior fusion with lateral mass scews on 08/20/15 by Dr. Jaynie Collins  - s/p  C5-T1 posterior fusion, C5-C7 anterior fusion with partial C7 corpectomy on 08/29/15 by Dr. Claudette Laws  - NSGY following, appreciate recommendations   - Hard collar at all times secondary to neck weakness  - Continue Fentanyl patch for pain control, Gabapentin, increase prn Morphine for pain control   - Palliative Care following for goals of care, code status is DNR/support ok  - Extensive discussion held with patient and family regarding prognosis, patient and family aware LE weakness may not significantly improve  - Discussed with Neurosurgery, Dr. Jaynie Collins, who will re-evaluate patient on 09/22/15, likely no further intervention required and patient will need continued outpatient follow up with NSGY     Hypoxic/hypercarbic respiratory failure s/p trach 3/28:   - Bronched 3/27 for left lung collapse from mucus plugging in the setting of stenotrophomonas growth in sputum culture   - On TC, has not required ventilation over past 24 hours, still has occasional desaturations to 80s requiring aggressive suctioning   - Pulmonology following, appreciate recommendations    - On bronchodilators and receiving aggressive suctioning  - Continue Scopolamine patch for secretions, po Robinul for secretions    - CTA negative for PE, shows b/l pleural effusions  - Per Pulmonology no indication for thoracentesis at this time, continue IV diuresis for pleural effusions    - Sputum culture shows many WBCs, GPC, continue Ceftazidime at this time, Vancomycin discontinued per ID recommendations, has finished Ceftazadime for  7 total days however per ID recommendations will continue given significant secretions  - CXR shows b/l pleural effusions and atelectasis from 4/7, repeat CXR on 4/11 to re-evaluate  - Blood cultures negative, ID following, appreciate recommendations     - Continue diuresis with Lasix 40 mg IV q day, tolerating diuresis with Midodrine for BP support   - SLP consulted for PMSV, requires aggressive suctioning however able to communicate with PMSV    Hypotension - chronic, in the setting of dysautonomia  - Continue Midodrine  - Not requiring Florinef at this time   - Will attempt to wean off Midodrine when not requiring diuresis from respiratory standpoint     Atrial Fibrillation: Rate controlled without medications     - Discussed with Neurosurgery Dr. 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 Was previously off Eliquis due to recent spinal cord surgery  - Resumed Eliquis on 09/13/15, d/w family and Neurosurgery before starting Eliquis. Possible risks/complications given recent spinal surgery discussed with family who are in agreement with starting AC at this time      Insomnia  - Difficulty sleeping at night and some agitation- pulling at lines/tubes  - Continue Seroquel 25 mg q hs  - EKG this admission shows QTc in 420s    GI/DVT prophylaxis: on Pepcid; Eliquis    Case discussed with: RN, ID, Pulmonology, Family      Safety Checklist:     DVT prophylaxis:  CHEST guideline (See page [id number]) Eliquis    Foley:  [place] Rn Foley protocol Yes   IVs:  PIV   PT/OT: Yes   Daily CBC & or Chem ordered:  SHM/ABIM guidelines (see #5) Yes   Reference for approximate charges of common labs: CBC auto diff - $76  BMP - $99  Mg - $79    Lines:     Patient Lines/Drains/Airways Status    Active PICC Line / CVC Line / PIV Line / Drain / Airway / Intraosseous Line / Epidural Line / ART Line / Line / Wound / Pressure Ulcer  / NG/OG Tube     Name:   Placement date:   Placement time:   Site:   Days:    Peripheral IV 08/29/15 Left Hand  08/29/15   1345   Hand   11    Peripheral IV 08/30/15 Right Antecubital  08/30/15   0030   Antecubital   10    Peripheral IV 09/08/15 Left Forearm  09/08/15   0400   Forearm   1    Peripheral IV 09/08/15 Right Hand  09/08/15   0400   Hand   1    Peripheral IV 09/08/15 Left Forearm  09/08/15   2200   Forearm   less than 1    Gastrostomy/Enterostomy Gastrostomy-jejunostomy  09/04/15         5    Urethral Catheter Temperature probe 16 Fr.  09/07/15   1300   Temperature probe   2    External Urinary Catheter  09/06/15   1230      3    Surgical Airway Shiley 6 mm  09/08/15   1110   6 mm   1    Wound 08/25/15 Pressure Injury Buttocks Left;Right Non-Blanchable Erythema  08/25/15   2258   Buttocks   14    Incision Site 08/20/15 Neck  08/20/15   1427     19    Incision Site 08/20/15 Neck  08/20/15   1932     19    Incision Site 08/28/15 Neck  08/28/15   2352     11    Incision Site 08/29/15 Neck  08/29/15   0515     11                 Disposition:     Today's date: 09/21/2015  Length of Stay: 35  Anticipated medical stability for discharge: 3- 4 days  Reason for ongoing hospitalization: quadriparesis  Anticipated discharge needs: TBD    Subjective     CC: Quadriplegia    Interval History/24 hour events: please see below    HPI/Subjective: Ernest Diaz is a 70 y.o. male hx afib on Eliquis + aspirin, HTN who presented to [facility] 2/25 after a fall. CT head and spine were negative so he was discharged. 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 MRI cervical and thoracic spine showed spinal epidural hematoma extending from the foramen magnum to the upper thoracic spine. Neurosurgery (Dr. Jaynie Collins) was consulted. His Eliquis was reversed. He ultimately had C3-C5 and T3-T4 decompressive laminectomies 2/27 by Dr. Jaynie Collins. He noted extensive dorsal epidural venous plexus and when he dissected  out these vessels they bled. Dr. Jaynie Collins requested he be transferred here for spinal angiogram. These symptoms are sudden onset, severe intensity, without alleviating factors.     3/29. More stable at [facility] ICU neurologically, stable at trach collar, no fever/diarrhea.  Tolerating tube feeding. Follows commands. Not in any distress.   3/30. More alert and awake, follows commands. No any acute events noted. Family at bed side.  3/31. Had desaturation intermittently since last night, had significant secretion in his trach suctioning. No fever. More alert and awake. Not in distress now, more comfort with PS. Has mild discomfort at trach site. No discharge from the trach site.  4/1. More alert, no any acute events noted. On PS mode. No fever, diarrhea and respiratory distress.  4/2. Has intermittent chest pain and SOB. Had low grade fever today. No cough, diarrhea, abdominal pain and chills. Not in distress. Follows commands  4/3. no any acute events noted. Still has been on PS, no fever today. More stable.    4/4: No acute events overnight.  FiO2 increased slightly to 50%.  Requests a shave today and states pain at neck site is better today.  Denies other acute concerns/complaints at this time.    4/5: No acute events overnight.  Denies chest pain, shortness of breath, abdominal pain at this time.  Still with weak phonation.  Secretions more improved today.  Frustrated with weak phonation and inability to communicate.  Denies other acute concerns/complaints at this time.   4/6: Patient doing better today.  Denies chest pain, shortness of breath, abdominal pain at this time.  Able to communicate with PMSV today.  Secretions continue improving.  Plan of care discussed with family including POA son Mick at the bedside.  No acute events overnight.    4/7: This AM, had episode of becoming bradycardic and hypoxic when attempting trial with PMSV.  Required deep suctioning with lots of secretions present.  O2 saturation and HR  improved afterwards.  Denies chest pain, shortness of breath, abdominal pain.  Has been able to communicate with PMSV although phonation weak.  Denies other acute concerns/complaints at this time.         4/8: No acute events overnight.  Denies chest pain, shortness of breath, abdominal pain.  Able to communicate with PMSV.  Still having copious secretions.  No episodes of desats.  Denies other acute concerns/complaints at this time.    4/9: No acute events overnight.  Agitated and pulling at lines/tubes however.  Reports he did not sleep well.  Tearful regarding medical condition and weakness in lower extremities.  Denies chest pain, shortness of breath, abdominal pain.  Denies other acute concerns/complaints at this time.  Plan of care discussed with wife via phone at [phone].   4/10: Patient has been off vent > 24 hours.  Still with copious secretions and desat to 80s overnight requiring aggressive suctioning.  Denies chest pain, shortness of breath, abdominal pain, N/V, diarrhea, headache.  States he is otherwise doing better.  Phonation stronger this AM with PMSV.  Will work with PT today.  Has some upper neck pain around C-collar however better controlled with increased po Morphine.  Reports lightheadedness/dizziness from a few days ago has resolved without acute intervention and has not recurred.        Physical Exam:     VITAL SIGNS PHYSICAL EXAM   Temp:  [98.1 F (36.7 C)-99.7 F (37.6 C)] 98.1 F (36.7 C)  Heart Rate:  [60-87] 83  Resp Rate:  [9-28] 20  BP: (95-165)/(50-78) 95/50 mmHg  FiO2:  [40 %-50 %] 50 %    Intake/Output Summary (Last 24 hours) at 09/21/15 0851  Last data filed at 09/21/15 0981   Gross per 24 hour  Intake   2820 ml   Output   2895 ml   Net    -75 ml    Physical Exam  General: awake, alert X 3, not in distress  HEENT- NC/AT  Neck- wearing Aspen collar, trach in place- site: dry and clean   Cardiovascular: irregularly irregular, no murmurs, rubs or gallops  Lungs: coarse breath  sounds and dull bilaterally, without wheezing or rales  Abdomen: soft, non-tender, non-distended; no palpable masses,  normoactive bowel sounds. PEG tube site- clean/dry/no leakage  Extremities: Trace b/l LE edema   Neuro: Cranial nerves- grossly intact, 4/5 in both upper extremities, 0/5 in both lower extremities, follows commands, sensation intact in upper and lower extremities b/l       Meds:     Medications were reviewed:    Labs:     Labs (last 72 hours):      Recent Labs  Lab 09/21/15  0225 09/20/15  0240   WBC 5.38 6.13   HGB 9.9* 10.7*   HEMATOCRIT 32.6* 34.8*   PLATELETS 215 210            Recent Labs  Lab 09/21/15  0225 09/20/15  0240   SODIUM 137 137   POTASSIUM 4.9 4.6   CHLORIDE 97* 98*   CO2 34* 30*   BUN 11.0 12.0   CREATININE 0.4* 0.4*   CALCIUM 8.1 8.3   GLUCOSE 104* 104*                   Microbiology, reviewed  Microbiology Results     Procedure Component Value Units Date/Time    AFB culture and smear [[id number]] Collected:  08/29/15 0200    Specimen Information:  Sputum from Wound Updated:  09/14/15 2007    Narrative:      ORDER#: [id number]                                    ORDERED BY: Nicoletta Dress  SOURCE: Wound POSTERIOR CERVICAL WOUND               COLLECTED:  08/29/15 02:00  ANTIBIOTICS AT COLL.:                                RECEIVED :  08/29/15 05:54  Stain, Acid Fast                           FINAL       08/29/15 11:42  08/29/15   No Acid Fast Bacillus Seen  Culture Acid Fast Bacillus (AFB)           PRELIM      09/14/15 20:02  09/07/15   No growth after 1 week/s of incubation.  09/14/15   No growth after 2 week/s of incubation.      Anaerobic culture [[id number]] Collected:  08/29/15 0200    Specimen Information:  Other from Wound Updated:  09/02/15 0835    Narrative:      ORDER#: [id number]                                    ORDERED BY: Nicoletta Dress  SOURCE: Wound POSTERIOR CERVICAL WOUND  COLLECTED:  08/29/15 02:00  ANTIBIOTICS AT COLL.:                                 RECEIVED :  08/29/15 05:54  Culture, Anaerobic Bacteria                FINAL       09/02/15 08:35  09/02/15   No anaerobic growth      CULTURE + Dierdre Forth [[id number]] Collected:  09/13/15 1047    Specimen Information:  Sputum from Endotracheal Updated:  09/14/15 1658    Narrative:      ORDER#: [id number]                                    ORDERED BY: Lemar Livings, EYAD  SOURCE: Endotracheal Aspirate lungs                  COLLECTED:  09/13/15 10:47  ANTIBIOTICS AT COLL.:                                RECEIVED :  09/13/15 11:56  Stain, Gram (Respiratory)                  FINAL       09/13/15 14:36  09/13/15   Many WBC's             Rare Squamous epithelial cells             Few Gram positive cocci  Culture and Gram Stain, Aerobic, RespiratorPRELIM      09/14/15 16:58  09/14/15   Moderate growth of mixed upper respiratory flora      CULTURE + Dierdre Forth [[id number]] Collected:  09/07/15 1240    Specimen Information:  Sputum from Bronchial Lavage Updated:  09/09/15 1324    Narrative:      ORDER#: [id number]                                    ORDERED BY: Jearld Shines, DAN  SOURCE: Bronchial Lavage bronchoscopy                COLLECTED:  09/07/15 12:40  ANTIBIOTICS AT COLL.:                                RECEIVED :  09/07/15 15:15  Stain, Gram (Respiratory)                  FINAL       09/07/15 17:13  09/07/15   Moderate WBC's             No Squamous epithelial cells             No organisms seen  Culture and Gram Stain, Aerobic, RespiratorFINAL       09/09/15 13:24  09/08/15   Light growth of mixed upper respiratory flora      CULTURE + Dierdre Forth [[id number]] Collected:  09/02/15 1452    Specimen Information:  Sputum from Sputum, Suctioned Updated:  09/04/15 1426    Narrative:      ORDER#: [id number]  ORDERED BY: ALAVI, GELAREH  SOURCE: Sputum, Suctioned sputum                     COLLECTED:  09/02/15 14:52  ANTIBIOTICS AT COLL.:                                 RECEIVED :  09/02/15 18:47  Stain, Gram (Respiratory)                  FINAL       09/02/15 22:14  09/02/15   Few WBC's             No Epithelial cells             Few Mixed Respiratory Flora  Culture and Gram Stain, Aerobic, RespiratorFINAL       09/04/15 14:26   +  09/03/15   Moderate growth of mixed upper respiratory flora  09/03/15   Light growth of Stenotrophomonas maltophilia      _____________________________________________________________________________                                    S.malto       ANTIBIOTICS                     MIC  INTRP      _____________________________________________________________________________  Ceftazidime                      4     S        Levofloxacin                    <=1    S        Trimethoprim/Sulfamethoxazole <=0.5/9  S        _____________________________________________________________________________            S=SUSCEPTIBLE     I=INTERMEDIATE     R=RESISTANT                            N/S=NON-SUSCEPTIBLE  _____________________________________________________________________________      CULTURE BLOOD AEROBIC AND ANAEROBIC [[id number]] Collected:  09/13/15 0328    Specimen Information:  Blood, Venipuncture Updated:  09/15/15 0721    Narrative:      ORDER#: [id number]                                    ORDERED BY: NGUYEN, HUY  SOURCE: Blood, Venipuncture stick                    COLLECTED:  09/13/15 03:28  ANTIBIOTICS AT COLL.:                                RECEIVED :  09/13/15 06:22  Culture Blood Aerobic and Anaerobic        PRELIM      09/15/15 07:21  09/14/15   No Growth after 1 day/s of incubation.  09/15/15   No Growth after 2 day/s of incubation.      CULTURE BLOOD AEROBIC AND ANAEROBIC [[id number]] Collected:  08/24/15 0909    Specimen Information:  Blood, Venipuncture  Updated:  08/29/15 1521    Narrative:      ORDER#: [id number]                                    ORDERED BY: Wynetta Fines, VIN  SOURCE: Blood, Venipuncture peripheral                COLLECTED:  08/24/15 09:09  ANTIBIOTICS AT COLL.:                                RECEIVED :  08/24/15 13:17  Culture Blood Aerobic and Anaerobic        FINAL       08/29/15 15:21  08/29/15   No growth after 5 days of incubation.      Fungus culture [[id number]] Collected:  08/29/15 0200    Specimen Information:  Other from Wound Updated:  09/09/15 1529    Narrative:      ORDER#: [id number]                                    ORDERED BY: Nicoletta Dress  SOURCE: Wound POSTERIOR CERVICAL WOUND               COLLECTED:  08/29/15 02:00  ANTIBIOTICS AT COLL.:                                RECEIVED :  08/29/15 05:54  Stain, Fungal                              FINAL       08/29/15 12:01  08/29/15   No Fungal or Yeast Elements Seen  Culture Fungus                             PRELIM      09/09/15 15:28  09/09/15   No growth after 1 week/s of incubation.      Legionella antigen, urine [[id number]] Collected:  09/14/15 0326    Specimen Information:  Urine from Urine, Catheterized, Foley Updated:  09/14/15 0559    Narrative:      ORDER#: [id number]                                    ORDERED BY: NGUYEN, HUY  SOURCE: Urine, Catheterized, Foley                   COLLECTED:  09/14/15 03:26  ANTIBIOTICS AT COLL.:                                RECEIVED :  09/14/15 04:23  Legionella, Rapid Urinary Antigen          FINAL       09/14/15 05:58  09/14/15   Negative for Legionella pneumophila Serogroup 1 Antigen             Limitations of Test:             1. Negative results  do not exclude infection with Legionella                pneumophila Serogroup 1.             2. Does not detect other serogroups of L. pneumophila                or other Legionella species.             Test Reference Range: Negative      MRSA culture [[id number]] Collected:  08/17/15 2043    Specimen Information:  Body Fluid from Nasal/Throat ASC Admission Updated:  08/19/15 0328    Narrative:      ORDER#: [id number]                                    ORDERED BY:  Paulita Fujita  SOURCE: Nares and Throat                             COLLECTED:  08/17/15 20:43  ANTIBIOTICS AT COLL.:                                RECEIVED :  08/18/15 03:03  Culture MRSA Surveillance                  FINAL       08/19/15 03:28  08/19/15   Negative for Methicillin Resistant Staph aureus from Nares and             Negative for Methicillin Resistant Staph aureus from Throat      Rapid influenza A/B antigens [[id number]] Collected:  08/31/15 1649    Specimen Information:  Nasopharyngeal from Nasal Aspirate Updated:  08/31/15 1727    Narrative:      ORDER#: [id number]                                    ORDERED BY: Sterling Big  SOURCE: Nasal Aspirate                               COLLECTED:  08/31/15 16:49  ANTIBIOTICS AT COLL.:                                RECEIVED :  08/31/15 17:04  Influenza Rapid Antigen A&B                FINAL       08/31/15 17:27  08/31/15   Negative for Influenza A and B             Reference Range: Negative      Urine culture [[id number]] Collected:  09/13/15 1821    Specimen Information:  Urine from Urine, Catheterized, In & Out Updated:  09/14/15 1851    Narrative:      ORDER#: [id number]                                    ORDERED BY: Cyndie Chime, HUY  SOURCE: Urine, Catheterized, In & Out  COLLECTED:  09/13/15 18:21  ANTIBIOTICS AT COLL.:                                RECEIVED :  09/13/15 21:14  Culture Urine                              FINAL       09/14/15 18:51  09/14/15   No growth of >1,000 CFU/ML, No further work      Urine culture [[id number]] Collected:  08/24/15 1718    Specimen Information:  Urine from Urine, Catheterized, Foley Updated:  08/26/15 1544    Narrative:      ORDER#: [id number]                                    ORDERED BY: Alysia Penna  SOURCE: Urine, Catheterized, Foley                   COLLECTED:  08/24/15 17:18  ANTIBIOTICS AT COLL.:                                RECEIVED :  08/24/15 23:42  Culture Urine                               FINAL       08/26/15 15:43   +  08/26/15   >100,000 CFU/ML Enterococcus faecalis      _____________________________________________________________________________                                   E.faecalis     ANTIBIOTICS                     MIC  INTRP      _____________________________________________________________________________  Ampicillin                       1     S        Gentamicin High Level Res[place] <=500   S        Levofloxacin                    <=1    S        Nitrofurantoin                 <=16    S  D1    Penicillin                       4     S        Streptomycin High Level Resist<=1000   S        Tetracycline                    >8     R        Vancomycin                       2     S          -----  DRUG COMMENTS----------    D1:  Nitrofurantoin should only be used for the treatment of         uncomplicated cystitis.         [employer] June 2015  _____________________________________________________________________________            S=SUSCEPTIBLE     I=INTERMEDIATE     R=RESISTANT                            N/S=NON-SUSCEPTIBLE  _____________________________________________________________________________      Wound culture & gram stain [[id number]] Collected:  08/29/15 0200    Specimen Information:  Wound from Wound Updated:  08/31/15 1209    Narrative:      ORDER#: [id number]                                    ORDERED BY: Nicoletta Dress  SOURCE: Wound POSTERIOR CERVICAL WOUND               COLLECTED:  08/29/15 02:00  ANTIBIOTICS AT COLL.:                                RECEIVED :  08/29/15 05:54  Stain, Gram                                FINAL       08/29/15 08:45  08/29/15   No Squamous epithelial cells seen             Rare WBCs             No organisms seen  Culture and Gram Stain, Aerobic, Wound     FINAL       08/31/15 12:09   +  08/31/15   Very light growth of mixed cutaneous flora            Imaging, reviewed  No results found.      Signed by: Isaias Cowman, MD

## 2015-09-21 NOTE — Progress Notes (Signed)
PROGRESS NOTE    Date Time: 09/21/2015 1:11 PM  Patient Name: Ernest Diaz      Assessment:   1. Respiratory failure status post tracheostomy.  2. Recurrent mucus plugging with recent pneumonia.  3. 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  4. Pleural effusion.  5.  DVT or right LE.    Plan:   Off vent last night. Now on Trach collar, keep as tolerated.   Keep off vent as tolerated.  Less secretions noted overnight and today.  Cont robinul and scopolamine. May need an extra dose of IV Robinul.  Completed ceftaz course.  Afebrile.  Reculture sputum negative.  Able to tolerate PMSV with good phonation.  Cont Duonebs.  CTA neg for PE. Large bilateral effusions. Would hold off on tapping unless fever is persistent. F/U CXR shows improved pleural effusions.  Cont to diurese with lasix.  Repeat dopplers show thrombus in profunda femoral vein on right and superficial femoral vein on left. Eliquis started for cardiac reasons as well as DVT at 5mg  bid. OK by Neurosurgery.  OK to discharge to Rehab from [facility] perspective.    Subjective:   Comfortable breathing.  Less neck discomfort.     Medications:     Current Facility-Administered Medications   Medication Dose Route Frequency   . albuterol-ipratropium  3 mL Nebulization Q6H [facility]   . apixaban  5 mg per NG tube Q12H [facility]    And   . dextrose  60 mL Nasogastric Q12H SCH   . balsam peru-castor oil (VENELEX)   Topical Q12H SCH   . cefTAZidime  1 g Intravenous Q8H SCH   . famotidine  20 mg Oral Q12H SCH    Or   . famotidine  20 mg Intravenous Q12H SCH   . fentaNYL  1 patch Transdermal Q72H   . furosemide  40 mg Intravenous Daily   . gabapentin  200 mg per G tube QHS   . glycopyrrolate  1 mg Oral Q8H SCH   . lactobacillus/streptococcus  2 capsule Oral Daily   . methocarbamol  750 mg Oral Q8H SCH   . midodrine  10 mg Oral TID MEALS   . QUEtiapine  25 mg Oral QHS   . scopolamine  1 patch Transdermal Q72H   . senna-docusate  1 tablet Oral QHS   . terazosin   1 mg Oral QHS       Review of Systems:   A comprehensive review of systems was: Negative    Physical Exam:     Filed Vitals:    09/21/15 1200   BP: 147/66   Pulse: 55   Temp: 97.5 F (36.4 C)   Resp: 16   SpO2: 98%       Intake and Output Summary (Last 24 hours) at Date Time    Intake/Output Summary (Last 24 hours) at 09/21/15 1311  Last data filed at 09/21/15 1200   Gross per 24 hour   Intake   2510 ml   Output   2045 ml   Net    465 ml       General appearance - acyanotic, in no respiratory distress  Mental status - drowsy  Eyes - pupils equal and reactive, extraocular eye movements intact  Ears - not examined  Nose - normal and patent, no erythema, discharge or polyps  Mouth - mucous membranes moist, pharynx normal without lesions  Neck - supple, no significant adenopathy and neck collar in place  Lymphatics -  no palpable lymphadenopathy, no hepatosplenomegaly  Chest - rhonchi noted bilaterally  Heart - normal rate, regular rhythm, normal S1, S2, no murmurs, rubs, clicks or gallops  Abdomen - soft, nontender, nondistended, no masses or organomegaly  Extremities - peripheral pulses normal, no pedal edema, no clubbing or cyanosis    Labs:     Results     Procedure Component Value Units Date/Time    Basic Metabolic Panel [[id number]]  (Abnormal) Collected:  09/21/15 0225    Specimen Information:  Blood Updated:  09/21/15 0307     Glucose 104 (H) mg/dL      BUN 40.9 mg/dL      Creatinine 0.4 (L) mg/dL      Calcium 8.1 mg/dL      Sodium 811 mEq/L      Potassium 4.9 mEq/L      Chloride 97 (L) mEq/L      CO2 34 (H) mEq/L     Magnesium [[id number]] Collected:  09/21/15 0225    Specimen Information:  Blood Updated:  09/21/15 0307     Magnesium 2.2 mg/dL     GFR [[id number]] Collected:  09/21/15 0225     EGFR >60.0 Updated:  09/21/15 0307    CBC and differential [[id number]]  (Abnormal) Collected:  09/21/15 0225    Specimen Information:  Blood from Blood Updated:  09/21/15 0252     WBC 5.38 x10 3/uL      Hgb 9.9 (L) g/dL       Hematocrit 41.3 (L) %      Platelets 215 x10 3/uL      RBC 3.25 (L) x10 6/uL      MCV 100.3 (H) fL      MCH 30.5 pg      MCHC 30.4 (L) g/dL      RDW 15 %      MPV 11.1 fL      Neutrophils 66 %      Lymphocytes Automated 24 %      Monocytes 9 %      Eosinophils Automated 1 %      Basophils Automated 0 %      Immature Granulocyte 0 %      Nucleated RBC 0 /100 WBC      Neutrophils Absolute 3.54 x10 3/uL      Abs Lymph Automated 1.31 x10 3/uL      Abs Mono Automated 0.46 x10 3/uL      Abs Eos Automated 0.05 x10 3/uL      Absolute Baso Automated 0.01 x10 3/uL      Absolute Immature Granulocyte 0.01 x10 3/uL           Recent CBC   Recent Labs      09/21/15   0225   RBC  3.25*   HGB  9.9*   HEMATOCRIT  32.6*   MCV  100.3*   MCH  30.5   MCHC  30.4*   RDW  15   MPV  11.1     Recent BMP   Recent Labs      09/21/15   0225   GLUCOSE  104*   BUN  11.0   CREATININE  0.4*   CALCIUM  8.1   SODIUM  137   POTASSIUM  4.9   CHLORIDE  97*   CO2  34*       Rads:   Radiological Procedure reviewed.    Signed by: Sherrie Mustache, MD

## 2015-09-21 NOTE — Progress Notes (Signed)
ID PROGRESS NOTE    Date Time: 09/21/2015 10:12 AM  Patient Name: [name]        Subjective, ROS:    In [name]    Afebrile , hemodynamically stable   Stable labs   Off vent       Sleepy but communicative   Wife at bedside   Back pain +  Still with large amount of secretions , per nurse white   No vomiting , tolerating TF  Foley in place         Antibiotics and culture results:   Antibiotics: ceftazidime #8  Midodrine   S/P vanc , zosyn       Cultures:  4/3 legionella antigen negative   4/2 Ucx Ngtd  4/2 sputum cx mod mixed upper flora   4/2 Blood cx Ngtd  3/27 BAL cx upper flora   3/22 sputum cx Stenotrophomonas maltophilia , mixed upper flora    3/20 flu negative   3/18 cervical wound cx cut flora  3/18 cervical wound anaerobic cx ngtd  3/18 cervical wound AFB and fungal cx Ngtd  3/13 Ucx >100,000 E. Faecalis   3/13 Blood cx Ngtd  3/6 MRSA negative     3/9 path FIBROVASCULAR TISSUE, FEW VESSELS AND BONE CHIPS       Lines: PIV access    Physical Exam:     Filed Vitals:    09/21/15 1005   BP:    Pulse:    Temp:    Resp:    SpO2: 99%       General: in bed, in [name], sleepy but ommunicative. Off vent , no distress.   Wife at bedside   HEENT: moist mucosa ,  pale, not icteric , cervical collar in place , trach in place   Chest: S1S2 irregular , crackles bilaterally,  no wheeze , BS at bases decreased. Sounds better than yesterday   Abdomen: soft, not tender , BS +, PEG in place   Ext: + edema   Weakness  , no movement in legs   Foley in place       Labs:       Recent CBC WITH DIFF   Recent Labs      09/21/15   0225   WBC  5.38   RBC  3.25*   HGB  9.9*   HEMATOCRIT  32.6*   MCV  100.3*       Recent CMP   Recent Labs      09/21/15   0225   GLUCOSE  104*   BUN  11.0   CREATININE  0.4*   SODIUM  137   POTASSIUM  4.9   CHLORIDE  97*   CO2  34*           Rads:   4/7 xray Small bilateral pleural effusions, left side larger than right. Lower lung atelectasis            4/2 CT No CT evidence of  pulmonary embolism.Large bilateral pleural effusions with adjacent infiltrates worrisome for pneumonia    4/2 doppler Thrombosis right profunda femoral vein. Thrombosis left greater saphenous vein    3/23 sono Similar findings as seen on recent CT. Thick-walled bladder without significant distention with pericholecystic fluid. Lack of gallstones and clear focal tenderness makes cholecystitis less likely. The patient was uncooperative for this portable examination however. Differential considerations would include hypoalbuminemia, chronic cholecystitis or acute cholecystitis. If continued clinical concern can perform HIDA scan. Bilateral effusions.  Patient refused renal evaluation.    3/22 CT  Decompressed stomach located within the anterior abdomen extending  below the left hepatic lobe margin and above the transverse colon.. Nonspecific gallbladder wall thickening with pericholecystic fluid and stranding in the presence of small ascites. Recommend clinical correlation for acute cholecystitis.. Bilateral small effusions and basilar lower lobe consolidative atelectasis. Cardiac enlargement.    3/20 IRSuccessful deployment of a retrievable inferior vena cava filter    3/19 doppler Nonocclusive deep venous thrombosis involving a short segment of the proximal femoral vein.    3/17 CT  Markedly abnormal examination with anterolisthesis of C6 upon C7  measuring 16 mm with a jumped facet on the left and a comminuted displaced fracture involving the right C7 articular facet. Anterior cervical spinal fusion hardware is malpositioned with both plate and screws extending into the C6-C7 disc space.  The AP dimension of the bony spinal canal at the superior C5 level is reduced to 5 mm.       3/8 MRI Postsurgical change with prior laminectomy. 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 There findings suspicious for a disc ligamentous injury at this level. Correlation with CT is  advised    3/7 IR No angiographic evidence of intracranial aneurysm, arteriovenous malformation or dural fistula. Is no angiographic evidence of spinal arteriovenous malformation or dural fistula.  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   - worsening of weakness due to Fracture dislocation C6-C7 with malpositioned hardware  - S/P C5-T1 posterior fusion, C5-C7 anterior fusion with partial C7 corpectomy on 3/18. cx normal flora     Other issues during admission:  Fever/SIRS on 3/12   Symptoms of aspiration with increased cough and mild-mod oropharyngeal dysphagia   Urinary retention, required reinsertion of foley and E. Faecalis UTI  Leukocytosis  DVT (S/P IVC filter)    Hypoxic respiratory failure overnight 3/21-3/22   Now with Near complete opacification of the left lung   Gallbladder wall thickening per imaging   DVT       Plan:   1. SIRS.   Due to UTI and aspiration pneumonia.  Recent fever could be due to pneumonia/also DVT  Now afebrile and hemodynamically more stable.        2. Recurrent Hypoxic respiratory failure and aspiration pneumonia . Sputum cx: normal flora and stenotrophomonas   Episode on 3/21-22 due to aspiration/malositioned [place] .  Mucous plug after extubation 3/27 , S/P intubation and bronch   S/P trach 3/28     On ceftazidime.  Still with large amount of secretions but mostly white    Has finished the course, ok to stop   Monitor respiratory status closely     3. E. Faecalis UTI.   Cath related   Foley was  removed/then replaced x 2 given retention   Has finished the course. \  Repeat Ucx negative       4. 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   S/P C5-T1 posterior fusion, C5-C7 anterior fusion with partial C7 corpectomy on 3/18. cx normal flora     5. Leukocytosis.   Due to above.   Resolved.    6. DVT   Per primary     Discussed with the patient and his wife  Discussed with nursing   Discussed with Dr. Lemar Livings        Signed by: Ernest Salinas, Ernest Diaz. [id number]  Infectious Disease  Phone: [phone]  Fax: [phone]

## 2015-09-21 NOTE — Plan of Care (Signed)
Problem: Mechanical Ventilation  Goal: Tracheostomy will be maintained  Outcome: Progressing  Patient stable on trach mask 40-50 percent overnight. Occasional desaturations into the 80's requiring suctioning and repositioning. Recovers quickly. No vent needed overnight.

## 2015-09-21 NOTE — Progress Notes (Signed)
Case Management Note:        SW spoke with the patient's son, wife , and daughter in-law at the bedside. Patient's family would like to consider Acute Rehab facilities in [place]. CCM Lenis Dickinson found several facilities in the [place] and [place] area. Families first choice is Education officer, environmental and then [facility]. SW explained the need for private pay transportation, given that Medicare will only cover up to 50 miles of the transport.       SW will gather transportation quotes from several transport company's and provide the quotes to the family.       SW spoke with admissions at [facility], they are currently reviewing the clinicals and will call this writer back with a decision.           Everardo Pacific, MSW  Care Coordinator  [facility]  [phone]  [email or web address]

## 2015-09-21 NOTE — PT Progress Note (Signed)
[facility]   Physical Therapy Treatment  Patient:  Ernest Diaz MRN#:  [id number]  Unit: [facility] TOWER 4  Bed: V409/W119.14    Discharge Recommendations:   D/C Recommendations: Acute Rehab, Other (Comment) (if cont to progress/stays off vent)  DME Recommendations: tbd    If Acute Rehab, Other (Comment) (if cont to progress/stays off vent) recommended discharge disposition is not available, patient will need max assist for mobility, hospital bed, hoyer, w/c equipment, and home PT vs SNF.        Assessment:   Pt appears more lethargic and fatigued today with rx. Able to perform B UE AROM exercises and PROM of B LEs in bed. Transferred to EOB with max a but reports dizziness and nausea, limited tolerance for sitting today. Will need cont PT.     Treatment Activities: therapeutic exercise, neuro re-ed, tf and balance training    Educated the patient to role of physical therapy, plan of care, goals of therapy and safety with mobility and ADLs.    Plan:   PT Frequency: 3-4x/wk    Continue plan of care.       Precautions and Contraindications:   Falls, aspen    Updated Medical Status/Imaging/Labs:n/a    Subjective:    "I'm going to throw up"  Patient's medical condition is appropriate for Physical Therapy intervention at this time.  Patient is agreeable to participation in the therapy session.    Pain:   Scale: no c/o  Location: n/a  Intervention: n/a      Objective:   Patient is in bed with aspen, trach, trach collar, foley, PEG, IV, telemetry in place.    Cognition/neuro exam  Lethargic   Verbal using PMSV at start of rx but removed with dizziness, fatigue at EOB  No active LE movement noted    Functional Mobility  Rolling: max a  Supine to Sit: max a  Scooting: max a  Sit to Stand: nt  Stand to Sit: nt  Transfers: nt    Armed forces operational officer Sitting: fair-  Dynamic Sitting: fair-  Static Standing: nt  Dynamic Standing: nt    Therapeutic Exercises  Supine- AROM B UEs, PROM B  LEs    Patient Participation: fair  Patient Endurance: fair    Patient left with call bell within reach, all needs met, SCDs on, fall mat down and all questions answered. RN notified of session outcome and patient response. Prevalon boots placed.     Goals:  Goals  Goal Formulation: With patient/family  Time for Goal Acheivement: 7 visits  Goals: Select goal  Pt Will Go Supine To Sit: with minimal assist  Pt Will Perform Sit To Supine: with moderate assist  Pt Will Sit Edge of Bed: 11-15 min, with supervision  Pt Will Achieve Sitting Balance: 4/5 moves/returns trunkal midpoint 1-2 inches in multiple planes  Pt Will Perform Home Exer Program:  (family will be independent w/LE ROM)      Marylou Flesher, DPT    Time of Treatment:  PT Received On: 09/21/15  Start Time: 1140  Stop Time: 1210  Time Calculation (min): 30 min  Treatment # 3 out of 7 visits

## 2015-09-22 ENCOUNTER — Inpatient Hospital Stay: Payer: Medicare Other

## 2015-09-22 LAB — BASIC METABOLIC PANEL
BUN: 12 mg/dL (ref 9.0–28.0)
CO2: 37 mEq/L — ABNORMAL HIGH (ref 22–29)
Calcium: 8.5 mg/dL (ref 7.9–10.2)
Chloride: 94 mEq/L — ABNORMAL LOW (ref 100–111)
Creatinine: 0.5 mg/dL — ABNORMAL LOW (ref 0.7–1.3)
Glucose: 116 mg/dL — ABNORMAL HIGH (ref 70–100)
Potassium: 4.3 mEq/L (ref 3.5–5.1)
Sodium: 137 mEq/L (ref 136–145)

## 2015-09-22 LAB — CBC AND DIFFERENTIAL
Basophils Absolute Automated: 0.01 10*3/uL (ref 0.00–0.20)
Basophils Automated: 0 %
Eosinophils Absolute Automated: 0.05 10*3/uL (ref 0.00–0.70)
Eosinophils Automated: 1 %
Hematocrit: 33.4 % — ABNORMAL LOW (ref 42.0–52.0)
Hgb: 10.2 g/dL — ABNORMAL LOW (ref 13.0–17.0)
Immature Granulocytes Absolute: 0.01 10*3/uL
Immature Granulocytes: 0 %
Lymphocytes Absolute Automated: 1.53 10*3/uL (ref 0.50–4.40)
Lymphocytes Automated: 30 %
MCH: 30.7 pg (ref 28.0–32.0)
MCHC: 30.5 g/dL — ABNORMAL LOW (ref 32.0–36.0)
MCV: 100.6 fL — ABNORMAL HIGH (ref 80.0–100.0)
MPV: 10.8 fL (ref 9.4–12.3)
Monocytes Absolute Automated: 0.36 10*3/uL (ref 0.00–1.20)
Monocytes: 7 %
Neutrophils Absolute: 3.05 10*3/uL (ref 1.80–8.10)
Neutrophils: 61 %
Nucleated RBC: 0 /100 WBC (ref 0–1)
Platelets: 213 10*3/uL (ref 140–400)
RBC: 3.32 10*6/uL — ABNORMAL LOW (ref 4.70–6.00)
RDW: 15 % (ref 12–15)
WBC: 5.01 10*3/uL (ref 3.50–10.80)

## 2015-09-22 LAB — MAGNESIUM: Magnesium: 1.8 mg/dL (ref 1.6–2.6)

## 2015-09-22 LAB — GFR: EGFR: >60

## 2015-09-22 NOTE — Plan of Care (Signed)
Problem: Safety  Goal: Patient will be free from injury during hospitalization  Intervention: Hourly rounding.  Pt is restless, confused, mouths words, moves BUE, but no fine motor skills. On TM 35%, 8L, desated a few times, deep suction done by RT, good result. On Afib, rate controled, normotensive, 100's-150's, no edema, pulses palpable on all extremities, foley in place for retention, foley care done, AUOP, no BM. TF TwoCalHN running at 9ml/hr, with 200 ml WF q6h. Purposeful rounding completed, all meds given as ordered, will continue.

## 2015-09-22 NOTE — Progress Notes (Addendum)
Received patient awake, conscious, communicates thru mouth words and thru PSMV. Trached, on O2 mask at 35% FiO2. Encouraged deep breathing exercises. Can cough minimally. Suctioning Q3-4 with minimal secretions.  On PEG continuous feeding, tolerating well with no residuals noted. With [facility] in place for retention.   Observed to have movements on the upper extremities and very minimal to none on the lower extremities.  1000 Seen by surgery. Ordered for a follow up CT scan of the spine. PIV in place for procedure. Called CT scan and is scheduled at around 1300.  1100 Worked on by physical therapy, able to sit on the side of the bed for a couple of minutes, with episodes of desaturation, suctioning done accordingly. Also with episodes of afib in svr as low as 38 bpm, no complaints of shortness of breath. Patient encourage to do deep breathing exercises, not to strain. Placed back in bed in a comfortable position.  Seen by attending as well, orders made and carried out. Made aware of brady and desat episodes during PT session. Will continue to monitor patient.  1700 brought down to CT scan by trasnport RN, uneventful. Hooked back to [facility] monitors.   Kept safe and comfortable during shift. Bed on lowest with alarms on and fall mats in place.

## 2015-09-22 NOTE — Progress Notes (Signed)
ID PROGRESS NOTE    Date Time: 09/22/2015 1:00 PM  Patient Name: [facility]        Subjective, ROS:    In [facility]  Nurse at bedside      Low grade fever , hemodynamically stable   Stable labs   Off vent     Awake and  communicative   Denies SOB  Per nurse less secretions today   No vomiting   No diarrhea       Antibiotics and culture results:   Antibiotics: off antibiotics   Midodrine   S/P vanc , zosyn , ceftazidime       Cultures:  4/3 legionella antigen negative   4/2 Ucx Ngtd  4/2 sputum cx mod mixed upper flora   4/2 Blood cx Ngtd  3/27 BAL cx upper flora   3/22 sputum cx Stenotrophomonas maltophilia , mixed upper flora    3/20 flu negative   3/18 cervical wound cx cut flora  3/18 cervical wound anaerobic cx ngtd  3/18 cervical wound AFB and fungal cx Ngtd  3/13 Ucx >100,000 E. Faecalis   3/13 Blood cx Ngtd  3/6 MRSA negative     3/9 path FIBROVASCULAR TISSUE, FEW VESSELS AND BONE CHIPS       Lines: PIV access    Physical Exam:     Filed Vitals:    09/22/15 1200   BP: 114/58   Pulse: 62   Temp: 98.6 F (37 C)   Resp: 15   SpO2: 99%       General: in bed, in [facility], awake and ommunicative. Off vent , no distress.   Nurse  at bedside   HEENT: moist mucosa ,  pale, not icteric , cervical collar in place , trach in place   Chest: S1S2 irregular , coarse BS bilaterally,  no wheeze , BS at bases decreased.   Abdomen: soft, not tender , BS +, PEG in place   Ext: + edema   Weakness  , no movement in legs   Foley in place       Labs:       Recent CBC WITH DIFF   Recent Labs      09/22/15   0326   WBC  5.01   RBC  3.32*   HGB  10.2*   HEMATOCRIT  33.4*   MCV  100.6*       Recent CMP   Recent Labs      09/22/15   0326   GLUCOSE  116*   BUN  12.0   CREATININE  0.5*   SODIUM  137   POTASSIUM  4.3   CHLORIDE  94*   CO2  37*           Rads:   4/11 xray No significant changes. Moderate to large bilateral pleural effusions and associated atelectasis.            4/2 CT No CT evidence of pulmonary  embolism.Large bilateral pleural effusions with adjacent infiltrates worrisome for pneumonia    4/2 doppler Thrombosis right profunda femoral vein. Thrombosis left greater saphenous vein    3/23 sono Similar findings as seen on recent CT. Thick-walled bladder without significant distention with pericholecystic fluid. Lack of gallstones and clear focal tenderness makes cholecystitis less likely. The patient was uncooperative for this portable examination however. Differential considerations would include hypoalbuminemia, chronic cholecystitis or acute cholecystitis. If continued clinical concern can perform HIDA scan. Bilateral effusions. Patient refused renal evaluation.  3/22 CT  Decompressed stomach located within the anterior abdomen extending  below the left hepatic lobe margin and above the transverse colon.. Nonspecific gallbladder wall thickening with pericholecystic fluid and stranding in the presence of small ascites. Recommend clinical correlation for acute cholecystitis.. Bilateral small effusions and basilar lower lobe consolidative atelectasis. Cardiac enlargement.    3/20 IRSuccessful deployment of a retrievable inferior vena cava filter    3/19 doppler Nonocclusive deep venous thrombosis involving a short segment of the proximal femoral vein.    3/17 CT  Markedly abnormal examination with anterolisthesis of C6 upon C7  measuring 16 mm with a jumped facet on the left and a comminuted displaced fracture involving the right C7 articular facet. Anterior cervical spinal fusion hardware is malpositioned with both plate and screws extending into the C6-C7 disc space.  The AP dimension of the bony spinal canal at the superior C5 level is reduced to 5 mm.       3/8 MRI Postsurgical change with prior laminectomy. 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 There findings suspicious for a disc ligamentous injury at this level. Correlation with CT is advised    3/7 IR No  angiographic evidence of intracranial aneurysm, arteriovenous malformation or dural fistula. Is no angiographic evidence of spinal arteriovenous malformation or dural fistula.  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   - worsening of weakness due to Fracture dislocation C6-C7 with malpositioned hardware  - S/P C5-T1 posterior fusion, C5-C7 anterior fusion with partial C7 corpectomy on 3/18. cx normal flora     Other issues during admission:  SIRS    Recurrent aspiration pneumonia and hypoxic respiratory failure (cx stenotrophomonas , normal flora)  mild-mod oropharyngeal dysphagia   Urinary retention, required reinsertion of foley and E. Faecalis UTI  Leukocytosis  DVT (S/P IVC filter)  Gallbladder wall thickening per imaging         Plan:   1. SIRS.   Due to UTI and aspiration pneumonia.  Recent fever could be due to pneumonia/also DVT    Overnight low grade fever but overall hemodynamically stable      2. Recurrent Hypoxic respiratory failure and aspiration pneumonia . Sputum cx: normal flora and stenotrophomonas   Episode on 3/21-22 due to aspiration/malositioned [place] .  Mucous plug after extubation 3/27 , S/P intubation and bronch   S/P trach 3/28     Finished the last course of antibiotics 4/10.   Secretions better today   Last xray Moderate to large bilateral pleural effusions and associated atelectasis.   Monitor respiratory status closely       3. 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  S/P C5-T1 posterior fusion, C5-C7 anterior fusion with partial C7 corpectomy on 3/18. cx normal flora     4. Leukocytosis.   Due to above.   Resolved.    Discussed with the patient and nursing      Signed by: [place]. [id number]  Infectious Disease  Phone: [phone]  Fax: [phone]

## 2015-09-22 NOTE — Progress Notes (Signed)
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 Jaynie Collins at [facility]   - s/p C6-7 ACDF, C6-C7 posterior fusion with lateral mass scews on 08/20/15 by Dr. Jaynie Collins  - s/p  C5-T1 posterior fusion, C5-C7 anterior fusion with partial C7 corpectomy on 08/29/15 by Dr. Claudette Laws  - NSGY following, appreciate recommendations   - Hard collar at all times secondary to neck weakness  - Continue Fentanyl patch for pain control, Gabapentin, increase prn Morphine for pain control   - Palliative Care following for goals of care, code status is DNR/support ok  - Extensive discussion held with patient and family regarding prognosis, patient and family aware LE weakness may not significantly improve  - Neurosurgery, Dr. Jaynie Collins, recommending CT C spine to evaluate fusion construct.  Imaging pending at this time.     Hypoxic/hypercarbic respiratory failure s/p trach 3/28:   - Bronched 3/27 for left lung collapse from mucus plugging in the setting of stenotrophomonas growth in sputum culture   - On TC, has not required ventilation over past 24 hours, still has occasional desaturations to 80s requiring aggressive suctioning   - Pulmonology following, appreciate recommendations    - On bronchodilators and receiving aggressive suctioning  - Continue Scopolamine patch for secretions, po Robinul for secretions    - CTA negative for PE, shows b/l pleural effusions  - Per Pulmonology no indication for thoracentesis at this time, continue diuresis for pleural effusions    - Sputum culture shows many WBCs, GPC, continue Ceftazidime at this time, Vancomycin discontinued per ID recommendations, has finished Ceftazadime for 7 total days however per ID recommendations will continue given significant  secretions  - CXR shows b/l pleural effusions and atelectasis from 4/11  - Blood cultures negative, ID following, appreciate recommendations     - Continue diuresis with Lasix 20mg  PO, tolerating diuresis with Midodrine for BP support   - SLP consulted for PMSV, requires aggressive suctioning however able to communicate with PMSV    Hypotension - chronic, in the setting of dysautonomia  - Continue Midodrine  - Not requiring Florinef at this time   - Will attempt to wean off Midodrine when not requiring diuresis from respiratory standpoint     Atrial Fibrillation: Rate controlled without medications     - Discussed with Neurosurgery Dr. 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 Was previously off Eliquis due to recent spinal cord surgery  - Resumed Eliquis on 09/13/15, d/w family and Neurosurgery before starting Eliquis. Possible risks/complications given recent spinal surgery discussed with family who are in agreement with starting AC at this time      Insomnia  - Difficulty sleeping at night and some agitation- pulling at lines/tubes  - Continue Seroquel 25 mg q hs  - EKG this admission shows QTc in 420s    GI/DVT prophylaxis: on Pepcid; Eliquis    Dispo:  Pulm recommending LTAC at discharge. Given frequent desats.      Safety Checklist:     DVT prophylaxis:  CHEST guideline (See page [id number]) Eliquis    Foley:  [place] R[facility] Foley protocol Yes  IVs:  PIV   PT/OT: Yes   Daily CBC & or Chem ordered:  SHM/ABIM guidelines (see #5) Yes   Reference for approximate charges of common labs: CBC auto diff - $76  BMP - $99  Mg - $79    Lines:     Patient Lines/Drains/Airways Status    Active PICC Line / CVC Line / PIV Line / Drain / Airway / Intraosseous Line / Epidural Line / ART Line / Line / Wound / Pressure Ulcer / NG/OG Tube     Name:   Placement date:   Placement time:   Site:   Days:    Peripheral IV  08/29/15 Left Hand  08/29/15   1345   Hand   11    Peripheral IV 08/30/15 Right Antecubital  08/30/15   0030   Antecubital   10    Peripheral IV 09/08/15 Left Forearm  09/08/15   0400   Forearm   1    Peripheral IV 09/08/15 Right Hand  09/08/15   0400   Hand   1    Peripheral IV 09/08/15 Left Forearm  09/08/15   2200   Forearm   less than 1    Gastrostomy/Enterostomy Gastrostomy-jejunostomy  09/04/15         5    Urethral Catheter Temperature probe 16 Fr.  09/07/15   1300   Temperature probe   2    External Urinary Catheter  09/06/15   1230      3    Surgical Airway Shiley 6 mm  09/08/15   1110   6 mm   1    Wound 08/25/15 Pressure Injury Buttocks Left;Right Non-Blanchable Erythema  08/25/15   2258   Buttocks   14    Incision Site 08/20/15 Neck  08/20/15   1427     19    Incision Site 08/20/15 Neck  08/20/15   1932     19    Incision Site 08/28/15 Neck  08/28/15   2352     11    Incision Site 08/29/15 Neck  08/29/15   0515     11                 Disposition:     Today's date: 09/22/2015  Length of Stay: 36  Anticipated medical stability for discharge: 3- 4 days  Reason for ongoing hospitalization: quadriparesis  Anticipated discharge needs: TBD    Subjective     CC: Quadriplegia    Interval History/24 hour events: please see below    HPI/Subjective: [facility] is a 70 y.o. male hx afib on Eliquis + aspirin, HTN who presented to [facility] after a fall. CT head and spine were negative so he was discharged. 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 MRI cervical and thoracic spine showed spinal epidural hematoma extending from the foramen magnum to the upper thoracic spine. Neurosurgery (Dr. Jaynie Collins) was consulted. His Eliquis was reversed. He ultimately had C3-C5 and T3-T4 decompressive laminectomies 2/27 by Dr. Jaynie Collins. He noted extensive dorsal epidural venous plexus and when he dissected out these vessels they bled. Dr. Jaynie Collins requested he be transferred here for spinal angiogram.  These symptoms are sudden onset, severe intensity, without alleviating factors.     3/29. More stable at [facility] ICU neurologically, stable at trach collar, no fever/diarrhea. Tolerating tube feeding. Follows commands. Not in any distress.   3/30. More alert and awake, follows commands.  No any acute events noted. Family at bed side.  3/31. Had desaturation intermittently since last night, had significant secretion in his trach suctioning. No fever. More alert and awake. Not in distress now, more comfort with PS. Has mild discomfort at trach site. No discharge from the trach site.  4/1. More alert, no any acute events noted. On PS mode. No fever, diarrhea and respiratory distress.  4/2. Has intermittent chest pain and SOB. Had low grade fever today. No cough, diarrhea, abdominal pain and chills. Not in distress. Follows commands  4/3. no any acute events noted. Still has been on PS, no fever today. More stable.    4/4: No acute events overnight.  FiO2 increased slightly to 50%.  Requests a shave today and states pain at neck site is better today.  Denies other acute concerns/complaints at this time.    4/5: No acute events overnight.  Denies chest pain, shortness of breath, abdominal pain at this time.  Still with weak phonation.  Secretions more improved today.  Frustrated with weak phonation and inability to communicate.  Denies other acute concerns/complaints at this time.   4/6: Patient doing better today.  Denies chest pain, shortness of breath, abdominal pain at this time.  Able to communicate with PMSV today.  Secretions continue improving.  Plan of care discussed with family including POA son Mick at the bedside.  No acute events overnight.    4/7: This AM, had episode of becoming bradycardic and hypoxic when attempting trial with PMSV.  Required deep suctioning with lots of secretions present.  O2 saturation and HR improved afterwards.  Denies chest pain, shortness of breath, abdominal pain.  Has been able to  communicate with PMSV although phonation weak.  Denies other acute concerns/complaints at this time.         4/8: No acute events overnight.  Denies chest pain, shortness of breath, abdominal pain.  Able to communicate with PMSV.  Still having copious secretions.  No episodes of desats.  Denies other acute concerns/complaints at this time.    4/9: No acute events overnight.  Agitated and pulling at lines/tubes however.  Reports he did not sleep well.  Tearful regarding medical condition and weakness in lower extremities.  Denies chest pain, shortness of breath, abdominal pain.  Denies other acute concerns/complaints at this time.  Plan of care discussed with wife via phone at [phone].   4/10: Patient has been off vent > 24 hours.  Still with copious secretions and desat to 80s overnight requiring aggressive suctioning.  Denies chest pain, shortness of breath, abdominal pain, N/V, diarrhea, headache.  States he is otherwise doing better.  Phonation stronger this AM with PMSV.  Will work with PT today.  Has some upper neck pain around C-collar however better controlled with increased po Morphine.  Reports lightheadedness/dizziness from a few days ago has resolved without acute intervention and has not recurred.      4/11:  Off vent overnight, however noted to have bradycardic episode with desaturation while working with OT.  Vitals improved after suctioning.  Patient currently asymptomatic, family at bedside. No focal complaints.      Physical Exam:     VITAL SIGNS PHYSICAL EXAM   Temp:  [97.9 F (36.6 C)-100 F (37.8 C)] 98.8 F (37.1 C)  Heart Rate:  [57-84] 65  Resp Rate:  [12-27] 14  BP: (104-151)/(55-70) 112/56 mmHg  FiO2:  [35 %-40 %] 40 %    Intake/Output Summary (Last 24 hours) at  09/22/15 1531  Last data filed at 09/22/15 1400   Gross per 24 hour   Intake   1480 ml   Output   2350 ml   Net   -870 ml    Physical Exam  General: awake, alert X 3, not in distress  HEENT- NC/AT  Neck- wearing Aspen collar,  trach in place- site: dry and clean   Cardiovascular: irregularly irregular, no murmurs, rubs or gallops  Lungs: coarse breath sounds and dull bilaterally, without wheezing or rales  Abdomen: soft, non-tender, non-distended; no palpable masses,  normoactive bowel sounds. PEG tube site- clean/dry/no leakage  Extremities: Trace b/l LE edema   Neuro: Cranial nerves- grossly intact, 4/5 in both upper extremities, 0/5 in both lower extremities, follows commands, sensation intact in upper and lower extremities b/l       Meds:     Medications were reviewed:    Labs:     Labs (last 72 hours):      Recent Labs  Lab 09/22/15  0326 09/21/15  0225   WBC 5.01 5.38   HGB 10.2* 9.9*   HEMATOCRIT 33.4* 32.6*   PLATELETS 213 215            Recent Labs  Lab 09/22/15  0326 09/21/15  0225   SODIUM 137 137   POTASSIUM 4.3 4.9   CHLORIDE 94* 97*   CO2 37* 34*   BUN 12.0 11.0   CREATININE 0.5* 0.4*   CALCIUM 8.5 8.1   GLUCOSE 116* 104*                   Microbiology, reviewed  Microbiology Results     Procedure Component Value Units Date/Time    AFB culture and smear [[id number]] Collected:  08/29/15 0200    Specimen Information:  Sputum from Wound Updated:  09/14/15 2007    Narrative:      ORDER#: [id number]                                    ORDERED BY: Nicoletta Dress  SOURCE: Wound POSTERIOR CERVICAL WOUND               COLLECTED:  08/29/15 02:00  ANTIBIOTICS AT COLL.:                                RECEIVED :  08/29/15 05:54  Stain, Acid Fast                           FINAL       08/29/15 11:42  08/29/15   No Acid Fast Bacillus Seen  Culture Acid Fast Bacillus (AFB)           PRELIM      09/14/15 20:02  09/07/15   No growth after 1 week/s of incubation.  09/14/15   No growth after 2 week/s of incubation.      Anaerobic culture [[id number]] Collected:  08/29/15 0200    Specimen Information:  Other from Wound Updated:  09/02/15 0835    Narrative:      ORDER#: [id number]                                    ORDERED BY: Nicoletta Dress  SOURCE:  Wound POSTERIOR CERVICAL WOUND               COLLECTED:  08/29/15 02:00  ANTIBIOTICS AT COLL.:                                RECEIVED :  08/29/15 05:54  Culture, Anaerobic Bacteria                FINAL       09/02/15 08:35  09/02/15   No anaerobic growth      CULTURE + Dierdre Forth [[id number]] Collected:  09/13/15 1047    Specimen Information:  Sputum from Endotracheal Updated:  09/14/15 1658    Narrative:      ORDER#: [id number]                                    ORDERED BY: Lemar Livings, EYAD  SOURCE: Endotracheal Aspirate lungs                  COLLECTED:  09/13/15 10:47  ANTIBIOTICS AT COLL.:                                RECEIVED :  09/13/15 11:56  Stain, Gram (Respiratory)                  FINAL       09/13/15 14:36  09/13/15   Many WBC's             Rare Squamous epithelial cells             Few Gram positive cocci  Culture and Gram Stain, Aerobic, RespiratorPRELIM      09/14/15 16:58  09/14/15   Moderate growth of mixed upper respiratory flora      CULTURE + Dierdre Forth [[id number]] Collected:  09/07/15 1240    Specimen Information:  Sputum from Bronchial Lavage Updated:  09/09/15 1324    Narrative:      ORDER#: [id number]                                    ORDERED BY: Jearld Shines, DAN  SOURCE: Bronchial Lavage bronchoscopy                COLLECTED:  09/07/15 12:40  ANTIBIOTICS AT COLL.:                                RECEIVED :  09/07/15 15:15  Stain, Gram (Respiratory)                  FINAL       09/07/15 17:13  09/07/15   Moderate WBC's             No Squamous epithelial cells             No organisms seen  Culture and Gram Stain, Aerobic, RespiratorFINAL       09/09/15 13:24  09/08/15   Light growth of mixed upper respiratory flora      CULTURE + Dierdre Forth [[id number]] Collected:  09/02/15 1452    Specimen Information:  Sputum from Sputum, Suctioned Updated:  09/04/15 1426  Narrative:      ORDER#: [id number]                                    ORDERED BY:  Alysia Penna  SOURCE: Sputum, Suctioned sputum                     COLLECTED:  09/02/15 14:52  ANTIBIOTICS AT COLL.:                                RECEIVED :  09/02/15 18:47  Stain, Gram (Respiratory)                  FINAL       09/02/15 22:14  09/02/15   Few WBC's             No Epithelial cells             Few Mixed Respiratory Flora  Culture and Gram Stain, Aerobic, RespiratorFINAL       09/04/15 14:26   +  09/03/15   Moderate growth of mixed upper respiratory flora  09/03/15   Light growth of Stenotrophomonas maltophilia      _____________________________________________________________________________                                    S.malto       ANTIBIOTICS                     MIC  INTRP      _____________________________________________________________________________  Ceftazidime                      4     S        Levofloxacin                    <=1    S        Trimethoprim/Sulfamethoxazole <=0.5/9  S        _____________________________________________________________________________            S=SUSCEPTIBLE     I=INTERMEDIATE     R=RESISTANT                            N/S=NON-SUSCEPTIBLE  _____________________________________________________________________________      CULTURE BLOOD AEROBIC AND ANAEROBIC [[id number]] Collected:  09/13/15 0328    Specimen Information:  Blood, Venipuncture Updated:  09/15/15 0721    Narrative:      ORDER#: [id number]                                    ORDERED BY: NGUYEN, HUY  SOURCE: Blood, Venipuncture stick                    COLLECTED:  09/13/15 03:28  ANTIBIOTICS AT COLL.:                                RECEIVED :  09/13/15 06:22  Culture Blood Aerobic and Anaerobic        PRELIM      09/15/15 07:21  09/14/15  No Growth after 1 day/s of incubation.  09/15/15   No Growth after 2 day/s of incubation.      CULTURE BLOOD AEROBIC AND ANAEROBIC [[id number]] Collected:  08/24/15 0909    Specimen Information:  Blood, Venipuncture Updated:  08/29/15 1521    Narrative:       ORDER#: [id number]                                    ORDERED BY: Wynetta Fines, VIN  SOURCE: Blood, Venipuncture peripheral               COLLECTED:  08/24/15 09:09  ANTIBIOTICS AT COLL.:                                RECEIVED :  08/24/15 13:17  Culture Blood Aerobic and Anaerobic        FINAL       08/29/15 15:21  08/29/15   No growth after 5 days of incubation.      Fungus culture [[id number]] Collected:  08/29/15 0200    Specimen Information:  Other from Wound Updated:  09/09/15 1529    Narrative:      ORDER#: [id number]                                    ORDERED BY: Nicoletta Dress  SOURCE: Wound POSTERIOR CERVICAL WOUND               COLLECTED:  08/29/15 02:00  ANTIBIOTICS AT COLL.:                                RECEIVED :  08/29/15 05:54  Stain, Fungal                              FINAL       08/29/15 12:01  08/29/15   No Fungal or Yeast Elements Seen  Culture Fungus                             PRELIM      09/09/15 15:28  09/09/15   No growth after 1 week/s of incubation.      Legionella antigen, urine [[id number]] Collected:  09/14/15 0326    Specimen Information:  Urine from Urine, Catheterized, Foley Updated:  09/14/15 0559    Narrative:      ORDER#: [id number]                                    ORDERED BY: NGUYEN, HUY  SOURCE: Urine, Catheterized, Foley                   COLLECTED:  09/14/15 03:26  ANTIBIOTICS AT COLL.:                                RECEIVED :  09/14/15 04:23  Legionella, Rapid Urinary Antigen          FINAL       09/14/15 05:58  09/14/15   Negative for Legionella pneumophila Serogroup 1 Antigen             Limitations of Test:             1. Negative results do not exclude infection with Legionella                pneumophila Serogroup 1.             2. Does not detect other serogroups of L. pneumophila                or other Legionella species.             Test Reference Range: Negative      MRSA culture [[id number]] Collected:  08/17/15 2043    Specimen Information:  Body Fluid from  Nasal/Throat ASC Admission Updated:  08/19/15 0328    Narrative:      ORDER#: [id number]                                    ORDERED BY: Paulita Fujita  SOURCE: Nares and Throat                             COLLECTED:  08/17/15 20:43  ANTIBIOTICS AT COLL.:                                RECEIVED :  08/18/15 03:03  Culture MRSA Surveillance                  FINAL       08/19/15 03:28  08/19/15   Negative for Methicillin Resistant Staph aureus from Nares and             Negative for Methicillin Resistant Staph aureus from Throat      Rapid influenza A/B antigens [[id number]] Collected:  08/31/15 1649    Specimen Information:  Nasopharyngeal from Nasal Aspirate Updated:  08/31/15 1727    Narrative:      ORDER#: [id number]                                    ORDERED BY: Sterling Big  SOURCE: Nasal Aspirate                               COLLECTED:  08/31/15 16:49  ANTIBIOTICS AT COLL.:                                RECEIVED :  08/31/15 17:04  Influenza Rapid Antigen A&B                FINAL       08/31/15 17:27  08/31/15   Negative for Influenza A and B             Reference Range: Negative      Urine culture [[id number]] Collected:  09/13/15 1821    Specimen Information:  Urine from Urine, Catheterized, In & Out Updated:  09/14/15 1851    Narrative:      ORDER#: [id number]  ORDERED BY: NGUYEN, HUY  SOURCE: Urine, Catheterized, In & Out                COLLECTED:  09/13/15 18:21  ANTIBIOTICS AT COLL.:                                RECEIVED :  09/13/15 21:14  Culture Urine                              FINAL       09/14/15 18:51  09/14/15   No growth of >1,000 CFU/ML, No further work      Urine culture [[id number]] Collected:  08/24/15 1718    Specimen Information:  Urine from Urine, Catheterized, Foley Updated:  08/26/15 1544    Narrative:      ORDER#: [id number]                                    ORDERED BY: Alysia Penna  SOURCE: Urine, Catheterized, Foley                   COLLECTED:  08/24/15  17:18  ANTIBIOTICS AT COLL.:                                RECEIVED :  08/24/15 23:42  Culture Urine                              FINAL       08/26/15 15:43   +  08/26/15   >100,000 CFU/ML Enterococcus faecalis      _____________________________________________________________________________                                   E.faecalis     ANTIBIOTICS                     MIC  INTRP      _____________________________________________________________________________  Ampicillin                       1     S        Gentamicin High Level Resistan <=500   S        Levofloxacin                    <=1    S        Nitrofurantoin                 <=16    S  D1    Penicillin                       4     S        Streptomycin High Level Resist<=1000   S        Tetracycline                    >8     R        Vancomycin  2     S          -----DRUG COMMENTS----------    D1:  Nitrofurantoin should only be used for the treatment of         uncomplicated cystitis.         [facility] Ant[employer]icrobial Subcommittee June 2015  _____________________________________________________________________________            S=SUSCEPTIBLE     I=INTERMEDIATE     R=RESISTANT                            N/S=NON-SUSCEPTIBLE  _____________________________________________________________________________      Wound culture & gram stain [[id number]] Collected:  08/29/15 0200    Specimen Information:  Wound from Wound Updated:  08/31/15 1209    Narrative:      ORDER#: [id number]                                    ORDERED BY: Nicoletta Dress  SOURCE: Wound POSTERIOR CERVICAL WOUND               COLLECTED:  08/29/15 02:00  ANTIBIOTICS AT COLL.:                                RECEIVED :  08/29/15 05:54  Stain, Gram                                FINAL       08/29/15 08:45  08/29/15   No Squamous epithelial cells seen             Rare WBCs             No organisms seen  Culture and Gram Stain, Aerobic, Wound     FINAL       08/31/15 12:09    +  08/31/15   Very light growth of mixed cutaneous flora            Imaging, reviewed  Xr Chest Ap Portable    09/22/2015   No significant changes. Moderate to large bilateral pleural effusions and associated atelectasis. Wynema Birch, MD 09/22/2015 7:32 AM         Signed by: Les Pou, MD

## 2015-09-22 NOTE — Progress Notes (Addendum)
CCM  spoke to the patient's son per his request to speak with SW Vikki Ports who was not available    CCM introduced self and was here to answer any questions    Son expressed that this am his father was going to be placed in an acute rehab and now he is supposed to shift to going to an [facility] and he wanted to be sure that his father would be placed in the correct facility.    CCM attempted to explain that yesterday he was well enough to go to an acute rehab and today after some changes it is considered more appropriate to send him to an LTACH. Significant other (SO) of patient's son spoke up stating that he was being referred to the wrong type of a facility. CCM attempted to explain that they were 2 different types of facilities and because medical conditions do not follow a straight line plans have to change sometimes    CCM attempted to focus back on the patient's son and the SO voiced her displeasure and frustration with the process and the changes. After she expressed her thoughts CCM replied, "What would you like me to say that would make you happy? The last two things I've said have made you unhappy what would you like to hear?"      Patient's son indicated that he would like to know when he could expect a referral list for LTACHs specifically for [facility] and CCM indicated that Vikki Ports could have this available to him on 4/12 AM and son indicated that this is fine and he would be in later in the am to speak with Curt Bears SW was updated after her meeting     Lenis Dickinson RN, MSN  Clinical Case Manager   [facility]   Email: [email or web address]  Spectralink: Monday-Friday [phone] on Weekends call x [phone] Thank You

## 2015-09-22 NOTE — Progress Notes (Signed)
Neurosurgery Progress Note  HD  S:   No neck pain    O:   Filed Vitals:    09/22/15 0943   BP:    Pulse: 63   Temp:    Resp: 18   SpO2:          PHYSICAL EXAM:  Awake, alert  Oriented  Speech is fluent  Follows commands in arms briskly  No movement in legs  Anterior and posterior incisions are healing well        A/P:   Neuro without significant change  Agree with disposition planning to rehab  Recommend cervical CT without contrast to assess fusion construct        Rella Larve, MD

## 2015-09-22 NOTE — Progress Notes (Signed)
Nutrition Follow-Up:    A/D/I:    Recommend:   Continue TF - 2 Cal HN @ 45 ml/hr, Prosource 2 packets BID (2320 kcals, 134 gm protein)     Clinical Update:  Quad, resp failure s/p trach, hypotension, afib, DVT, insomnia  Labs: reviewed  Meds: pepcid, lasix, risaquad, pericolace     Diet / Nutrition Support Order: 2 Cal HN @ 45 ml/hr, Prosource 1 packet BID    M/E:  Monitor nutr support goals, GI, med tx plan     Georgina Pillion, RD; Philis Kendall [phone]

## 2015-09-22 NOTE — OT Progress Note (Signed)
[facility]   Occupational Therapy Treatment     Patient: Ernest Diaz    MRN#: [id number]   Unit: [facility] TOWER 4  Bed: V409/W119.14      Discharge Recommendations:   Discharge Recommendation: SNF   DME Recommended for Discharge:  (TBD at SNF)    If SNF recommended discharge disposition is not available, patient will need 2-person total assist for mobility and max-total assist for ADLs/IADLs, hospital bed, hoyer lift, w/c, bsc, stretcher transport equipment, and HHOT.     Discharge recommendations are subject to change based on patient's progress. Please refer to the most recent treatment note for the most up to date recommendation.  Assessment:   Pt received with BP 127/59, HR in 70s bpm, and O2 sats mid-upper 90s on trach mask and remained stable with rolling in bed. Max assist x2-dependent x2 to roll and to transfer supine to sit at EOB. Once at EOB, pt c/o dizziness. BP taken and noted to be 91/54. HR decreased to 38 bpm at sustained in 40s-50s. O2 sats began to decrease and dropped as low as 73% on trach mask. RN present while pt EOB and completed suctioning. After suctioning, O2 sats increased to 88% and HR increased to 70s bpm. Pt was returned to supine and BP increased to 133/60 with O2 sats increasing to 98%. Reviewed HEP with pt and family. Addressed all of family's questions/concerns. Pt will continue to benefit from skilled OT to maximize independence.  Patient left without needs and call bell within reach. RN notified of session outcome.    Treatment Activities: Modified ADL retraining, functional transfer training to sit EOB, modified bed mobility training, education on energy conservation/fall prevention techniques, compensatory technique education, UE/fine motor coordination/strengthening, endurance training.      Educated the patient to role of occupational therapy, plan of care, goals of therapy and HEP, safety with mobility and ADLs, energy  conservation techniques.    Plan:    OT Frequency Recommended: 2-3x/wk     Continue plan of care.       Precautions and Contraindications:   Falls  C-spine precautions  Aspen collar    Updated Medical Status/Imaging/Labs:  reviewed    Subjective: pt non-verbal today   Patient's medical condition is appropriate for Occupational Therapy intervention at this time.  Patient is agreeable to participation in the therapy session. Family and/or guardian are agreeable to patient's participation in the therapy session. Nursing clears patient for therapy.    Pain:   Scale: denied pain  Location:   Intervention:     Objective:   Patient received in bed with [facility] monitors, trach, trach mask, PEG, PIV access, Aspen c-collar donned, bed alarm, fall mat in place    Cognition   Pt lethargic and closing eyes; however able to follow simple one step commands with repetition and t/v cueing  Pleasant and cooperative  Able to respond to yes/no questions appropriately    Functional Mobility  Rolling:  Max assist-dependent  Supine to Sit: max assist x2-dependent x2  Scooting: dependent x2  Sit to Supine: dependent x2      Balance  Static Sitting: max assist    Self Care and Home Management  LE Dressing: dependent  UE Dressing: max assist-dependent    Therapeutic Exercises  HEP reviewed with pt and family  Neuro re-ed tasks completed to facilitate trunk activation  Exercise incorporated into tx.    Participation: fair  Endurance: poor    Patient  left with call bell within reach, all needs met, SCDs in place, fall mat in place, bed alarm engaged, chair alarm n/a and all questions answered. RN notified of session outcome and patient response.     Goals:  Time For Goal Achievement: 5 visits  ADL Goals  Patient will groom self: Moderate Assist  Patient will dress upper body: Moderate Assist (with modified techniques)  Patient will dress lower body: Moderate Assist, with AE  Pt will complete bathing: Moderate Assist  Patient will toilet: Moderate  Assist  Mobility and Transfer Goals  Pt will perform functional transfers: Moderate Assist  Neuro Re-Ed Goals  Pt will perform dynamic sitting balance: Minimal Assist, to increase ability to complete ADLs  Pt will sit at edge of bed: Stand by Assist, to prepare for OOB tasks  Musculoskeletal Goals  Pt will perform Home Exercise Program: stand by assist, with caregiver/family assist, to increase engagement in ADLs                   Caleen Jobs. Cedar Roseman, OTR/L, pager# [phone]    Time of Treatment  OT Received On: 09/22/15  Start Time: 1020  Stop Time: 1120  Time Calculation (min): 60 min    Treatment # 2 of 5 visits

## 2015-09-22 NOTE — Progress Notes (Signed)
RN Transport note  I took MR Conservation officer, historic buildings with ANA RN and Thayer Ohm RT to CT for a c-spine study. Pt was sleepy but was able to open eyes he was a fib 50's on monitor RR 16 SPO2 97% 8L 40% trach collar, we completed transport I gave bedside handoff to [employer]. C spine precautions were maintained by me throughout transport. Pt was back on monitor as Im left [facility]

## 2015-09-22 NOTE — Progress Notes (Signed)
PROGRESS NOTE    Date Time: 09/22/2015 3:21 PM  Patient Name: Ernest Diaz      Assessment:   1. Respiratory failure status post tracheostomy.  2. Recurrent mucus plugging with recent pneumonia.  3. 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  4. Pleural effusion.  5.  DVT or right LE.    Plan:   Off vent last night. Now on Trach collar, keep as tolerated.   Keep off vent as tolerated.  Less secretions noted overnight and today.  Cont robinul and scopolamine. May need an extra dose of IV Robinul.  Completed ceftaz course.  Afebrile.  Reculture sputum negative.  Able to tolerate PMSV with good phonation.  Cont Duonebs.  CTA neg for PE. Large bilateral effusions. Would hold off on tapping unless fever is persistent. F/U CXR shows improved pleural effusions.  Cont to diurese with lasix.  Repeat dopplers show thrombus in profunda femoral vein on right and superficial femoral vein on left. Eliquis started for cardiac reasons as well as DVT at 5mg  bid. Would continue. OK by Neurosurgery.  Episode of severe bradycardia with desaturation, occurred while sitting pt up, likely due to mucus plugging. Resolved with suctioning.  I feel pt would need to go to [facility] on discharge given his frequent desats. Stable for discharge to LTAC from [facility] perspective.    Subjective:   Episode of desaturation and bradycardia.      Medications:     Current Facility-Administered Medications   Medication Dose Route Frequency   . albuterol-ipratropium  3 mL Nebulization Q6H [facility]   . apixaban  5 mg per NG tube Q12H [facility]    And   . dextrose  60 mL Nasogastric Q12H SCH   . balsam peru-castor oil (VENELEX)   Topical Q12H SCH   . famotidine  20 mg Oral Q12H SCH    Or   . famotidine  20 mg Intravenous Q12H SCH   . fentaNYL  1 patch Transdermal Q72H   . furosemide  20 mg Oral Daily   . gabapentin  200 mg per G tube QHS   . glycopyrrolate  1 mg Oral Q8H SCH   . lactobacillus/streptococcus  2 capsule Oral Daily   . methocarbamol   750 mg Oral Q8H SCH   . midodrine  10 mg Oral TID MEALS   . QUEtiapine  25 mg Oral QHS   . scopolamine  1 patch Transdermal Q72H   . senna-docusate  1 tablet Oral QHS   . terazosin  1 mg Oral QHS       Review of Systems:   A comprehensive review of systems was: Negative    Physical Exam:     Filed Vitals:    09/22/15 1400   BP: 112/56   Pulse: 65   Temp: 98.8 F (37.1 C)   Resp: 14   SpO2: 100%       Intake and Output Summary (Last 24 hours) at Date Time    Intake/Output Summary (Last 24 hours) at 09/22/15 1521  Last data filed at 09/22/15 1400   Gross per 24 hour   Intake   1480 ml   Output   2350 ml   Net   -870 ml       General appearance - acyanotic, in no respiratory distress  Mental status - drowsy  Eyes - pupils equal and reactive, extraocular eye movements intact  Ears - not examined  Nose - normal and patent, no erythema, discharge  or polyps  Mouth - mucous membranes moist, pharynx normal without lesions  Neck - supple, no significant adenopathy and neck collar in place  Lymphatics - no palpable lymphadenopathy, no hepatosplenomegaly  Chest - rhonchi noted bilaterally  Heart - normal rate, regular rhythm, normal S1, S2, no murmurs, rubs, clicks or gallops  Abdomen - soft, nontender, nondistended, no masses or organomegaly  Extremities - peripheral pulses normal, no pedal edema, no clubbing or cyanosis    Labs:     Results     Procedure Component Value Units Date/Time    GFR [[id number]] Collected:  09/22/15 0326     EGFR >60.0 Updated:  09/22/15 0407    Basic Metabolic Panel [[id number]]  (Abnormal) Collected:  09/22/15 0326    Specimen Information:  Blood Updated:  09/22/15 0406     Glucose 116 (H) mg/dL      BUN 86.5 mg/dL      Creatinine 0.5 (L) mg/dL      Calcium 8.5 mg/dL      Sodium 784 mEq/L      Potassium 4.3 mEq/L      Chloride 94 (L) mEq/L      CO2 37 (H) mEq/L     Magnesium [[id number]] Collected:  09/22/15 0326    Specimen Information:  Blood Updated:  09/22/15 0406     Magnesium 1.8 mg/dL     CBC  and differential [[id number]]  (Abnormal) Collected:  09/22/15 0326    Specimen Information:  Blood from Blood Updated:  09/22/15 0347     WBC 5.01 x10 3/uL      Hgb 10.2 (L) g/dL      Hematocrit 72.5 (L) %      Platelets 213 x10 3/uL      RBC 3.32 (L) x10 6/uL      MCV 100.6 (H) fL      MCH 30.7 pg      MCHC 30.5 (L) g/dL      RDW 15 %      MPV 10.8 fL      Neutrophils 61 %      Lymphocytes Automated 30 %      Monocytes 7 %      Eosinophils Automated 1 %      Basophils Automated 0 %      Immature Granulocyte 0 %      Nucleated RBC 0 /100 WBC      Neutrophils Absolute 3.05 x10 3/uL      Abs Lymph Automated 1.53 x10 3/uL      Abs Mono Automated 0.36 x10 3/uL      Abs Eos Automated 0.05 x10 3/uL      Absolute Baso Automated 0.01 x10 3/uL      Absolute Immature Granulocyte 0.01 x10 3/uL     AFB culture and smear [[id number]] Collected:  08/29/15 0200    Specimen Information:  Sputum from Wound Updated:  09/21/15 2006    Narrative:      ORDER#: [id number]                                    ORDERED BY: Nicoletta Dress  SOURCE: Wound POSTERIOR CERVICAL WOUND               COLLECTED:  08/29/15 02:00  ANTIBIOTICS AT COLL.:  RECEIVED :  08/29/15 05:54  Stain, Acid Fast                           FINAL       08/29/15 11:42  08/29/15   No Acid Fast Bacillus Seen  Culture Acid Fast Bacillus (AFB)           PRELIM      09/21/15 20:02  09/07/15   No growth after 1 week/s of incubation.  09/14/15   No growth after 2 week/s of incubation.  09/21/15   No growth after 3 week/s of incubation.            Recent CBC   Recent Labs      09/22/15   0326   RBC  3.32*   HGB  10.2*   HEMATOCRIT  33.4*   MCV  100.6*   MCH  30.7   MCHC  30.5*   RDW  15   MPV  10.8     Recent BMP   Recent Labs      09/22/15   0326   GLUCOSE  116*   BUN  12.0   CREATININE  0.5*   CALCIUM  8.5   SODIUM  137   POTASSIUM  4.3   CHLORIDE  94*   CO2  37*       Rads:   Radiological Procedure reviewed.    Signed by: Sherrie Mustache, MD

## 2015-09-23 ENCOUNTER — Inpatient Hospital Stay: Payer: Medicare Other

## 2015-09-23 LAB — CBC AND DIFFERENTIAL
Basophils Absolute Automated: 0 10*3/uL (ref 0.00–0.20)
Basophils Automated: 0 %
Eosinophils Absolute Automated: 0.06 10*3/uL (ref 0.00–0.70)
Eosinophils Automated: 1 %
Hematocrit: 35.4 % — ABNORMAL LOW (ref 42.0–52.0)
Hgb: 10.8 g/dL — ABNORMAL LOW (ref 13.0–17.0)
Immature Granulocytes Absolute: 0.01 10*3/uL
Immature Granulocytes: 0 %
Lymphocytes Absolute Automated: 1.59 10*3/uL (ref 0.50–4.40)
Lymphocytes Automated: 34 %
MCH: 30.6 pg (ref 28.0–32.0)
MCHC: 30.5 g/dL — ABNORMAL LOW (ref 32.0–36.0)
MCV: 100.3 fL — ABNORMAL HIGH (ref 80.0–100.0)
MPV: 11.2 fL (ref 9.4–12.3)
Monocytes Absolute Automated: 0.42 10*3/uL (ref 0.00–1.20)
Monocytes: 9 %
Neutrophils Absolute: 2.54 10*3/uL (ref 1.80–8.10)
Neutrophils: 55 %
Nucleated RBC: 0 /100 WBC (ref 0–1)
Platelets: 221 10*3/uL (ref 140–400)
RBC: 3.53 10*6/uL — ABNORMAL LOW (ref 4.70–6.00)
RDW: 15 % (ref 12–15)
WBC: 4.62 10*3/uL (ref 3.50–10.80)

## 2015-09-23 LAB — BASIC METABOLIC PANEL
BUN: 12 mg/dL (ref 9.0–28.0)
CO2: 36 mEq/L — ABNORMAL HIGH (ref 22–29)
Calcium: 8.5 mg/dL (ref 7.9–10.2)
Chloride: 96 mEq/L — ABNORMAL LOW (ref 100–111)
Creatinine: 0.5 mg/dL — ABNORMAL LOW (ref 0.7–1.3)
Glucose: 107 mg/dL — ABNORMAL HIGH (ref 70–100)
Potassium: 4.4 mEq/L (ref 3.5–5.1)
Sodium: 140 mEq/L (ref 136–145)

## 2015-09-23 LAB — GFR: EGFR: >60

## 2015-09-23 LAB — MAGNESIUM: Magnesium: 1.9 mg/dL (ref 1.6–2.6)

## 2015-09-23 MED ORDER — FUROSEMIDE 20 MG PO TABS
20.0000 mg | ORAL_TABLET | Freq: Two times a day (BID) | ORAL | Status: DC
Start: 2015-09-23 — End: 2015-09-24
  Administered 2015-09-23 – 2015-09-24 (×3): 20 mg via ORAL
  Filled 2015-09-23 (×3): qty 1

## 2015-09-23 NOTE — Plan of Care (Signed)
Problem: Safety  Goal: Patient will be free from injury during hospitalization  Outcome: Progressing  Pt having issues with Desatting this am. Suctioned and flow of oxygen adjusted until pt stabilized.  Worked with pt and had HR drop to 27, sbps drop from 150 to 98, desatted to 85, and complained of lightheadedness. Pt returned to lying position. Pt recovered quickly.  Turns and oral care done per protocol. AUOP via foley cath, no bm this shift. Will continue to monitor.    Per MD Lim note, aspen collar has been removed while pt in bed, replaced when moving pt around.

## 2015-09-23 NOTE — PT Progress Note (Signed)
[facility]   Physical Therapy Treatment  Patient: Ernest Diaz     MRN#: [id number]  Unit: [facility] TOWER 4 Bed: F448/F448.01    Discharge Recommendations:   Discharge Recommendation: SNF     DME Recommendation: TBD at rehab      If SNF recommended discharge disposition is not available, patient will need SNF, if SNF not available, pt will need max A x 2 for functional mobility, positioning, and pressure relief, hospital bed, hoyer lift, WC, BSC, tub bench, stretcher transport, and HHPT.        Recommendations can change; please see most recent physical therapy treatment note for updates.      Assessment:   Attempted transfer to EOB and activity today, but pt still unable to tolerate. Pt with bradycardia upon sitting up (60-70s at rest), decreased to 30s, lowest 27bpm when pt returned supine, as pt c/o dizziness/room spinning (pt responsive throughout session); SpO2 slowly decreased to 85% while on trach collar; removed PMSV once transferred to EOB before desaturation. Attempted to take BP sitting EOB, but first time cuff was unable to obtain reading, then 2nd attempt was 98/54. Pt with significant orthostatic hypotension upon transition from supine>sit EOB (see values below: initial supine value 151/63). Pt was transferred back to supine, and almost immediately, pt's HR rebounded back to 70s, BP up to 129/63 and SpO2 >91%. Discussed with RN and MD after session.    Treatment Activities: therapeutic exercise, neuromuscular re-education     Educated the patient to role of physical therapy, plan of care, goals of therapy and HEP, safety with mobility and ADLs, energy conservation techniques.    Plan:   PT Frequency: 3-4x/wk    Treatment/Interventions: transfers, bed mobility, therapeutic activities, therapeutic exercises, neuromuscular re-education, functional mobility, endurance/activity tolerance, patient education and safety     Continue plan of care.       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 There has been posterior  decompression from C4-C7. There are bilateral lateral mass screws at C5,  C6 and T1 and on the right at C7. There are bilateral vertical rods.  There has been an anterior cervical discectomy and fusion at C5-C6 and  C7-T1. The surgical hardware appears intact. The previously seen air at  the posterior surgical sites has resolved. There is now anatomic  alignment between C6 and C7/corpectomy graft. There has been reduction  in previously seen bilateral jumped facets at C6-C7.      Vertebral body heights are maintained. Again seen are degenerative  changes. There is no prevertebral soft tissue thickening. There is  partial visualization of a tracheostomy tube. There is opacification of  the sphenoid sinus.   IMPRESSION:     1. Extensive anterior and posterior cervical spine surgery. Please see  the above discussion.   Otho Ket, MD  09/22/2015 6:08 PM    XR Chest   IMPRESSION:     No significant changes. Moderate to large bilateral pleural effusions and associated atelectasis.   Wynema Birch, MD  09/22/2015 7:32 AM  Lab Results   Component Value Date/Time    HGB 10.8* 09/23/2015 04:13 AM    HEMATOCRIT 35.4* 09/23/2015 04:13 AM    POTASSIUM 4.4 09/23/2015 04:13 AM    SODIUM 140 09/23/2015 04:13 AM    PT INR 1.2* 09/04/2015 06:05 AM    TROPONIN I 0.01 09/13/2015 02:42 PM        Orthostatic Vitals BP HR Circumumstance BP Location      Supine  151/63  67  prior to mobility  LUE         Sitting  98/54  51  unable to obtain in true sitting- brady down to 27bpm so  returned supine, then finished reading BP at this value          Supine   129/63  83                               Subjective:    Patient is agreeable to participation in the therapy session. Nursing clears patient for therapy.     Pt reports dizziness at EOB, and that he doesn't feel well.    Pain:   Scale: 0/10  Location: denies when asked  Intervention: meds per nursing    Objective:   Patient presents in bed with telemetry, pressure relief boots, peripheral IV, trach collar and PEG tube, indwelling urinary catheter, floor mats, bed alarm in place, and family present at this time.    Cognitive  Patient is alert and oriented x 4; cooperative and participatory    Functional Mobility  Rolling: max A  Supine to Sit: max A x 2  Sit to Supine: dep x 2  Scooting: dep to [facility]  Transfers: supine/sit only     Balance  Static Sitting: poor(+) to fair(-), c/o dizziness and room spinning  Dynamic Sitting: poor(+) to fair(-)    Therapeutic Exercises  BUE  horiz reaching, gripping incorporated into session    Participation Effort  good    Endurance  Poor(+); unable to tolerate sitting up EOB; vitals poor; bradycardia into 30s, orthostatic hypotension, and desaturation into 80s; all vitals stabilize once pt returns supine    Pt left in bed with needs met with RN informed of status as well as outcome of PT session.    Patient left with call bell and phone within reach, SCDs off with nursing, fall mat, bed alarm in place, all needs met and all questions answered.      Goals:    Goals  Goal Formulation: With patient/family  Time for Goal Acheivement: 7 visits  Goals: Select goal  Pt Will Go Supine To Sit: with minimal assist  Pt Will Perform Sit To Supine: with moderate assist  Pt Will Sit Edge of Bed: 11-15 min, with supervision  Pt Will Achieve Sitting Balance: 4/5 moves/returns trunkal midpoint 1-2 inches in multiple planes  Pt Will Perform Home Exer Program:  (family will be independent w/LE ROM)       Elliot Dally, DPT 5:46 PM  09/23/2015  Pager [phone]       Time of treatment:   PT Received On: 09/23/15  Start Time: 1120  Stop Time: 1200  Time Calculation (min): 40 min

## 2015-09-23 NOTE — Progress Notes (Signed)
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 Jaynie Collins at [facility]   - s/p C6-7 ACDF, C6-C7 posterior fusion with lateral mass scews on 08/20/15 by Dr. Jaynie Collins  - s/p  C5-T1 posterior fusion, C5-C7 anterior fusion with partial C7 corpectomy on 08/29/15 by Dr. Claudette Laws  - NSGY following, appreciate recommendations   - Hard collar at all times secondary to neck weakness  - Continue Fentanyl patch for pain control, Gabapentin, increase prn Morphine for pain control   - Palliative Care following for goals of care, code status is DNR/support ok  - Extensive discussion held with patient and family regarding prognosis, patient and family aware LE weakness may not significantly improve  - Neurosurgery, Dr. Jaynie Collins, aspen collor okay to be off in bed at rest.      Hypoxic/hypercarbic respiratory failure s/p trach 3/28:   - Bronched 3/27 for left lung collapse from mucus plugging in the setting of stenotrophomonas growth in sputum culture   - On TC, has not required ventilation over past 24 hours, still has occasional desaturations to 80s requiring aggressive suctioning   - Pulmonology following, appreciate recommendations    - On bronchodilators and receiving aggressive suctioning  - Continue Scopolamine patch for secretions, po Robinul for secretions    - CTA negative for PE, shows b/l pleural effusions  - Per Pulmonology no indication for thoracentesis at this time, continue diuresis for pleural effusions    - CXR shows b/l pleural effusions and atelectasis from 4/11  - Continue diuresis with Lasix 20mg  PO, tolerating diuresis with Midodrine for BP support   - SLP consulted for PMSV, requires aggressive suctioning however able to communicate with PMSV    ID:  Patient now  afebrile with resolved Leukocytosis.  -ID following; currently off antibiotics.      Hypotension - chronic, in the setting of dysautonomia  - Patient with episode of bradycardia, desaturation, and hypotension when sitting up with physical therapy. Per PT occurs with minimal exertion and no evidence of vagal.  Vitals return to normal after suctioning and when patient returned to bed.    - TTE pending  - Continue Midodrine  - Not requiring Florinef at this time   - Will attempt to wean off Midodrine when not requiring diuresis from respiratory standpoint      Atrial Fibrillation: Rate controlled without medications     - Discussed with Neurosurgery Dr. 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 Was previously off Eliquis due to recent spinal cord surgery  - Resumed Eliquis on 09/13/15, d/w family and Neurosurgery before starting Eliquis. Possible risks/complications given recent spinal surgery discussed with family who are in agreement with starting AC at this time      Insomnia  - Difficulty sleeping at night and some agitation- pulling at lines/tubes  - Continue Seroquel 25 mg q hs  - EKG this admission shows QTc in 420s    GI/DVT prophylaxis: on Pepcid; Eliquis    Dispo:  Plan for LTAC.       Safety Checklist:     DVT prophylaxis:  CHEST guideline (See  page e199S) Eliquis    Foley:  [place] Rn Foley protocol Yes   IVs:  PIV   PT/OT: Yes   Daily CBC & or Chem ordered:  SHM/ABIM guidelines (see #5) Yes   Reference for approximate charges of common labs: CBC auto diff - $76  BMP - $99  Mg - $79    Lines:     Patient Lines/Drains/Airways Status    Active PICC Line / CVC Line / PIV Line / Drain / Airway / Intraosseous Line / Epidural Line / ART Line / Line / Wound / Pressure Ulcer / NG/OG Tube     Name:   Placement date:   Placement time:   Site:   Days:    Peripheral IV 08/29/15 Left Hand  08/29/15    1345   Hand   11    Peripheral IV 08/30/15 Right Antecubital  08/30/15   0030   Antecubital   10    Peripheral IV 09/08/15 Left Forearm  09/08/15   0400   Forearm   1    Peripheral IV 09/08/15 Right Hand  09/08/15   0400   Hand   1    Peripheral IV 09/08/15 Left Forearm  09/08/15   2200   Forearm   less than 1    Gastrostomy/Enterostomy Gastrostomy-jejunostomy  09/04/15         5    Urethral Catheter Temperature probe 16 Fr.  09/07/15   1300   Temperature probe   2    External Urinary Catheter  09/06/15   1230      3    Surgical Airway Shiley 6 mm  09/08/15   1110   6 mm   1    Wound 08/25/15 Pressure Injury Buttocks Left;Right Non-Blanchable Erythema  08/25/15   2258   Buttocks   14    Incision Site 08/20/15 Neck  08/20/15   1427     19    Incision Site 08/20/15 Neck  08/20/15   1932     19    Incision Site 08/28/15 Neck  08/28/15   2352     11    Incision Site 08/29/15 Neck  08/29/15   0515     11                 Disposition:     Today's date: 09/23/2015  Length of Stay: 37  Anticipated medical stability for discharge: 3- 4 days  Reason for ongoing hospitalization: quadriparesis  Anticipated discharge needs: TBD    Subjective     CC: Quadriplegia    Interval History/24 hour events: please see below    HPI/Subjective: Ernest Diaz is a 70 y.o. male hx afib on Eliquis + aspirin, HTN who presented to [facility] 2/25 after a fall. CT head and spine were negative so he was discharged. 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 MRI cervical and thoracic spine showed spinal epidural hematoma extending from the foramen magnum to the upper thoracic spine. Neurosurgery (Dr. Jaynie Collins) was consulted. His Eliquis was reversed. He ultimately had C3-C5 and T3-T4 decompressive laminectomies 2/27 by Dr. Jaynie Collins. He noted extensive dorsal epidural venous plexus and when he dissected out these vessels they bled. Dr. Jaynie Collins requested he be transferred here for spinal angiogram. These symptoms are sudden onset,  severe intensity, without alleviating factors.     3/29. More stable at [facility] ICU neurologically, stable at trach collar, no fever/diarrhea. Tolerating tube feeding.  Follows commands. Not in any distress.   3/30. More alert and awake, follows commands. No any acute events noted. Family at bed side.  3/31. Had desaturation intermittently since last night, had significant secretion in his trach suctioning. No fever. More alert and awake. Not in distress now, more comfort with PS. Has mild discomfort at trach site. No discharge from the trach site.  4/1. More alert, no any acute events noted. On PS mode. No fever, diarrhea and respiratory distress.  4/2. Has intermittent chest pain and SOB. Had low grade fever today. No cough, diarrhea, abdominal pain and chills. Not in distress. Follows commands  4/3. no any acute events noted. Still has been on PS, no fever today. More stable.    4/4: No acute events overnight.  FiO2 increased slightly to 50%.  Requests a shave today and states pain at neck site is better today.  Denies other acute concerns/complaints at this time.    4/5: No acute events overnight.  Denies chest pain, shortness of breath, abdominal pain at this time.  Still with weak phonation.  Secretions more improved today.  Frustrated with weak phonation and inability to communicate.  Denies other acute concerns/complaints at this time.   4/6: Patient doing better today.  Denies chest pain, shortness of breath, abdominal pain at this time.  Able to communicate with PMSV today.  Secretions continue improving.  Plan of care discussed with family including POA son Mick at the bedside.  No acute events overnight.    4/7: This AM, had episode of becoming bradycardic and hypoxic when attempting trial with PMSV.  Required deep suctioning with lots of secretions present.  O2 saturation and HR improved afterwards.  Denies chest pain, shortness of breath, abdominal pain.  Has been able to communicate with PMSV although  phonation weak.  Denies other acute concerns/complaints at this time.         4/8: No acute events overnight.  Denies chest pain, shortness of breath, abdominal pain.  Able to communicate with PMSV.  Still having copious secretions.  No episodes of desats.  Denies other acute concerns/complaints at this time.    4/9: No acute events overnight.  Agitated and pulling at lines/tubes however.  Reports he did not sleep well.  Tearful regarding medical condition and weakness in lower extremities.  Denies chest pain, shortness of breath, abdominal pain.  Denies other acute concerns/complaints at this time.  Plan of care discussed with wife via phone at [phone].   4/10: Patient has been off vent > 24 hours.  Still with copious secretions and desat to 80s overnight requiring aggressive suctioning.  Denies chest pain, shortness of breath, abdominal pain, N/V, diarrhea, headache.  States he is otherwise doing better.  Phonation stronger this AM with PMSV.  Will work with PT today.  Has some upper neck pain around C-collar however better controlled with increased po Morphine.  Reports lightheadedness/dizziness from a few days ago has resolved without acute intervention and has not recurred.      4/11:  Off vent overnight, however noted to have bradycardic episode with desaturation while working with OT.  Vitals improved after suctioning.  Patient currently asymptomatic, family at bedside. No focal complaints.    4/12:  Patient tolerating TC.  Per PT with bradycardia, hypotension and desaturation when sitting up in bed. Vitals return to baseline once patient supine in bed.  Family at bedside, patient without focal complaints    Physical Exam:     VITAL SIGNS  PHYSICAL EXAM   Temp:  [98.2 F (36.8 C)-99.1 F (37.3 C)] 98.8 F (37.1 C)  Heart Rate:  [53-77] 61  Resp Rate:  [14-43] 17  BP: (105-147)/(55-94) 105/55 mmHg  FiO2:  [40 %-50 %] 40 %    Intake/Output Summary (Last 24 hours) at 09/23/15 1341  Last data filed at  09/23/15 1200   Gross per 24 hour   Intake   1330 ml   Output   1605 ml   Net   -275 ml    Physical Exam  General: awake, alert X 3, not in distress  HEENT- NC/AT  Neck-  trach in place- site: dry and clean   Cardiovascular: irregularly irregular, no bradycardic murmurs, rubs or gallops  Lungs: coarse breath sounds and dull bilaterally, without wheezing or rales  Abdomen: soft, non-tender, non-distended; no palpable masses,  normoactive bowel sounds. PEG tube site- clean/dry/no leakage  Extremities: Trace b/l LE edema   Neuro: Cranial nerves- grossly intact, 4/5 in both upper extremities (diminshed grip strength more so on left), 0/5 in both lower extremities, follows commands, sensation intact in upper and diminished in b/l extremities        Meds:     Medications were reviewed:    Labs:     Labs (last 72 hours):      Recent Labs  Lab 09/23/15  0413 09/22/15  0326   WBC 4.62 5.01   HGB 10.8* 10.2*   HEMATOCRIT 35.4* 33.4*   PLATELETS 221 213            Recent Labs  Lab 09/23/15  0413 09/22/15  0326   SODIUM 140 137   POTASSIUM 4.4 4.3   CHLORIDE 96* 94*   CO2 36* 37*   BUN 12.0 12.0   CREATININE 0.5* 0.5*   CALCIUM 8.5 8.5   GLUCOSE 107* 116*                   Microbiology, reviewed  Microbiology Results     Procedure Component Value Units Date/Time    AFB culture and smear [[id number]] Collected:  08/29/15 0200    Specimen Information:  Sputum from Wound Updated:  09/14/15 2007    Narrative:      ORDER#: [id number]                                    ORDERED BY: Nicoletta Dress  SOURCE: Wound POSTERIOR CERVICAL WOUND               COLLECTED:  08/29/15 02:00  ANTIBIOTICS AT COLL.:                                RECEIVED :  08/29/15 05:54  Stain, Acid Fast                           FINAL       08/29/15 11:42  08/29/15   No Acid Fast Bacillus Seen  Culture Acid Fast Bacillus (AFB)           PRELIM      09/14/15 20:02  09/07/15   No growth after 1 week/s of incubation.  09/14/15   No growth after 2 week/s of incubation.       Anaerobic culture [[id number]] Collected:  08/29/15 0200  Specimen Information:  Other from Wound Updated:  09/02/15 0835    Narrative:      ORDER#: [id number]                                    ORDERED BY: Nicoletta Dress  SOURCE: Wound POSTERIOR CERVICAL WOUND               COLLECTED:  08/29/15 02:00  ANTIBIOTICS AT COLL.:                                RECEIVED :  08/29/15 05:54  Culture, Anaerobic Bacteria                FINAL       09/02/15 08:35  09/02/15   No anaerobic growth      CULTURE + Dierdre Forth [[id number]] Collected:  09/13/15 1047    Specimen Information:  Sputum from Endotracheal Updated:  09/14/15 1658    Narrative:      ORDER#: [id number]                                    ORDERED BY: Lemar Livings, EYAD  SOURCE: Endotracheal Aspirate lungs                  COLLECTED:  09/13/15 10:47  ANTIBIOTICS AT COLL.:                                RECEIVED :  09/13/15 11:56  Stain, Gram (Respiratory)                  FINAL       09/13/15 14:36  09/13/15   Many WBC's             Rare Squamous epithelial cells             Few Gram positive cocci  Culture and Gram Stain, Aerobic, RespiratorPRELIM      09/14/15 16:58  09/14/15   Moderate growth of mixed upper respiratory flora      CULTURE + Dierdre Forth [[id number]] Collected:  09/07/15 1240    Specimen Information:  Sputum from Bronchial Lavage Updated:  09/09/15 1324    Narrative:      ORDER#: [id number]                                    ORDERED BY: DINESCU, DAN  SOURCE: Bronchial Lavage bronchoscopy                COLLECTED:  09/07/15 12:40  ANTIBIOTICS AT COLL.:                                RECEIVED :  09/07/15 15:15  Stain, Gram (Respiratory)                  FINAL       09/07/15 17:13  09/07/15   Moderate WBC's             No Squamous epithelial cells  No organisms seen  Culture and Gram Stain, Aerobic, RespiratorFINAL       09/09/15 13:24  09/08/15   Light growth of mixed upper respiratory flora      CULTURE  + Dierdre Forth [[id number]] Collected:  09/02/15 1452    Specimen Information:  Sputum from Sputum, Suctioned Updated:  09/04/15 1426    Narrative:      ORDER#: [id number]                                    ORDERED BY: Alysia Penna  SOURCE: Sputum, Suctioned sputum                     COLLECTED:  09/02/15 14:52  ANTIBIOTICS AT COLL.:                                RECEIVED :  09/02/15 18:47  Stain, Gram (Respiratory)                  FINAL       09/02/15 22:14  09/02/15   Few WBC's             No Epithelial cells             Few Mixed Respiratory Flora  Culture and Gram Stain, Aerobic, RespiratorFINAL       09/04/15 14:26   +  09/03/15   Moderate growth of mixed upper respiratory flora  09/03/15   Light growth of Stenotrophomonas maltophilia      _____________________________________________________________________________                                    S.malto       ANTIBIOTICS                     MIC  INTRP      _____________________________________________________________________________  Ceftazidime                      4     S        Levofloxacin                    <=1    S        Trimethoprim/Sulfamethoxazole <=0.5/9  S        _____________________________________________________________________________            S=SUSCEPTIBLE     I=INTERMEDIATE     R=RESISTANT                            N/S=NON-SUSCEPTIBLE  _____________________________________________________________________________      CULTURE BLOOD AEROBIC AND ANAEROBIC [[id number]] Collected:  09/13/15 0328    Specimen Information:  Blood, Venipuncture Updated:  09/15/15 0721    Narrative:      ORDER#: [id number]                                    ORDERED BY: NGUYEN, HUY  SOURCE: Blood, Venipuncture stick                    COLLECTED:  09/13/15 03:28  ANTIBIOTICS AT  COLL.:                                RECEIVED :  09/13/15 06:22  Culture Blood Aerobic and Anaerobic        PRELIM      09/15/15 07:21  09/14/15   No Growth after 1  day/s of incubation.  09/15/15   No Growth after 2 day/s of incubation.      CULTURE BLOOD AEROBIC AND ANAEROBIC [[id number]] Collected:  08/24/15 0909    Specimen Information:  Blood, Venipuncture Updated:  08/29/15 1521    Narrative:      ORDER#: [id number]                                    ORDERED BY: Wynetta Fines, VIN  SOURCE: Blood, Venipuncture peripheral               COLLECTED:  08/24/15 09:09  ANTIBIOTICS AT COLL.:                                RECEIVED :  08/24/15 13:17  Culture Blood Aerobic and Anaerobic        FINAL       08/29/15 15:21  08/29/15   No growth after 5 days of incubation.      Fungus culture [[id number]] Collected:  08/29/15 0200    Specimen Information:  Other from Wound Updated:  09/09/15 1529    Narrative:      ORDER#: [id number]                                    ORDERED BY: Nicoletta Dress  SOURCE: Wound POSTERIOR CERVICAL WOUND               COLLECTED:  08/29/15 02:00  ANTIBIOTICS AT COLL.:                                RECEIVED :  08/29/15 05:54  Stain, Fungal                              FINAL       08/29/15 12:01  08/29/15   No Fungal or Yeast Elements Seen  Culture Fungus                             PRELIM      09/09/15 15:28  09/09/15   No growth after 1 week/s of incubation.      Legionella antigen, urine [[id number]] Collected:  09/14/15 0326    Specimen Information:  Urine from Urine, Catheterized, Foley Updated:  09/14/15 0559    Narrative:      ORDER#: [id number]                                    ORDERED BY: Cyndie Chime, HUY  SOURCE: Urine, Catheterized, Foley                   COLLECTED:  09/14/15 03:26  ANTIBIOTICS AT COLL.:                                RECEIVED :  09/14/15 04:23  Legionella, Rapid Urinary Antigen          FINAL       09/14/15 05:58  09/14/15   Negative for Legionella pneumophila Serogroup 1 Antigen             Limitations of Test:             1. Negative results do not exclude infection with Legionella                pneumophila Serogroup 1.             2. Does  not detect other serogroups of L. pneumophila                or other Legionella species.             Test Reference Range: Negative      MRSA culture [[id number]] Collected:  08/17/15 2043    Specimen Information:  Body Fluid from Nasal/Throat ASC Admission Updated:  08/19/15 0328    Narrative:      ORDER#: [id number]                                    ORDERED BY: Paulita Fujita  SOURCE: Nares and Throat                             COLLECTED:  08/17/15 20:43  ANTIBIOTICS AT COLL.:                                RECEIVED :  08/18/15 03:03  Culture MRSA Surveillance                  FINAL       08/19/15 03:28  08/19/15   Negative for Methicillin Resistant Staph aureus from Nares and             Negative for Methicillin Resistant Staph aureus from Throat      Rapid influenza A/B antigens [[id number]] Collected:  08/31/15 1649    Specimen Information:  Nasopharyngeal from Nasal Aspirate Updated:  08/31/15 1727    Narrative:      ORDER#: [id number]                                    ORDERED BY: Sterling Big  SOURCE: Nasal Aspirate                               COLLECTED:  08/31/15 16:49  ANTIBIOTICS AT COLL.:                                RECEIVED :  08/31/15 17:04  Influenza Rapid Antigen A&B                FINAL       08/31/15 17:27  08/31/15   Negative for Influenza A and B  Reference Range: Negative      Urine culture [[id number]] Collected:  09/13/15 1821    Specimen Information:  Urine from Urine, Catheterized, In & Out Updated:  09/14/15 1851    Narrative:      ORDER#: [id number]                                    ORDERED BY: Cyndie Chime, HUY  SOURCE: Urine, Catheterized, In & Out                COLLECTED:  09/13/15 18:21  ANTIBIOTICS AT COLL.:                                RECEIVED :  09/13/15 21:14  Culture Urine                              FINAL       09/14/15 18:51  09/14/15   No growth of >1,000 CFU/ML, No further work      Urine culture [[id number]] Collected:  08/24/15 1718    Specimen Information:   Urine from Urine, Catheterized, Foley Updated:  08/26/15 1544    Narrative:      ORDER#: [id number]                                    ORDERED BY: Alysia Penna  SOURCE: Urine, Catheterized, Foley                   COLLECTED:  08/24/15 17:18  ANTIBIOTICS AT COLL.:                                RECEIVED :  08/24/15 23:42  Culture Urine                              FINAL       08/26/15 15:43   +  08/26/15   >100,000 CFU/ML Enterococcus faecalis      _____________________________________________________________________________                                   E.faecalis     ANTIBIOTICS                     MIC  INTRP      _____________________________________________________________________________  Ampicillin                       1     S        Gentamicin High Level Resistan <=500   S        Levofloxacin                    <=1    S        Nitrofurantoin                 <=16    S  D1    Penicillin  4     S        Streptomycin High Level Resist<=1000   S        Tetracycline                    >8     R        Vancomycin                       2     S          -----DRUG COMMENTS----------    D1:  Nitrofurantoin should only be used for the treatment of         uncomplicated cystitis.         [employer] June 2015  _____________________________________________________________________________            S=SUSCEPTIBLE     I=INTERMEDIATE     R=RESISTANT                            N/S=NON-SUSCEPTIBLE  _____________________________________________________________________________      Wound culture & gram stain [[id number]] Collected:  08/29/15 0200    Specimen Information:  Wound from Wound Updated:  08/31/15 1209    Narrative:      ORDER#: [id number]                                    ORDERED BY: Nicoletta Dress  SOURCE: Wound POSTERIOR CERVICAL WOUND               COLLECTED:  08/29/15 02:00  ANTIBIOTICS AT COLL.:                                RECEIVED :  08/29/15 05:54  Stain,  Gram                                FINAL       08/29/15 08:45  08/29/15   No Squamous epithelial cells seen             Rare WBCs             No organisms seen  Culture and Gram Stain, Aerobic, Wound     FINAL       08/31/15 12:09   +  08/31/15   Very light growth of mixed cutaneous flora            Imaging, reviewed  Ct Cervical Spine Wo Contrast    09/22/2015  1. Extensive anterior and posterior cervical spine surgery. Please see the above discussion. Otho Ket, MD 09/22/2015 6:08 PM         Signed by: Les Pou, MD

## 2015-09-23 NOTE — Progress Notes (Signed)
ID PROGRESS NOTE    Date Time: 09/23/2015 8:16 AM  Patient Name: [facility]        Subjective, ROS:    In [facility]    Tmax 99.1, hemodynamically stable    Stable labs     Nurse at bedside    Episode of desaturation this morning  Better now   Dizziness +, no headache   Denies SOB, secretions clear/bloody   No vomiting   No diarrhea       Antibiotics and culture results:   Antibiotics: off antibiotics   Midodrine   S/P vanc , zosyn , ceftazidime       Cultures:  4/3 legionella antigen negative   4/2 Ucx Ngtd  4/2 sputum cx mod mixed upper flora   4/2 Blood cx Ngtd  3/27 BAL cx upper flora   3/22 sputum cx Stenotrophomonas maltophilia , mixed upper flora    3/20 flu negative   3/18 cervical wound cx cut flora  3/18 cervical wound anaerobic cx ngtd  3/18 cervical wound AFB and fungal cx Ngtd  3/13 Ucx >100,000 E. Faecalis   3/13 Blood cx Ngtd  3/6 MRSA negative     3/9 path FIBROVASCULAR TISSUE, FEW VESSELS AND BONE CHIPS       Lines: PIV access    Physical Exam:     Filed Vitals:    09/23/15 0800   BP: 117/67   Pulse: 66   Temp: 99.1 F (37.3 C)   Resp: 14   SpO2: 100%       General: in bed, in [facility], awake and ommunicative. Off vent , no distress.   Nurse  at bedside   HEENT: dry mucosa ,  pale, not icteric , cervical collar in place , trach in place   Chest: S1S2 irregular , coarse BS bilaterally,  no wheeze , BS at bases decreased.   Abdomen: soft, not tender , BS +, PEG in place   Ext: + edema   Weakness  , no movement in legs   Foley in place       Labs:       Recent CBC WITH DIFF   Recent Labs      09/23/15   0413   WBC  4.62   RBC  3.53*   HGB  10.8*   HEMATOCRIT  35.4*   MCV  100.3*       Recent CMP   Recent Labs      09/23/15   0413   GLUCOSE  107*   BUN  12.0   CREATININE  0.5*   SODIUM  140   POTASSIUM  4.4   CHLORIDE  96*   CO2  36*           Rads:   4/11 CT Extensive anterior and posterior cervical spine surgery    4/11 xray No significant changes. Moderate to large bilateral pleural  effusions and associated atelectasis.            4/2 CT No CT evidence of pulmonary embolism.Large bilateral pleural effusions with adjacent infiltrates worrisome for pneumonia    4/2 doppler Thrombosis right profunda femoral vein. Thrombosis left greater saphenous vein    3/23 sono Similar findings as seen on recent CT. Thick-walled bladder without significant distention with pericholecystic fluid. Lack of gallstones and clear focal tenderness makes cholecystitis less likely. The patient was uncooperative for this portable examination however. Differential considerations would include hypoalbuminemia, chronic cholecystitis or acute cholecystitis. If continued  clinical concern can perform HIDA scan. Bilateral effusions. Patient refused renal evaluation.    3/22 CT  Decompressed stomach located within the anterior abdomen extending  below the left hepatic lobe margin and above the transverse colon.. Nonspecific gallbladder wall thickening with pericholecystic fluid and stranding in the presence of small ascites. Recommend clinical correlation for acute cholecystitis.. Bilateral small effusions and basilar lower lobe consolidative atelectasis. Cardiac enlargement.    3/20 IRSuccessful deployment of a retrievable inferior vena cava filter    3/19 doppler Nonocclusive deep venous thrombosis involving a short segment of the proximal femoral vein.    3/17 CT  Markedly abnormal examination with anterolisthesis of C6 upon C7  measuring 16 mm with a jumped facet on the left and a comminuted displaced fracture involving the right C7 articular facet. Anterior cervical spinal fusion hardware is malpositioned with both plate and screws extending into the C6-C7 disc space.  The AP dimension of the bony spinal canal at the superior C5 level is reduced to 5 mm.       3/8 MRI Postsurgical change with prior laminectomy. 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 There findings  suspicious for a disc ligamentous injury at this level. Correlation with CT is advised    3/7 IR No angiographic evidence of intracranial aneurysm, arteriovenous malformation or dural fistula. Is no angiographic evidence of spinal arteriovenous malformation or dural fistula.  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   - worsening of weakness due to Fracture dislocation C6-C7 with malpositioned hardware  - S/P C5-T1 posterior fusion, C5-C7 anterior fusion with partial C7 corpectomy on 3/18. cx normal flora     Other issues during admission:  SIRS    Recurrent aspiration pneumonia and hypoxic respiratory failure (cx stenotrophomonas , normal flora)  mild-mod oropharyngeal dysphagia   Urinary retention, required reinsertion of foley and E. Faecalis UTI  Leukocytosis  DVT (S/P IVC filter)  Gallbladder wall thickening per imaging       Plan:   1. SIRS.   Due to UTI and aspiration pneumonia.  Recent fever could be due to pneumonia/also DVT    Now afebrile and overall hemodynamically stable      2. Recurrent Hypoxic respiratory failure and aspiration pneumonia . Sputum cx: normal flora and stenotrophomonas   Episode on 3/21-22 due to aspiration/malositioned [place] .  Mucous plug after extubation 3/27 , S/P intubation and bronch   S/P trach 3/28 .    Finished the last course of antibiotics 4/10.   Secretions better, still some episodes of desaturation.   Last xray Moderate to large bilateral pleural effusions and associated atelectasis.    Monitor respiratory status closely       3. 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   S/P C5-T1 posterior fusion, C5-C7 anterior fusion with partial C7 corpectomy on 3/18. cx normal flora     4. Leukocytosis.   Due to above.   Resolved.    5. Monitor dizziness    Discussed with the patient and nursing      Signed by: [place]. [id number]  Infectious Disease  Phone: [phone]  Fax: [phone]

## 2015-09-23 NOTE — Plan of Care (Signed)
Problem: Neurological Deficit  Goal: Neurological status is stable or improving  Intervention: Maintain safe environment  Pt is restless, confused, at times, mouths words, and talks when speaking valve on; moves BUE, but no fine motor skills. On TM 40%, 8L, desated a few times, deep suction done, good result. On Afib, rate controled, normotensive, 110's-140's, no edema, pulses palpable on all extremities , foley in place for retention, foley care done, AUOP, no BM. TF TwoCalHN running at 53ml/hr, with 200 ml WF q6h. Purposeful rounding completed, all meds given as ordered, will continue.

## 2015-09-23 NOTE — Progress Notes (Signed)
PROGRESS NOTE    Date Time: 09/23/2015 1:06 PM  Patient Name: Ernest Diaz      Assessment:   1. Respiratory failure status post tracheostomy.  2. Recurrent mucus plugging with recent pneumonia.  3. 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  4. Pleural effusion.  5.  DVT or right LE.    Plan:   Off vent for several days. Now on Trach collar, keep as tolerated.   Less secretions noted overnight and today.  Cont robinul and scopolamine.  Occasional desaturation requiring suctioning via trach.  Completed ceftaz course.  Afebrile.  Reculture sputum negative.  Able to tolerate PMSV with good phonation.  Cont Duonebs.  CTA neg for PE. Large bilateral effusions. Would hold off on tapping unless fever is persistent. F/U CXR shows improved pleural effusions.  Cont to diurese with lasix.  Repeat dopplers show thrombus in profunda femoral vein on right and superficial femoral vein on left. Eliquis started for cardiac reasons as well as DVT at 5mg  bid. Would continue. OK by Neurosurgery.  Episode of severe bradycardia with desaturation, occurred while sitting pt up, likely due to mucus plugging. Resolved with suctioning.  I feel pt would need to go to [facility] on discharge given his frequent desats. Stable for discharge to LTAC from [facility] perspective.    Subjective:   Remains off vent.     Medications:     Current Facility-Administered Medications   Medication Dose Route Frequency   . albuterol-ipratropium  3 mL Nebulization Q6H SCH   . apixaban  5 mg per NG tube Q12H [facility]    And   . dextrose  60 mL Nasogastric Q12H SCH   . balsam peru-castor oil (VENELEX)   Topical Q12H SCH   . famotidine  20 mg Oral Q12H SCH    Or   . famotidine  20 mg Intravenous Q12H SCH   . fentaNYL  1 patch Transdermal Q72H   . furosemide  20 mg Oral BID   . gabapentin  200 mg per G tube QHS   . glycopyrrolate  1 mg Oral Q8H SCH   . lactobacillus/streptococcus  2 capsule Oral Daily   . methocarbamol  750 mg Oral Q8H SCH   .  midodrine  10 mg Oral TID MEALS   . QUEtiapine  25 mg Oral QHS   . scopolamine  1 patch Transdermal Q72H   . senna-docusate  1 tablet Oral QHS   . terazosin  1 mg Oral QHS       Review of Systems:   A comprehensive review of systems was: Negative    Physical Exam:     Filed Vitals:    09/23/15 1200   BP: 105/55   Pulse: 61   Temp: 98.8 F (37.1 C)   Resp: 17   SpO2: 98%       Intake and Output Summary (Last 24 hours) at Date Time    Intake/Output Summary (Last 24 hours) at 09/23/15 1306  Last data filed at 09/23/15 1200   Gross per 24 hour   Intake   1330 ml   Output   1605 ml   Net   -275 ml       General appearance - acyanotic, in no respiratory distress  Mental status - drowsy  Eyes - pupils equal and reactive, extraocular eye movements intact  Ears - not examined  Nose - normal and patent, no erythema, discharge or polyps  Mouth - mucous membranes moist, pharynx  normal without lesions  Neck - supple, no significant adenopathy and neck collar in place  Lymphatics - no palpable lymphadenopathy, no hepatosplenomegaly  Chest - rhonchi noted bilaterally  Heart - normal rate, regular rhythm, normal S1, S2, no murmurs, rubs, clicks or gallops  Abdomen - soft, nontender, nondistended, no masses or organomegaly  Extremities - peripheral pulses normal, no pedal edema, no clubbing or cyanosis    Labs:     Results     Procedure Component Value Units Date/Time    CBC and differential [[id number]]  (Abnormal) Collected:  09/23/15 0413    Specimen Information:  Blood from Blood Updated:  09/23/15 0507     WBC 4.62 x10 3/uL      Hgb 10.8 (L) g/dL      Hematocrit 40.9 (L) %      Platelets 221 x10 3/uL      RBC 3.53 (L) x10 6/uL      MCV 100.3 (H) fL      MCH 30.6 pg      MCHC 30.5 (L) g/dL      RDW 15 %      MPV 11.2 fL      Neutrophils 55 %      Lymphocytes Automated 34 %      Monocytes 9 %      Eosinophils Automated 1 %      Basophils Automated 0 %      Immature Granulocyte 0 %      Nucleated RBC 0 /100 WBC      Neutrophils  Absolute 2.54 x10 3/uL      Abs Lymph Automated 1.59 x10 3/uL      Abs Mono Automated 0.42 x10 3/uL      Abs Eos Automated 0.06 x10 3/uL      Absolute Baso Automated 0.00 x10 3/uL      Absolute Immature Granulocyte 0.01 x10 3/uL     Basic Metabolic Panel [[id number]]  (Abnormal) Collected:  09/23/15 0413    Specimen Information:  Blood Updated:  09/23/15 0456     Glucose 107 (H) mg/dL      BUN 95.6 mg/dL      Creatinine 0.5 (L) mg/dL      Calcium 8.5 mg/dL      Sodium 213 mEq/L      Potassium 4.4 mEq/L      Chloride 96 (L) mEq/L      CO2 36 (H) mEq/L     Magnesium [[id number]] Collected:  09/23/15 0413    Specimen Information:  Blood Updated:  09/23/15 0456     Magnesium 1.9 mg/dL     GFR [[id number]] Collected:  09/23/15 0413     EGFR >60.0 Updated:  09/23/15 0456          Recent CBC   Recent Labs      09/23/15   0413   RBC  3.53*   HGB  10.8*   HEMATOCRIT  35.4*   MCV  100.3*   MCH  30.6   MCHC  30.5*   RDW  15   MPV  11.2     Recent BMP   Recent Labs      09/23/15   0413   GLUCOSE  107*   BUN  12.0   CREATININE  0.5*   CALCIUM  8.5   SODIUM  140   POTASSIUM  4.4   CHLORIDE  96*   CO2  36*       Rads:   Radiological  Procedure reviewed.    Signed by: Sherrie Mustache, MD

## 2015-09-23 NOTE — Progress Notes (Addendum)
Case Management Note:    SW spoke with the patient's son and patient's wife at bedside to discuss the recommendations for discharge.The family would like to look for rehab near [facility]. SW provided a list of LTACH's in the [place] area. Patient's son gave top three choices.    1.[facility]  2. [facility]  3. [facility]       SW faxed clinicals to [facility] in [place] [phone]. SW spoke with admissions liaison Blake Divine) , they do not anticipate an open bed until next week and they have an extensive waiting list. SW faxed clinicals to [facility] at [phone] and left a voice message for their admissions department at [phone]. SW received a call from [facility], they are able to accept the patient as early as tomorrow. SW updated the family and provided admissions contact information for [facility].     SW will continue to follow for discharge needs and placement.        Everardo Pacific, MSW  Care Coordinator  [facility]  [phone]  [email or web address]

## 2015-09-23 NOTE — Progress Notes (Signed)
Cervical CT shows intact anterior and posterior fusion constructs.   Alignment is maintained and the spinal canal is patent.    Given above, we can allow the Aspen collar to be off for hygiene when in bed at rest as needed.   Continue Aspen collar for mobilization and when OOB.    May be discharged to rehab from my standpoint.  Follow up with me once released from rehab.       Rella Larve, MD

## 2015-09-24 LAB — CBC AND DIFFERENTIAL
Basophils Absolute Automated: 0.01 10*3/uL (ref 0.00–0.20)
Basophils Automated: 0 %
Eosinophils Absolute Automated: 0.05 10*3/uL (ref 0.00–0.70)
Eosinophils Automated: 1 %
Hematocrit: 36.7 % — ABNORMAL LOW (ref 42.0–52.0)
Hgb: 11 g/dL — ABNORMAL LOW (ref 13.0–17.0)
Immature Granulocytes Absolute: 0.01 10*3/uL
Immature Granulocytes: 0 %
Lymphocytes Absolute Automated: 1.59 10*3/uL (ref 0.50–4.40)
Lymphocytes Automated: 35 %
MCH: 30.2 pg (ref 28.0–32.0)
MCHC: 30 g/dL — ABNORMAL LOW (ref 32.0–36.0)
MCV: 100.8 fL — ABNORMAL HIGH (ref 80.0–100.0)
MPV: 11.3 fL (ref 9.4–12.3)
Monocytes Absolute Automated: 0.4 10*3/uL (ref 0.00–1.20)
Monocytes: 9 %
Neutrophils Absolute: 2.44 10*3/uL (ref 1.80–8.10)
Neutrophils: 54 %
Nucleated RBC: 0 /100 WBC (ref 0–1)
Platelets: 214 10*3/uL (ref 140–400)
RBC: 3.64 10*6/uL — ABNORMAL LOW (ref 4.70–6.00)
RDW: 15 % (ref 12–15)
WBC: 4.5 10*3/uL (ref 3.50–10.80)

## 2015-09-24 LAB — BASIC METABOLIC PANEL
BUN: 11 mg/dL (ref 9.0–28.0)
CO2: 35 mEq/L — ABNORMAL HIGH (ref 22–29)
Calcium: 8.8 mg/dL (ref 7.9–10.2)
Chloride: 98 mEq/L — ABNORMAL LOW (ref 100–111)
Creatinine: 0.5 mg/dL — ABNORMAL LOW (ref 0.7–1.3)
Glucose: 111 mg/dL — ABNORMAL HIGH (ref 70–100)
Potassium: 4.3 mEq/L (ref 3.5–5.1)
Sodium: 141 mEq/L (ref 136–145)

## 2015-09-24 LAB — MAGNESIUM: Magnesium: 1.9 mg/dL (ref 1.6–2.6)

## 2015-09-24 LAB — GFR: EGFR: >60

## 2015-09-24 MED ORDER — PHENOL 1.4 % MT LIQD
1.0000 | OROMUCOSAL | Status: AC | PRN
Start: 2015-09-24 — End: ?

## 2015-09-24 MED ORDER — MIDODRINE HCL 10 MG PO TABS
20.0000 mg | ORAL_TABLET | Freq: Three times a day (TID) | ORAL | Status: AC
Start: 2015-09-24 — End: ?

## 2015-09-24 MED ORDER — TERAZOSIN HCL 1 MG PO CAPS
1.0000 mg | ORAL_CAPSULE | Freq: Every evening | ORAL | Status: AC
Start: 2015-09-24 — End: ?

## 2015-09-24 MED ORDER — GLYCOPYRROLATE 1 MG PO TABS
1.0000 mg | ORAL_TABLET | Freq: Three times a day (TID) | ORAL | Status: AC
Start: 2015-09-24 — End: ?

## 2015-09-24 MED ORDER — MIDODRINE HCL 5 MG PO TABS
20.0000 mg | ORAL_TABLET | Freq: Three times a day (TID) | ORAL | Status: DC
Start: 2015-09-24 — End: 2015-09-24
  Administered 2015-09-24: 20 mg via ORAL
  Filled 2015-09-24: qty 4

## 2015-09-24 MED ORDER — FAMOTIDINE 20 MG PO TABS
20.0000 mg | ORAL_TABLET | Freq: Two times a day (BID) | ORAL | Status: AC
Start: 2015-09-24 — End: ?

## 2015-09-24 MED ORDER — FUROSEMIDE 20 MG PO TABS
20.0000 mg | ORAL_TABLET | Freq: Two times a day (BID) | ORAL | Status: AC
Start: 2015-09-24 — End: ?

## 2015-09-24 MED ORDER — BISACODYL 10 MG RE SUPP
10.0000 mg | Freq: Every day | RECTAL | Status: AC | PRN
Start: 2015-09-24 — End: ?

## 2015-09-24 MED ORDER — MIDODRINE HCL 5 MG PO TABS
10.0000 mg | ORAL_TABLET | Freq: Three times a day (TID) | ORAL | Status: DC
Start: 2015-09-24 — End: 2015-09-24

## 2015-09-24 MED ORDER — GABAPENTIN 100 MG PO CAPS
200.0000 mg | ORAL_CAPSULE | Freq: Every evening | ORAL | Status: AC
Start: 2015-09-24 — End: ?

## 2015-09-24 MED ORDER — ALBUTEROL-IPRATROPIUM 2.5-0.5 (3) MG/3ML IN SOLN
3.0000 mL | Freq: Four times a day (QID) | RESPIRATORY_TRACT | Status: AC
Start: 2015-09-24 — End: ?

## 2015-09-24 MED ORDER — VENELEX EX OINT
TOPICAL_OINTMENT | Freq: Two times a day (BID) | CUTANEOUS | Status: AC
Start: 2015-09-24 — End: ?

## 2015-09-24 MED ORDER — APIXABAN 5 MG PO TABS
5.0000 mg | ORAL_TABLET | Freq: Two times a day (BID) | ORAL | Status: AC
Start: 2015-09-24 — End: ?

## 2015-09-24 MED ORDER — RISAQUAD PO CAPS
2.0000 | ORAL_CAPSULE | Freq: Every day | ORAL | Status: AC
Start: 2015-09-24 — End: ?

## 2015-09-24 MED ORDER — FENTANYL 50 MCG/HR TD PT72
1.0000 | MEDICATED_PATCH | TRANSDERMAL | Status: DC
Start: 2015-09-24 — End: 2017-09-28

## 2015-09-24 MED ORDER — SCOPOLAMINE 1 MG/3DAYS TD PT72
1.0000 | MEDICATED_PATCH | TRANSDERMAL | Status: AC
Start: 2015-09-24 — End: ?

## 2015-09-24 MED ORDER — QUETIAPINE FUMARATE 25 MG PO TABS
25.0000 mg | ORAL_TABLET | Freq: Every evening | ORAL | Status: AC
Start: 2015-09-24 — End: ?

## 2015-09-24 MED ORDER — METHOCARBAMOL 750 MG PO TABS
750.0000 mg | ORAL_TABLET | Freq: Three times a day (TID) | ORAL | Status: AC
Start: 2015-09-24 — End: ?

## 2015-09-24 MED ORDER — MORPHINE SULFATE (CONCENTRATE) 20 MG/ML PO SOLN
15.0000 mg | ORAL | Status: AC | PRN
Start: 2015-09-24 — End: ?

## 2015-09-24 NOTE — Progress Notes (Signed)
Pt ordered for [place] [facility] this PM to [place]----  Belongings given to pt's family, [place] papers in brown envelope handed to transport/EMS; pt's VS taken, suctioned tru trach & inner cannula changed; maintained on 40%FiO2 8L [place], O2 sat 98-99% thru trach mask/ventimask, ACE cervical brace applied on pt; report given by phone to [place] LT facility; pt & familt denied any complaints prior to [place]. Pt EDC's thru ambu/EMS.

## 2015-09-24 NOTE — Progress Notes (Incomplete)
Pt

## 2015-09-24 NOTE — Progress Notes (Signed)
CCM spoke with Dr. Lemar Livings by phone asking if the patient is stable to transport 8 hours in an ambulance to go to an [facility] in [place] and he will document that the patient may travel 8 hours to [facility] RN, MSN  Clinical Case Manager   [facility]   Email: [email or web address]  Spectralink: Monday-Friday [phone] on Weekends call x [phone] Thank You

## 2015-09-24 NOTE — Progress Notes (Addendum)
PROGRESS NOTE    Date Time: 09/24/2015 12:23 PM  Patient Name: [facility]      Assessment:   1. Respiratory failure status post tracheostomy.  2. Recurrent mucus plugging with recent pneumonia.  3. 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  4. Pleural effusion.  5.  DVT or right LE.    Plan:   Off vent for several days. Now on Trach collar, keep as tolerated.   Less secretions noted overnight and today.  Cont robinul and scopolamine.  Occasional desaturation requiring suctioning via trach.  Completed ceftaz course.  Afebrile.  Reculture sputum negative.  Able to tolerate PMSV with good phonation.  Cont Duonebs.  Severely orthostatic on sitting up with PT and c/o dizziness, likely due to lack of sympathetic activity. Increase midodrine.  CTA neg for PE. Large bilateral effusions. Would hold off on tapping unless fever is persistent. F/U CXR shows improved pleural effusions.  Cont to diurese with lasix.  Repeat dopplers show thrombus in profunda femoral vein on right and superficial femoral vein on left. Eliquis started for cardiac reasons as well as DVT at 5mg  bid. Would continue. OK by Neurosurgery.  Episode of severe bradycardia with desaturation, occurred while sitting pt up, likely due to mucus plugging. Resolved with suctioning.  I feel pt would need to go to [facility] on discharge given his frequent desats. Stable for discharge to LTAC from [facility] perspective.  He can tolerate an 8 hour trip by ambulance from a Pulm perspective.    Subjective:   Remains off vent.     Medications:     Current Facility-Administered Medications   Medication Dose Route Frequency   . albuterol-ipratropium  3 mL Nebulization Q6H SCH   . apixaban  5 mg per NG tube Q12H [facility]    And   . dextrose  60 mL Nasogastric Q12H SCH   . balsam peru-castor oil (VENELEX)   Topical Q12H SCH   . famotidine  20 mg Oral Q12H SCH    Or   . famotidine  20 mg Intravenous Q12H SCH   . fentaNYL  1 patch Transdermal Q72H   .  furosemide  20 mg Oral BID   . gabapentin  200 mg per G tube QHS   . glycopyrrolate  1 mg Oral Q8H SCH   . lactobacillus/streptococcus  2 capsule Oral Daily   . methocarbamol  750 mg Oral Q8H SCH   . midodrine  10 mg Oral TID MEALS   . QUEtiapine  25 mg Oral QHS   . scopolamine  1 patch Transdermal Q72H   . senna-docusate  1 tablet Oral QHS   . terazosin  1 mg Oral QHS       Review of Systems:   A comprehensive review of systems was: Negative    Physical Exam:     Filed Vitals:    09/24/15 0846   BP:    Pulse: 61   Temp:    Resp: 19   SpO2: 100%   143/68  97.9    Intake and Output Summary (Last 24 hours) at Date Time    Intake/Output Summary (Last 24 hours) at 09/24/15 1223  Last data filed at 09/24/15 0600   Gross per 24 hour   Intake    810 ml   Output   1825 ml   Net  -1015 ml       General appearance - acyanotic, in no respiratory distress  Mental status - drowsy  Eyes - pupils equal and reactive, extraocular eye movements intact  Ears - not examined  Nose - normal and patent, no erythema, discharge or polyps  Mouth - mucous membranes moist, pharynx normal without lesions  Neck - supple, no significant adenopathy and neck collar in place  Lymphatics - no palpable lymphadenopathy, no hepatosplenomegaly  Chest - rhonchi noted bilaterally  Heart - normal rate, regular rhythm, normal S1, S2, no murmurs, rubs, clicks or gallops  Abdomen - soft, nontender, nondistended, no masses or organomegaly  Extremities - peripheral pulses normal, no pedal edema, no clubbing or cyanosis    Labs:     Results     Procedure Component Value Units Date/Time    Basic Metabolic Panel [[id number]]  (Abnormal) Collected:  09/24/15 0243    Specimen Information:  Blood Updated:  09/24/15 0403     Glucose 111 (H) mg/dL      BUN 64.4 mg/dL      Creatinine 0.5 (L) mg/dL      Calcium 8.8 mg/dL      Sodium 034 mEq/L      Potassium 4.3 mEq/L      Chloride 98 (L) mEq/L      CO2 35 (H) mEq/L     Magnesium [[id number]] Collected:  09/24/15 0243     Specimen Information:  Blood Updated:  09/24/15 0403     Magnesium 1.9 mg/dL     GFR [[id number]] Collected:  09/24/15 0243     EGFR >60.0 Updated:  09/24/15 0403    CBC and differential [[id number]]  (Abnormal) Collected:  09/24/15 0243    Specimen Information:  Blood from Blood Updated:  09/24/15 0324     WBC 4.50 x10 3/uL      Hgb 11.0 (L) g/dL      Hematocrit 30.1 (L) %      Platelets 214 x10 3/uL      RBC 3.64 (L) x10 6/uL      MCV 100.8 (H) fL      MCH 30.2 pg      MCHC 30.0 (L) g/dL      RDW 15 %      MPV 11.3 fL      Neutrophils 54 %      Lymphocytes Automated 35 %      Monocytes 9 %      Eosinophils Automated 1 %      Basophils Automated 0 %      Immature Granulocyte 0 %      Nucleated RBC 0 /100 WBC      Neutrophils Absolute 2.44 x10 3/uL      Abs Lymph Automated 1.59 x10 3/uL      Abs Mono Automated 0.40 x10 3/uL      Abs Eos Automated 0.05 x10 3/uL      Absolute Baso Automated 0.01 x10 3/uL      Absolute Immature Granulocyte 0.01 x10 3/uL     Fungus culture [[id number]] Collected:  08/29/15 0200    Specimen Information:  Other from Wound Updated:  09/23/15 1528    Narrative:      ORDER#: [id number]                                    ORDERED BY: Nicoletta Dress  SOURCE: Wound POSTERIOR CERVICAL WOUND               COLLECTED:  08/29/15 02:00  ANTIBIOTICS AT  COLL.:                                RECEIVED :  08/29/15 05:54  Stain, Fungal                              FINAL       08/29/15 12:01  08/29/15   No Fungal or Yeast Elements Seen  Culture Fungus                             PRELIM      09/23/15 15:27  09/09/15   No growth after 1 week/s of incubation.  09/16/15   No growth after 2 week/s of incubation.  09/23/15   No growth after 3 week/s of incubation.            Recent CBC   Recent Labs      09/24/15   0243   RBC  3.64*   HGB  11.0*   HEMATOCRIT  36.7*   MCV  100.8*   MCH  30.2   MCHC  30.0*   RDW  15   MPV  11.3     Recent BMP   Recent Labs      09/24/15   0243   GLUCOSE  111*   BUN  11.0   CREATININE   0.5*   CALCIUM  8.8   SODIUM  141   POTASSIUM  4.3   CHLORIDE  98*   CO2  35*       Rads:   Radiological Procedure reviewed.    Signed by: Sherrie Mustache, MD

## 2015-09-24 NOTE — Progress Notes (Addendum)
09/24/15 1458   Discharge Disposition   Patient preference/choice provided? Yes   Physical Discharge Disposition LTAC   Receiving facility, unit and room number: 223   Nursing report phone number: [phone]   Mode of Transportation Ambulance  [facility] )   Pick up time 3:30 p.m   Patient/Family/POA notified of transfer plan Yes   Patient agreeable to discharge plan/expected d/c date? Yes   Family/POA agreeable to discharge plan/expected d/c date? Yes   Bedside nurse notified of transport plan? Yes   Special requirements for patient during transport: Oxygen   Hard copy of narcotic RX sent with patient? Yes   Hard copy of DNR/Advance Directive sent with patient? N/A   IV antibiotics post discharge? N/A   Wound care post discharge? N/A   CM Interventions   Follow up appointment scheduled? No   Reason no follow up scheduled? Family to schedule   Multidisciplinary rounds/family meeting before d/c? Yes   Medicare Checklist   Is this a Medicare patient? Yes       Patient is going to [facility] in [place] , [place] , room : 223     Patient's son chose [facility]        Everardo Pacific, MSW  Care Coordinator  [facility]  [phone]  [email or web address]

## 2015-09-24 NOTE — Discharge Summary (Addendum)
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 Atrial fibrillation    Problem Lists:  Patient Active Problem List   Diagnosis   . Quadriplegia   . Atrial fibrillation   . Shock   . Cord compression   . Acute respiratory failure   . Aspiration pneumonia   . Acute deep vein thrombosis (DVT) of femoral vein of right lower extremity   . Atrial fibrillation, unspecified type   . Chronic atrial fibrillation   . Aspiration pneumonia of both lower lobes, unspecified aspiration pneumonia type   . Encephalopathy   . Mucus plugging of bronchi   . Shortness of breath   . Oropharyngeal dysphagia   . Generalized weakness   . Pain   . 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]  AVF (arteriovenous fistula) [I77.0]  Quadriplegia [G82.50]    Consultations:   Treatment Team: Attending Provider: Les Pou, MD; Consulting Physician: Madaline Savage, MD; Surgeon: Amedeo Kinsman, MD; Surgeon: Madaline Savage, MD; Social Worker: Lemont Fillers, [place]; Consulting Physician: Wayland Salinas, MD; Licensed Clinical Social Worker: Adolm Joseph; Consulting Physician: Sherrie Mustache, MD; Case Manager: Jonnie Finner, MSW; Case Manager: Vianne Bulls, RN; Consulting Physician: Les Pou, MD; Occupational Therapist: Blair Hailey, OT; Respiratory Care Practitioner: Charlesetta Ivory, RT; Registered Nurse: Alfredo Bach, RN; Registered Nurse: Zane Herald, Ailyn, RN; Technician: Daphene Calamity.; Speech Language Pathologist: Leslee Home, SLP    Procedures performed:     Echocardiogram Adult Complete W Clr/ Dopp Waveform   Final Result      CT Cervical Spine WO Contrast   Final Result      1. Extensive anterior and posterior cervical spine surgery. Please see   the above discussion.      Otho Ket, MD    09/22/2015 6:08 PM         XR Chest AP Portable   Final Result    No significant changes. Moderate to large bilateral pleural   effusions and associated atelectasis.      Wynema Birch, MD    09/22/2015 7:32 AM         XR Chest AP Portable   Final Result       1. Small bilateral pleural effusions, left side larger than right.   2. Lower lung atelectasis.      Collene Schlichter, MD    09/18/2015 12:29 PM         XR Chest AP Portable   Final Result    Radiographic examination of the chest shows improving right   basilar opacity.  There is no gross change in left basilar opacity. The   left basilar opacity is likely due to a combination of factors including   lung consolidation and pleural effusion.  Miguel Dibble, MD    09/16/2015 6:58 AM         CT Angiogram Chest   Final Result          1. No CT evidence of pulmonary embolism.   2. Large bilateral pleural effusions with adjacent infiltrates worrisome   for pneumonia      J. Carole Binning, MD    09/13/2015 4:29 PM         US Venous Duplex Doppler Leg Bilateral   Final Result      1. Thrombosis right profunda femoral vein.   2. Thrombosis left greater saphenous vein.      Findings discussed with and acknowledged by Dr. Lemar Livings at 12:25 pm.      Clide Cliff, MD    09/13/2015 12:27 PM         XR Chest AP Portable   Final Result         Moderate-sized bilateral pleural effusions with adjacent atelectasis   stable from prior study      Kristine Linea, MD    09/13/2015 8:35 AM         XR Chest AP Portable   Final Result      1. Persistent bilateral pleural effusions and basilar atelectasis.   Overall taking into account the  decrease inspiratory phase is no   significant change compared to 09/08/2015. Clinical correlation required   to exclude superimpose pneumonia.      Olen Pel, MD    09/11/2015 4:06 AM         XR Chest AP Portable   Final Result    New tracheostomy. Unchanged moderate pleural effusions and   probable bibasilar atelectasis.      Elizebeth Koller, MD    09/08/2015 1:07 PM         XR Chest AP Portable   Final Result    Low lung volumes with mild bibasilar atelectasis. Small   effusions. Endotracheal tube 6 images above the carina      Jerald Kief, MD    09/07/2015 2:49 PM         XR Chest AP Portable   Final Result      1. Near complete opacification of the left lung which may be secondary   to mucous plugging. Urgent findings discussed with, and read back by,   nurse Misty Stanley, RN at [phone] on 09/07/2015.   2. Stable right-sided pleural effusion and atelectasis.      Mickel Duhamel, MD    09/07/2015 9:25 AM         XR Cervical Spine 2 Or 3 Views   Final Result   . Postoperative changes as above.      Marty Heck, MD    09/06/2015 6:06 PM         XR Chest AP Portable   Final Result      Stable exam. Cardiomegaly with moderate bilateral pleural effusions and   bibasilar atelectasis and/or consolidation.      Latanya Maudlin, MD    09/05/2015 8:59 AM         XR Chest AP Portable   Final Result    Cardiomegaly, bilateral atelectasis and effusions.      Geanie Cooley, MD    09/04/2015 7:35 PM         G,J,G/J Tube Placement   Final Result      CT Cervical Spine WO Contrast  Final Result   Addendum 2 of 2   ADDENDUM:  CT examination of the cervical spine is reviewed and compared   to intraoperative fluoroscopic images obtained on 08/20/2015. Please   note that the surgical hardware as is described in this report is not in   the same position as on the intraoperative fluoroscopic images.      Neldon Mc, MD    09/03/2015 10:21 PM         Addendum 1 of 2   ADDENDUM:  On rereview of imaging, there are bilateral jumped facets.         Neldon Mc, MD    08/28/2015 10:41 PM         Final         1.  Markedly abnormal examination with anterolisthesis of C6 upon C7   measuring 16 mm with a jumped facet on the left and a comminuted   displaced fracture involving the right C7 articular facet.      2.  Anterior cervical spinal fusion hardware is malpositioned with both   plate and screws extending into the C6-C7 disc space.       3.  The AP dimension of the bony spinal canal at the superior C5 level   is reduced to 5 mm.       These critical results were discussed with Payton Spark, PA at 9:36 PM   on 08/28/2015.      Neldon Mc, MD    08/28/2015 9:36 PM         US Abdomen Limited Ruq   Final Result      1. Similar findings as seen on recent CT. Thick-walled bladder without   significant distention with pericholecystic fluid. Lack of gallstones   and clear focal tenderness makes cholecystitis less likely. The patient   was uncooperative for this portable examination however. Differential   considerations would include hypoalbuminemia, chronic cholecystitis or   acute cholecystitis. If continued clinical concern can perform HIDA   scan.   2. Bilateral effusions.   3. Patient refused renal evaluation.      Charlott Rakes, MD    09/03/2015 10:24 AM         CT Abdomen Pelvis WO IV/ WO PO Cont   Final Result      1. Decompressed stomach located within the anterior abdomen extending   below the left hepatic lobe margin and above the transverse colon.   2. Nonspecific gallbladder wall thickening with pericholecystic fluid   and stranding in the presence of small ascites.  Recommend clinical   correlation for acute cholecystitis.   3. Bilateral small effusions and basilar lower lobe consolidative   atelectasis.   4. Cardiac enlargement.      These urgent findings and/or recommendations were called to   Dr. Durene Cal    at  9:55 PM on 09/02/2015.      Charlott Rakes, MD    09/02/2015 9:57 PM         XR Chest AP Portable   Final Result    Newly appearing  small bilateral pleural effusions with   overlying consolidation.         Mills Koller, MD    09/02/2015 7:08 AM         XR Abdomen Portable   Final Result       1. No enteric tube is identified. Finding was discussed with Ermalene Postin, RN, at 10:45 PM  on 09/01/2015 via telephone.   2. Trace bilateral pleural effusions.      Anne Hahn, MD    09/01/2015 10:49 PM         IVC Filter Placement   Final Result    Successful deployment of a retrievable inferior vena cava   filter. Contrast material: 15 cc Omnipaque. Fluoroscopy time 0.7   minutes. Air kerma 123 mGy.      Robbie Lis, MD    08/31/2015 6:22 PM         US Venous Duplex Doppler Leg Bilateral   Final Result      Nonocclusive deep venous thrombosis involving a short segment of the   proximal femoral vein.      These findings were discussed as an urgent report with the patient's   nurse Yelena at p.m. on 08/30/2015.      Colonel Bald, MD    08/30/2015 2:59 PM         XR Chest AP Portable   Final Result   . Line tip SVC with no pneumothorax      Marty Heck, MD    08/30/2015 3:12 AM         Fluoroscopy less than 1 hour   Final Result    Fluoroscopy provided.       Prince Solian, MD    08/29/2015 8:10 AM         XR Abdomen Portable   Final Result    Tip of the enteric tube in the body of the stomach.      Colonel Bald, MD    08/28/2015 5:38 PM         XR Abdomen Portable   Final Result    Nasogastric tube tip at or just below the gastroesophageal   junction.      Remer Macho, MD    08/28/2015 2:45 PM         VIDEO SWALLOW STUDY SCAN   Final Result      XR Humerus Right AP Lateral   Final Result      1. No fracture acute osseous abnormality identified in the right   shoulder, humerus or forearm.      Olen Pel, MD    08/28/2015 3:46 AM         XR Forearm Complete Right   Final Result      1. No fracture acute osseous abnormality identified in the right   shoulder, humerus or forearm.      Olen Pel, MD    08/28/2015 3:46 AM         XR  Shoulder Right 2+ Views   Final Result      1. No fracture acute osseous abnormality identified in the right   shoulder, humerus or forearm.      Olen Pel, MD    08/28/2015 3:46 AM         XR Chest AP Portable   Final Result    Minimal left basilar linear atelectasis or scarring.      Pennelope Bracken, MD    08/26/2015 7:03 AM         Fluoroscopy video swallow with speech   Final Result      Fluoroscopic guidance was provided for oropharyngeal swallowing study.      Marty Heck, MD    08/25/2015 11:14 AM         XR Chest AP Portable  Final Result       1. No infiltrate.   2. Mild lingular scarring versus atelectasis.      Collene Schlichter, MD    08/24/2015 11:00 AM         Fluoroscopy greater than 1 hour   Final Result     Fluoroscopic guidance        Gerlene Burdock, MD    08/20/2015 8:15 PM         CT Imported Outside Images   Preliminary Result      MRI Cervical Spine W WO Contrast   Final Result    Postsurgical change with prior laminectomy. Severe stenosis   C6-C7 with cord indentation and suspected myelopathic change. There   findings suspicious for a disc ligamentous injury at this level.   Correlation with CT is advised.      Findings discussed with and acknowledged by Dr. Sheran Luz 10:44  p.m.   08/19/2015      Melody Haver, MD    08/19/2015 10:47 PM         XR Chest AP Portable   Final Result      No acute process         Blair Promise, MD    08/18/2015 9:09 AM         MRI IMPORTED OUTSIDE IMAGES   Preliminary Result      OTHER RADIOLOGY SCANS - 130560 SS   Final Result      OTHER RADIOLOGY SCANS - 130560 SS   Final Result      OTHER RADIOLOGY SCANS - 130560 SS   Final Result      OTHER RADIOLOGY SCANS - 130560 SS   Final Result      OTHER RADIOLOGY SCANS - 130560 SS   Final Result      OTHER RADIOLOGY SCANS - 130560 SS   Final Result      OTHER RADIOLOGY SCANS - 130560 SS   Final Result      OTHER RADIOLOGY SCANS - 130560 SS   Final Result      OTHER RADIOLOGY SCANS - 130560 SS   Final Result      OTHER  RADIOLOGY SCANS - 130560 SS   Final Result      OTHER RADIOLOGY SCANS - 130560 SS   Final Result          Presenting history and hospital Course:   HPI per Dr. Cyndie Chime  "Ernest Diaz is a 70 y.o. male hx afib on Eliquis + aspirin, HTN who presented to [facility] 2/25 after a fall. CT head and spine were negative so he was discharged. 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 MRI cervical and thoracic spine showed spinal epidural hematoma extending from the foramen magnum to the upper thoracic spine. Neurosurgery (Dr. Jaynie Collins) was consulted. His Eliquis was reversed. He ultimately had C3-C5 and T3-T4 decompressive laminectomies 2/27 by Dr. Jaynie Collins. He noted extensive dorsal epidural venous plexus and when he dissected out these vessels they bled. Dr. Jaynie Collins requested he be transferred here for spinal angiogram. These symptoms are sudden onset, severe intensity, without alleviating factors. "    HOSPITAL COURSE    Neuro: Patient with prolonged hospital course. He initially underwent  C3-C5 and T3-T4 decompressive laminectomies for EDH evacuation on 08/10/15 by Dr. Jaynie Collins at [facility].  He subsequently underwent C6-7 ACDF, C6-C7 posterior fusion with lateral mass scews on 08/20/15 by Dr. Jaynie Collins.  Patient hospital course was c/b fracture dislocation C6-C7 with malpositioned hardware.  He was taken for urgent C5-T1 posterior fusion, C5-C7 anterior fusion with partial C7 corpectomy on 08/29/15 by Dr. Claudette Laws.  Patient okay to take off aspen collar in bed at rest,  but otherwise must have collar on.    Pain controlled on Fentanyl patch, Gabapentin, and prn Morphine.  Palliative Care following for goals of care, code status is DNR/support ok.   Extensive discussion held with patient and family regarding prognosis, patient and family aware LE weakness may not significantly improve.  Patient to follow up with DR. Lim from neurosurgery at discharge.       Hypoxic/hypercarbic respiratory failure s/p trach 3/28: Bronched  3/27 for left lung collapse from mucus plugging in the setting of stenotrophomonas growth in sputum culture.  Patietn tolerating TC for several days now, however still has occasional desaturations to 80s requiring aggressive suctioning.  Patient was followed closely by Pulmonary.   Received bronchodilators, Scopolamine patch and Robinul for secretions.  Patient had a CTA negative for PE on 09/13/15, but showed b/l pleural effusions.    Per Pulmonology no indication for thoracentesis at this time, continue diuresis for pleural effusions.  CXR shows b/l pleural effusions and atelectasis from 4/11.  Patient continued on diuresis with Lasix 20mg  PO, and tolerated diuresis with Midodrine for BP support.  SLP consulted for PMSV, requires aggressive suctioning however able to communicate with PMSV.  Per pulm patient stable for discharge to LTAC and stable for transfer.      ID: Patient now afebrile with resolved Leukocytosis.  ID following; currently off antibiotics.     Hypotension - chronic, in the setting of dysautonomia.  Patient with episode of bradycardia, desaturation, and hypotension when sitting up with physical therapy. Per PT occurs with minimal exertion and no evidence of vagal. Vitals return to normal after suctioning and when patient returned to bed. Etiology concerning for mucus plugging. TTE EF 65%, no evidence of pericardial effusion.  Patient started on midodrine for hypotension in setting of dysautonomia.    Not requiring Florinef at this time. Should attempt to wean off Midodrine when not requiring diuresis from respiratory standpoint.        Atrial Fibrillation: Rate controlled without medications.  Discussed with Neurosurgery Dr. Jaynie Collins, cleared for and started on Eliquis from 09/13/15    DVT in right profunda femoral vein s/p IVC filter placement.   Negative for PE as per CT angio on 4/2. Was previously off Eliquis due to recent spinal cord surgery. Resumed Eliquis on 09/13/15, d/w family and  Neurosurgery before starting Eliquis. Possible risks/complications given recent spinal surgery discussed with family who are in agreement with starting AC at this time     Insomnia.  Difficulty sleeping at night and some agitation.  Now on Seroquel 25 mg q hs. EKG this admission shows QTc in 420s      Physical exam at discharge:  Filed Vitals:    09/24/15 0846   BP:    Pulse: 61   Temp:    Resp: 19   SpO2: 100%     General: awake, alert X 3, not in distress  HEENT- NC/AT  Neck- trach in place- site: dry and clean   Cardiovascular: irregularly irregular, bradycardic murmurs, rubs or gallops  Lungs: coarse breath sounds and dull bilaterally, without wheezing or rales  Abdomen: soft, non-tender, non-distended; no palpable masses, normoactive bowel sounds. PEG tube site- clean/dry/no leakage  Neuro: Cranial nerves- grossly intact, 4/5 in both upper extremities (diminshed grip strength more so on left),  0/5 in both lower extremities, follows commands, sensation intact in upper and diminished in b/l extremities     Discharge Medications:        Medication List      START taking these medications          acetaminophen 325 MG tablet   Commonly known as:  TYLENOL   Take 1 tablet (325 mg total) by mouth every 4 (four) hours as needed.       albuterol-ipratropium 2.5-0.5(3) mg/3 mL nebulizer   Commonly known as:  DUO-NEB   Take 3 mLs by nebulization every 6 (six) hours.       balsam peru-castor oil (VENELEX) ointment   Apply topically every 12 (twelve) hours.       bisacodyl 10 mg suppository   Commonly known as:  DULCOLAX   Place 1 suppository (10 mg total) rectally daily as needed for Constipation.       famotidine 20 MG tablet   Commonly known as:  PEPCID   Take 1 tablet (20 mg total) by mouth every 12 (twelve) hours.       fentaNYL 50 MCG/HR   Commonly known as:  DURAGESIC   Place 1 patch onto the skin every third day.       furosemide 20 MG tablet   Commonly known as:  LASIX   Take 1 tablet (20 mg total) by mouth 2  (two) times daily.       gabapentin 100 MG capsule   Commonly known as:  NEURONTIN   Take 2 capsules (200 mg total) by mouth nightly.       glycopyrrolate 1 MG tablet   Commonly known as:  ROBINUL   Take 1 tablet (1 mg total) by mouth every 8 (eight) hours.       lactobacillus/streptococcus Caps   Take 2 capsules by mouth daily.       methocarbamol 750 MG tablet   Commonly known as:  ROBAXIN   Take 1 tablet (750 mg total) by mouth every 8 (eight) hours.       midodrine 10 MG tablet   Commonly known as:  PROAMATINE   Take 2 tablets (20 mg total) by mouth 3 (three) times daily with meals.       morphine 20 MG/ML concentrated solution   0.75 mLs (15 mg total) by per G tube route every 4 (four) hours as needed.       phenol 1.4 % Liqd   Commonly known as:  CHLORASEPTIC   Take 1 spray by mouth as needed.       QUEtiapine 25 MG tablet   Commonly known as:  SEROquel   Take 1 tablet (25 mg total) by mouth nightly.       scopolamine 1.5 mg   Commonly known as:  TRANSDERM-SCOP   Place 1 patch onto the skin every third day.       senna-docusate 8.6-50 MG per tablet   Commonly known as:  PERICOLACE   Take 1 tablet by mouth 2 (two) times daily.       terazosin 1 MG capsule   Commonly known as:  HYTRIN   Take 1 capsule (1 mg total) by mouth nightly.         CHANGE how you take these medications          apixaban 5 MG   Commonly known as:  ELIQUIS   1 tablet (5 mg total) by per NG tube route every 12 (twelve) hours.  What changed:  how to take this         CONTINUE taking these medications          CENTRUM SILVER ADULT 50+ PO       Fish Oil 1200 MG Caps       folic acid 400 MCG tablet   Commonly known as:  FOLVITE       vitamin D3 1000 units Caps         STOP taking these medications          amitriptyline 10 MG tablet   Commonly known as:  ELAVIL       amLODIPine 10 MG tablet   Commonly known as:  NORVASC       aspirin EC 81 MG EC tablet       atenolol 50 MG tablet   Commonly known as:  TENORMIN       CALCIUM 600 + D PO        FLAX SEEDS PO       Magnesium 300 MG Caps       triamterene-hydrochlorothiazide 37.5-25 MG per tablet   Commonly known as:  MAXZIDE-25            Where to Get Your Medications      You can get these medications from any pharmacy     Bring a paper prescription for each of these medications    - fentaNYL 50 MCG/HR  - methocarbamol 750 MG tablet  - morphine 20 MG/ML concentrated solution      Information about where to get these medications is not yet available     ! Ask your nurse or doctor about these medications    - acetaminophen 325 MG tablet  - albuterol-ipratropium 2.5-0.5(3) mg/3 mL nebulizer  - apixaban 5 MG  - balsam peru-castor oil (VENELEX) ointment  - bisacodyl 10 mg suppository  - famotidine 20 MG tablet  - furosemide 20 MG tablet  - gabapentin 100 MG capsule  - glycopyrrolate 1 MG tablet  - lactobacillus/streptococcus Caps  - midodrine 10 MG tablet  - phenol 1.4 % Liqd  - QUEtiapine 25 MG tablet  - scopolamine 1.5 mg  - senna-docusate 8.6-50 MG per tablet  - terazosin 1 MG capsule           Discharge Instructions:   [place]  [address]  [place] [address]  [phone]    In 3 days      Madaline Savage, MD  [address]  [address]  [place] [address]  [phone]    Schedule an appointment as soon as possible for a visit in 1 week        TIME SPENT:   On discharge and care coordination is 45 minutes. I have spent a large amount of time explaining everything to the patient and family at bedside, going over all the details of discharge diagnoses and the follow up plan.     Signed by: Les Pou, MD    CC to: Berton Lan, MD

## 2015-09-24 NOTE — Progress Notes (Signed)
Pt AxOx2. FC. MAE. Pt denies pain. No PRN given. VSS see doc flowsheet for trends. HR 39-60s. Afib. TM 40% 8L, no desats. Oral care done Q4. Suction Q4. No BM at this time. Foley remains in place with AUOP. Pt resting at this time. Will continue to monitor the patient. Safety maintained.

## 2016-01-16 ENCOUNTER — Emergency Department (HOSPITAL_COMMUNITY): Payer: Medicare Other

## 2016-01-16 ENCOUNTER — Inpatient Hospital Stay (HOSPITAL_COMMUNITY)
Admission: EM | Admit: 2016-01-16 | Discharge: 2016-01-22 | DRG: 853 | Disposition: A | Discharge status: Other Acute Inpatient Hospital | Payer: Medicare Other | Attending: Ophthalmology | Admitting: Ophthalmology

## 2016-01-16 DIAGNOSIS — E870 Hyperosmolality and hypernatremia: Secondary | ICD-10-CM | POA: Diagnosis not present

## 2016-01-16 DIAGNOSIS — G92 Toxic encephalopathy: Secondary | ICD-10-CM | POA: Diagnosis present

## 2016-01-16 DIAGNOSIS — R402433 Glasgow coma scale score 3-8, at hospital admission: Secondary | ICD-10-CM

## 2016-01-16 DIAGNOSIS — A411 Sepsis due to other specified staphylococcus: Principal | ICD-10-CM | POA: Diagnosis present

## 2016-01-16 DIAGNOSIS — Z452 Encounter for adjustment and management of vascular access device: Secondary | ICD-10-CM

## 2016-01-16 DIAGNOSIS — R7881 Bacteremia: Secondary | ICD-10-CM

## 2016-01-16 DIAGNOSIS — J95851 Ventilator associated pneumonia: Secondary | ICD-10-CM

## 2016-01-16 DIAGNOSIS — J156 Pneumonia due to other aerobic Gram-negative bacteria: Secondary | ICD-10-CM | POA: Diagnosis not present

## 2016-01-16 DIAGNOSIS — N179 Acute kidney failure, unspecified: Secondary | ICD-10-CM | POA: Diagnosis not present

## 2016-01-16 DIAGNOSIS — J9620 Acute and chronic respiratory failure, unspecified whether with hypoxia or hypercapnia: Secondary | ICD-10-CM | POA: Diagnosis not present

## 2016-01-16 DIAGNOSIS — Z930 Tracheostomy status: Secondary | ICD-10-CM

## 2016-01-16 DIAGNOSIS — R402432 Glasgow coma scale score 3-8, at arrival to emergency department: Secondary | ICD-10-CM | POA: Diagnosis not present

## 2016-01-16 DIAGNOSIS — Z86718 Personal history of other venous thrombosis and embolism: Secondary | ICD-10-CM

## 2016-01-16 DIAGNOSIS — T82868A Thrombosis of vascular prosthetic devices, implants and grafts, initial encounter: Secondary | ICD-10-CM | POA: Diagnosis not present

## 2016-01-16 DIAGNOSIS — J9622 Acute and chronic respiratory failure with hypercapnia: Secondary | ICD-10-CM | POA: Diagnosis not present

## 2016-01-16 DIAGNOSIS — I482 Chronic atrial fibrillation: Secondary | ICD-10-CM | POA: Diagnosis present

## 2016-01-16 DIAGNOSIS — Z7401 Bed confinement status: Secondary | ICD-10-CM | POA: Diagnosis not present

## 2016-01-16 DIAGNOSIS — J151 Pneumonia due to Pseudomonas: Secondary | ICD-10-CM | POA: Diagnosis not present

## 2016-01-16 DIAGNOSIS — R509 Fever, unspecified: Secondary | ICD-10-CM | POA: Diagnosis not present

## 2016-01-16 DIAGNOSIS — Z981 Arthrodesis status: Secondary | ICD-10-CM

## 2016-01-16 DIAGNOSIS — M810 Age-related osteoporosis without current pathological fracture: Secondary | ICD-10-CM | POA: Diagnosis not present

## 2016-01-16 DIAGNOSIS — A498 Other bacterial infections of unspecified site: Secondary | ICD-10-CM

## 2016-01-16 DIAGNOSIS — J9621 Acute and chronic respiratory failure with hypoxia: Secondary | ICD-10-CM | POA: Diagnosis present

## 2016-01-16 DIAGNOSIS — G8250 Quadriplegia, unspecified: Secondary | ICD-10-CM | POA: Diagnosis not present

## 2016-01-16 DIAGNOSIS — J90 Pleural effusion, not elsewhere classified: Secondary | ICD-10-CM | POA: Diagnosis present

## 2016-01-16 DIAGNOSIS — Y848 Other medical procedures as the cause of abnormal reaction of the patient, or of later complication, without mention of misadventure at the time of the procedure: Secondary | ICD-10-CM | POA: Diagnosis not present

## 2016-01-16 DIAGNOSIS — J9601 Acute respiratory failure with hypoxia: Secondary | ICD-10-CM | POA: Diagnosis not present

## 2016-01-16 DIAGNOSIS — N390 Urinary tract infection, site not specified: Secondary | ICD-10-CM | POA: Diagnosis not present

## 2016-01-16 DIAGNOSIS — A419 Sepsis, unspecified organism: Secondary | ICD-10-CM | POA: Diagnosis present

## 2016-01-16 DIAGNOSIS — R4182 Altered mental status, unspecified: Secondary | ICD-10-CM | POA: Diagnosis present

## 2016-01-16 DIAGNOSIS — J9690 Respiratory failure, unspecified, unspecified whether with hypoxia or hypercapnia: Secondary | ICD-10-CM

## 2016-01-16 DIAGNOSIS — J80 Acute respiratory distress syndrome: Secondary | ICD-10-CM | POA: Diagnosis not present

## 2016-01-16 DIAGNOSIS — L89620 Pressure ulcer of left heel, unstageable: Secondary | ICD-10-CM | POA: Diagnosis present

## 2016-01-16 DIAGNOSIS — E872 Acidosis: Secondary | ICD-10-CM | POA: Diagnosis not present

## 2016-01-16 DIAGNOSIS — B965 Pseudomonas (aeruginosa) (mallei) (pseudomallei) as the cause of diseases classified elsewhere: Secondary | ICD-10-CM | POA: Diagnosis not present

## 2016-01-16 DIAGNOSIS — Z6829 Body mass index (BMI) 29.0-29.9, adult: Secondary | ICD-10-CM

## 2016-01-16 DIAGNOSIS — Z9911 Dependence on respirator [ventilator] status: Secondary | ICD-10-CM

## 2016-01-16 DIAGNOSIS — E46 Unspecified protein-calorie malnutrition: Secondary | ICD-10-CM | POA: Diagnosis present

## 2016-01-16 DIAGNOSIS — L8990 Pressure ulcer of unspecified site, unspecified stage: Secondary | ICD-10-CM | POA: Insufficient documentation

## 2016-01-16 DIAGNOSIS — Z2239 Carrier of other specified bacterial diseases: Secondary | ICD-10-CM

## 2016-01-16 DIAGNOSIS — D539 Nutritional anemia, unspecified: Secondary | ICD-10-CM | POA: Diagnosis present

## 2016-01-16 DIAGNOSIS — R40243 Glasgow coma scale score 3-8, unspecified time: Secondary | ICD-10-CM

## 2016-01-16 DIAGNOSIS — Z931 Gastrostomy status: Secondary | ICD-10-CM | POA: Diagnosis not present

## 2016-01-16 DIAGNOSIS — R6521 Severe sepsis with septic shock: Secondary | ICD-10-CM | POA: Diagnosis present

## 2016-01-16 DIAGNOSIS — L89153 Pressure ulcer of sacral region, stage 3: Secondary | ICD-10-CM | POA: Diagnosis not present

## 2016-01-16 DIAGNOSIS — B9689 Other specified bacterial agents as the cause of diseases classified elsewhere: Secondary | ICD-10-CM | POA: Diagnosis not present

## 2016-01-16 LAB — COMPREHENSIVE METABOLIC PANEL
ALBUMIN: 1.7 g/dL — AB (ref 3.5–5.0)
ALT: 23 U/L (ref 17–63)
AST: 31 U/L (ref 15–41)
Alkaline Phosphatase: 69 U/L (ref 38–126)
Anion gap: 8 (ref 5–15)
BUN: 64 mg/dL — AB (ref 6–20)
CHLORIDE: 111 mmol/L (ref 101–111)
CO2: 31 mmol/L (ref 22–32)
CREATININE: 0.57 mg/dL — AB (ref 0.61–1.24)
Calcium: 8.4 mg/dL — ABNORMAL LOW (ref 8.9–10.3)
GFR calc Af Amer: >60 mL/min (ref >60)
GLUCOSE: 147 mg/dL — AB (ref 65–99)
Potassium: 4.7 mmol/L (ref 3.5–5.1)
Sodium: 150 mmol/L — ABNORMAL HIGH (ref 135–145)
Total Bilirubin: 0.5 mg/dL (ref 0.3–1.2)
Total Protein: 7 g/dL (ref 6.5–8.1)

## 2016-01-16 LAB — I-STAT ARTERIAL BLOOD GAS, ED
Acid-Base Excess: 6 mmol/L — ABNORMAL HIGH (ref 0.0–2.0)
BICARBONATE: 35.7 meq/L — AB (ref 20.0–24.0)
O2 Saturation: 85 %
PCO2 ART: 86.5 mmHg — AB (ref 35.0–45.0)
TCO2: 38 mmol/L (ref 0–100)
pH, Arterial: 7.213 — ABNORMAL LOW (ref 7.350–7.450)
pO2, Arterial: 58 mmHg — ABNORMAL LOW (ref 80.0–100.0)

## 2016-01-16 LAB — CBC WITH DIFFERENTIAL/PLATELET
BASOS PCT: 0 %
Basophils Absolute: 0 10*3/uL (ref 0.0–0.1)
EOS ABS: 0 10*3/uL (ref 0.0–0.7)
EOS PCT: 0 %
HEMATOCRIT: 33.9 % — AB (ref 39.0–52.0)
HEMOGLOBIN: 9.4 g/dL — AB (ref 13.0–17.0)
LYMPHS PCT: 3 %
Lymphs Abs: 0.7 10*3/uL (ref 0.7–4.0)
MCH: 28.3 pg (ref 26.0–34.0)
MCHC: 27.7 g/dL — ABNORMAL LOW (ref 30.0–36.0)
MCV: 102.1 fL — AB (ref 78.0–100.0)
MONOS PCT: 2 %
Monocytes Absolute: 0.5 10*3/uL (ref 0.1–1.0)
NEUTROS ABS: 22.2 10*3/uL — AB (ref 1.7–7.7)
Neutrophils Relative %: 95 %
PLATELETS: 280 10*3/uL (ref 150–400)
RBC: 3.32 MIL/uL — ABNORMAL LOW (ref 4.22–5.81)
RDW: 18.1 % — ABNORMAL HIGH (ref 11.5–15.5)
WBC MORPHOLOGY: INCREASED
WBC: 23.4 10*3/uL — AB (ref 4.0–10.5)

## 2016-01-16 LAB — I-STAT CG4 LACTIC ACID, ED: LACTIC ACID, VENOUS: 1.55 mmol/L (ref 0.5–1.9)

## 2016-01-16 MED ORDER — ENOXAPARIN SODIUM 40 MG/0.4ML ~~LOC~~ SOLN
40.0000 mg | SUBCUTANEOUS | Status: DC
  Administered 2016-01-17 – 2016-01-22 (×6): 40 mg via SUBCUTANEOUS
  Filled 2016-01-16 (×6): qty 0.4

## 2016-01-16 MED ORDER — PIPERACILLIN-TAZOBACTAM 3.375 G IVPB
3.3750 g | Freq: Three times a day (TID) | INTRAVENOUS | Status: DC
  Administered 2016-01-17 – 2016-01-19 (×8): 3.375 g via INTRAVENOUS
  Filled 2016-01-16 (×9): qty 50

## 2016-01-16 MED ORDER — FENTANYL CITRATE (PF) 100 MCG/2ML IJ SOLN
50.0000 ug | INTRAMUSCULAR | Status: DC | PRN
  Administered 2016-01-17 – 2016-01-18 (×2): 50 ug via INTRAVENOUS
  Filled 2016-01-16 (×2): qty 2

## 2016-01-16 MED ORDER — VANCOMYCIN HCL IN DEXTROSE 1-5 GM/200ML-% IV SOLN
1000.0000 mg | Freq: Once | INTRAVENOUS | Status: DC
  Filled 2016-01-16: qty 200

## 2016-01-16 MED ORDER — SODIUM CHLORIDE 0.9 % IV BOLUS (SEPSIS)
500.0000 mL | Freq: Once | INTRAVENOUS | Status: AC
  Administered 2016-01-16: 500 mL via INTRAVENOUS

## 2016-01-16 MED ORDER — VANCOMYCIN HCL 10 G IV SOLR
2000.0000 mg | Freq: Once | INTRAVENOUS | Status: AC
  Administered 2016-01-16: 2000 mg via INTRAVENOUS
  Filled 2016-01-16: qty 2000

## 2016-01-16 MED ORDER — FAMOTIDINE 40 MG/5ML PO SUSR
20.0000 mg | Freq: Two times a day (BID) | ORAL | Status: DC
  Administered 2016-01-17 – 2016-01-22 (×12): 20 mg
  Filled 2016-01-16 (×13): qty 2.5

## 2016-01-16 MED ORDER — SODIUM CHLORIDE 0.9 % IV BOLUS (SEPSIS)
1000.0000 mL | Freq: Once | INTRAVENOUS | Status: AC
  Administered 2016-01-16: 1000 mL via INTRAVENOUS

## 2016-01-16 MED ORDER — VANCOMYCIN HCL IN DEXTROSE 1-5 GM/200ML-% IV SOLN
1000.0000 mg | Freq: Three times a day (TID) | INTRAVENOUS | Status: DC
  Administered 2016-01-17 – 2016-01-18 (×4): 1000 mg via INTRAVENOUS
  Filled 2016-01-16 (×6): qty 200

## 2016-01-16 MED ORDER — PIPERACILLIN-TAZOBACTAM 3.375 G IVPB 30 MIN: 3.3750 g | Freq: Once | INTRAVENOUS | Status: DC

## 2016-01-16 MED ORDER — PIPERACILLIN-TAZOBACTAM 3.375 G IVPB 30 MIN
3.3750 g | Freq: Once | INTRAVENOUS | Status: AC
  Administered 2016-01-16: 3.375 g via INTRAVENOUS
  Filled 2016-01-16: qty 50

## 2016-01-16 MED ORDER — SODIUM CHLORIDE 0.9 % IV SOLN
250.0000 mL | INTRAVENOUS | Status: DC | PRN
  Administered 2016-01-20 – 2016-01-22 (×2): 250 mL via INTRAVENOUS

## 2016-01-16 MED ORDER — DEXTROSE 5 % IV SOLN
5.0000 ug/min | INTRAVENOUS | Status: DC
  Administered 2016-01-16: 5 ug/min via INTRAVENOUS

## 2016-01-16 MED ORDER — FENTANYL CITRATE (PF) 100 MCG/2ML IJ SOLN
50.0000 ug | INTRAMUSCULAR | Status: DC | PRN
  Administered 2016-01-20: 50 ug via INTRAVENOUS
  Filled 2016-01-16: qty 2

## 2016-01-16 MED ORDER — FREE WATER
250.0000 mL | Status: DC
  Administered 2016-01-17 – 2016-01-22 (×35): 250 mL

## 2016-01-16 NOTE — ED Notes (Signed)
MD aware of patient's BP. MD states patient's BP normally runs on the low side, and is okay with it as long as the MAP remains at 65 or above. MAP currently 69. Now new orders at this time.

## 2016-01-16 NOTE — ED Provider Notes (Signed)
[facility]-EMERGENCY DEPT Provider Note   CSN: [id number] Arrival date & time: 01/16/16  1740  First Provider Contact:  First MD Initiated Contact with Patient 01/16/16 1801     History   Chief Complaint Chief Complaint  Patient presents with  . Altered Mental Status    HPI Andrew Reynolds is a 70 y.o. male.  The history is provided by the nursing home and a relative. The history is limited by the condition of the patient. No language interpreter was used.  Altered Mental Status   This is a new problem. The problem has been gradually worsening. Associated symptoms include confusion, somnolence and unresponsiveness. Risk factors include a recent infection.   Level V caveat due to patient's condition of altered mental status, unresponsive  No past medical history on file.  Patient Active Problem List   Diagnosis Date Noted  . Septic shock (HCC) 01/16/2016    No past surgical history on file.   Home Medications    Prior to Admission medications   Medication Sig Start Date End Date Taking? Authorizing Provider  acetaminophen (TYLENOL) 325 MG tablet Place 325 mg into feeding tube every 8 (eight) hours as needed (for pain).    Yes Historical Provider, MD  acetaZOLAMIDE (DIAMOX) 250 MG tablet Place 250 mg into feeding tube every 12 (twelve) hours. FOR FLUID RETENTION   Yes Historical Provider, MD  chlorhexidine (PERIDEX) 0.12 % solution Use as directed 15 mLs in the mouth or throat 2 (two) times daily. FOR TRACH   Yes Historical Provider, MD  fentaNYL (DURAGESIC - DOSED MCG/HR) 50 MCG/HR Place 50 mcg onto the skin every 3 (three) days. FOR PAIN   Yes Historical Provider, MD  ferrous sulfate 300 (60 Fe) MG/5ML syrup Place 300 mg into feeding tube 2 (two) times daily.   Yes Historical Provider, MD  gabapentin (NEURONTIN) 300 MG capsule Place 300 mg into feeding tube 3 (three) times daily. FOR PAIN   Yes Historical Provider, MD  ibuprofen (ADVIL,MOTRIN) 400 MG tablet Place 400 mg into  feeding tube every 8 (eight) hours as needed (for pain).   Yes Historical Provider, MD  ipratropium-albuterol (DUONEB) 0.5-2.5 (3) MG/3ML SOLN Take 3 mLs by nebulization every 6 (six) hours. RELATED TO ACUTE AND CHRONIC RESPIRATORY FAILURE, UNSPECIFIED WHETHER WITH HYPOXIA OR HYPERCAPNIA   Yes Historical Provider, MD  metoprolol tartrate (LOPRESSOR) 25 MG tablet Place 12.5 mg into feeding tube every 6 (six) hours. FOR A-FIB/HOLD FOR PULSE LESS THAN 55 OR SBP <90   Yes Historical Provider, MD  nitrofurantoin (FURADANTIN) 25 MG/5ML suspension Place 100 mg into feeding tube every Monday, Wednesday, and Friday.   Yes Historical Provider, MD  nitroGLYCERIN (NITROSTAT) 0.4 MG SL tablet Place 0.4 mg under the tongue every 5 (five) minutes as needed for chest pain.   Yes Historical Provider, MD  ondansetron (ZOFRAN) 4 MG/2ML SOLN injection Inject 4 mg into the vein every 6 (six) hours as needed for nausea or vomiting.   Yes Historical Provider, MD  Ondansetron 4 MG FILM Place 4 mg into feeding tube every 6 (six) hours as needed (for nausea or vomiting).   Yes Historical Provider, MD  pantoprazole sodium (PROTONIX) 40 mg/20 mL PACK Place 40 mg into feeding tube every morning.   Yes Historical Provider, MD  potassium chloride 20 MEQ/15ML (10%) SOLN Place 20 mEq into feeding tube every morning.   Yes Historical Provider, MD  PRESCRIPTION MEDICATION Cefepime HCl Solution 1 gm/50 ml: One gram via IV every 12 hours for  pneumonia for eight days   Yes Historical Provider, MD  Probiotic Product (PROBIOTIC PO) Place 1 tablet into feeding tube 2 (two) times daily.   Yes Historical Provider, MD  Protein POWD Place 1 scoop into feeding tube every 6 (six) hours. FOR WOUND HEALING   Yes Historical Provider, MD  Sennosides (SENNA) 8.8 MG/5ML SYRP Place 8.6 mg into feeding tube 2 (two) times daily. FOR CONSTIPATION   Yes Historical Provider, MD  terazosin (HYTRIN) 1 MG capsule Place 1 mg into feeding tube at bedtime. HOLD IF  SBP IS <120   Yes Historical Provider, MD  traZODone (DESYREL) 50 MG tablet Place 50 mg into feeding tube at bedtime.   Yes Historical Provider, MD  VANCOMYCIN HCL IV Inject 1,000 mg into the vein every 12 (twelve) hours. FOR EIGHT DAYS FOR PNEUMONIA   Yes Historical Provider, MD    Family History No family history on file.  Social History Social History  Substance Use Topics  . Smoking status: Not on file  . Smokeless tobacco: Not on file  . Alcohol use Not on file     Allergies   Sulfa antibiotics   Review of Systems Review of Systems  Unable to perform ROS: Mental status change  Psychiatric/Behavioral: Positive for confusion.     Physical Exam Updated Vital Signs BP 101/66   Pulse 102   Temp (!) 95.4 F (35.2 C) (Rectal)   Resp (!) 28   Ht 6' (1.829 m)   Wt 97.5 kg   SpO2 100%   BMI 29.16 kg/m   Physical Exam  Constitutional: He appears distressed.  HENT:  Head: Normocephalic and atraumatic.  Eyes: Pupils are equal, round, and reactive to light.  Neck:  Unable to assess, unresponsive  Cardiovascular: An irregularly irregular rhythm present. Tachycardia present.   Pulmonary/Chest: No respiratory distress.  Abdominal: Soft. He exhibits no distension. There is no tenderness.  Genitourinary:  Genitourinary Comments: Foley in place, cloudy urine  Musculoskeletal: He exhibits edema. He exhibits no deformity.  Neurological:  GCS 3, chronic trach  Skin: Skin is warm. Capillary refill takes 2 to 3 seconds.     ED Treatments / Results  Labs (all labs ordered are listed, but only abnormal results are displayed) Labs Reviewed  COMPREHENSIVE METABOLIC PANEL - Abnormal; Notable for the following:       Result Value   Sodium 150 (*)    Glucose, Bld 147 (*)    BUN 64 (*)    Creatinine, Ser 0.57 (*)    Calcium 8.4 (*)    Albumin 1.7 (*)    All other components within normal limits  CBC WITH DIFFERENTIAL/PLATELET - Abnormal; Notable for the following:    WBC  23.4 (*)    RBC 3.32 (*)    Hemoglobin 9.4 (*)    HCT 33.9 (*)    MCV 102.1 (*)    MCHC 27.7 (*)    RDW 18.1 (*)    Neutro Abs 22.2 (*)    All other components within normal limits  I-STAT ARTERIAL BLOOD GAS, ED - Abnormal; Notable for the following:    pH, Arterial 7.213 (*)    pCO2 arterial 86.5 (*)    pO2, Arterial 58.0 (*)    Bicarbonate 35.7 (*)    Acid-Base Excess 6.0 (*)    All other components within normal limits  CULTURE, BLOOD (ROUTINE X 2)  CULTURE, BLOOD (ROUTINE X 2)  URINE CULTURE  CULTURE, RESPIRATORY (NON-EXPECTORATED)  BLOOD GAS, ARTERIAL  CBC  BASIC METABOLIC PANEL  MAGNESIUM  PHOSPHORUS  URINALYSIS, ROUTINE W REFLEX MICROSCOPIC (NOT AT [facility])  BRAIN NATRIURETIC PEPTIDE  TROPONIN I  TSH  I-STAT CG4 LACTIC ACID, ED    EKG  EKG Interpretation  Date/Time:  Saturday January 16 2016 17:53:09 EDT Ventricular Rate:  113 PR Interval:    QRS Duration: 84 QT Interval:  348 QTC Calculation: 478 R Axis:   64 Text Interpretation:  Atrial fibrillation Low voltage, extremity and precordial leads Nonspecific T abnormalities, lateral leads Borderline prolonged QT interval No old tracing to compare Confirmed by [facility]  MD, DAVID ([id number]) on 01/16/2016 6:12:20 PM       Radiology Ct Head Wo Contrast  Result Date: 01/16/2016 CLINICAL DATA:  Altered mental status.  Sepsis. EXAM: CT HEAD WITHOUT CONTRAST TECHNIQUE: Contiguous axial images were obtained from the base of the skull through the vertex without intravenous contrast. COMPARISON:  None. FINDINGS: Brain: No intracranial hemorrhage, mass effect, or midline shift. Mild generalized at and chronic small vessel ischemia. No hydrocephalus. The basilar cisterns are patent. No evidence of territorial infarct. No intracranial fluid collection. Vascular: No hyperdense vessel or abnormal calcification. Skull:  Calvarium is intact. Sinuses/Orbits: Right globe prosthesis.Mild mucosal thickening of the ethmoid air cells. There is  fluid within right side of sphenoid sinus with mucosal thickening and fluid and bubbly debris. Maxillary sinuses are well-aerated. There is scattered opacification no of the mastoid air cells. Other: None. IMPRESSION: 1. No acute intracranial abnormality. Mild atrophy and chronic small vessel ischemia. 2. Paranasal sinus inflammation with mucosal thickening, fluid and bubbly debris within the right side of sphenoid sinus. Acuity is uncertain. Electronically Signed   By: Rubye Oaks M.D.   On: 01/16/2016 19:42   Dg Chest Port 1 View  Result Date: 01/16/2016 CLINICAL DATA:  Trauma.  Acute mental status change and sepsis. EXAM: PORTABLE CHEST 1 VIEW COMPARISON:  None FINDINGS: A tracheostomy tube is identified. No pneumothorax. A left PICC line terminates in the SVC. Mild cardiomegaly identified. The hila and mediastinum unremarkable. Diffuse bilateral pulmonary opacities, right greater than left with effusions in the bases, also right greater than left. No other acute abnormalities. Surgical hardware in the lower cervical spine IMPRESSION: Probable asymmetric edema with bilateral pleural effusions and underlying opacities. Recommend follow-up to resolution. Electronically Signed   By: Gerome Sam III M.D   On: 01/16/2016 19:00    Procedures Procedures (including critical care time)  Medications Ordered in ED Medications  piperacillin-tazobactam (ZOSYN) IVPB 3.375 g (not administered)  vancomycin (VANCOCIN) IVPB 1000 mg/200 mL premix (not administered)  0.9 %  sodium chloride infusion (not administered)  enoxaparin (LOVENOX) injection 40 mg (not administered)  famotidine (PEPCID) 40 MG/5ML suspension 20 mg (not administered)  norepinephrine (LEVOPHED) 4 mg in dextrose 5 % 250 mL (0.016 mg/mL) infusion (5 mcg/min Intravenous New Bag/Given 01/16/16 2230)  fentaNYL (SUBLIMAZE) injection 50 mcg (not administered)  fentaNYL (SUBLIMAZE) injection 50 mcg (not administered)  free water 250 mL (not  administered)  sodium chloride 0.9 % bolus 1,000 mL (0 mLs Intravenous Stopped 01/16/16 1950)    And  sodium chloride 0.9 % bolus 1,000 mL (0 mLs Intravenous Stopped 01/16/16 2028)    And  sodium chloride 0.9 % bolus 500 mL (0 mLs Intravenous Stopped 01/16/16 2028)  piperacillin-tazobactam (ZOSYN) IVPB 3.375 g (0 g Intravenous Stopped 01/16/16 1917)  vancomycin (VANCOCIN) 2,000 mg in sodium chloride 0.9 % 500 mL IVPB (0 mg Intravenous Stopped 01/16/16 2133)     Initial Impression /  Assessment and Plan / ED Course  I have reviewed the triage vital signs and the nursing notes.  Pertinent labs & imaging results that were available during my care of the patient were reviewed by me and considered in my medical decision making (see chart for details).  Clinical Course    Patient presents from long-term trach facility for evaluation treatment of altered mental status, somnolence. Patient is typically alert and oriented to self. Per report patient with markedly elevated CO2 level at his facility.  Upon arrival here patient unresponsive. Blood pressure 70 systolic, rectal temperature 65F. Code sepsis immediately initiated.  Patient given vancomycin/Zosyn, 30 mL/kg of IV fluids. Patient placed on our ventilator, repeat blood gas with improving CO2.  Physical exam remarkable for cloudy, concentrated urine, stage III pressure ulcer to his sacrum.  Following fluid administration, patient's blood pressure increased to map of 68.  White blood cell count 23.5, increased bandemia. CT head with no acute findings. Chest x-ray with bilateral pleural effusions with underlying opacities.  Patient covered for respiratory, urinary infection.  Discussed with ICU team who will admit patient. No further emergency department events. Diagnosis septic shock due to pulmonary versus urinary source.  I discussed case with my attending, Dr. Preston Fleeting.    Final Clinical Impressions(s) / ED Diagnoses   Final diagnoses:    Glasgow coma scale total score 3-8, at hospital admission [facility])  Septic shock [facility])    New Prescriptions New Prescriptions   No medications on file     Dan Humphreys, MD 01/16/16 [id number]    Dione Booze, MD 01/17/16 [phone]

## 2016-01-16 NOTE — Progress Notes (Signed)
Pharmacy Antibiotic Note  Andrew Reynolds is a 70 y.o. male admitted on 01/16/2016 with sepsis.  Pharmacy has been consulted for vancomycin and zosyn dosing.  Plan: Vancomycin 2g x 1 then 1 g IV every 8 hours.  Goal trough 15-20 mcg/mL. Zosyn 3.375g IV q8h (4 hour infusion).  Height: 6' (182.9 cm) Weight: 215 lb (97.5 kg) IBW/kg (Calculated) : 77.6  Temp (24hrs), Avg:95.4 F (35.2 C), Min:95.4 F (35.2 C), Max:95.4 F (35.2 C)   Recent Labs Lab 01/16/16 1819 01/16/16 1832  WBC 23.4*  --   CREATININE 0.57*  --   LATICACIDVEN  --  1.55    Estimated Creatinine Clearance: 104 mL/min (by C-G formula based on SCr of 0.8 mg/dL).    Allergies  Allergen Reactions  . Sulfa Antibiotics    Isaac Bliss, PharmD, BCPS, [facility] Clinical Pharmacist Pager [phone] 01/16/2016 7:07 PM

## 2016-01-16 NOTE — Progress Notes (Signed)
eLink Physician-Brief Progress Note Patient Name: Andrew Reynolds DOB: Apr 22, 1946 MRN: [id number]   Date of Service  01/16/2016  HPI/Events of Note  Rn says foley from [facility] has urine that looks dirty  eICU Interventions  chagne foley     Intervention Category Minor Interventions: Routine modifications to care plan (e.g. PRN medications for pain, fever)  Sakura Denis 01/16/2016, 11:45 PM

## 2016-01-16 NOTE — ED Triage Notes (Signed)
PER CARELINK: Patient to ED from [facility] d/t altered mental status and abnormal ABG done this morning. Per [facility], nursing staff at [facility] say he is normally alert to self, not alert at this time. Vent-dependent trach, FiO2 100%, PCN IP 35. ABG results from [facility]: 7.1, CO2 155 (retains at 70-80 usually), O2 83, HCO3 48, SpO2 88%. Patient has been at [facility] since 12/24/15 d/t mechanical fall in March, resulting in epidural and subdural hematomas and C3-C5 fractures, subsequent quadriplegic and vent dependent. 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 On Eliquis. [facility] also placed double lumen PICC LUA prior to [facility] arrival (not verified). Known enterococcal bacteremia. Extensive hx A-Fib, HTN, R eye prosthesis. FULL CODE.

## 2016-01-16 NOTE — ED Notes (Signed)
PICC line placement verification done by CXR. Switching fluid to PICC.

## 2016-01-16 NOTE — H&P (Signed)
PULMONARY / CRITICAL CARE MEDICINE   Name: Andrew Reynolds MRN: [id number] DOB: 04-May-1946    ADMISSION DATE:  01/16/2016 CONSULTATION DATE:  01/16/16  REFERRING MD:  EDP  CHIEF COMPLAINT:  Altered mental status, low blood pressure  HISTORY OF PRESENT ILLNESS:   Andrew Reynolds is a 70M who presented to the ED from [facility] after staff noted changes to his usual level of alertness and drew a blood gas showing hypercarbia. His PMH is significant for atrial fibrillation, previously on Eliquis. He fell in February of this year and developed hematomas at C6-7-8 that resulted in near complete paralysis. He underwent multiple surgeries and was discharged to [facility]. He then developed a pneumonia and required tracheotomy. He has had 12 or 13 UTIs in the last 4 months with multiple courses of antibiotics. He had a PICC line placed just yesterday by the LTAC. His wife and son are at bedside and report that he desired to remain full code while awaiting neurologic recovery. After decompression, they were told it may take 12 months for him to regain any movement and he has just started moving his hands and feet a bit. Today he was pulling at his trach collar, which is usually the first indication that he has an infection. His son has extensive records - the patient has been treated at multiple different hospitals throughout his course.   On arrival to the ED he was hypotensive, hypothermic and minimally responsive. ABG did demonstrate a respiratory acidosis and he was put on the ventilator. Cultures were drawn, he received IVF and was started on vancomycin and zosyn. Initial chemistries showed hypernatremia with Na 150, normal K, BUN 64 and Cr 0.57. LFTs normal. Albumin 1.7. CBC with WBC 23.4 with left shift, Hgb 9.4 with macrocytosis (unknown baseline) and PLT 280. CXR shows L PICC in adequate position, bilateral pulmonary opacities R > L with bilateral effusions R > L. CT head with no acute intracranial  abnormality. Sinus inflammation of indefinite acuity.  PAST MEDICAL HISTORY :  He  has no past medical history on file.  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  Trach/PEG 3/28,  C3-5, T3-4 laminectomy 2/27,  dural AV fistula resection and cervical fusion 3/6, C6-7 harware revision 3/19.  Enucleation of Rt eye age 1   Allergies  Allergen Reactions  . Sulfa Antibiotics Rash    No current facility-administered medications on file prior to encounter.    No current outpatient prescriptions on file prior to encounter.    FAMILY HISTORY:  His has no family status information on file.    SOCIAL HISTORY: He  does not drink, smoke or do drugs. Currently resides at [facility].   REVIEW OF SYSTEMS:   Unable to obtain 2/2 vent and mental status  SUBJECTIVE:    VITAL SIGNS: BP (!) 73/59   Pulse 93   Temp (!) 95.4 F (35.2 C) (Rectal)   Resp (!) 28   Ht 6' (1.829 m)   Wt 97.5 kg (215 lb)   SpO2 100%   BMI 29.16 kg/m   HEMODYNAMICS:    VENTILATOR SETTINGS: Vent Mode: PRVC FiO2 (%):  [100 %] 100 % Set Rate:  [28 bmp] 28 bmp Vt Set:  [500 mL] 500 mL PEEP:  [10 cmH20] 10 cmH20 Plateau Pressure:  [34 cmH20] 34 cmH20  INTAKE / OUTPUT: No intake/output data recorded.  PHYSICAL EXAMINATION:  General Well nourished, well developed, unresponsive, on vent via tracheostomy  HEENT No gross abnormalities. Oropharynx clear, mucosa dry. Mallampati II. Good dentition. Trach site with moderate tan secretions.   Pulmonary Coarse breath sounds bilaterally, decreased at bilateral bases on anterior/lateral exam. No ronchi or wheeze. Vent-assisted effort, symmetrical expansion.   Cardiovascular Irregularly irregular rhythm. S1, s2. No m/r/g. Distal pulses palpable.  Abdomen Soft,  non-tender, non-distended, positive bowel sounds, no palpable organomegaly or masses. Normoresonant to percussion. PEG site dressing C/D/I.   Musculoskeletal Grossly normal with changes consistent with known paralysis.  Lymphatics No cervical, supraclavicular or axillary adenopathy.   Neurologic Left pupil reactive to light (R eye is glass), spontaneous respiratory effort, unresponsive to stimuli. Flaccid paralysis of upper and lower extremities.  Skin/Integuement No rash, no cyanosis, no clubbing.      LABS:  BMET  Recent Labs Lab 01/16/16 1819  NA 150*  K 4.7  CL 111  CO2 31  BUN 64*  CREATININE 0.57*  GLUCOSE 147*    Electrolytes  Recent Labs Lab 01/16/16 1819  CALCIUM 8.4*    CBC  Recent Labs Lab 01/16/16 1819  WBC 23.4*  HGB 9.4*  HCT 33.9*  PLT 280    Coag's No results for input(s): APTT, INR in the last 168 hours.  Sepsis Markers  Recent Labs Lab 01/16/16 1832  LATICACIDVEN 1.55    ABG  Recent Labs Lab 01/16/16 1829  PHART 7.213*  PCO2ART 86.5*  PO2ART 58.0*    Liver Enzymes  Recent Labs Lab 01/16/16 1819  AST 31  ALT 23  ALKPHOS 69  BILITOT 0.5  ALBUMIN 1.7*    Cardiac Enzymes No results for input(s): TROPONINI, PROBNP in the last 168 hours.  Glucose No results for input(s): GLUCAP in the last 168 hours.  Imaging Ct Head Wo Contrast  Result Date: 01/16/2016 CLINICAL DATA:  Altered mental status.  Sepsis. EXAM: CT HEAD WITHOUT CONTRAST TECHNIQUE: Contiguous axial images were obtained from the base of the skull through the vertex without intravenous contrast. COMPARISON:  None. FINDINGS: Brain: No intracranial hemorrhage, mass effect, or midline shift. Mild generalized at and chronic small vessel ischemia. No hydrocephalus. The basilar cisterns are patent. No evidence of territorial infarct. No intracranial fluid collection. Vascular: No hyperdense vessel or abnormal calcification. Skull:  Calvarium is intact. Sinuses/Orbits:  Right globe prosthesis.Mild mucosal thickening of the ethmoid air cells. There is fluid within right side of sphenoid sinus with mucosal thickening and fluid and bubbly debris. Maxillary sinuses are well-aerated. There is scattered opacification no of the mastoid air cells. Other: None. IMPRESSION: 1. No acute intracranial abnormality. Mild atrophy and chronic small vessel ischemia. 2. Paranasal sinus inflammation with mucosal thickening, fluid and bubbly debris within the right side of sphenoid sinus. Acuity is uncertain. Electronically Signed   By: Rubye Oaks M.D.   On: 01/16/2016 19:42   Dg Chest Port 1 View  Result Date: 01/16/2016 CLINICAL DATA:  Trauma.  Acute mental status change and sepsis. EXAM: PORTABLE CHEST 1 VIEW COMPARISON:  None FINDINGS: A tracheostomy tube is identified. No pneumothorax. A left PICC line terminates in the SVC. Mild cardiomegaly identified. The hila and mediastinum unremarkable. Diffuse bilateral pulmonary opacities, right greater than left with effusions in the bases, also right greater than left. No other acute abnormalities. Surgical hardware in the lower cervical spine IMPRESSION: Probable asymmetric edema with bilateral pleural effusions and underlying opacities. Recommend follow-up to resolution. Electronically  Signed   By: Gerome Sam III M.D   On: 01/16/2016 19:00     STUDIES:    CULTURES: Blood x 2 8/5 >> Urine 8/5 >> Tracheal aspirate 8/5 >>  ANTIBIOTICS: Vancomycin 8/5 >> Zosyn 8/5 >>  SIGNIFICANT EVENTS:   LINES/TUBES: LUE PICC 8/4 >> PEG Trach  DISCUSSION: Mr. Elamin is a 89M with complicated recent medical history who presents with findings concerning for sepsis secondary to urinary vs pulmonary source. His blood pressure remains low despite 2.5L fluid challenge. While he appears to have been dry (hypernatremia, elevated BUN:Cr ratio), this should have been an adequate challenge. He may require pressors. Cultures are pending.  There is an unclear history of upper extremity DVT and he has chronic a fib, but until the history has been confirmed we will provide chemical prophylaxis as opposed to therapeutic anticoagulation. He appears anasarcic and total body fluid up, with obvious third spacing. His nutritional status may be suboptimal with albumin 1.7. He will likely require additional free water for his sodium.   ASSESSMENT / PLAN:  PULMONARY A: Acute on chronic hypoxemic / hypercapneic respiratory failure - baseline is reportedly 65% trach collar without hypercapnea - likely multifactorial and related to sepsis, pulmonary edema, pleural effusions, altered mental status and possible pneumonia Chronic trach P:   Continue ventilatory support  Wean as tolerated  Pulmonary toilet Diurese when BP tolerates Defer thoracentesis for now (prior taps in 10/2015 were transudative and attributed to hypoalbuminemia)  CARDIOVASCULAR A:  Hypotension / septic shock Chronic atrial fibrillation P:  Pressor support with norepinephrine Rate control strategy goal HR < 120 Previously on midodrine - confirm current med list and resume if indicated  RENAL A:   Hypernatremia - likely secondary to inadequate free water and increased insensible losses in setting of sepsis AKI Cr 0.57 (baseline ~0.2) P:   Free water 250 Q4h  Trend BMP q 12h  GASTROINTESTINAL A:   Protein calorie malnutrition with anasarca and albumin 1.7 P:   Consult nutrition for TF Free water as above Famotidine for stress ulcer ppx while on vent  HEMATOLOGIC A:   Leukocytosis with left shift Macrocytic anemia - unknown chronicity Hx DVT s/p IVC filter (?) - mentioned in note from [facility] May 2017 P:  Trend CBC Obtain outside labs and records  INFECTIOUS A:   Severe sepsis / septic shock secondary to urinary vs pulmonary source P:   Antibiosis with vanc / zosyn Follow cultures  ENDOCRINE A:  No acute issues   P:   Follow CBG Check  TSH  NEUROLOGIC A:   Altered mental status, likely toxic metabolic encephalopathy secondary to sepsis 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 As per HPI, family reports patient wishes to remain FULL CODE.  - Inter-disciplinary family meet or Palliative Care meeting due by:  day 7  The patient is critically ill with multiple organ system failure and requires high complexity decision making for assessment and support, frequent evaluation and titration of therapies, advanced monitoring, review of radiographic studies and interpretation of complex data.   Critical Care Time devoted to patient care services, exclusive of separately billable procedures, described in this note is 58 minutes.   Nita Sickle, MD Pulmonary and Critical Care Medicine [facility] Pager: [phone]  01/16/2016, 10:41 PM

## 2016-01-16 NOTE — ED Notes (Signed)
MD at bedside. 

## 2016-01-16 NOTE — ED Notes (Signed)
Pharmacy called to inquire about levo drip, it is being sent now.

## 2016-01-16 NOTE — ED Notes (Signed)
Intensivist at bedside.

## 2016-01-16 NOTE — ED Notes (Signed)
Family now at the bedside, providing more information r/e patient's baseline. Patient is normally able to communicate by moving his hands (not fingers) and eyes, is normally alert and oriented to self. Patient is simultaneously moving eyes, no movement of extremities at this time.

## 2016-01-16 NOTE — Progress Notes (Signed)
Patient came in from [facility] on vent due to LOC placed on above vent setting, MD aware.

## 2016-01-16 NOTE — ED Notes (Signed)
Placed bear-hugger on patient.

## 2016-01-17 DIAGNOSIS — L8990 Pressure ulcer of unspecified site, unspecified stage: Secondary | ICD-10-CM | POA: Insufficient documentation

## 2016-01-17 DIAGNOSIS — N390 Urinary tract infection, site not specified: Secondary | ICD-10-CM

## 2016-01-17 DIAGNOSIS — A419 Sepsis, unspecified organism: Secondary | ICD-10-CM

## 2016-01-17 DIAGNOSIS — R6521 Severe sepsis with septic shock: Secondary | ICD-10-CM

## 2016-01-17 DIAGNOSIS — J9601 Acute respiratory failure with hypoxia: Secondary | ICD-10-CM

## 2016-01-17 LAB — URINE MICROSCOPIC-ADD ON

## 2016-01-17 LAB — URINALYSIS, ROUTINE W REFLEX MICROSCOPIC
GLUCOSE, UA: NEGATIVE mg/dL
KETONES UR: 15 mg/dL — AB
NITRITE: NEGATIVE
PH: 5 (ref 5.0–8.0)
Protein, ur: 100 mg/dL — AB
Specific Gravity, Urine: 1.025 (ref 1.005–1.030)

## 2016-01-17 LAB — BASIC METABOLIC PANEL
Anion gap: 6 (ref 5–15)
BUN: 67 mg/dL — AB (ref 6–20)
CHLORIDE: 111 mmol/L (ref 101–111)
CO2: 33 mmol/L — AB (ref 22–32)
CREATININE: 0.63 mg/dL (ref 0.61–1.24)
Calcium: 8 mg/dL — ABNORMAL LOW (ref 8.9–10.3)
GFR calc Af Amer: >60 mL/min (ref >60)
GFR calc non Af Amer: >60 mL/min (ref >60)
GLUCOSE: 127 mg/dL — AB (ref 65–99)
POTASSIUM: 4.2 mmol/L (ref 3.5–5.1)
SODIUM: 150 mmol/L — AB (ref 135–145)

## 2016-01-17 LAB — MRSA PCR SCREENING: MRSA BY PCR: NEGATIVE

## 2016-01-17 LAB — MAGNESIUM: MAGNESIUM: 2.5 mg/dL — AB (ref 1.7–2.4)

## 2016-01-17 LAB — CBC
HCT: 32.7 % — ABNORMAL LOW (ref 39.0–52.0)
HEMOGLOBIN: 8.7 g/dL — AB (ref 13.0–17.0)
MCH: 27.4 pg (ref 26.0–34.0)
MCHC: 26.6 g/dL — ABNORMAL LOW (ref 30.0–36.0)
MCV: 103.2 fL — AB (ref 78.0–100.0)
Platelets: 282 10*3/uL (ref 150–400)
RBC: 3.17 MIL/uL — AB (ref 4.22–5.81)
RDW: 18.2 % — ABNORMAL HIGH (ref 11.5–15.5)
WBC: 23.7 10*3/uL — AB (ref 4.0–10.5)

## 2016-01-17 LAB — PHOSPHORUS: Phosphorus: 5.5 mg/dL — ABNORMAL HIGH (ref 2.5–4.6)

## 2016-01-17 LAB — GLUCOSE, CAPILLARY: Glucose-Capillary: 115 mg/dL — ABNORMAL HIGH (ref 65–99)

## 2016-01-17 MED ORDER — CHLORHEXIDINE GLUCONATE 0.12 % MT SOLN
15.0000 mL | Freq: Two times a day (BID) | OROMUCOSAL | Status: DC
  Administered 2016-01-17 – 2016-01-20 (×8): 15 mL via OROMUCOSAL

## 2016-01-17 MED ORDER — CETYLPYRIDINIUM CHLORIDE 0.05 % MT LIQD
7.0000 mL | OROMUCOSAL | Status: DC
  Administered 2016-01-17 – 2016-01-19 (×22): 7 mL via OROMUCOSAL

## 2016-01-17 NOTE — Progress Notes (Signed)
Patient has maximum MOV with his trach and still has cuff leak. RT has made MD aware that patient is in need of a trach change.

## 2016-01-17 NOTE — H&P (Signed)
PULMONARY / CRITICAL CARE MEDICINE   Name: Andrew Reynolds MRN: [id number] DOB: 1946-02-12    ADMISSION DATE:  01/16/2016 CONSULTATION DATE:  01/16/16  REFERRING MD:  EDP  CHIEF COMPLAINT:  Altered mental status, low blood pressure  HISTORY OF PRESENT ILLNESS:   Andrew Reynolds is a 67M who presented to the ED from [facility] after staff noted changes to his usual level of alertness and drew a blood gas showing hypercarbia. His PMH is significant for atrial fibrillation, previously on Eliquis. He fell in February of this year and developed hematomas at C6-7-8 that resulted in near complete paralysis. He underwent multiple surgeries and was discharged to [facility]. He then developed a pneumonia and required tracheotomy. He has had 12 or 13 UTIs in the last 4 months with multiple courses of antibiotics. He had a PICC line placed just yesterday 8/4  by the LTAC. His wife and son are at bedside and report that he desired to remain full code while awaiting neurologic recovery. After decompression, they were told it may take 12 months for him to regain any movement and he has just started moving his hands and feet a bit. Today he was pulling at his trach collar, which is usually the first indication that he has an infection. His son has extensive records - the patient has been treated at multiple different hospitals throughout his course.   On arrival to the ED he was hypotensive, hypothermic and minimally responsive. ABG did demonstrate a respiratory acidosis and he was put on the ventilator. Cultures were drawn, he received IVF and was started on vancomycin and zosyn. Initial chemistries showed hypernatremia with Na 150, normal K, BUN 64 and Cr 0.57. LFTs normal. Albumin 1.7. CBC with WBC 23.4 with left shift, Hgb 9.4 with macrocytosis (unknown baseline) and PLT 280. CXR shows L PICC in adequate position, bilateral pulmonary opacities R > L with bilateral effusions R > L. CT head with no acute intracranial  abnormality. Sinus inflammation of indefinite acuity.    SUBJECTIVE:  Resting on vent  Weaned off pressors   VITAL SIGNS: BP 98/65   Pulse (!) 111   Temp 100.2 F (37.9 C) (Oral)   Resp 19   Ht 6' (1.829 m)   Wt 101 kg (222 lb 10.6 oz)   SpO2 97%   BMI 30.20 kg/m   HEMODYNAMICS:    VENTILATOR SETTINGS: Vent Mode: PRVC FiO2 (%):  [80 %-100 %] 80 % Set Rate:  [28 bmp] 28 bmp Vt Set:  [500 mL] 500 mL PEEP:  [10 cmH20] 10 cmH20 Plateau Pressure:  [34 cmH20-36 cmH20] 36 cmH20  INTAKE / OUTPUT: I/O last 3 completed shifts: In: 3491 [I.V.:3241; IV Piggyback:250] Out: 350 [Urine:350]  PHYSICAL EXAMINATION:  General Well nourished, well developed,  on vent via tracheostomy  HEENT No gross abnormalities. Oropharynx clear, mucosa dry. Mallampati II. Good dentition. Trach site with moderate tan secretions.   Pulmonary Coarse breath sounds bilaterally, decreased at bilateral bases on anterior/lateral exam. No ronchi or wheeze. Vent-assisted effort, symmetrical expansion.   Cardiovascular Irregularly irregular rhythm. S1, s2. No m/r/g. Distal pulses palpable.  Abdomen Soft, non-tender, non-distended, positive bowel sounds, no palpable organomegaly or masses. Normoresonant to percussion. PEG site dressing C/D/I.   Musculoskeletal Grossly normal with changes consistent with known paralysis.  Lymphatics No cervical, supraclavicular or axillary adenopathy.   Neurologic Left pupil reactive to light (R eye is glass), spontaneous respiratory effort, unresponsive to stimuli. Flaccid paralysis of upper and lower extremities.  Skin/Integuement  No rash, no cyanosis, no clubbing.      LABS:  BMET  Recent Labs Lab 01/16/16 1819 01/17/16 0022  NA 150* 150*  K 4.7 4.2  CL 111 111  CO2 31 33*  BUN 64* 67*  CREATININE 0.57* 0.63  GLUCOSE 147* 127*    Electrolytes  Recent Labs Lab 01/16/16 1819 01/17/16 0022  CALCIUM 8.4* 8.0*  MG  --  2.5*  PHOS  --  5.5*     CBC  Recent Labs Lab 01/16/16 1819 01/17/16 0022  WBC 23.4* 23.7*  HGB 9.4* 8.7*  HCT 33.9* 32.7*  PLT 280 282    Coag's No results for input(s): APTT, INR in the last 168 hours.  Sepsis Markers  Recent Labs Lab 01/16/16 1832  LATICACIDVEN 1.55    ABG  Recent Labs Lab 01/16/16 1829  PHART 7.213*  PCO2ART 86.5*  PO2ART 58.0*    Liver Enzymes  Recent Labs Lab 01/16/16 1819  AST 31  ALT 23  ALKPHOS 69  BILITOT 0.5  ALBUMIN 1.7*    Cardiac Enzymes No results for input(s): TROPONINI, PROBNP in the last 168 hours.  Glucose  Recent Labs Lab 01/16/16 2323  GLUCAP 115*    Imaging Ct Head Wo Contrast  Result Date: 01/16/2016 CLINICAL DATA:  Altered mental status.  Sepsis. EXAM: CT HEAD WITHOUT CONTRAST TECHNIQUE: Contiguous axial images were obtained from the base of the skull through the vertex without intravenous contrast. COMPARISON:  None. FINDINGS: Brain: No intracranial hemorrhage, mass effect, or midline shift. Mild generalized at and chronic small vessel ischemia. No hydrocephalus. The basilar cisterns are patent. No evidence of territorial infarct. No intracranial fluid collection. Vascular: No hyperdense vessel or abnormal calcification. Skull:  Calvarium is intact. Sinuses/Orbits: Right globe prosthesis.Mild mucosal thickening of the ethmoid air cells. There is fluid within right side of sphenoid sinus with mucosal thickening and fluid and bubbly debris. Maxillary sinuses are well-aerated. There is scattered opacification no of the mastoid air cells. Other: None. IMPRESSION: 1. No acute intracranial abnormality. Mild atrophy and chronic small vessel ischemia. 2. Paranasal sinus inflammation with mucosal thickening, fluid and bubbly debris within the right side of sphenoid sinus. Acuity is uncertain. Electronically Signed   By: Rubye Oaks M.D.   On: 01/16/2016 19:42   Dg Chest Port 1 View  Result Date: 01/16/2016 CLINICAL DATA:  Trauma.  Acute  mental status change and sepsis. EXAM: PORTABLE CHEST 1 VIEW COMPARISON:  None FINDINGS: A tracheostomy tube is identified. No pneumothorax. A left PICC line terminates in the SVC. Mild cardiomegaly identified. The hila and mediastinum unremarkable. Diffuse bilateral pulmonary opacities, right greater than left with effusions in the bases, also right greater than left. No other acute abnormalities. Surgical hardware in the lower cervical spine IMPRESSION: Probable asymmetric edema with bilateral pleural effusions and underlying opacities. Recommend follow-up to resolution. Electronically Signed   By: Gerome Sam III M.D   On: 01/16/2016 19:00     STUDIES:    CULTURES: Blood x 2 8/5 >> Urine 8/5 >> Tracheal aspirate 8/5 >>few GPR, GPC  ANTIBIOTICS: Vancomycin 8/5 >> Zosyn 8/5 >>  SIGNIFICANT EVENTS: Trach/PEG 3/28,  C3-5, T3-4 laminectomy 2/27,  dural AV fistula resection and cervical fusion 3/6, C6-7 harware revision 3/19.    LINES/TUBES: LUE PICC 8/4 >> PEG Trach  DISCUSSION: Andrew Reynolds is a 62M with complicated recent medical history who presents with findings concerning for sepsis secondary to urinary vs pulmonary source. His blood pressure remains low despite 2.5L  fluid challenge. While he appears to have been dry (hypernatremia, elevated BUN:Cr ratio), this should have been an adequate challenge. He may require pressors. Cultures are pending. There is an unclear history of upper extremity DVT and he has chronic a fib, but until the history has been confirmed we will provide chemical prophylaxis as opposed to therapeutic anticoagulation. He appears anasarcic and total body fluid up, with obvious third spacing. His nutritional status may be suboptimal with albumin 1.7. He will likely require additional free water for his sodium.   ASSESSMENT / PLAN:  PULMONARY A: Acute on chronic hypoxemic / hypercapneic respiratory failure - baseline is reportedly 65% trach collar  without hypercapnea - likely multifactorial and related to sepsis, pulmonary edema, pleural effusions, altered mental status and possible pneumonia Chronic trach P:   Continue ventilatory support  Wean as tolerated  Pulmonary toilet Diurese when BP tolerates Defer thoracentesis for now (prior taps in 10/2015 were transudative and attributed to hypoalbuminemia) Check cxr in am .   CARDIOVASCULAR A:  Hypotension / septic shock Chronic atrial fibrillation Weaned off pressors 8/6  P:  Rate control strategy goal HR < 120 Previously on midodrine - confirm current med list and resume if indicated  RENAL A:   Hypernatremia - likely secondary to inadequate free water and increased insensible losses in setting of sepsis AKI Cr 0.57 (baseline ~0.2) P:   Free water 250 Q4h  Check bmet in am   GASTROINTESTINAL A:   Protein calorie malnutrition with anasarca and albumin 1.7 P:   Consult nutrition for TF Free water as above Famotidine for stress ulcer ppx while on vent  HEMATOLOGIC A:   Leukocytosis with left shift Macrocytic anemia - unknown chronicity Hx DVT s/p IVC filter (?) - mentioned in note from [facility] May 2017 P:  Trend CBC Obtain outside labs and records  INFECTIOUS A:   Severe sepsis / septic shock secondary to urinary vs pulmonary source P:   Antibiosis with vanc / zosyn Follow cultures  ENDOCRINE A:  No acute issues   P:   Follow CBG Check TSH  NEUROLOGIC A:   Altered mental status, likely toxic metabolic encephalopathy secondary to sepsis 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 As per HPI, family reports patient wishes to remain FULL CODE.  - Inter-disciplinary family meet or Palliative Care meeting due by:  day 7    Wanza Szumski NP-C   Pulmonary and Critical Care  [phone]   01/17/2016, 12:51 PM

## 2016-01-17 NOTE — Progress Notes (Signed)
Pt arrived to unit via stretcher from the ED with RN and RT present.  See flowsheets for assessments and vitals.  No family currently present.

## 2016-01-18 ENCOUNTER — Inpatient Hospital Stay (HOSPITAL_COMMUNITY): Payer: Medicare Other

## 2016-01-18 DIAGNOSIS — J9621 Acute and chronic respiratory failure with hypoxia: Secondary | ICD-10-CM

## 2016-01-18 LAB — BLOOD CULTURE ID PANEL (REFLEXED)
Acinetobacter baumannii: NOT DETECTED
CANDIDA TROPICALIS: NOT DETECTED
CARBAPENEM RESISTANCE: NOT DETECTED
Candida albicans: NOT DETECTED
Candida glabrata: NOT DETECTED
Candida krusei: NOT DETECTED
Candida parapsilosis: NOT DETECTED
ENTEROBACTER CLOACAE COMPLEX: NOT DETECTED
ENTEROCOCCUS SPECIES: NOT DETECTED
Enterobacteriaceae species: NOT DETECTED
Escherichia coli: NOT DETECTED
HAEMOPHILUS INFLUENZAE: NOT DETECTED
Klebsiella oxytoca: NOT DETECTED
Klebsiella pneumoniae: NOT DETECTED
LISTERIA MONOCYTOGENES: NOT DETECTED
METHICILLIN RESISTANCE: NOT DETECTED
Neisseria meningitidis: NOT DETECTED
PROTEUS SPECIES: NOT DETECTED
Pseudomonas aeruginosa: NOT DETECTED
SERRATIA MARCESCENS: NOT DETECTED
STAPHYLOCOCCUS AUREUS BCID: NOT DETECTED
STAPHYLOCOCCUS SPECIES: NOT DETECTED
STREPTOCOCCUS PYOGENES: NOT DETECTED
Streptococcus agalactiae: NOT DETECTED
Streptococcus pneumoniae: NOT DETECTED
Streptococcus species: NOT DETECTED
VANCOMYCIN RESISTANCE: NOT DETECTED

## 2016-01-18 LAB — GLUCOSE, CAPILLARY
GLUCOSE-CAPILLARY: 110 mg/dL — AB (ref 65–99)
GLUCOSE-CAPILLARY: 115 mg/dL — AB (ref 65–99)

## 2016-01-18 LAB — CBC
HCT: 29.1 % — ABNORMAL LOW (ref 39.0–52.0)
HEMOGLOBIN: 8.1 g/dL — AB (ref 13.0–17.0)
MCH: 27 pg (ref 26.0–34.0)
MCHC: 27.8 g/dL — ABNORMAL LOW (ref 30.0–36.0)
MCV: 97 fL (ref 78.0–100.0)
PLATELETS: 280 10*3/uL (ref 150–400)
RBC: 3 MIL/uL — ABNORMAL LOW (ref 4.22–5.81)
RDW: 17.7 % — AB (ref 11.5–15.5)
WBC: 21.7 10*3/uL — ABNORMAL HIGH (ref 4.0–10.5)

## 2016-01-18 LAB — PHOSPHORUS
Phosphorus: 4.3 mg/dL (ref 2.5–4.6)
Phosphorus: 4.4 mg/dL (ref 2.5–4.6)

## 2016-01-18 LAB — BASIC METABOLIC PANEL
ANION GAP: 6 (ref 5–15)
BUN: 62 mg/dL — ABNORMAL HIGH (ref 6–20)
CALCIUM: 8.3 mg/dL — AB (ref 8.9–10.3)
CO2: 32 mmol/L (ref 22–32)
Chloride: 111 mmol/L (ref 101–111)
Creatinine, Ser: 0.66 mg/dL (ref 0.61–1.24)
GFR calc Af Amer: >60 mL/min (ref >60)
GLUCOSE: 75 mg/dL (ref 65–99)
POTASSIUM: 3.8 mmol/L (ref 3.5–5.1)
SODIUM: 149 mmol/L — AB (ref 135–145)

## 2016-01-18 LAB — TSH: TSH: 1.813 u[IU]/mL (ref 0.350–4.500)

## 2016-01-18 LAB — MAGNESIUM
MAGNESIUM: 2.6 mg/dL — AB (ref 1.7–2.4)
Magnesium: 2.6 mg/dL — ABNORMAL HIGH (ref 1.7–2.4)

## 2016-01-18 LAB — VANCOMYCIN, TROUGH: Vancomycin Tr: 40 ug/mL (ref 15–20)

## 2016-01-18 MED ORDER — COLLAGENASE 250 UNIT/GM EX OINT
TOPICAL_OINTMENT | Freq: Every day | CUTANEOUS | Status: DC
  Administered 2016-01-18 – 2016-01-19 (×2): 1 via TOPICAL
  Administered 2016-01-20 – 2016-01-22 (×3): via TOPICAL
  Filled 2016-01-18: qty 30

## 2016-01-18 MED ORDER — PRO-STAT SUGAR FREE PO LIQD
30.0000 mL | Freq: Every day | ORAL | Status: DC
  Administered 2016-01-19 – 2016-01-22 (×4): 30 mL
  Filled 2016-01-18 (×4): qty 30

## 2016-01-18 MED ORDER — POTASSIUM CHLORIDE 20 MEQ/15ML (10%) PO SOLN
40.0000 meq | Freq: Once | ORAL | Status: AC
  Administered 2016-01-18: 40 meq
  Filled 2016-01-18: qty 30

## 2016-01-18 MED ORDER — VITAL AF 1.2 CAL PO LIQD
1000.0000 mL | ORAL | Status: DC
Start: 2016-01-18 — End: 2016-01-22
  Administered 2016-01-18 – 2016-01-21 (×6): 1000 mL

## 2016-01-18 MED ORDER — VITAL HIGH PROTEIN PO LIQD: 1000.0000 mL | ORAL | Status: DC

## 2016-01-18 MED ORDER — FUROSEMIDE 10 MG/ML IJ SOLN
40.0000 mg | Freq: Three times a day (TID) | INTRAMUSCULAR | Status: DC
  Administered 2016-01-18 – 2016-01-22 (×14): 40 mg via INTRAVENOUS
  Filled 2016-01-18 (×15): qty 4

## 2016-01-18 MED ORDER — PRO-STAT SUGAR FREE PO LIQD
30.0000 mL | Freq: Two times a day (BID) | ORAL | Status: DC
  Administered 2016-01-18: 30 mL
  Filled 2016-01-18: qty 30

## 2016-01-18 NOTE — Progress Notes (Signed)
Vancomycin Trough= 40 per Pharmacist to hold Vancomycin dose.  Corliss Skains RN

## 2016-01-18 NOTE — Progress Notes (Signed)
PULMONARY / CRITICAL CARE MEDICINE   Name: Andrew Reynolds MRN: [id number] DOB: [birth year]    ADMISSION DATE:  01/16/2016 CONSULTATION DATE:  01/16/16  REFERRING MD:  EDP  CHIEF COMPLAINT:  Altered mental status, low blood pressure  HISTORY OF PRESENT ILLNESS:   Andrew Reynolds is a 72M who presented to the ED from [facility] after staff noted changes to his usual level of alertness and drew a blood gas showing hypercarbia. His PMH is significant for atrial fibrillation, previously on Eliquis. He fell in February of this year and developed hematomas at C6-7-8 that resulted in near complete paralysis. He underwent multiple surgeries and was discharged to [facility]. He then developed a pneumonia and required tracheotomy. He has had 12 or 13 UTIs in the last 4 months with multiple courses of antibiotics. He had a PICC line placed just yesterday 8/4  by the [facility]. His wife and son are at bedside and report that he desired to remain full code while awaiting neurologic recovery. After decompression, they were told it may take 12 months for him to regain any movement and he has just started moving his hands and feet a bit. Today he was pulling at his trach collar, which is usually the first indication that he has an infection. His son has extensive records - the patient has been treated at multiple different hospitals throughout his course.  Has IVC filter placed @ [facility]  On arrival to the ED he was hypotensive, hypothermic and minimally responsive. ABG did demonstrate a respiratory acidosis and he was put on the ventilator. Cultures were drawn, he received IVF and was started on vancomycin and zosyn. Initial chemistries showed hypernatremia with Na 150, normal K, BUN 64 and Cr 0.57. LFTs normal. Albumin 1.7. CBC with WBC 23.4 with left shift, Hgb 9.4 with macrocytosis (unknown baseline) and PLT 280. CXR shows L PICC in adequate position, bilateral pulmonary opacities R > L with bilateral effusions R > L. CT head  with no acute intracranial abnormality. Sinus inflammation of indefinite acuity.    SUBJECTIVE:   off pressors  On 60%/ +10 Awake afebrile  VITAL SIGNS: BP 104/70   Pulse 100   Temp 98.9 F (37.2 C) (Oral)   Resp (!) 28   Ht 6' (1.829 m)   Wt 227 lb 8.2 oz (103.2 kg)   SpO2 96%   BMI 30.86 kg/m   HEMODYNAMICS:    VENTILATOR SETTINGS: Vent Mode: PRVC FiO2 (%):  [60 %-80 %] 60 % Set Rate:  [28 bmp] 28 bmp Vt Set:  [500 mL] 500 mL PEEP:  [10 cmH20] 10 cmH20 Plateau Pressure:  [29 cmH20-37 cmH20] 36 cmH20  INTAKE / OUTPUT: I/O last 3 completed shifts: In: 4741 [I.V.:3241; NG/GT:500; IV Piggyback:1000] Out: 1690 [Urine:1690]  PHYSICAL EXAMINATION:  General Well nourished, well developed,  on vent via tracheostomy  HEENT No gross abnormalities. Oropharynx clear, mucosa dry. Mallampati II. Good dentition. Trach site with moderate tan secretions.   Pulmonary Coarse breath sounds bilaterally, decreased at bilateral bases on anterior/lateral exam. No ronchi or wheeze. Vent-assisted effort, symmetrical expansion.   Cardiovascular Irregularly irregular rhythm. S1, s2. No m/r/g. Distal pulses palpable.  Abdomen Soft, non-tender, non-distended, positive bowel sounds, no palpable organomegaly or masses. Normoresonant to percussion. PEG site dressing C/D/I.   Musculoskeletal Grossly normal with changes consistent with known paralysis.  Lymphatics No cervical, supraclavicular or axillary adenopathy.   Neurologic Left pupil reactive to light (R eye is glass), . 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      LABS:  BMET  Recent Labs Lab 01/16/16 1819 01/17/16 0022 01/18/16 0409  NA 150* 150* 149*  K 4.7 4.2 3.8  CL 111 111 111  CO2 31 33* 32  BUN 64* 67* 62*  CREATININE 0.57* 0.63 0.66  GLUCOSE 147* 127* 75    Electrolytes  Recent Labs Lab 01/16/16 1819 01/17/16 0022 01/18/16 0409   CALCIUM 8.4* 8.0* 8.3*  MG  --  2.5*  --   PHOS  --  5.5*  --     CBC  Recent Labs Lab 01/16/16 1819 01/17/16 0022 01/18/16 0409  WBC 23.4* 23.7* 21.7*  HGB 9.4* 8.7* 8.1*  HCT 33.9* 32.7* 29.1*  PLT 280 282 280    Coag's No results for input(s): APTT, INR in the last 168 hours.  Sepsis Markers  Recent Labs Lab 01/16/16 1832  LATICACIDVEN 1.55    ABG  Recent Labs Lab 01/16/16 1829  PHART 7.213*  PCO2ART 86.5*  PO2ART 58.0*    Liver Enzymes  Recent Labs Lab 01/16/16 1819  AST 31  ALT 23  ALKPHOS 69  BILITOT 0.5  ALBUMIN 1.7*    Cardiac Enzymes No results for input(s): TROPONINI, PROBNP in the last 168 hours.  Glucose  Recent Labs Lab 01/16/16 2323  GLUCAP 115*    Imaging Dg Chest Port 1 View  Result Date: 01/18/2016 CLINICAL DATA:  Respiratory failure. EXAM: PORTABLE CHEST 1 VIEW COMPARISON:  01/16/2016. FINDINGS: Tracheostomy tube, left PICC line stable position. Heart size stable. Diffuse bilateral pulmonary infiltrates and/or edema with bilateral pleural effusions again noted. IMPRESSION: 1. Tracheostomy tube and left PICC line stable position. 2. Diffuse bilateral pulmonary infiltrates and/or edema with bilateral pleural effusions again noted. No interim change. Electronically Signed   By: Maisie Fus  Register   On: 01/18/2016 07:00     STUDIES:    CULTURES: Blood x 2 8/5 >> GPC s clusters >> Urine 8/5 >> Tracheal aspirate 8/5 >>few GPR, GPC  ANTIBIOTICS: Vancomycin 8/5 >> Zosyn 8/5 >>  SIGNIFICANT EVENTS: Trach/PEG 3/28,  C3-5, T3-4 laminectomy 2/27,  dural AV fistula resection and cervical fusion 3/6, C6-7 harware revision 3/19.    LINES/TUBES: LUE PICC 8/4 >> PEG Trach  DISCUSSION:  Septic shock has resolved ARDS range hypoxia  - unexplained, has IVC filter so PE less likely   He appears anasarcic and total body fluid up, with obvious third spacing. His nutritional status may be suboptimal with albumin 1.7.    ASSESSMENT / PLAN:  PULMONARY A: Acute on chronic hypoxemic / hypercapneic respiratory failure - baseline is reportedly 65% trach collar without hypercapnea - likely multifactorial and related to sepsis, pulmonary edema, pleural effusions, altered mental status and possible pneumonia Chronic trach Chronic Rt effusion -(prior taps in 10/2015 were transudative and attributed to hypoalbuminemia) P:   Continue ventilatory support  Wean FIO2 as tolerated  Pulmonary toilet Diurese when BP tolerates Defer thoracentesis for now  May need trach change one PEEP down  CARDIOVASCULAR A:   septic shock Chronic atrial fibrillation Weaned off pressors 8/6  P:  Rate control strategy goal HR < 120 Previously on midodrine - confirm current med list and resume if indicated  RENAL A:   Hypernatremia - likely secondary to inadequate free water and increased insensible losses in setting of sepsis AKI Cr 0.57 (baseline ~0.2) P:   Free water 250 Q4h  Lasix 40 q 8H Check bmet in  am   GASTROINTESTINAL A:   Protein calorie malnutrition with anasarca and albumin 1.7 P:   Consult nutrition for TF Free water as above Famotidine for stress ulcer ppx while on vent  HEMATOLOGIC A:   Leukocytosis with left shift Macrocytic anemia - unknown chronicity Hx DVT s/p IVC filter  - mentioned in [facility] from [facility] P:  Trend CBC Keep proph lovenox Duplex BUE & LEs  INFECTIOUS A:   Severe sepsis / septic shock secondary to urinary vs pulmonary source GPC bacteremia Multiple decubs - sacrum, left heel - WOC following P:   Antibiosis with vanc / zosyn Follow cultures Consider change PICC, foley changed on adm  ENDOCRINE A:  No acute issues   P:   Follow CBG   NEUROLOGIC A:   Altered mental status, likely toxic metabolic encephalopathy secondary to sepsis 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 family reports patient wishes to remain FULL CODE.  - Inter-disciplinary family meet or Palliative Care meeting due by:  day 7    cc time x 35 m  Cyril Mourning MD. [facility]. [place] Pulmonary & Critical care Pager [phone] If no response call [phone]   01/18/2016   01/18/2016, 9:30 AM

## 2016-01-18 NOTE — Progress Notes (Signed)
Initial Nutrition Assessment  DOCUMENTATION CODES:   Not applicable  INTERVENTION:    Initiate TF via PEG with Vital AF 1.2 at goal rate of 75 ml/h (1800 ml per day) and Prostat 30 ml once daily to provide 2260 kcals, 150 gm protein, 1460 ml free water daily.  NUTRITION DIAGNOSIS:   Inadequate oral intake related to inability to eat as evidenced by NPO status.  GOAL:   Patient will meet greater than or equal to 90% of their needs  MONITOR:   Vent status, Labs, Weight trends, TF tolerance, Skin, I & O's  REASON FOR ASSESSMENT:   Consult Enteral/tube feeding initiation and management  ASSESSMENT:   20M who presented to the ED from [facility] after staff noted changes to his usual level of alertness and drew a blood gas showing hypercarbia. His PMH is significant for atrial fibrillation, previously on Eliquis. He fell in February of this year and developed hematomas at C6-7-8 that resulted in near complete paralysis. He underwent multiple surgeries and was discharged to [facility]. He then developed a pneumonia and required tracheotomy. He has had 12 or 13 UTIs in the last 4 months with multiple courses of antibiotics.    Labs reviewed: sodium & magnesium elevated. Medications reviewed and include Lasix. Nutrition-Focused physical exam completed. Findings are mild-moderate muscle depletion, and severe edema.  Patient is currently intubated on ventilator support MV: 13.6 L/min Temp (24hrs), Avg:99.1 F (37.3 C), Min:98.4 F (36.9 C), Max:100.2 F (37.9 C)   Diet Order:  Diet NPO time specified  Skin:  Wounds: 2 stage 3's to sacrum; Unstageable to buttocks; DTI to heel; Stage 1 to arm)  Last BM:  8/5  Height:   Ht Readings from Last 1 Encounters:  01/16/16 6' (1.829 m)    Weight:   Wt Readings from Last 1 Encounters:  01/18/16 227 lb 8.2 oz (103.2 kg)    Ideal Body Weight:  80.9 kg  BMI:  29.1 (using admit weight of 97.5 kg)  Estimated Nutritional Needs:   Kcal:   2245  Protein:  135-155 gm  Fluid:  2.3 L  EDUCATION NEEDS:   No education needs identified at this time   Joaquin Courts, RD, LDN, CNSC Pager [phone] After Hours Pager [phone]

## 2016-01-18 NOTE — Progress Notes (Signed)
Pharmacy Antibiotic Note- Follow- Up Note   Andrew Reynolds is a 70 y.o. male admitted on 01/16/2016 with PNA/UTI.  Pharmacy has been consulted for vanc/zosyn  dosing.  Plan: 8/7 VT - 40  We will hold vanc for now and check a random trough.  Monitor renal fxn, clinical s/sx VT goal- 15-20  Height: 6' (182.9 cm) Weight: 227 lb 8.2 oz (103.2 kg) IBW/kg (Calculated) : 77.6  Temp (24hrs), Avg:98.9 F (37.2 C), Min:98.4 F (36.9 C), Max:99.3 F (37.4 C)   Recent Labs Lab 01/16/16 1819 01/16/16 1832 01/17/16 0022 01/18/16 0409 01/18/16 0925  WBC 23.4*  --  23.7* 21.7*  --   CREATININE 0.57*  --  0.63 0.66  --   LATICACIDVEN  --  1.55  --   --   --   VANCOTROUGH  --   --   --   --  40*    Estimated Creatinine Clearance: 106.7 mL/min (by C-G formula based on SCr of 0.8 mg/dL).    Allergies  Allergen Reactions  . Sulfa Antibiotics Rash    Antimicrobials this admission: Zosyn 8/5 >>  Vanc 8/5 >>     Microbiology results: 8/5 BCx: Gm positive cocci in clusters  8/5 UCx: 60,000 Gm neg rod  8/5 MRSA PCR: Neg 8/6 RCx: Few Gm Positive rods, Few gm positive cocci in pairs  Thank you for allowing pharmacy to be a part of this patient's care.  Hillis Range 01/18/2016 12:14 PM

## 2016-01-18 NOTE — Progress Notes (Signed)
PHARMACY - PHYSICIAN COMMUNICATION CRITICAL VALUE ALERT - BLOOD CULTURE IDENTIFICATION (BCID)   No results found for this or any previous visit.   2/2 blood cultures GPC in clusters.  No results on BCID  Name of physician (or Provider) Contacted:  Dr. Marchelle Gearing  Changes to prescribed antibiotics required:   No changes needed already on Vancomycin and Zosyn  Eddie Candle 01/18/2016  4:54 AM

## 2016-01-18 NOTE — Consult Note (Addendum)
WOC Nurse wound consult note Reason for Consult: Consult requested for several wounds.  Upper sacrum with stage 3 pressure injury; 2.X2X.2cm, dark red and moist, small amt yellow drainage, no odor. Lower sacrum with stage 3 pressure injury; .8X.8X.2cm with the same appearance. Right buttock with unstageable wounds in 2 locations;  2.3X2cm and .5X.5cm; both are 85% yellow slough, 15% red, small amt yellow drainage, some slight odor. Left heel with deep tissue injury; .3X.3cm dark reddish purple.  Left anterior foot with deep tissue injury; .3X.3cm dark reddish purple. Left arm with stage 1 pressure injury .2X.2cm underneath PIC line site. Pressure Ulcer POA: Yes to all sites noted Dressing procedure/placement/frequency: Foam dressing to left foot, heel and arm to protect and promote healing.  Float heel to reduce pressure.   Aquacel to absorb drainage and provide antimicrobial benefits to sacrum wounds.  Santyl to provide enzymatic debridement of nonviable tissue to right buttock wounds.  No family present to discuss plan of care,  Pt is on a Sport low airloss bed to reduce pressure. Please re-consult if further assistance is needed.  Thank-you,  Cammie Mcgee MSN, RN, CWOCN, [place], CNS [phone]

## 2016-01-18 NOTE — Care Management Note (Addendum)
Case Management Note  Patient Details  Name: Andrew Reynolds MRN: [id number] Date of Birth: 10-Jun-1946  Subjective/Objective:    Admitted with  hypercarbic respiratory failure with hypotension, hypothermia and minimal responsiveness.               Action/Plan:  From [facility] - resident since March 2017 - pt is quad and trach collar/vent dependent at home.  CSW consulted.  CM will continue to follow for discharge needs   Expected Discharge Date:  01/25/16               Expected Discharge Plan:  Skilled Nursing Facility  In-House Referral:     Discharge planning Services  CM Consult  Post Acute Care Choice:    Choice offered to:     DME Arranged:    DME Agency:     HH Arranged:    HH Agency:     Status of Service:  In process, will continue to follow  If discussed at Long Length of Stay Meetings, dates discussed:    Additional Comments:  Cherylann Parr, RN 01/18/2016, 4:12 PM

## 2016-01-18 NOTE — Progress Notes (Addendum)
Patient from [facility] SN[facility] (been at [facility] since 12/24/15) d/t mechanical fall in March resulting in epidural and subdural hematomas and C3-C5 fractures, subsequent quadriplegic and vent dependent. CSW following for disposition back to SNF once medically cleared and Patient agreeable to transport back.          Lance Muss, LCSW [facility] ED/67M Clinical Social Worker [phone]

## 2016-01-19 ENCOUNTER — Inpatient Hospital Stay (HOSPITAL_COMMUNITY): Payer: Medicare Other

## 2016-01-19 DIAGNOSIS — I4891 Unspecified atrial fibrillation: Secondary | ICD-10-CM

## 2016-01-19 DIAGNOSIS — B9689 Other specified bacterial agents as the cause of diseases classified elsewhere: Secondary | ICD-10-CM

## 2016-01-19 DIAGNOSIS — I495 Sick sinus syndrome: Secondary | ICD-10-CM | POA: Insufficient documentation

## 2016-01-19 DIAGNOSIS — Z86718 Personal history of other venous thrombosis and embolism: Secondary | ICD-10-CM

## 2016-01-19 DIAGNOSIS — Z452 Encounter for adjustment and management of vascular access device: Secondary | ICD-10-CM

## 2016-01-19 DIAGNOSIS — J80 Acute respiratory distress syndrome: Secondary | ICD-10-CM

## 2016-01-19 DIAGNOSIS — J9690 Respiratory failure, unspecified, unspecified whether with hypoxia or hypercapnia: Secondary | ICD-10-CM | POA: Insufficient documentation

## 2016-01-19 DIAGNOSIS — A411 Sepsis due to other specified staphylococcus: Principal | ICD-10-CM

## 2016-01-19 DIAGNOSIS — J151 Pneumonia due to Pseudomonas: Secondary | ICD-10-CM

## 2016-01-19 DIAGNOSIS — Z9911 Dependence on respirator [ventilator] status: Secondary | ICD-10-CM

## 2016-01-19 DIAGNOSIS — L8990 Pressure ulcer of unspecified site, unspecified stage: Secondary | ICD-10-CM

## 2016-01-19 DIAGNOSIS — Z7401 Bed confinement status: Secondary | ICD-10-CM

## 2016-01-19 DIAGNOSIS — B965 Pseudomonas (aeruginosa) (mallei) (pseudomallei) as the cause of diseases classified elsewhere: Secondary | ICD-10-CM

## 2016-01-19 DIAGNOSIS — Z1612 Extended spectrum beta lactamase (ESBL) resistance: Secondary | ICD-10-CM

## 2016-01-19 DIAGNOSIS — R40243 Glasgow coma scale score 3-8, unspecified time: Secondary | ICD-10-CM

## 2016-01-19 DIAGNOSIS — J9610 Chronic respiratory failure, unspecified whether with hypoxia or hypercapnia: Secondary | ICD-10-CM

## 2016-01-19 DIAGNOSIS — Z2239 Carrier of other specified bacterial diseases: Secondary | ICD-10-CM

## 2016-01-19 DIAGNOSIS — J9621 Acute and chronic respiratory failure with hypoxia: Secondary | ICD-10-CM

## 2016-01-19 DIAGNOSIS — R7881 Bacteremia: Secondary | ICD-10-CM

## 2016-01-19 DIAGNOSIS — A498 Other bacterial infections of unspecified site: Secondary | ICD-10-CM

## 2016-01-19 DIAGNOSIS — B957 Other staphylococcus as the cause of diseases classified elsewhere: Secondary | ICD-10-CM

## 2016-01-19 DIAGNOSIS — G8250 Quadriplegia, unspecified: Secondary | ICD-10-CM

## 2016-01-19 DIAGNOSIS — A4181 Sepsis due to Enterococcus: Secondary | ICD-10-CM | POA: Insufficient documentation

## 2016-01-19 LAB — URINE CULTURE

## 2016-01-19 LAB — BASIC METABOLIC PANEL
Anion gap: 8 (ref 5–15)
BUN: 66 mg/dL — ABNORMAL HIGH (ref 6–20)
CALCIUM: 8.1 mg/dL — AB (ref 8.9–10.3)
CO2: 32 mmol/L (ref 22–32)
CREATININE: 0.71 mg/dL (ref 0.61–1.24)
Chloride: 108 mmol/L (ref 101–111)
GFR calc non Af Amer: >60 mL/min (ref >60)
GLUCOSE: 155 mg/dL — AB (ref 65–99)
Potassium: 3.6 mmol/L (ref 3.5–5.1)
Sodium: 148 mmol/L — ABNORMAL HIGH (ref 135–145)

## 2016-01-19 LAB — CBC
HCT: 33.3 % — ABNORMAL LOW (ref 39.0–52.0)
Hemoglobin: 9.5 g/dL — ABNORMAL LOW (ref 13.0–17.0)
MCH: 27.1 pg (ref 26.0–34.0)
MCHC: 28.5 g/dL — AB (ref 30.0–36.0)
MCV: 94.9 fL (ref 78.0–100.0)
PLATELETS: 290 10*3/uL (ref 150–400)
RBC: 3.51 MIL/uL — ABNORMAL LOW (ref 4.22–5.81)
RDW: 17.4 % — AB (ref 11.5–15.5)
WBC: 17.4 10*3/uL — ABNORMAL HIGH (ref 4.0–10.5)

## 2016-01-19 LAB — GLUCOSE, CAPILLARY
GLUCOSE-CAPILLARY: 159 mg/dL — AB (ref 65–99)
GLUCOSE-CAPILLARY: 147 mg/dL — AB (ref 65–99)
Glucose-Capillary: 136 mg/dL — ABNORMAL HIGH (ref 65–99)
Glucose-Capillary: 141 mg/dL — ABNORMAL HIGH (ref 65–99)
Glucose-Capillary: 160 mg/dL — ABNORMAL HIGH (ref 65–99)
Glucose-Capillary: 168 mg/dL — ABNORMAL HIGH (ref 65–99)

## 2016-01-19 LAB — PHOSPHORUS
Phosphorus: 4.3 mg/dL (ref 2.5–4.6)
Phosphorus: 4.4 mg/dL (ref 2.5–4.6)

## 2016-01-19 LAB — MAGNESIUM
MAGNESIUM: 2.6 mg/dL — AB (ref 1.7–2.4)
MAGNESIUM: 2.5 mg/dL — AB (ref 1.7–2.4)

## 2016-01-19 MED ORDER — ACETYLCYSTEINE 20 % IN SOLN
4.0000 mL | Freq: Three times a day (TID) | RESPIRATORY_TRACT | Status: AC
Start: 2016-01-19 — End: 2016-01-22
  Administered 2016-01-19 – 2016-01-21 (×6): 4 mL via RESPIRATORY_TRACT
  Filled 2016-01-19 (×8): qty 4

## 2016-01-19 MED ORDER — SODIUM CHLORIDE 0.9 % IV SOLN: INTRAVENOUS | Status: DC

## 2016-01-19 MED ORDER — SODIUM CHLORIDE 0.9 % IV SOLN
1.0000 g | Freq: Three times a day (TID) | INTRAVENOUS | Status: DC
  Administered 2016-01-19 – 2016-01-22 (×10): 1 g via INTRAVENOUS
  Filled 2016-01-19 (×12): qty 1

## 2016-01-19 MED ORDER — ANTISEPTIC ORAL RINSE SOLUTION (CORINZ)
7.0000 mL | Freq: Four times a day (QID) | OROMUCOSAL | Status: DC
  Administered 2016-01-20 (×2): 7 mL via OROMUCOSAL

## 2016-01-19 MED ORDER — ACETYLCYSTEINE 20 % IN SOLN
4.0000 mL | Freq: Three times a day (TID) | RESPIRATORY_TRACT | Status: DC
  Administered 2016-01-19 (×2): 4 mL via RESPIRATORY_TRACT
  Filled 2016-01-19 (×3): qty 4

## 2016-01-19 MED ORDER — IPRATROPIUM-ALBUTEROL 0.5-2.5 (3) MG/3ML IN SOLN
3.0000 mL | Freq: Four times a day (QID) | RESPIRATORY_TRACT | Status: DC
  Administered 2016-01-19 – 2016-01-22 (×14): 3 mL via RESPIRATORY_TRACT
  Filled 2016-01-19 (×14): qty 3

## 2016-01-19 NOTE — Progress Notes (Signed)
Instructed pt on procedure. Pt is non-verbal at this time d/t intubation. Tomasita Morrow, RN VAST

## 2016-01-19 NOTE — Procedures (Signed)
Central Venous Catheter Insertion Procedure Note Andrew Reynolds [id number] July 04, 1945  Procedure: Insertion of Central Venous Catheter Indications: Drug and/or fluid administration  Procedure Details Consent: Risks of procedure as well as the alternatives and risks of each were explained to the (patient/caregiver).  Consent for procedure obtained. Time Out: Verified patient identification, verified procedure, site/side was marked, verified correct patient position, special equipment/implants available, medications/allergies/relevent history reviewed, required imaging and test results available.  Performed Real time [place] used to ID and cannulate  Maximum sterile technique was used including antiseptics, cap, gloves, gown, hand hygiene, mask and sheet. Skin prep: Chlorhexidine; local anesthetic administered A antimicrobial bonded/coated triple lumen catheter was placed in the right internal jugular vein using the Seldinger technique.  Evaluation Blood flow good Complications: No apparent complications Patient did tolerate procedure well. Chest X-ray ordered to verify placement.  CXR: pending.  Andrew Reynolds 01/19/2016, 12:29 PM  Andrew Reynolds ACNP-BC [facility] Pulmonary/Critical Care Pager # [phone] OR # [phone] if no answer

## 2016-01-19 NOTE — Progress Notes (Signed)
*  PRELIMINARY RESULTS* Vascular Ultrasound  Lower extremity venous duplex: No evidence of DVT or baker's cyst.  Upper extremity venous duplex:  DVT noted in the left brachial vein (surrounding PICC). No DVT noted RUE. No superficial thrombosis bilaterally.  Gave results to [place], RN     Farrel Demark, RDMS, RVT  01/19/2016, 11:29 AM

## 2016-01-19 NOTE — Progress Notes (Signed)
PULMONARY / CRITICAL CARE MEDICINE   Name: Andrew Reynolds MRN: [id number] DOB: Jan 13, 1946    ADMISSION DATE:  01/16/2016 CONSULTATION DATE:  01/16/16  REFERRING MD:  EDP  CHIEF COMPLAINT:  Altered mental status, low blood pressure  HISTORY OF PRESENT ILLNESS:   Andrew Reynolds is a 27M who presented to the ED from [facility] after staff noted changes to his usual level of alertness and drew a blood gas showing hypercarbia. His PMH is significant for atrial fibrillation, previously on Eliquis. He fell in February of this year and developed hematomas at C6-7-8 that resulted in near complete paralysis. He underwent multiple surgeries and was discharged to [facility]. He then developed a pneumonia and required tracheotomy. He has had 12 or 13 UTIs in the last 4 months with multiple courses of antibiotics. He had a PICC line placed just 8/4  by the LTAC. Has IVC filter placed @ [facility]  On arrival to the ED he was hypotensive, hypothermic and minimally responsive. ABG did demonstrate a respiratory acidosis and he was put on the ventilator. Cultures were drawn, he received IVF and was started on vancomycin and zosyn. Initial chemistries showed hypernatremia with Na 150, normal K, BUN 64 and Cr 0.57. LFTs normal. Albumin 1.7. CBC with WBC 23.4 with left shift, Hgb 9.4 with macrocytosis (unknown baseline) and PLT 280. CXR shows L PICC in adequate position, bilateral pulmonary opacities R > L with bilateral effusions R > L. CT head with no acute intracranial abnormality. Sinus inflammation of indefinite acuity.    SUBJECTIVE:  Remains critically ill,  off pressors  On 60%/ +12 Diuresed well with lasix afebrile  VITAL SIGNS: BP 123/67   Pulse 90   Temp 99.6 F (37.6 C) (Oral)   Resp (!) 28   Ht 6' (1.829 m)   Wt 224 lb 10.4 oz (101.9 kg)   SpO2 94%   BMI 30.47 kg/m   HEMODYNAMICS:    VENTILATOR SETTINGS: Vent Mode: PRVC FiO2 (%):  [60 %] 60 % Set Rate:  [28 bmp] 28 bmp Vt Set:  [500 mL]  500 mL PEEP:  [10 cmH20] 10 cmH20 Plateau Pressure:  [23 cmH20-38 cmH20] 37 cmH20  INTAKE / OUTPUT: I/O last 3 completed shifts: In: 2982.5 [I.V.:80; NG/GT:2502.5; IV Piggyback:400] Out: 3220 [Urine:3220]  PHYSICAL EXAMINATION:  General Well nourished, well developed,  on vent via tracheostomy  HEENT No gross abnormalities. Oropharynx clear, mucosa dry. Mallampati II. Good dentition. Trach site with moderate tan secretions.   Pulmonary Coarse breath sounds bilaterally, decreased at bilateral bases on anterior/lateral exam. No ronchi or wheeze. Vent-assisted effort, symmetrical expansion.   Cardiovascular Irregularly irregular rhythm. S1, s2. No m/r/g. Distal pulses palpable.  Abdomen Soft, non-tender, non-distended, positive bowel sounds, no palpable organomegaly or masses. Normoresonant to percussion. PEG site dressing C/D/I.   Musculoskeletal Grossly normal with changes consistent with known paralysis.  Lymphatics No cervical, supraclavicular or axillary adenopathy.   Neurologic Left pupil reactive to light (R eye is glass), . 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      LABS:  BMET  Recent Labs Lab 01/17/16 0022 01/18/16 0409 01/19/16 0500  NA 150* 149* 148*  K 4.2 3.8 3.6  CL 111 111 108  CO2 33* 32 32  BUN 67* 62* 66*  CREATININE 0.63 0.66 0.71  GLUCOSE 127* 75 155*    Electrolytes  Recent Labs Lab 01/17/16 0022 01/18/16 0409 01/18/16 1006 01/18/16 1737 01/19/16  0500  CALCIUM 8.0* 8.3*  --   --  8.1*  MG 2.5*  --  2.6* 2.6* 2.6*  PHOS 5.5*  --  4.3 4.4 4.3    CBC  Recent Labs Lab 01/17/16 0022 01/18/16 0409 01/19/16 0500  WBC 23.7* 21.7* 17.4*  HGB 8.7* 8.1* 9.5*  HCT 32.7* 29.1* 33.3*  PLT 282 280 290    Coag's No results for input(s): APTT, INR in the last 168 hours.  Sepsis Markers  Recent Labs Lab 01/16/16 1832  LATICACIDVEN 1.55    ABG  Recent  Labs Lab 01/16/16 1829  PHART 7.213*  PCO2ART 86.5*  PO2ART 58.0*    Liver Enzymes  Recent Labs Lab 01/16/16 1819  AST 31  ALT 23  ALKPHOS 69  BILITOT 0.5  ALBUMIN 1.7*    Cardiac Enzymes No results for input(s): TROPONINI, PROBNP in the last 168 hours.  Glucose  Recent Labs Lab 01/16/16 2323 01/18/16 1724 01/18/16 1921 01/18/16 2341 01/19/16 0430 01/19/16 0811  GLUCAP 115* 110* 115* 136* 141* 159*    Imaging Dg Chest Port 1 View  Result Date: 01/19/2016 CLINICAL DATA:  Acute hypoxemic respiratory failure, shortness of breath EXAM: PORTABLE CHEST 1 VIEW COMPARISON:  Portable exam 0520 hours compared 01/18/2016 FINDINGS: Tracheostomy tube stable. LEFT arm PICC line tip projects over SVC. Normal heart size mediastinal contours. Diffuse BILATERAL pulmonary infiltrates which could represent infection or edema. Small bibasilar pleural effusions. No pneumothorax. Prior cervicothoracic fusion. IMPRESSION: Persistent diffuse BILATERAL pulmonary infiltrates question edema versus infection. Small bibasilar pleural effusions. No significant interval change. Electronically Signed   By: Ulyses Southward M.D.   On: 01/19/2016 07:22     STUDIES:  Duplex 8/8 >> LUE brachial DVT around PICC  CULTURES: Blood x 2 8/5 >> coag neg staph 2/2 Urine 8/5 >>ESBL klebs 60k Tracheal aspirate 8/5 >>few GPR, GPC >>  ANTIBIOTICS: Vancomycin 8/5 >> Zosyn 8/5 >>8/8 Meropenem 8/8 >>  SIGNIFICANT EVENTS: Trach/PEG 3/28,  C3-5, T3-4 laminectomy 2/27,  dural AV fistula resection and cervical fusion 3/6, C6-7 harware revision 3/19.    LINES/TUBES: LUE PICC 8/4 >> PEG Trach  DISCUSSION:  Septic shock has resolved ARDS range hypoxia  - unexplained, has IVC filter so PE less likely   He appears anasarcic and total body fluid up, with obvious third spacing. His nutritional status may be suboptimal with albumin 1.7.   ASSESSMENT / PLAN:  PULMONARY A: Acute on chronic hypoxemic /  hypercapneic respiratory failure - baseline is reportedly 65% trach collar without hypercapnea - likely multifactorial and related to sepsis, pulmonary edema, pleural effusions, altered mental status and possible pneumonia Chronic trach Chronic Rt effusion -(prior taps in 10/2015 were transudative and attributed to hypoalbuminemia) P:   ARDS protocol for PEEP/FIO2 Pulmonary toilet May need trach change once PEEP down  CARDIOVASCULAR A:   septic shock Chronic atrial fibrillation Weaned off pressors 8/6  P:  Lopressor prn HR < 120   RENAL A:   Hypernatremia - likely secondary to inadequate free water and increased insensible losses in setting of sepsis AKI Cr 0.57 (baseline ~0.2) P:   Free water 250 Q4h  Ct Lasix 40 q 8H Replete lytes daily as needed  GASTROINTESTINAL A:   Protein calorie malnutrition with anasarca and albumin 1.7 P:   Ct TF Famotidine for stress ulcer ppx while on vent  HEMATOLOGIC A:   Leukocytosis with left shift Macrocytic anemia - unknown chronicity Hx DVT s/p IVC filter  - mentioned in note from [facility]  May 2017, neg LE duplex Lt brachial DVT around PICC 8/8 P:  Trend CBC Keep proph lovenox    INFECTIOUS A:   Severe sepsis / septic shock secondary to urinary vs pulmonary source Coag neg staph bacteremia Multiple decubs - sacrum, left heel - WOC following P:   Change to vanc / meropenem Follow cultures Dc PICC, place CVL, foley changed on adm  ENDOCRINE A:  No acute issues   P:   Follow CBG   NEUROLOGIC A:   Altered mental status, likely toxic metabolic encephalopathy secondary to sepsis Quadriplegia 2/2 prior cervical cord compression P:   RASS goal: 0 Remove fentanyl patch Prn fentanyl for analgesia    FAMILY  - Updates: wife and son at bedside 8/6 . family reports patient wishes to remain FULL CODE.  After decompression, they were told it may take 12 months for him to regain any movement and he has just started moving his  hands and feet a bit.   - Inter-disciplinary family meet or Palliative Care meeting due by:  day 7    cc time x 35 m  Cyril Mourning MD. [facility]. [facility] Pager [phone] If no response call [phone]    01/19/2016, 11:38 AM

## 2016-01-19 NOTE — Consult Note (Addendum)
Date of Admission:  01/16/2016  Date of Consult:  01/19/2016  Reason for Consult: Coagulase  negative staphylococcal bacteremia with septic shock Referring Physician: Dr. Elsworth Soho   HPI: Andrew Reynolds is an 70 y.o. male who unfortunately feell in February and developed hematomas at C6-7-8 that resulted in near complete paralysis. He underwent multiple surgeries and was discharged to [facility]. He then developed a pneumonia and required tracheotomy. In May of this year he was evaluated at [facility] for possible ICD placement but at the time had enterococcal bacteremia with enterococcus present on blood culture. He was sent back to the L tach with IV antibiotics although do not see that they performed a transesophageal echocardiogram.  Incidentally is a been on multiple rounds of different antibiotics likely for different real or perceived infections including VAPS and possible urinary tract infections. Prior to transfer to [facility] he had become profoundly hypothermic hypotensive and minimally responsive compared to baseline. In the ER and ABG was done which showed respiratory acidosis.  He had a PICC line placed at this LTAC prior to transfer. He was placed on vancomycin and zosyn and the  blood cultures obtained here at [facility] prior to [facility] starting antibiotics are growing 2/2 + CNS though ID and sensi pending    No past medical history on file.  As above Atrial fibrillation 6 sinus syndrome  Tetraplegia  Chronic respiratory failure on the ventilator  Multiple pressure ulcers  No past surgical history on file.   Not obtainable from the patient at present  Social History:  has no tobacco, alcohol, and drug history on file. Not obtainable from the patient at present  No family history on file. No obtainable at present from patient  Allergies  Allergen Reactions  . Sulfa Antibiotics Rash     Medications: I have reviewed patients current medications as documented in  Epic Anti-infectives    Start     Dose/Rate Route Frequency Ordered Stop   01/19/16 1100  meropenem (MERREM) 1 g in sodium chloride 0.9 % 100 mL IVPB     1 g 200 mL/hr over 30 Minutes Intravenous Every 8 hours 01/19/16 1056     01/17/16 0200  piperacillin-tazobactam (ZOSYN) IVPB 3.375 g  Status:  Discontinued     3.375 g 12.5 mL/hr over 240 Minutes Intravenous Every 8 hours 01/16/16 1906 01/19/16 1034   01/17/16 0200  vancomycin (VANCOCIN) IVPB 1000 mg/200 mL premix  Status:  Discontinued     1,000 mg 200 mL/hr over 60 Minutes Intravenous Every 8 hours 01/16/16 1906 01/18/16 1037   01/16/16 1830  vancomycin (VANCOCIN) 2,000 mg in sodium chloride 0.9 % 500 mL IVPB     2,000 mg 250 mL/hr over 120 Minutes Intravenous  Once 01/16/16 1823 01/16/16 2133   01/16/16 1830  piperacillin-tazobactam (ZOSYN) IVPB 3.375 g  Status:  Discontinued     3.375 g 100 mL/hr over 30 Minutes Intravenous  Once 01/16/16 1823 01/16/16 1823   01/16/16 1815  piperacillin-tazobactam (ZOSYN) IVPB 3.375 g     3.375 g 100 mL/hr over 30 Minutes Intravenous  Once 01/16/16 1811 01/16/16 1917   01/16/16 1815  vancomycin (VANCOCIN) IVPB 1000 mg/200 mL premix  Status:  Discontinued     1,000 mg 200 mL/hr over 60 Minutes Intravenous  Once 01/16/16 1811 01/16/16 1823         ROS:  as in HPI otherwise remainder of 12 point Review of Systems is not obtainable due to patient's confusion  Blood pressure 123/71, pulse 97, temperature 98.1 F (36.7 C), temperature source Axillary, resp. rate (!) 28, height 6' (1.829 m), weight 224 lb 10.4 oz (101.9 kg), SpO2 94 %. General: alert but obtunded HEENT: anicteric sclera,  EOMI, oropharynx clear and without exudate Cardiovascular: tachy  rate, normal r,  no murmur rubs or gallops Pulmonary: rhonchi Gastrointestinal: soft nontender, nondistended, normal bowel sounds, Musculoskeletal: edematous Skin, soft tissue: decubiti not examined Neuro: tetraplegic   Results for orders  placed or performed during the hospital encounter of 01/16/16 (from the past 48 hour(s))  CBC     Status: Abnormal   Collection Time: 01/18/16  4:09 AM  Result Value Ref Range   WBC 21.7 (H) 4.0 - 10.5 K/uL   RBC 3.00 (L) 4.22 - 5.81 MIL/uL   Hemoglobin 8.1 (L) 13.0 - 17.0 g/dL   HCT 29.1 (L) 39.0 - 52.0 %   MCV 97.0 78.0 - 100.0 fL   MCH 27.0 26.0 - 34.0 pg   MCHC 27.8 (L) 30.0 - 36.0 g/dL   RDW 17.7 (H) 11.5 - 15.5 %   Platelets 280 150 - 400 K/uL  Basic metabolic panel     Status: Abnormal   Collection Time: 01/18/16  4:09 AM  Result Value Ref Range   Sodium 149 (H) 135 - 145 mmol/L   Potassium 3.8 3.5 - 5.1 mmol/L   Chloride 111 101 - 111 mmol/L   CO2 32 22 - 32 mmol/L   Glucose, Bld 75 65 - 99 mg/dL   BUN 62 (H) 6 - 20 mg/dL   Creatinine, Ser 0.66 0.61 - 1.24 mg/dL   Calcium 8.3 (L) 8.9 - 10.3 mg/dL   GFR calc non Af Amer >60 >60 mL/min   GFR calc Af Amer >60 >60 mL/min    Comment: (NOTE) The eGFR has been calculated using the CKD EPI equation. This calculation has not been validated in all clinical situations. eGFR's persistently <60 mL/min signify possible Chronic Kidney Disease.    Anion gap 6 5 - 15  TSH     Status: None   Collection Time: 01/18/16  4:10 AM  Result Value Ref Range   TSH 1.813 0.350 - 4.500 uIU/mL  Vancomycin, trough     Status: Abnormal   Collection Time: 01/18/16  9:25 AM  Result Value Ref Range   Vancomycin Tr 40 (HH) 15 - 20 ug/mL    Comment: CRITICAL RESULT CALLED TO, READ BACK BY AND VERIFIED WITH: Ina Homes [id number] WILDERK   Magnesium     Status: Abnormal   Collection Time: 01/18/16 10:06 AM  Result Value Ref Range   Magnesium 2.6 (H) 1.7 - 2.4 mg/dL  Phosphorus     Status: None   Collection Time: 01/18/16 10:06 AM  Result Value Ref Range   Phosphorus 4.3 2.5 - 4.6 mg/dL  Glucose, capillary     Status: Abnormal   Collection Time: 01/18/16  5:24 PM  Result Value Ref Range   Glucose-Capillary 110 (H) 65 - 99 mg/dL   Comment  1 Notify RN    Comment 2 Document in Chart   Magnesium     Status: Abnormal   Collection Time: 01/18/16  5:37 PM  Result Value Ref Range   Magnesium 2.6 (H) 1.7 - 2.4 mg/dL  Phosphorus     Status: None   Collection Time: 01/18/16  5:37 PM  Result Value Ref Range   Phosphorus 4.4 2.5 - 4.6 mg/dL  Glucose, capillary  Status: Abnormal   Collection Time: 01/18/16  7:21 PM  Result Value Ref Range   Glucose-Capillary 115 (H) 65 - 99 mg/dL   Comment 1 Notify RN   Glucose, capillary     Status: Abnormal   Collection Time: 01/18/16 11:41 PM  Result Value Ref Range   Glucose-Capillary 136 (H) 65 - 99 mg/dL   Comment 1 Document in Chart   Glucose, capillary     Status: Abnormal   Collection Time: 01/19/16  4:30 AM  Result Value Ref Range   Glucose-Capillary 141 (H) 65 - 99 mg/dL   Comment 1 Document in Chart   Magnesium     Status: Abnormal   Collection Time: 01/19/16  5:00 AM  Result Value Ref Range   Magnesium 2.6 (H) 1.7 - 2.4 mg/dL  Phosphorus     Status: None   Collection Time: 01/19/16  5:00 AM  Result Value Ref Range   Phosphorus 4.3 2.5 - 4.6 mg/dL  Basic metabolic panel     Status: Abnormal   Collection Time: 01/19/16  5:00 AM  Result Value Ref Range   Sodium 148 (H) 135 - 145 mmol/L   Potassium 3.6 3.5 - 5.1 mmol/L   Chloride 108 101 - 111 mmol/L   CO2 32 22 - 32 mmol/L   Glucose, Bld 155 (H) 65 - 99 mg/dL   BUN 66 (H) 6 - 20 mg/dL   Creatinine, Ser 0.71 0.61 - 1.24 mg/dL   Calcium 8.1 (L) 8.9 - 10.3 mg/dL   GFR calc non Af Amer >60 >60 mL/min   GFR calc Af Amer >60 >60 mL/min    Comment: (NOTE) The eGFR has been calculated using the CKD EPI equation. This calculation has not been validated in all clinical situations. eGFR's persistently <60 mL/min signify possible Chronic Kidney Disease.    Anion gap 8 5 - 15  CBC     Status: Abnormal   Collection Time: 01/19/16  5:00 AM  Result Value Ref Range   WBC 17.4 (H) 4.0 - 10.5 K/uL   RBC 3.51 (L) 4.22 - 5.81  MIL/uL   Hemoglobin 9.5 (L) 13.0 - 17.0 g/dL   HCT 33.3 (L) 39.0 - 52.0 %   MCV 94.9 78.0 - 100.0 fL   MCH 27.1 26.0 - 34.0 pg   MCHC 28.5 (L) 30.0 - 36.0 g/dL   RDW 17.4 (H) 11.5 - 15.5 %   Platelets 290 150 - 400 K/uL  Glucose, capillary     Status: Abnormal   Collection Time: 01/19/16  8:11 AM  Result Value Ref Range   Glucose-Capillary 159 (H) 65 - 99 mg/dL   Comment 1 Notify RN    Comment 2 Document in Chart   Glucose, capillary     Status: Abnormal   Collection Time: 01/19/16 11:48 AM  Result Value Ref Range   Glucose-Capillary 168 (H) 65 - 99 mg/dL   Comment 1 Notify RN    Comment 2 Document in Chart   Glucose, capillary     Status: Abnormal   Collection Time: 01/19/16  3:53 PM  Result Value Ref Range   Glucose-Capillary 160 (H) 65 - 99 mg/dL   Comment 1 Notify RN    Comment 2 Document in Chart    '@BRIEFLABTABLE' (sdes,specrequest,cult,reptstatus)   ) Recent Results (from the past 720 hour(s))  Blood Culture (routine x 2)     Status: Abnormal (Preliminary result)   Collection Time: 01/16/16  6:20 PM  Result Value Ref Range Status  Specimen Description BLOOD LEFT HAND  Final   Special Requests IN PEDIATRIC BOTTLE 2 CC  Final   Culture  Setup Time   Final    GRAM POSITIVE COCCI IN CLUSTERS CRITICAL RESULT CALLED TO, READ BACK BY AND VERIFIED WITH: TO GABOTT(PHARD) BY TCLEVELAND 01/18/2016 AT 4:37AM AEROBIC BOTTLE ONLY    Culture STAPHYLOCOCCUS SPECIES (COAGULASE NEGATIVE) (A)  Final   Report Status PENDING  Incomplete  Blood Culture ID Panel (Reflexed)     Status: None   Collection Time: 01/16/16  6:20 PM  Result Value Ref Range Status   Enterococcus species NOT DETECTED NOT DETECTED Final   Vancomycin resistance NOT DETECTED NOT DETECTED Final   Listeria monocytogenes NOT DETECTED NOT DETECTED Final   Staphylococcus species NOT DETECTED NOT DETECTED Final   Staphylococcus aureus NOT DETECTED NOT DETECTED Final   Methicillin resistance NOT DETECTED NOT DETECTED  Final   Streptococcus species NOT DETECTED NOT DETECTED Final   Streptococcus agalactiae NOT DETECTED NOT DETECTED Final   Streptococcus pneumoniae NOT DETECTED NOT DETECTED Final   Streptococcus pyogenes NOT DETECTED NOT DETECTED Final   Acinetobacter baumannii NOT DETECTED NOT DETECTED Final   Enterobacteriaceae species NOT DETECTED NOT DETECTED Final   Enterobacter cloacae complex NOT DETECTED NOT DETECTED Final   Escherichia coli NOT DETECTED NOT DETECTED Final   Klebsiella oxytoca NOT DETECTED NOT DETECTED Final   Klebsiella pneumoniae NOT DETECTED NOT DETECTED Final   Proteus species NOT DETECTED NOT DETECTED Final   Serratia marcescens NOT DETECTED NOT DETECTED Final   Carbapenem resistance NOT DETECTED NOT DETECTED Final   Haemophilus influenzae NOT DETECTED NOT DETECTED Final   Neisseria meningitidis NOT DETECTED NOT DETECTED Final   Pseudomonas aeruginosa NOT DETECTED NOT DETECTED Final   Candida albicans NOT DETECTED NOT DETECTED Final   Candida glabrata NOT DETECTED NOT DETECTED Final   Candida krusei NOT DETECTED NOT DETECTED Final   Candida parapsilosis NOT DETECTED NOT DETECTED Final   Candida tropicalis NOT DETECTED NOT DETECTED Final  Blood Culture (routine x 2)     Status: Abnormal (Preliminary result)   Collection Time: 01/16/16  6:25 PM  Result Value Ref Range Status   Specimen Description BLOOD LEFT WRIST  Final   Special Requests IN PEDIATRIC BOTTLE 3 CC  Final   Culture  Setup Time   Final    GRAM POSITIVE COCCI IN CLUSTERS AEROBIC BOTTLE ONLY CRITICAL RESULT CALLED TO, READ BACK BY AND VERIFIED WITH: C ABBOTT,PHARMD AT 0437 01/18/16 BY T CLEVELAND    Culture STAPHYLOCOCCUS SPECIES (COAGULASE NEGATIVE) (A)  Final   Report Status PENDING  Incomplete  Urine culture     Status: Abnormal   Collection Time: 01/16/16  8:25 PM  Result Value Ref Range Status   Specimen Description URINE, RANDOM  Final   Special Requests NONE  Final   Culture (A)  Final    60,000  COLONIES/mL KLEBSIELLA PNEUMONIAE Confirmed Extended Spectrum Beta-Lactamase Producer (ESBL)    Report Status 01/19/2016 FINAL  Final   Organism ID, Bacteria KLEBSIELLA PNEUMONIAE (A)  Final      Susceptibility   Klebsiella pneumoniae - MIC*    AMPICILLIN >=32 RESISTANT Resistant     CEFAZOLIN >=64 RESISTANT Resistant     CEFTRIAXONE 8 RESISTANT Resistant     CIPROFLOXACIN 2 INTERMEDIATE Intermediate     GENTAMICIN <=1 SENSITIVE Sensitive     IMIPENEM <=0.25 SENSITIVE Sensitive     NITROFURANTOIN 128 RESISTANT Resistant     TRIMETH/SULFA <=20 SENSITIVE Sensitive  AMPICILLIN/SULBACTAM >=32 RESISTANT Resistant     PIP/TAZO 32 INTERMEDIATE Intermediate     Extended ESBL POSITIVE Resistant     * 60,000 COLONIES/mL KLEBSIELLA PNEUMONIAE  MRSA PCR Screening     Status: None   Collection Time: 01/16/16 11:29 PM  Result Value Ref Range Status   MRSA by PCR NEGATIVE NEGATIVE Final    Comment:        The GeneXpert MRSA Assay (FDA approved for NASAL specimens only), is one component of a comprehensive MRSA colonization surveillance program. It is not intended to diagnose MRSA infection nor to guide or monitor treatment for MRSA infections.   Culture, respiratory (NON-Expectorated)     Status: None (Preliminary result)   Collection Time: 01/17/16  1:32 AM  Result Value Ref Range Status   Specimen Description TRACHEAL ASPIRATE  Final   Special Requests Normal  Final   Gram Stain   Final    ABUNDANT WBC PRESENT, PREDOMINANTLY PMN NO SQUAMOUS EPITHELIAL CELLS SEEN FEW GRAM POSITIVE RODS FEW GRAM POSITIVE COCCI IN PAIRS    Culture   Final    ABUNDANT SERRATIA MARCESCENS ABUNDANT PSEUDOMONAS AERUGINOSA    Report Status PENDING  Incomplete   Organism ID, Bacteria SERRATIA MARCESCENS  Final      Susceptibility   Serratia marcescens - MIC*    CEFAZOLIN >=64 RESISTANT Resistant     CEFEPIME <=1 SENSITIVE Sensitive     CEFTAZIDIME <=1 SENSITIVE Sensitive     CEFTRIAXONE <=1  SENSITIVE Sensitive     CIPROFLOXACIN <=0.25 SENSITIVE Sensitive     GENTAMICIN <=1 SENSITIVE Sensitive     TRIMETH/SULFA <=20 SENSITIVE Sensitive     * ABUNDANT SERRATIA MARCESCENS     Impression/Recommendation  Active Problems:   Septic shock (HCC)   Pressure ulcer   Encounter for central line placement   Andrew Reynolds is a 70 y.o. male with mx medical problems and catastrophic bleed into spine with tetraplegia, bed bound, with decubitus ulcers, with VDRF and now that it was septic shock and hypothermia due to coagulase-negative staphylococcal bacteremia  #1 coagulase negative Staphylococcus bacteremia: Micro-lab will look for identification of the organism to see if it is a Staphylococcus lugdunensis and to check sensis. If it is lugdunensis we MOSt definitely will want to get the PICC OUT  We would also want either to pursue an empiric course of therapy for endocarditis in this situation versus look for endocarditis with a TEE.  Certainly I think will not do much harm by checking a TTE.  #2 VAP: he has Serratia and Pseudomonas growing from resp culture: I am not looking forward to seeing the sensis on the pseudmonad. These may not be pathogens either but colonizers, ok to treat for now with shortest possible course  He is currently on carbapenem but IF possible would narrow away from this  #3 ESBL in the urine: unclear significance of this organism. The CFU is low here at [facility]. Assuredly it is the Coag NEG in HIS BLOOD that is causing him septic not this organism in the urine. Would not give protracted therapy for this organims which is not likely a pathogen   #3 goals of care this patient seems to have extremely poor quality of life and I don't understand why he is continuing to see received such aggressive care 5 months after such a horrible event. This is prolonging a life without much that seems to be left in terms of quality and at a great cost.   01/19/2016,  5:15  PM   Thank you so much for this interesting consult  [place] [facility] [place] [phone] (pager) [phone] (office) 01/19/2016, 5:15 PM  Rhina Br[place] 01/19/2016, 5:15 PM

## 2016-01-19 NOTE — Progress Notes (Signed)
Pharmacy Antibiotic Note  Andrew Reynolds is a 70 y.o. male admitted on 01/16/2016 with pneumonia and UTI.  Pharmacy has been consulted for vanc/meropenem  dosing.  Plan: Meropenem 1 gm Q 8 hours- ESBL bacteruria - Patient is quadriplegic so renal function is about 50% of estimated creatinine clearance. Continue to hold Vanc- will check random level in AM   Height: 6' (182.9 cm) Weight: 224 lb 10.4 oz (101.9 kg) IBW/kg (Calculated) : 77.6  Temp (24hrs), Avg:98.7 F (37.1 C), Min:97.8 F (36.6 C), Max:99.6 F (37.6 C)   Recent Labs Lab 01/16/16 1819 01/16/16 1832 01/17/16 0022 01/18/16 0409 01/18/16 0925 01/19/16 0500  WBC 23.4*  --  23.7* 21.7*  --  17.4*  CREATININE 0.57*  --  0.63 0.66  --  0.71  LATICACIDVEN  --  1.55  --   --   --   --   VANCOTROUGH  --   --   --   --  40*  --     Estimated Creatinine Clearance: 106.1 mL/min (by C-G formula based on SCr of 0.8 mg/dL).    Allergies  Allergen Reactions  . Sulfa Antibiotics Rash    Antimicrobials this admission: Zosyn 8/5>> 8/8 Vanc 8/5 >>  Meropenem 8/8>>  8/7 VT- 40  Dose adjustments this admission: Vanc held 8/7 due to trough of 40  Microbiology results: 8/5 BCx: Coag neg staph  8/5  UCx: 60,000 ESBL Klebsiella  8/5 MRSA PCR: Negative 8/6 Resp Cx: Serratia marsecens (Cefazolin-R)  Thank you for allowing pharmacy to be a part of this patient's care.  Delila Spence D Pharmacy Resident  [phone] 01/19/2016 12:12 PM

## 2016-01-20 ENCOUNTER — Inpatient Hospital Stay (HOSPITAL_COMMUNITY): Payer: Medicare Other

## 2016-01-20 DIAGNOSIS — R509 Fever, unspecified: Secondary | ICD-10-CM

## 2016-01-20 DIAGNOSIS — G8250 Quadriplegia, unspecified: Secondary | ICD-10-CM

## 2016-01-20 DIAGNOSIS — Z930 Tracheostomy status: Secondary | ICD-10-CM

## 2016-01-20 DIAGNOSIS — J95851 Ventilator associated pneumonia: Secondary | ICD-10-CM

## 2016-01-20 DIAGNOSIS — R7881 Bacteremia: Secondary | ICD-10-CM

## 2016-01-20 LAB — GLUCOSE, CAPILLARY
GLUCOSE-CAPILLARY: 137 mg/dL — AB (ref 65–99)
GLUCOSE-CAPILLARY: 142 mg/dL — AB (ref 65–99)
GLUCOSE-CAPILLARY: 142 mg/dL — AB (ref 65–99)
GLUCOSE-CAPILLARY: 145 mg/dL — AB (ref 65–99)
GLUCOSE-CAPILLARY: 155 mg/dL — AB (ref 65–99)
Glucose-Capillary: 139 mg/dL — ABNORMAL HIGH (ref 65–99)

## 2016-01-20 LAB — CULTURE, RESPIRATORY W GRAM STAIN

## 2016-01-20 LAB — CULTURE, RESPIRATORY: SPECIAL REQUESTS: NORMAL

## 2016-01-20 LAB — BASIC METABOLIC PANEL
ANION GAP: 8 (ref 5–15)
BUN: 59 mg/dL — ABNORMAL HIGH (ref 6–20)
CHLORIDE: 108 mmol/L (ref 101–111)
CO2: 36 mmol/L — ABNORMAL HIGH (ref 22–32)
Calcium: 8.3 mg/dL — ABNORMAL LOW (ref 8.9–10.3)
Creatinine, Ser: 0.55 mg/dL — ABNORMAL LOW (ref 0.61–1.24)
GFR calc Af Amer: >60 mL/min (ref >60)
GFR calc non Af Amer: >60 mL/min (ref >60)
Glucose, Bld: 146 mg/dL — ABNORMAL HIGH (ref 65–99)
POTASSIUM: 3.2 mmol/L — AB (ref 3.5–5.1)
SODIUM: 152 mmol/L — AB (ref 135–145)

## 2016-01-20 LAB — CBC
HEMATOCRIT: 29.7 % — AB (ref 39.0–52.0)
HEMOGLOBIN: 8.3 g/dL — AB (ref 13.0–17.0)
MCH: 26.9 pg (ref 26.0–34.0)
MCHC: 27.9 g/dL — AB (ref 30.0–36.0)
MCV: 96.4 fL (ref 78.0–100.0)
Platelets: 261 10*3/uL (ref 150–400)
RBC: 3.08 MIL/uL — AB (ref 4.22–5.81)
RDW: 17.5 % — ABNORMAL HIGH (ref 11.5–15.5)
WBC: 20.8 10*3/uL — ABNORMAL HIGH (ref 4.0–10.5)

## 2016-01-20 LAB — VANCOMYCIN, TROUGH: VANCOMYCIN TR: 19 ug/mL (ref 15–20)

## 2016-01-20 LAB — ECHOCARDIOGRAM COMPLETE
Height: 72 in
Weight: 3545 oz

## 2016-01-20 MED ORDER — MIDAZOLAM HCL 2 MG/2ML IJ SOLN
INTRAMUSCULAR | Status: AC
Start: 2016-01-20 — End: 2016-01-20
  Administered 2016-01-20: 2 mg
  Filled 2016-01-20: qty 2

## 2016-01-20 MED ORDER — POTASSIUM CHLORIDE 20 MEQ/15ML (10%) PO SOLN
40.0000 meq | Freq: Once | ORAL | Status: AC
  Administered 2016-01-20: 40 meq via ORAL
  Filled 2016-01-20: qty 30

## 2016-01-20 MED ORDER — CETYLPYRIDINIUM CHLORIDE 0.05 % MT LIQD
7.0000 mL | Freq: Two times a day (BID) | OROMUCOSAL | Status: DC
  Administered 2016-01-20 – 2016-01-22 (×6): 7 mL via OROMUCOSAL

## 2016-01-20 MED ORDER — VANCOMYCIN HCL IN DEXTROSE 750-5 MG/150ML-% IV SOLN
750.0000 mg | Freq: Two times a day (BID) | INTRAVENOUS | Status: DC
  Administered 2016-01-20 – 2016-01-22 (×5): 750 mg via INTRAVENOUS
  Filled 2016-01-20 (×5): qty 150

## 2016-01-20 MED ORDER — POTASSIUM CHLORIDE 20 MEQ/15ML (10%) PO SOLN
30.0000 meq | ORAL | Status: AC
  Administered 2016-01-20 (×2): 30 meq
  Filled 2016-01-20 (×2): qty 30

## 2016-01-20 MED ORDER — CHLORHEXIDINE GLUCONATE 0.12 % MT SOLN
15.0000 mL | Freq: Two times a day (BID) | OROMUCOSAL | Status: DC
  Administered 2016-01-20 – 2016-01-22 (×5): 15 mL via OROMUCOSAL

## 2016-01-20 MED ORDER — DEXTROSE 5 % IV SOLN
INTRAVENOUS | Status: DC
  Administered 2016-01-20: 10:00:00 via INTRAVENOUS
  Administered 2016-01-21 – 2016-01-22 (×2): 50 mL via INTRAVENOUS

## 2016-01-20 NOTE — Progress Notes (Signed)
*  PRELIMINARY RESULTS* Echocardiogram 2D Echocardiogram has been performed.  Jeryl Columbia 01/20/2016, 12:00 PM

## 2016-01-20 NOTE — Progress Notes (Signed)
[facility] ADULT ICU REPLACEMENT PROTOCOL FOR AM LAB REPLACEMENT ONLY  The patient does apply for the [facility] Adult ICU Electrolyte Replacment Protocol based on the criteria listed below:   1. Is GFR >/= 40 ml/min? Yes.    Patient's GFR today is >60 2. Is urine output >/= 0.5 ml/kg/hr for the last 6 hours? Yes.   Patient's UOP is 0.75 ml/kg/hr 3. Is BUN < 60 mg/dL? Yes.    Patient's BUN today is 59 4. Abnormal electrolyte(s): Potassium 3.2 5. Ordered repletion with: Potassium per protocol 6. If a panic level lab has been reported, has the CCM MD in charge been notified? No..   Physician:    Thomasenia Bottoms 01/20/2016 5:27 AM

## 2016-01-20 NOTE — Procedures (Signed)
Bronchoscopy Procedure Note Aerion Bagdasarian [id number] 05/20/1946  Procedure: Bronchoscopy Indications: Diagnostic evaluation of the airways, Obtain specimens for culture and/or other diagnostic studies and Remove secretions  Procedure Details Consent: Risks of procedure as well as the alternatives and risks of each were explained to the (patient/caregiver).  Consent for procedure obtained. Time Out: Verified patient identification, verified procedure, site/side was marked, verified correct patient position, special equipment/implants available, medications/allergies/relevent history reviewed, required imaging and test results available.  Performed  In preparation for procedure, patient was given 100% FiO2 and bronchoscope lubricated. Sedation: Benzodiazepines versed 1 mg , fent 100 mcg  Airway entered and the following bronchi were examined: Bronchi.   Procedures performed: BAL performed, thick mucopurulent secretions were suctioned out from both lower lobes. No evidence of tracheomalacia, tracheostomy noted with distal end of tube about 10 cm from the carina Bronchoscope removed.  , Patient placed back on 100% FiO2 at conclusion of procedure.    Evaluation Hemodynamic Status: BP stable throughout; O2 sats: stable throughout Patient's Current Condition: stable Specimens:  Sent purulent fluid Complications: No apparent complications Patient did tolerate procedure well.   Lamondre Wesche V. 01/20/2016

## 2016-01-20 NOTE — Progress Notes (Signed)
Subjective:  Pt confused  Antibiotics:  Anti-infectives    Start     Dose/Rate Route Frequency Ordered Stop   01/20/16 1300  vancomycin (VANCOCIN) IVPB 750 mg/150 ml premix     750 mg 150 mL/hr over 60 Minutes Intravenous Every 12 hours 01/20/16 0918     01/19/16 1100  meropenem (MERREM) 1 g in sodium chloride 0.9 % 100 mL IVPB     1 g 200 mL/hr over 30 Minutes Intravenous Every 8 hours 01/19/16 1056     01/17/16 0200  piperacillin-tazobactam (ZOSYN) IVPB 3.375 g  Status:  Discontinued     3.375 g 12.5 mL/hr over 240 Minutes Intravenous Every 8 hours 01/16/16 1906 01/19/16 1034   01/17/16 0200  vancomycin (VANCOCIN) IVPB 1000 mg/200 mL premix  Status:  Discontinued     1,000 mg 200 mL/hr over 60 Minutes Intravenous Every 8 hours 01/16/16 1906 01/18/16 1037   01/16/16 1830  vancomycin (VANCOCIN) 2,000 mg in sodium chloride 0.9 % 500 mL IVPB     2,000 mg 250 mL/hr over 120 Minutes Intravenous  Once 01/16/16 1823 01/16/16 2133   01/16/16 1830  piperacillin-tazobactam (ZOSYN) IVPB 3.375 g  Status:  Discontinued     3.375 g 100 mL/hr over 30 Minutes Intravenous  Once 01/16/16 1823 01/16/16 1823   01/16/16 1815  piperacillin-tazobactam (ZOSYN) IVPB 3.375 g     3.375 g 100 mL/hr over 30 Minutes Intravenous  Once 01/16/16 1811 01/16/16 1917   01/16/16 1815  vancomycin (VANCOCIN) IVPB 1000 mg/200 mL premix  Status:  Discontinued     1,000 mg 200 mL/hr over 60 Minutes Intravenous  Once 01/16/16 1811 01/16/16 1823      Medications: Scheduled Meds: . acetylcysteine  4 mL Nebulization TID  . antiseptic oral rinse  7 mL Mouth Rinse QID  . chlorhexidine  15 mL Mouth/Throat BID  . collagenase   Topical Daily  . enoxaparin (LOVENOX) injection  40 mg Subcutaneous Q24H  . famotidine  20 mg Per Tube BID  . feeding supplement (PRO-STAT SUGAR FREE 64)  30 mL Per Tube Daily  . free water  250 mL Per Tube Q4H  . furosemide  40 mg Intravenous Q8H  . ipratropium-albuterol  3 mL  Nebulization Q6H  . meropenem (MERREM) IV  1 g Intravenous Q8H  . potassium chloride  40 mEq Oral Once  . vancomycin  750 mg Intravenous Q12H   Continuous Infusions: . dextrose 50 mL/hr at 01/20/16 0937  . feeding supplement (VITAL AF 1.2 CAL) 1,000 mL (01/20/16 0904)   PRN Meds:.sodium chloride, fentaNYL (SUBLIMAZE) injection, fentaNYL (SUBLIMAZE) injection    Objective: Weight change: -3 lb 1.4 oz (-1.4 kg)  Intake/Output Summary (Last 24 hours) at 01/20/16 1032 Last data filed at 01/20/16 0800  Gross per 24 hour  Intake             2585 ml  Output             2510 ml  Net               75 ml   Blood pressure 131/74, pulse (!) 107, temperature 97.8 F (36.6 C), temperature source Oral, resp. rate (!) 28, height 6' (1.829 m), weight 221 lb 9 oz (100.5 kg), SpO2 92 %. Temp:  [97.5 F (36.4 C)-98.8 F (37.1 C)] 97.8 F (36.6 C) (08/09 0741) Pulse Rate:  [88-109] 107 (08/09 1000) Resp:  [17-28] 28 (08/09 1000) BP: (112-135)/(61-81) 131/74 (  08/09 1000) SpO2:  [88 %-97 %] 92 % (08/09 1000) FiO2 (%):  [60 %] 60 % (08/09 0747) Weight:  [221 lb 9 oz (100.5 kg)] 221 lb 9 oz (100.5 kg) (08/09 0444)  Physical Exam: General: alert but obtunded HEENT: anicteric sclera,  EOMI, oropharynx clear and without exudate, tracheostomy in place Cardiovascular: tachy  rate, normal r,  no murmur rubs or gallops Pulmonary: rhonchi Gastrointestinal: soft nontender, nondistended, normal bowel sounds, Musculoskeletal: edematous Skin, soft tissue: decubiti not examined Neuro: tetraplegic  CBC:  CBC Latest Ref Rng & Units 01/20/2016 01/19/2016 01/18/2016  WBC 4.0 - 10.5 K/uL 20.8(H) 17.4(H) 21.7(H)  Hemoglobin 13.0 - 17.0 g/dL 8.3(L) 9.5(L) 8.1(L)  Hematocrit 39.0 - 52.0 % 29.7(L) 33.3(L) 29.1(L)  Platelets 150 - 400 K/uL 261 290 280    BMET  Recent Labs  01/19/16 0500 01/20/16 0458  NA 148* 152*  K 3.6 3.2*  CL 108 108  CO2 32 36*  GLUCOSE 155* 146*  BUN 66* 59*  CREATININE 0.71 0.55*   CALCIUM 8.1* 8.3*     Liver Panel  No results for input(s): PROT, ALBUMIN, AST, ALT, ALKPHOS, BILITOT, BILIDIR, IBILI in the last 72 hours.     Sedimentation Rate No results for input(s): ESRSEDRATE in the last 72 hours. C-Reactive Protein No results for input(s): CRP in the last 72 hours.  Micro Results: Recent Results (from the past 720 hour(s))  Blood Culture (routine x 2)     Status: Abnormal (Preliminary result)   Collection Time: 01/16/16  6:20 PM  Result Value Ref Range Status   Specimen Description BLOOD LEFT HAND  Final   Special Requests IN PEDIATRIC BOTTLE 2 CC  Final   Culture  Setup Time   Final    GRAM POSITIVE COCCI IN CLUSTERS CRITICAL RESULT CALLED TO, READ BACK BY AND VERIFIED WITH: TO GABOTT(PHARD) BY TCLEVELAND 01/18/2016 AT 4:37AM AEROBIC BOTTLE ONLY    Culture (A)  Final    STAPHYLOCOCCUS SPECIES (COAGULASE NEGATIVE) REPEATING TO CONFIRM ID    Report Status PENDING  Incomplete  Blood Culture ID Panel (Reflexed)     Status: None   Collection Time: 01/16/16  6:20 PM  Result Value Ref Range Status   Enterococcus species NOT DETECTED NOT DETECTED Final   Vancomycin resistance NOT DETECTED NOT DETECTED Final   Listeria monocytogenes NOT DETECTED NOT DETECTED Final   Staphylococcus species NOT DETECTED NOT DETECTED Final   Staphylococcus aureus NOT DETECTED NOT DETECTED Final   Methicillin resistance NOT DETECTED NOT DETECTED Final   Streptococcus species NOT DETECTED NOT DETECTED Final   Streptococcus agalactiae NOT DETECTED NOT DETECTED Final   Streptococcus pneumoniae NOT DETECTED NOT DETECTED Final   Streptococcus pyogenes NOT DETECTED NOT DETECTED Final   Acinetobacter baumannii NOT DETECTED NOT DETECTED Final   Enterobacteriaceae species NOT DETECTED NOT DETECTED Final   Enterobacter cloacae complex NOT DETECTED NOT DETECTED Final   Escherichia coli NOT DETECTED NOT DETECTED Final   Klebsiella oxytoca NOT DETECTED NOT DETECTED Final    Klebsiella pneumoniae NOT DETECTED NOT DETECTED Final   Proteus species NOT DETECTED NOT DETECTED Final   Serratia marcescens NOT DETECTED NOT DETECTED Final   Carbapenem resistance NOT DETECTED NOT DETECTED Final   Haemophilus influenzae NOT DETECTED NOT DETECTED Final   Neisseria meningitidis NOT DETECTED NOT DETECTED Final   Pseudomonas aeruginosa NOT DETECTED NOT DETECTED Final   Candida albicans NOT DETECTED NOT DETECTED Final   Candida glabrata NOT DETECTED NOT DETECTED Final   Candida  krusei NOT DETECTED NOT DETECTED Final   Candida parapsilosis NOT DETECTED NOT DETECTED Final   Candida tropicalis NOT DETECTED NOT DETECTED Final  Blood Culture (routine x 2)     Status: Abnormal (Preliminary result)   Collection Time: 01/16/16  6:25 PM  Result Value Ref Range Status   Specimen Description BLOOD LEFT WRIST  Final   Special Requests IN PEDIATRIC BOTTLE 3 CC  Final   Culture  Setup Time   Final    GRAM POSITIVE COCCI IN CLUSTERS AEROBIC BOTTLE ONLY CRITICAL RESULT CALLED TO, READ BACK BY AND VERIFIED WITH: C ABBOTT,PHARMD AT 0437 01/18/16 BY T C[place]AND    Culture STAPHYLOCOCCUS SPECIES (COAGULASE NEGATIVE) (A)  Final   Report Status PENDING  Incomplete  Urine culture     Status: Abnormal   Collection Time: 01/16/16  8:25 PM  Result Value Ref Range Status   Specimen Description URINE, RANDOM  Final   Special Requests NONE  Final   Culture (A)  Final    60,000 COLONIES/mL KLEBSIELLA PNEUMONIAE Confirmed Extended Spectrum Beta-Lactamase Producer (ESBL)    Report Status 01/19/2016 FINAL  Final   Organism ID, Bacteria KLEBSIELLA PNEUMONIAE (A)  Final      Susceptibility   Klebsiella pneumoniae - MIC*    AMPICILLIN >=32 RESISTANT Resistant     CEFAZOLIN >=64 RESISTANT Resistant     CEFTRIAXONE 8 RESISTANT Resistant     CIPROFLOXACIN 2 INTERMEDIATE Intermediate     GENTAMICIN <=1 SENSITIVE Sensitive     IMIPENEM <=0.25 SENSITIVE Sensitive     NITROFURANTOIN 128 RESISTANT  Resistant     TRIMETH/SULFA <=20 SENSITIVE Sensitive     AMPICILLIN/SULBACTAM >=32 RESISTANT Resistant     PIP/TAZO 32 INTERMEDIATE Intermediate     Extended ESBL POSITIVE Resistant     * 60,000 COLONIES/mL KLEBSIELLA PNEUMONIAE  MRSA PCR Screening     Status: None   Collection Time: 01/16/16 11:29 PM  Result Value Ref Range Status   MRSA by PCR NEGATIVE NEGATIVE Final    Comment:        The GeneXpert MRSA Assay (FDA approved for NASAL specimens only), is one component of a comprehensive MRSA colonization surveillance program. It is not intended to diagnose MRSA infection nor to guide or monitor treatment for MRSA infections.   Culture, respiratory (NON-Expectorated)     Status: None (Preliminary result)   Collection Time: 01/17/16  1:32 AM  Result Value Ref Range Status   Specimen Description TRACHEAL ASPIRATE  Final   Special Requests Normal  Final   Gram Stain   Final    ABUNDANT WBC PRESENT, PREDOMINANTLY PMN NO SQUAMOUS EPITHELIAL CELLS SEEN FEW GRAM POSITIVE RODS FEW GRAM POSITIVE COCCI IN PAIRS    Culture   Final    ABUNDANT SERRATIA MARCESCENS ABUNDANT PSEUDOMONAS AERUGINOSA    Report Status PENDING  Incomplete   Organism ID, Bacteria SERRATIA MARCESCENS  Final      Susceptibility   Serratia marcescens - MIC*    CEFAZOLIN >=64 RESISTANT Resistant     CEFEPIME <=1 SENSITIVE Sensitive     CEFTAZIDIME <=1 SENSITIVE Sensitive     CEFTRIAXONE <=1 SENSITIVE Sensitive     CIPROFLOXACIN <=0.25 SENSITIVE Sensitive     GENTAMICIN <=1 SENSITIVE Sensitive     TRIMETH/SULFA <=20 SENSITIVE Sensitive     * ABUNDANT SERRATIA MARCESCENS    Studies/Results: Dg Chest Port 1 View  Result Date: 01/20/2016 CLINICAL DATA:  Respiratory failure. EXAM: PORTABLE CHEST 1 VIEW COMPARISON:  01/19/2016. FINDINGS: Interval  removal of left PICC line. Right IJ line stable position . Tracheostomy tube in stable position. Heart size normal. Persistent diffuse bilateral pulmonary infiltrates  without improvement. Small bilateral pleural effusions again noted. No pneumothorax. IMPRESSION: 1. Interval removal of left PICC line. Interim placement of right IJ line, its tip is at the cavoatrial junction. 2. Persistent by diffuse pulmonary infiltrates. Small bilateral pleural effusions. No interim change from prior exam. Electronically Signed   By: Maisie Fus  Register   On: 01/20/2016 06:54   Dg Chest Port 1 View  Result Date: 01/19/2016 CLINICAL DATA:  Central line placement evaluation. EXAM: PORTABLE CHEST 1 VIEW COMPARISON:  January 19, 2016 FINDINGS: The left PICC line is stable. A new right IJ terminates in the central SVC. No pneumothorax. Layering bilateral effusions with underlying opacities. Diffuse bilateral pulmonary opacities are also similar in the interval. IMPRESSION: Appropriate placement of right IJ with no pneumothorax. Stable bilateral pleural effusions and pulmonary opacities. Electronically Signed   By: Gerome Sam III M.D   On: 01/19/2016 13:23   Dg Chest Port 1 View  Result Date: 01/19/2016 CLINICAL DATA:  Acute hypoxemic respiratory failure, shortness of breath EXAM: PORTABLE CHEST 1 VIEW COMPARISON:  Portable exam 0520 hours compared 01/18/2016 FINDINGS: Tracheostomy tube stable. LEFT arm PICC line tip projects over SVC. Normal heart size mediastinal contours. Diffuse BILATERAL pulmonary infiltrates which could represent infection or edema. Small bibasilar pleural effusions. No pneumothorax. Prior cervicothoracic fusion. IMPRESSION: Persistent diffuse BILATERAL pulmonary infiltrates question edema versus infection. Small bibasilar pleural effusions. No significant interval change. Electronically Signed   By: Ulyses Southward M.D.   On: 01/19/2016 07:22      Assessment/Plan:  INTERVAL HISTORY:   Prior PICC removed, new PICC placed   Active Problems:   Septic shock (HCC)   Pressure ulcer   Encounter for central line placement   Acute hypoxemic respiratory failure (HCC)    Glasgow coma scale total score 3-8 (HCC)   Coagulase negative Staphylococcus bacteremia   Pneumonia of left upper lobe due to Pseudomonas species (HCC)   Serratia infection   ESBL E. coli carrier    Andrew Reynolds is a 70 y.o. male with with mx medical problems and catastrophic bleed into spine with tetraplegia, bed bound, with decubitus ulcers, with VDRF and now that it was septic shock and hypothermia due to coagulase-negative staphylococcal bacteremia  #1 Coagulase negative Staphylococcus bacteremia: Micro-lab will look for identification of the organism to see if it is a Staphylococcus lugdunensis and to check sensis. PICC has been replaced.  If it is lugdunensis we would need to strongly consider treating for endocarditis unless we would rule this out with TEE (assuming continued aggressive rx being pursued    #2 VAP: he has Serratia and Pseudomonas growing from resp culture: I am not looking forward to seeing the sensis on the pseudmonad. These may not be pathogens either but colonizers, ok to treat for now with shortest possible course  He is currently on carbapenem but IF possible would narrow away from this  #3 ESBL in the urine: unclear significance of this organism.   #3 goals of care this patient : strongly encourage Palliative Care Consult   LOS: 4 days   Acey Lav 01/20/2016, 10:32 AM

## 2016-01-20 NOTE — Progress Notes (Signed)
Pharmacy Antibiotic Note- Follow- Up Note   Andrew Reynolds is a 70 y.o. male admitted on 01/16/2016 with PNA/UTI.  Pharmacy has been consulted for vanc/zosyn dosing.   Plan: Re-start Vancomycin 750 mg Q 12 H. Check VT at [facility] ~ 8/11 Monitor renal fxn, clinical s/sx VT goal- 15-20  Height: 6' (182.9 cm) Weight: 221 lb 9 oz (100.5 kg) IBW/kg (Calculated) : 77.6  Temp (24hrs), Avg:98.1 F (36.7 C), Min:97.5 F (36.4 C), Max:98.8 F (37.1 C)   Recent Labs Lab 01/16/16 1819 01/16/16 1832 01/17/16 0022 01/18/16 0409 01/18/16 0925 01/19/16 0500 01/20/16 0450 01/20/16 0458  WBC 23.4*  --  23.7* 21.7*  --  17.4*  --  20.8*  CREATININE 0.57*  --  0.63 0.66  --  0.71  --  0.55*  LATICACIDVEN  --  1.55  --   --   --   --   --   --   VANCOTROUGH  --   --   --   --  40*  --  19  --     Estimated Creatinine Clearance: 105.5 mL/min (by C-G formula based on SCr of 0.8 mg/dL).    Allergies  Allergen Reactions  . Sulfa Antibiotics Rash    Antimicrobials this admission: Zosyn 8/5 >> 8/8 Vanc 8/5 >>  Meropenem 8/8>>  Dose Adjsutments: 8/7 VT- 40- held Vancomycin for 2 days 8/9 Random-19   Microbiology results: 8/5 BCx: 2/2 Coag neg staph 8/5 UCx: 60,000 ESBL Klebsiella  8/5 MRSA PCR: Neg 8/6 RCx: Serratia (Cefazolin-R), Pseudomonas   Thank you for allowing pharmacy to be a part of this patient's care.  Delila Spence D Pharmacy Resident [phone] 01/20/2016 11:19 AM

## 2016-01-20 NOTE — Progress Notes (Signed)
RT note-Assisted with Bronchoscopy with Dr. Vassie Loll, placed on 100% fio2 prior to procedure, sample sent for culture. Patient remains on current settings prior to procedure.

## 2016-01-20 NOTE — Progress Notes (Signed)
PULMONARY / CRITICAL CARE MEDICINE   Name: Andrew Reynolds MRN: [id number] DOB: [birth year]    ADMISSION DATE:  01/16/2016 CONSULTATION DATE:  01/16/16  REFERRING MD:  EDP  CHIEF COMPLAINT:  Altered mental status, low blood pressure  HISTORY OF PRESENT ILLNESS:   Andrew Reynolds is a 31M who presented to the ED from [facility] after staff noted changes to his usual level of alertness and drew a blood gas showing hypercarbia. His PMH is significant for atrial fibrillation, previously on Eliquis. He fell in February of this year and developed hematomas at C6-7-8 that resulted in near complete paralysis. He underwent multiple surgeries and was discharged to [facility]. He then developed a pneumonia and required tracheotomy. He has had 12 or 13 UTIs in the last 4 months with multiple courses of antibiotics. He had a PICC line placed just 8/4  by the LTAC. Has IVC filter placed @ [facility]  On arrival to the ED he was hypotensive, hypothermic and minimally responsive. ABG did demonstrate a respiratory acidosis and he was put on the ventilator. Cultures were drawn, he received IVF and was started on vancomycin and zosyn. Initial chemistries showed hypernatremia with Na 150, normal K, BUN 64 and Cr 0.57. LFTs normal. Albumin 1.7. CBC with WBC 23.4 with left shift, Hgb 9.4 with macrocytosis (unknown baseline) and PLT 280. CXR shows L PICC in adequate position, bilateral pulmonary opacities R > L with bilateral effusions R > L. CT head with no acute intracranial abnormality. Sinus inflammation of indefinite acuity.    SUBJECTIVE:  Remains critically ill,   On 60%/ +10 Equal balance with lasix afebrile  VITAL SIGNS: BP 137/72   Pulse (!) 110   Temp 97.8 F (36.6 C) (Oral)   Resp (!) 25   Ht 6' (1.829 m)   Wt 221 lb 9 oz (100.5 kg)   SpO2 98%   BMI 30.05 kg/m   HEMODYNAMICS: CVP:  [13 mmHg] 13 mmHg  VENTILATOR SETTINGS: Vent Mode: PRVC FiO2 (%):  [60 %] 60 % Set Rate:  [28 bmp] 28 bmp Vt Set:   [500 mL] 500 mL PEEP:  [10 cmH20] 10 cmH20 Plateau Pressure:  [32 cmH20-39 cmH20] 35 cmH20  INTAKE / OUTPUT: I/O last 3 completed shifts: In: 4150 [I.V.:300; NG/GT:3550; IV Piggyback:300] Out: 4085 [Urine:4085]  PHYSICAL EXAMINATION:  General Well nourished, well developed,  on vent via tracheostomy  HEENT No gross abnormalities. Oropharynx clear, mucosa dry. Mallampati II. Good dentition. Trach site with moderate tan secretions.   Pulmonary Coarse breath sounds bilaterally, decreased at bilateral bases on anterior/lateral exam. No ronchi or wheeze. Vent-assisted effort, symmetrical expansion.   Cardiovascular Irregularly irregular rhythm. S1, s2. No m/r/g. Distal pulses palpable.  Abdomen Soft, non-tender, non-distended, positive bowel sounds, no palpable organomegaly or masses. Normoresonant to percussion. PEG site dressing C/D/I.   Musculoskeletal Grossly normal with changes consistent with known paralysis.  Lymphatics No cervical, supraclavicular or axillary adenopathy.   Neurologic Left pupil reactive to light (R eye is glass), . 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      LABS:  BMET  Recent Labs Lab 01/18/16 0409 01/19/16 0500 01/20/16 0458  NA 149* 148* 152*  K 3.8 3.6 3.2*  CL 111 108 108  CO2 32 32 36*  BUN 62* 66* 59*  CREATININE 0.66 0.71 0.55*  GLUCOSE 75 155* 146*    Electrolytes  Recent Labs Lab 01/18/16 0409  01/18/16 1737 01/19/16  0500 01/19/16 1807 01/20/16 0458  CALCIUM 8.3*  --   --  8.1*  --  8.3*  MG  --   < > 2.6* 2.6* 2.5*  --   PHOS  --   < > 4.4 4.3 4.4  --   < > = values in this interval not displayed.  CBC  Recent Labs Lab 01/18/16 0409 01/19/16 0500 01/20/16 0458  WBC 21.7* 17.4* 20.8*  HGB 8.1* 9.5* 8.3*  HCT 29.1* 33.3* 29.7*  PLT 280 290 261    Coag's No results for input(s): APTT, INR in the last 168 hours.  Sepsis Markers  Recent  Labs Lab 01/16/16 1832  LATICACIDVEN 1.55    ABG  Recent Labs Lab 01/16/16 1829  PHART 7.213*  PCO2ART 86.5*  PO2ART 58.0*    Liver Enzymes  Recent Labs Lab 01/16/16 1819  AST 31  ALT 23  ALKPHOS 69  BILITOT 0.5  ALBUMIN 1.7*    Cardiac Enzymes No results for input(s): TROPONINI, PROBNP in the last 168 hours.  Glucose  Recent Labs Lab 01/19/16 1148 01/19/16 1553 01/19/16 1955 01/20/16 0011 01/20/16 0408 01/20/16 0738  GLUCAP 168* 160* 147* 137* 142* 142*    Imaging Dg Chest Port 1 View  Result Date: 01/20/2016 CLINICAL DATA:  Respiratory failure. EXAM: PORTABLE CHEST 1 VIEW COMPARISON:  01/19/2016. FINDINGS: Interval removal of left PICC line. Right IJ line stable position . Tracheostomy tube in stable position. Heart size normal. Persistent diffuse bilateral pulmonary infiltrates without improvement. Small bilateral pleural effusions again noted. No pneumothorax. IMPRESSION: 1. Interval removal of left PICC line. Interim placement of right IJ line, its tip is at the cavoatrial junction. 2. Persistent by diffuse pulmonary infiltrates. Small bilateral pleural effusions. No interim change from prior exam. Electronically Signed   By: Maisie Fus  Register   On: 01/20/2016 06:54   Dg Chest Port 1 View  Result Date: 01/19/2016 CLINICAL DATA:  Central line placement evaluation. EXAM: PORTABLE CHEST 1 VIEW COMPARISON:  January 19, 2016 FINDINGS: The left PICC line is stable. A new right IJ terminates in the central SVC. No pneumothorax. Layering bilateral effusions with underlying opacities. Diffuse bilateral pulmonary opacities are also similar in the interval. IMPRESSION: Appropriate placement of right IJ with no pneumothorax. Stable bilateral pleural effusions and pulmonary opacities. Electronically Signed   By: Gerome Sam III M.D   On: 01/19/2016 13:23     STUDIES:  Duplex 8/8 >> LUE brachial DVT around PICC  CULTURES: Blood x 2 8/5 >> coag neg staph 2/2 Urine 8/5  >>ESBL klebs 60k Tracheal aspirate 8/5 >>few GPR, GPC >>  ANTIBIOTICS: Vancomycin 8/5 >> Zosyn 8/5 >>8/8 Meropenem 8/8 >>  SIGNIFICANT EVENTS: Trach/PEG 3/28,  C3-5, T3-4 laminectomy 2/27,  dural AV fistula resection and cervical fusion 3/6, C6-7 harware revision 3/19.    LINES/TUBES: LUE PICC 8/4 >>8/8 RIJ 8/8 >>  PEG Trach  DISCUSSION:  Septic shock has resolved ARDS  -  has IVC filter so PE less likely   He appears anasarcic and total body fluid up, with obvious third spacing. His nutritional status is suboptimal with albumin 1.7.   ASSESSMENT / PLAN:  PULMONARY A: Acute on chronic hypoxemic / hypercapneic respiratory failure - baseline is reportedly 65% trach collar without hypercapnea - likely multifactorial and related to sepsis, pulmonary edema, pleural effusions, altered mental status and possible pneumonia Chronic trach Chronic Rt effusion -(prior taps in 10/2015 were transudative and attributed to hypoalbuminemia) Bronchoscopy 8/9 >> thick mucopurulent secretions LLL P:  ARDS protocol for PEEP/FIO2 - on 60%/+10, would lower FIO2 & stay on PEEP Pulmonary toilet May need trach change once PEEP down  CARDIOVASCULAR A:   septic shock Chronic atrial fibrillation Weaned off pressors 8/6  P:  Lopressor prn HR < 120   RENAL A:   Hypernatremia - likely secondary to inadequate free water and increased insensible losses in setting of sepsis AKI Cr 0.57 (baseline ~0.2) P:   Free water 250 Q4h , add D5W 50/h Ct Lasix 40 q 8H- may have to limit if Na/ BUN  rises further Replete lytes daily as needed  GASTROINTESTINAL A:   Protein calorie malnutrition with anasarca and albumin 1.7 P:   Ct TF Famotidine for stress ulcer ppx while on vent  HEMATOLOGIC A:   Leukocytosis with left shift Macrocytic anemia - unknown chronicity Hx DVT s/p IVC filter (for ?GIB) - mentioned in note from [facility] May 2017, neg LE duplex 8/8 Lt brachial DVT around PICC 8/8 P:   Trend CBC Keep proph lovenox    INFECTIOUS A:   Septic shock - favor line rather than  urinary vs pulmonary source Coag neg staph bacteremia Multiple decubs - sacrum, left heel - WOC following Serratia/ pseudomonas pneumonia (he had serratia in prior resp cx from [facility] so ? 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 family reports patient wishes to remain FULL CODE.  After decompression, they were told it may take 12 months for him to regain any movement .   - Inter-disciplinary family meet or Palliative Care meeting due by:  day 7    cc time x 35 m  Cyril Mourning MD. [facility].  Pulmonary & Critical care Pager [phone] If no response call [phone]    01/20/2016, 11:49 AM

## 2016-01-21 ENCOUNTER — Inpatient Hospital Stay (HOSPITAL_COMMUNITY): Payer: Medicare Other

## 2016-01-21 LAB — BASIC METABOLIC PANEL
ANION GAP: 6 (ref 5–15)
Anion gap: 3 — ABNORMAL LOW (ref 5–15)
BUN: 53 mg/dL — ABNORMAL HIGH (ref 6–20)
BUN: 52 mg/dL — ABNORMAL HIGH (ref 6–20)
CALCIUM: 8.3 mg/dL — AB (ref 8.9–10.3)
CALCIUM: 8.1 mg/dL — AB (ref 8.9–10.3)
CO2: 38 mmol/L — AB (ref 22–32)
CO2: 38 mmol/L — AB (ref 22–32)
CREATININE: 0.42 mg/dL — AB (ref 0.61–1.24)
Chloride: 108 mmol/L (ref 101–111)
Chloride: 106 mmol/L (ref 101–111)
Creatinine, Ser: 0.48 mg/dL — ABNORMAL LOW (ref 0.61–1.24)
GFR calc non Af Amer: >60 mL/min (ref >60)
GLUCOSE: 126 mg/dL — AB (ref 65–99)
Glucose, Bld: 140 mg/dL — ABNORMAL HIGH (ref 65–99)
POTASSIUM: 4.4 mmol/L (ref 3.5–5.1)
Potassium: 3.7 mmol/L (ref 3.5–5.1)
SODIUM: 147 mmol/L — AB (ref 135–145)
Sodium: 152 mmol/L — ABNORMAL HIGH (ref 135–145)

## 2016-01-21 LAB — CBC
HEMATOCRIT: 28 % — AB (ref 39.0–52.0)
HEMOGLOBIN: 7.8 g/dL — AB (ref 13.0–17.0)
MCH: 26.9 pg (ref 26.0–34.0)
MCHC: 27.9 g/dL — ABNORMAL LOW (ref 30.0–36.0)
MCV: 96.6 fL (ref 78.0–100.0)
Platelets: 286 10*3/uL (ref 150–400)
RBC: 2.9 MIL/uL — AB (ref 4.22–5.81)
RDW: 17.7 % — ABNORMAL HIGH (ref 11.5–15.5)
WBC: 14.3 10*3/uL — ABNORMAL HIGH (ref 4.0–10.5)

## 2016-01-21 LAB — GLUCOSE, CAPILLARY
GLUCOSE-CAPILLARY: 120 mg/dL — AB (ref 65–99)
GLUCOSE-CAPILLARY: 117 mg/dL — AB (ref 65–99)
Glucose-Capillary: 126 mg/dL — ABNORMAL HIGH (ref 65–99)
Glucose-Capillary: 130 mg/dL — ABNORMAL HIGH (ref 65–99)
Glucose-Capillary: 136 mg/dL — ABNORMAL HIGH (ref 65–99)
Glucose-Capillary: 143 mg/dL — ABNORMAL HIGH (ref 65–99)

## 2016-01-21 LAB — HEPATITIS C ANTIBODY (REFLEX): HCV AB: 0.2 {s_co_ratio} (ref 0.0–0.9)

## 2016-01-21 LAB — HCV COMMENT:

## 2016-01-21 LAB — HEPATITIS B SURFACE ANTIGEN: HEP B S AG: NEGATIVE

## 2016-01-21 LAB — HIV ANTIBODY (ROUTINE TESTING W REFLEX): HIV SCREEN 4TH GENERATION: NONREACTIVE

## 2016-01-21 NOTE — Progress Notes (Signed)
Pt seen for trach team follow up. No education given at this time. All needed equipment at bedside. Will continue to follow 

## 2016-01-21 NOTE — Progress Notes (Signed)
PULMONARY / CRITICAL CARE MEDICINE   Name: Andrew Reynolds MRN: [id number] DOB: 1945/10/11    ADMISSION DATE:  01/16/2016 CONSULTATION DATE:  01/16/16  REFERRING MD:  EDP  CHIEF COMPLAINT:  Altered mental status, low blood pressure   BRIEF Andrew Reynolds is a 77M who presented to the ED from [facility] after staff noted changes to his usual level of alertness and drew a blood gas showing hypercarbia. His PMH is significant for atrial fibrillation, previously on Eliquis. He fell in February of this year and developed hematomas at C6-7-8 that resulted in near complete paralysis. He underwent multiple surgeries and was discharged to [facility]. He then developed a pneumonia and required tracheotomy. He has had 12 or 13 UTIs in the last 4 months with multiple courses of antibiotics. He had a PICC line placed just 8/4  by the LTAC. Has IVC filter placed @ [facility]  On arrival to the ED 01/16/2016 - he was hypotensive, hypothermic and minimally responsive. ABG did demonstrate a respiratory acidosis and he was put on the ventilator. Cultures were drawn, he received IVF and was started on vancomycin and zosyn. Initial chemistries showed hypernatremia with Na 150, normal K, BUN 64 and Cr 0.57. LFTs normal. Albumin 1.7. CBC with WBC 23.4 with left shift, Hgb 9.4 with macrocytosis (unknown baseline) and PLT 280. CXR shows L PICC in adequate position, bilateral pulmonary opacities R > L with bilateral effusions R > L. CT head with no acute intracranial abnormality. Sinus inflammation of indefinite acuity.     LINES/TUBES: LUE PICC 8/4 >>8/8 RIJ 8/8 >> PEG Trach   STUDIES:  Duplex 8/8 >> LUE brachial DVT around PICC  CULTURES: May 2017  At [facility] trach - serrati and pseudomonas ... 01/16/16 : Blood x 2 8/5 >> coag neg staph 2/2 (ID thinks this is source of infection) 01/16/16 Urine 8/5 >>ESBL klebs 60k 01/17/16 Tracheal aspirate 8/6  - serratia and pseudomonas - generally sensitive (ID thinks this is colonizer  ) .... 8./8/17 blood culture x 2 01/20/16 - BAL bronch -   ANTIBIOTICS: Vancomycin 8/5 >> Zosyn 8/5 >>8/8 Meropenem 8/8 >>  SIGNIFICANT EVENTS: Trach/PEG 3/28,  C3-5, T3-4 laminectomy 2/27,  dural AV fistula resection and cervical fusion 3/6, C6-7 harware revision 3/19.   SUBJECTIVE:  01/20/16: Remains critically ill,  On 60%/ +10, Equal balance with lasix, afebrile Bronchoscopy 8/9 >> thick mucopurulent secretions LLL   SUBJECTIVE/OVERNIGHT/INTERVAL HX 01/21/16: 50% fio2. Oriented but paraparetic - baseline. Never needed pressors.  Makes urine. Low gradefever  01/21/16 bt wbc reducing. S/p bronc yesterday. Not on sedation gtt. Per RN - son was unpleasant at time of bronc consenting on phone. No family ever came at bedside since admit.  RN reports 3rd spacing despite lasix  VITAL SIGNS: BP 135/82   Pulse (!) 101   Temp 99.3 F (37.4 C) (Oral)   Resp (!) 28   Ht 6' (1.829 m)   Wt 105 kg (231 lb 7.7 oz)   SpO2 94%   BMI 31.39 kg/m   HEMODYNAMICS: CVP:  [11 mmHg-14 mmHg] 14 mmHg  VENTILATOR SETTINGS: Vent Mode: PRVC FiO2 (%):  [50 %] 50 % Set Rate:  [28 bmp] 28 bmp Vt Set:  [500 mL] 500 mL PEEP:  [10 cmH20] 10 cmH20 Plateau Pressure:  [32 cmH20-36 cmH20] 32 cmH20  INTAKE / OUTPUT: I/O last 3 completed shifts: In: 4429.2 [I.V.:1429.2; NG/GT:2200; IV Piggyback:800] Out: 2580 [Urine:2580]  PHYSICAL EXAMINATION:  BP 135/82   Pulse (!) 101  Temp 99.9 F (37.7 C) (Oral)   Resp (!) 28   Ht 6' (1.829 m)   Wt 105 kg (231 lb 7.7 oz)   SpO2 94%   BMI 31.39 kg/m       Gen:  Frail male, on trach Neuro: alertand understands convo per RN but paraplegic Ext: Significant edema + Resp: synch with vent. Coarse CVS: Normal heart sounds Abd: soft.  Ext: edema +.  On boots Skin: stage 2-3 sacral wound per RN (preadmit finding)    LABS:  PULMONARY  Recent Labs Lab 01/16/16 1829  PHART 7.213*  PCO2ART 86.5*  PO2ART 58.0*  HCO3 35.7*  TCO2 38  O2SAT 85.0     CBC  Recent Labs Lab 01/19/16 0500 01/20/16 0458 01/21/16 0530  HGB 9.5* 8.3* 7.8*  HCT 33.3* 29.7* 28.0*  WBC 17.4* 20.8* 14.3*  PLT 290 261 286    COAGULATION No results for input(s): INR in the last 168 hours.  CARDIAC  No results for input(s): TROPONINI in the last 168 hours. No results for input(s): PROBNP in the last 168 hours.   CHEMISTRY  Recent Labs Lab 01/17/16 0022 01/18/16 0409 01/18/16 1006 01/18/16 1737 01/19/16 0500 01/19/16 1807 01/20/16 0458 01/21/16 0530  NA 150* 149*  --   --  148*  --  152* 152*  K 4.2 3.8  --   --  3.6  --  3.2* 4.4  CL 111 111  --   --  108  --  108 108  CO2 33* 32  --   --  32  --  36* 38*  GLUCOSE 127* 75  --   --  155*  --  146* 140*  BUN 67* 62*  --   --  66*  --  59* 53*  CREATININE 0.63 0.66  --   --  0.71  --  0.55* 0.48*  CALCIUM 8.0* 8.3*  --   --  8.1*  --  8.3* 8.3*  MG 2.5*  --  2.6* 2.6* 2.6* 2.5*  --   --   PHOS 5.5*  --  4.3 4.4 4.3 4.4  --   --    Estimated Creatinine Clearance: 107.7 mL/min (by C-G formula based on SCr of 0.8 mg/dL).   LIVER  Recent Labs Lab 01/16/16 1819  AST 31  ALT 23  ALKPHOS 69  BILITOT 0.5  PROT 7.0  ALBUMIN 1.7*     INFECTIOUS  Recent Labs Lab 01/16/16 1832  LATICACIDVEN 1.55     ENDOCRINE CBG (last 3)   Recent Labs  01/20/16 2353 01/21/16 0426 01/21/16 0803  GLUCAP 130* 136* 126*         IMAGING x48h  - image(s) personally visualized  -   highlighted in bold Dg Chest Port 1 View  Result Date: 01/21/2016 CLINICAL DATA:  Hypoxia. EXAM: PORTABLE CHEST 1 VIEW COMPARISON:  01/20/2016. FINDINGS: Tracheostomy tube and right IJ line stable position. Heart size normal. Persistent diffuse multifocal bilateral infiltrates, no change from prior exam. Small bilateral pleural effusions unchanged. No pneumothorax. IMPRESSION: 1.  Lines and tubes in stable position. 2. Persistent multifocal bilateral pulmonary infiltrates no interim change. Bilateral pleural  effusions unchanged. Electronically Signed   By: Maisie Fus  Register   On: 01/21/2016 07:15   Dg Chest Port 1 View  Result Date: 01/20/2016 CLINICAL DATA:  Respiratory failure. EXAM: PORTABLE CHEST 1 VIEW COMPARISON:  01/19/2016. FINDINGS: Interval removal of left PICC line. Right IJ line stable position . Tracheostomy tube in  stable position. Heart size normal. Persistent diffuse bilateral pulmonary infiltrates without improvement. Small bilateral pleural effusions again noted. No pneumothorax. IMPRESSION: 1. Interval removal of left PICC line. Interim placement of right IJ line, its tip is at the cavoatrial junction. 2. Persistent by diffuse pulmonary infiltrates. Small bilateral pleural effusions. No interim change from prior exam. Electronically Signed   By: Maisie Fus  Register   On: 01/20/2016 06:54   Dg Chest Port 1 View  Result Date: 01/19/2016 CLINICAL DATA:  Central line placement evaluation. EXAM: PORTABLE CHEST 1 VIEW COMPARISON:  January 19, 2016 FINDINGS: The left PICC line is stable. A new right IJ terminates in the central SVC. No pneumothorax. Layering bilateral effusions with underlying opacities. Diffuse bilateral pulmonary opacities are also similar in the interval. IMPRESSION: Appropriate placement of right IJ with no pneumothorax. Stable bilateral pleural effusions and pulmonary opacities. Electronically Signed   By: Gerome Sam III M.D   On: 01/19/2016 13:23     DISCUSSION:  Septic shock has resolved ARDS  -  has IVC filter so PE less likely   He appears anasarcic and total body fluid up, with obvious third spacing. His nutritional status is suboptimal with albumin 1.7.   ASSESSMENT / PLAN:    PULMONARY A: Acute on chronic hypoxemic / hypercapneic respiratory failure - baseline is reportedly 65% trach collar without hypercapnea - likely multifactorial and related to sepsis, pulmonary edema, pleural effusions, altered mental status and possible pneumonia Chronic  trach Chronic Rt effusion -(prior taps in 10/2015 were transudative and attributed to hypoalbuminemia) Bronchoscopy 8/9 >> thick mucopurulent secretions LLL   - improved 01/21/16.Now on continous vent. 50% fio2  P:   ARDS protocol for PEEP/FIO2 - on 60%/+10, would lower FIO2 & stay on PEEP Pulmonary toilet May need trach change once PEEP down  CARDIOVASCULAR A:   septic shock Chronic atrial fibrillation Weaned off pressors 8/6    - never needed pressors P:  Lopressor prn HR < 120   RENAL A:   Hypernatremia - likely secondary to inadequate free water and increased insensible losses in setting of sepsis AKI Cr 0.57 (baseline ~0.2) P:   Free water 250 Q4h , add D5W 50/h Ct Lasix 40 q 8H- may have to limit if Na/ BUN  rises further Replete lytes daily as needed  GASTROINTESTINAL A:   Protein calorie malnutrition with anasarca and albumin 1.7 P:   Ct TF Famotidine for stress ulcer ppx while on vent  HEMATOLOGIC A:   Leukocytosis with left shift Macrocytic anemia - unknown chronicity Hx DVT s/p IVC filter (for ?GIB) - mentioned in note from [facility] May 2017, neg LE duplex 8/8 Lt brachial DVT around PICC 8/8 P:  Trend CBC Keep proph lovenox    INFECTIOUS  A:   Septic shock - favor line rather than  urinary vs pulmonary source Coag neg staph bacteremia Multiple decubs - sacrum, left heel - WOC following Serratia/ pseudomonas pneumonia (he had serratia in prior resp cx from [facility] so ? 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 family reports  patient wishes to remain FULL CODE ( After  decompression, they were told it may take 12 months for him to regain any movement . )/ None at bedside 01/21/16  - Inter-disciplinary family meet or Palliative Care meeting due by:  day 7    GLOBAL Move to sdu; care mgmt called to get back to [facility]   The patient is critically ill with multiple organ systems failure and requires high complexity decision making for assessment and support, frequent evaluation and titration of therapies, application of advanced monitoring technologies and extensive interpretation of multiple databases.   Critical Care Time devoted to patient care services described in this note is  30  Minutes. This time reflects time of care of this signee Dr Kalman Shan. This critical care time does not reflect procedure time, or teaching time or supervisory time of PA/NP/Med student/Med Resident etc but could involve care discussion time    Dr. Kalman Shan, M.D., [facility].C.P Pulmonary and Critical Care Medicine Staff Physician [facility] Pulmonary and Critical Care Pager: [phone], If no answer or between  15:00h - 7:00h: call [phone]  01/21/2016 11:21 AM

## 2016-01-21 NOTE — Progress Notes (Signed)
Subjective:  Patient not interact or follow commands for me today  Antibiotics:  Anti-infectives    Start     Dose/Rate Route Frequency Ordered Stop   01/20/16 1300  vancomycin (VANCOCIN) IVPB 750 mg/150 ml premix     750 mg 150 mL/hr over 60 Minutes Intravenous Every 12 hours 01/20/16 0918     01/19/16 1100  meropenem (MERREM) 1 g in sodium chloride 0.9 % 100 mL IVPB     1 g 200 mL/hr over 30 Minutes Intravenous Every 8 hours 01/19/16 1056     01/17/16 0200  piperacillin-tazobactam (ZOSYN) IVPB 3.375 g  Status:  Discontinued     3.375 g 12.5 mL/hr over 240 Minutes Intravenous Every 8 hours 01/16/16 1906 01/19/16 1034   01/17/16 0200  vancomycin (VANCOCIN) IVPB 1000 mg/200 mL premix  Status:  Discontinued     1,000 mg 200 mL/hr over 60 Minutes Intravenous Every 8 hours 01/16/16 1906 01/18/16 1037   01/16/16 1830  vancomycin (VANCOCIN) 2,000 mg in sodium chloride 0.9 % 500 mL IVPB     2,000 mg 250 mL/hr over 120 Minutes Intravenous  Once 01/16/16 1823 01/16/16 2133   01/16/16 1830  piperacillin-tazobactam (ZOSYN) IVPB 3.375 g  Status:  Discontinued     3.375 g 100 mL/hr over 30 Minutes Intravenous  Once 01/16/16 1823 01/16/16 1823   01/16/16 1815  piperacillin-tazobactam (ZOSYN) IVPB 3.375 g     3.375 g 100 mL/hr over 30 Minutes Intravenous  Once 01/16/16 1811 01/16/16 1917   01/16/16 1815  vancomycin (VANCOCIN) IVPB 1000 mg/200 mL premix  Status:  Discontinued     1,000 mg 200 mL/hr over 60 Minutes Intravenous  Once 01/16/16 1811 01/16/16 1823      Medications: Scheduled Meds: . acetylcysteine  4 mL Nebulization TID  . antiseptic oral rinse  7 mL Mouth Rinse q12n4p  . chlorhexidine  15 mL Mouth Rinse BID  . collagenase   Topical Daily  . enoxaparin (LOVENOX) injection  40 mg Subcutaneous Q24H  . famotidine  20 mg Per Tube BID  . feeding supplement (PRO-STAT SUGAR FREE 64)  30 mL Per Tube Daily  . free water  250 mL Per Tube Q4H  . furosemide  40 mg  Intravenous Q8H  . ipratropium-albuterol  3 mL Nebulization Q6H  . meropenem (MERREM) IV  1 g Intravenous Q8H  . vancomycin  750 mg Intravenous Q12H   Continuous Infusions: . dextrose 50 mL (01/21/16 0524)  . feeding supplement (VITAL AF 1.2 CAL) 1,000 mL (01/20/16 2334)   PRN Meds:.sodium chloride, fentaNYL (SUBLIMAZE) injection, fentaNYL (SUBLIMAZE) injection    Objective: Weight change: 9 lb 14.7 oz (4.5 kg)  Intake/Output Summary (Last 24 hours) at 01/21/16 1540 Last data filed at 01/21/16 1500  Gross per 24 hour  Intake             3615 ml  Output             1430 ml  Net             2185 ml   Blood pressure (!) 162/75, pulse 93, temperature 99.9 F (37.7 C), temperature source Oral, resp. rate (!) 28, height 6' (1.829 m), weight 231 lb 7.7 oz (105 kg), SpO2 96 %. Temp:  [98 F (36.7 C)-99.9 F (37.7 C)] 99.9 F (37.7 C) (08/10 1122) Pulse Rate:  [93-112] 93 (08/10 1526) Resp:  [22-29] 28 (08/10 1526) BP: (127-162)/(71-87) 162/75 (08/10 1526) SpO2:  [  88 %-99 %] 96 % (08/10 1526) FiO2 (%):  [50 %] 50 % (08/10 1526) Weight:  [231 lb 7.7 oz (105 kg)] 231 lb 7.7 oz (105 kg) (08/10 0429)  Physical Exam: General: alert but not interactive HEENT: anicteric sclera,  EOMI, oropharynx clear and without exudate, tracheostomy in place with copious purulent secretions dripping down side of tracheostomy RN alerted and was aware Cardiovascular: tachy  rate, normal r,  no murmur rubs or gallops Pulmonary: rhonchi Gastrointestinal: soft nontender, nondistended, normal bowel sounds, Musculoskeletal: edematous Skin, soft tissue: decubiti not examined Neuro: tetraplegic  CBC:  CBC Latest Ref Rng & Units 01/21/2016 01/20/2016 01/19/2016  WBC 4.0 - 10.5 K/uL 14.3(H) 20.8(H) 17.4(H)  Hemoglobin 13.0 - 17.0 g/dL 7.8(L) 8.3(L) 9.5(L)  Hematocrit 39.0 - 52.0 % 28.0(L) 29.7(L) 33.3(L)  Platelets 150 - 400 K/uL 286 261 290    BMET  Recent Labs  01/20/16 0458 01/21/16 0530  NA 152*  152*  K 3.2* 4.4  CL 108 108  CO2 36* 38*  GLUCOSE 146* 140*  BUN 59* 53*  CREATININE 0.55* 0.48*  CALCIUM 8.3* 8.3*     Liver Panel  No results for input(s): PROT, ALBUMIN, AST, ALT, ALKPHOS, BILITOT, BILIDIR, IBILI in the last 72 hours.     Sedimentation Rate No results for input(s): ESRSEDRATE in the last 72 hours. C-Reactive Protein No results for input(s): CRP in the last 72 hours.  Micro Results: Recent Results (from the past 720 hour(s))  Blood Culture (routine x 2)     Status: Abnormal (Preliminary result)   Collection Time: 01/16/16  6:20 PM  Result Value Ref Range Status   Specimen Description BLOOD LEFT HAND  Final   Special Requests IN PEDIATRIC BOTTLE 2 CC  Final   Culture  Setup Time   Final    GRAM POSITIVE COCCI IN CLUSTERS CRITICAL RESULT CALLED TO, READ BACK BY AND VERIFIED WITH: TO GABOTT(PHARD) BY TCLEVELAND 01/18/2016 AT 4:37AM AEROBIC BOTTLE ONLY    Culture (A)  Final    STAPHYLOCOCCUS SPECIES (COAGULASE NEGATIVE) REPEATING TO CONFIRM ID    Report Status PENDING  Incomplete  Blood Culture ID Panel (Reflexed)     Status: None   Collection Time: 01/16/16  6:20 PM  Result Value Ref Range Status   Enterococcus species NOT DETECTED NOT DETECTED Final   Vancomycin resistance NOT DETECTED NOT DETECTED Final   Listeria monocytogenes NOT DETECTED NOT DETECTED Final   Staphylococcus species NOT DETECTED NOT DETECTED Final   Staphylococcus aureus NOT DETECTED NOT DETECTED Final   Methicillin resistance NOT DETECTED NOT DETECTED Final   Streptococcus species NOT DETECTED NOT DETECTED Final   Streptococcus agalactiae NOT DETECTED NOT DETECTED Final   Streptococcus pneumoniae NOT DETECTED NOT DETECTED Final   Streptococcus pyogenes NOT DETECTED NOT DETECTED Final   Acinetobacter baumannii NOT DETECTED NOT DETECTED Final   Enterobacteriaceae species NOT DETECTED NOT DETECTED Final   Enterobacter cloacae complex NOT DETECTED NOT DETECTED Final   Escherichia  coli NOT DETECTED NOT DETECTED Final   Klebsiella oxytoca NOT DETECTED NOT DETECTED Final   Klebsiella pneumoniae NOT DETECTED NOT DETECTED Final   Proteus species NOT DETECTED NOT DETECTED Final   Serratia marcescens NOT DETECTED NOT DETECTED Final   Carbapenem resistance NOT DETECTED NOT DETECTED Final   Haemophilus influenzae NOT DETECTED NOT DETECTED Final   Neisseria meningitidis NOT DETECTED NOT DETECTED Final   Pseudomonas aeruginosa NOT DETECTED NOT DETECTED Final   Candida albicans NOT DETECTED NOT DETECTED Final  Candida glabrata NOT DETECTED NOT DETECTED Final   Candida krusei NOT DETECTED NOT DETECTED Final   Candida parapsilosis NOT DETECTED NOT DETECTED Final   Candida tropicalis NOT DETECTED NOT DETECTED Final  Blood Culture (routine x 2)     Status: Abnormal (Preliminary result)   Collection Time: 01/16/16  6:25 PM  Result Value Ref Range Status   Specimen Description BLOOD LEFT WRIST  Final   Special Requests IN PEDIATRIC BOTTLE 3 CC  Final   Culture  Setup Time   Final    GRAM POSITIVE COCCI IN CLUSTERS AEROBIC BOTTLE ONLY CRITICAL RESULT CALLED TO, READ BACK BY AND VERIFIED WITH: C ABBOTT,PHARMD AT 0437 01/18/16 BY T [place]    Culture STAPHYLOCOCCUS SPECIES (COAGULASE NEGATIVE) (A)  Final   Report Status PENDING  Incomplete  Urine culture     Status: Abnormal   Collection Time: 01/16/16  8:25 PM  Result Value Ref Range Status   Specimen Description URINE, RANDOM  Final   Special Requests NONE  Final   Culture (A)  Final    60,000 COLONIES/mL KLEBSIELLA PNEUMONIAE Confirmed Extended Spectrum Beta-Lactamase Producer (ESBL)    Report Status 01/19/2016 FINAL  Final   Organism ID, Bacteria KLEBSIELLA PNEUMONIAE (A)  Final      Susceptibility   Klebsiella pneumoniae - MIC*    AMPICILLIN >=32 RESISTANT Resistant     CEFAZOLIN >=64 RESISTANT Resistant     CEFTRIAXONE 8 RESISTANT Resistant     CIPROFLOXACIN 2 INTERMEDIATE Intermediate     GENTAMICIN <=1  SENSITIVE Sensitive     IMIPENEM <=0.25 SENSITIVE Sensitive     NITROFURANTOIN 128 RESISTANT Resistant     TRIMETH/SULFA <=20 SENSITIVE Sensitive     AMPICILLIN/SULBACTAM >=32 RESISTANT Resistant     PIP/TAZO 32 INTERMEDIATE Intermediate     Extended ESBL POSITIVE Resistant     * 60,000 COLONIES/mL KLEBSIELLA PNEUMONIAE  MRSA PCR Screening     Status: None   Collection Time: 01/16/16 11:29 PM  Result Value Ref Range Status   MRSA by PCR NEGATIVE NEGATIVE Final    Comment:        The GeneXpert MRSA Assay (FDA approved for NASAL specimens only), is one component of a comprehensive MRSA colonization surveillance program. It is not intended to diagnose MRSA infection nor to guide or monitor treatment for MRSA infections.   Culture, respiratory (NON-Expectorated)     Status: None   Collection Time: 01/17/16  1:32 AM  Result Value Ref Range Status   Specimen Description TRACHEAL ASPIRATE  Final   Special Requests Normal  Final   Gram Stain   Final    ABUNDANT WBC PRESENT, PREDOMINANTLY PMN NO SQUAMOUS EPITHELIAL CELLS SEEN FEW GRAM POSITIVE RODS FEW GRAM POSITIVE COCCI IN PAIRS    Culture   Final    ABUNDANT SERRATIA MARCESCENS ABUNDANT PSEUDOMONAS AERUGINOSA    Report Status 01/20/2016 FINAL  Final   Organism ID, Bacteria SERRATIA MARCESCENS  Final   Organism ID, Bacteria PSEUDOMONAS AERUGINOSA  Final      Susceptibility   Pseudomonas aeruginosa - MIC*    CEFTAZIDIME 4 SENSITIVE Sensitive     CIPROFLOXACIN <=0.25 SENSITIVE Sensitive     GENTAMICIN <=1 SENSITIVE Sensitive     IMIPENEM 2 SENSITIVE Sensitive     PIP/TAZO 8 SENSITIVE Sensitive     CEFEPIME 4 SENSITIVE Sensitive     * ABUNDANT PSEUDOMONAS AERUGINOSA   Serratia marcescens - MIC*    CEFAZOLIN >=64 RESISTANT Resistant     CEFEPIME <=  1 SENSITIVE Sensitive     CEFTAZIDIME <=1 SENSITIVE Sensitive     CEFTRIAXONE <=1 SENSITIVE Sensitive     CIPROFLOXACIN <=0.25 SENSITIVE Sensitive     GENTAMICIN <=1 SENSITIVE  Sensitive     TRIMETH/SULFA <=20 SENSITIVE Sensitive     * ABUNDANT SERRATIA MARCESCENS  Culture, blood (Routine X 2) w Reflex to ID Panel     Status: None (Preliminary result)   Collection Time: 01/19/16  3:04 PM  Result Value Ref Range Status   Specimen Description BLOOD LEFT HAND  Final   Special Requests IN PEDIATRIC BOTTLE 0.5CC  Final   Culture NO GROWTH < 24 HOURS  Final   Report Status PENDING  Incomplete  Culture, blood (Routine X 2) w Reflex to ID Panel     Status: None (Preliminary result)   Collection Time: 01/19/16  3:15 PM  Result Value Ref Range Status   Specimen Description BLOOD LEFT FINGER  Final   Special Requests IN PEDIATRIC BOTTLE 3CC  Final   Culture NO GROWTH < 24 HOURS  Final   Report Status PENDING  Incomplete  Culture, bal-quantitative     Status: None (Preliminary result)   Collection Time: 01/20/16 12:02 PM  Result Value Ref Range Status   Specimen Description BRONCHIAL ALVEOLAR LAVAGE  Final   Special Requests NONE  Final   Gram Stain   Final    ABUNDANT WBC PRESENT, PREDOMINANTLY PMN NO ORGANISMS SEEN    Culture CULTURE REINCUBATED FOR BETTER GROWTH  Final   Report Status PENDING  Incomplete    Studies/Results: Dg Chest Port 1 View  Result Date: 01/21/2016 CLINICAL DATA:  Hypoxia. EXAM: PORTABLE CHEST 1 VIEW COMPARISON:  01/20/2016. FINDINGS: Tracheostomy tube and right IJ line stable position. Heart size normal. Persistent diffuse multifocal bilateral infiltrates, no change from prior exam. Small bilateral pleural effusions unchanged. No pneumothorax. IMPRESSION: 1.  Lines and tubes in stable position. 2. Persistent multifocal bilateral pulmonary infiltrates no interim change. Bilateral pleural effusions unchanged. Electronically Signed   By: Maisie Fus  Register   On: 01/21/2016 07:15   Dg Chest Port 1 View  Result Date: 01/20/2016 CLINICAL DATA:  Respiratory failure. EXAM: PORTABLE CHEST 1 VIEW COMPARISON:  01/19/2016. FINDINGS: Interval removal of  left PICC line. Right IJ line stable position . Tracheostomy tube in stable position. Heart size normal. Persistent diffuse bilateral pulmonary infiltrates without improvement. Small bilateral pleural effusions again noted. No pneumothorax. IMPRESSION: 1. Interval removal of left PICC line. Interim placement of right IJ line, its tip is at the cavoatrial junction. 2. Persistent by diffuse pulmonary infiltrates. Small bilateral pleural effusions. No interim change from prior exam. Electronically Signed   By: Maisie Fus  Register   On: 01/20/2016 06:54      Assessment/Plan:  INTERVAL HISTORY:   Prior PICC removed, new PICC placed  TTE performed  Bronchoscopy performed  Active Problems:   Septic shock (HCC)   Pressure ulcer   Encounter for central line placement   Acute hypoxemic respiratory failure (HCC)   Glasgow coma scale total score 3-8 (HCC)   Coagulase negative Staphylococcus bacteremia   Pneumonia of left lower lobe due to Pseudomonas species (HCC)   Serratia infection   ESBL E. coli carrier   VAP (ventilator-associated pneumonia) (HCC)   Tracheostomy status (HCC)   Quadriplegia and quadriparesis (HCC)    Andrew Reynolds is a 70 y.o. male with with mx medical problems and catastrophic bleed into spine with tetraplegia, bed bound, with decubitus ulcers, with VDRF and now  that it was septic shock and hypothermia due to coagulase-negative staphylococcal bacteremia  #1 Coagulase negative Staphylococcus bacteremia: Micro-lab will look for identification of the organism to see if it is a Staphylococcus lugdunensis and to check sensis. PICC has been replaced.  If it is lugdunensis we would need to strongly consider treating for endocarditis unless we would rule this out with TEE (assuming continued aggressive rx being pursued.   --continue vancomycin    #2 VAP: he has Serratia and Pseudomonas growing from resp culture. Whether they were pathogens or colonizers he I snow on day  #5 of effective therapy  He continues to have copious secretions and cultures from BAL reincubated  I would like to de-esclate from his carbapenem but will also see what his BAL culture shows   #3 ESBL in the urine: unclear significance of this organism.     LOS: 5 days   Acey Lav 01/21/2016, 3:40 PM

## 2016-01-22 DIAGNOSIS — J9620 Acute and chronic respiratory failure, unspecified whether with hypoxia or hypercapnia: Secondary | ICD-10-CM

## 2016-01-22 LAB — CBC WITH DIFFERENTIAL/PLATELET
BASOS ABS: 0 10*3/uL (ref 0.0–0.1)
Basophils Relative: 0 %
EOS ABS: 0.1 10*3/uL (ref 0.0–0.7)
Eosinophils Relative: 1 %
HCT: 27.9 % — ABNORMAL LOW (ref 39.0–52.0)
HEMOGLOBIN: 7.9 g/dL — AB (ref 13.0–17.0)
LYMPHS ABS: 1.2 10*3/uL (ref 0.7–4.0)
Lymphocytes Relative: 9 %
MCH: 27.2 pg (ref 26.0–34.0)
MCHC: 28.3 g/dL — AB (ref 30.0–36.0)
MCV: 96.2 fL (ref 78.0–100.0)
MONOS PCT: 3 %
Monocytes Absolute: 0.4 10*3/uL (ref 0.1–1.0)
Neutro Abs: 12 10*3/uL — ABNORMAL HIGH (ref 1.7–7.7)
Neutrophils Relative %: 87 %
PLATELETS: 282 10*3/uL (ref 150–400)
RBC: 2.9 MIL/uL — AB (ref 4.22–5.81)
RDW: 17.7 % — AB (ref 11.5–15.5)
WBC: 13.7 10*3/uL — AB (ref 4.0–10.5)

## 2016-01-22 LAB — VANCOMYCIN, TROUGH: VANCOMYCIN TR: 22 ug/mL — AB (ref 15–20)

## 2016-01-22 LAB — GLUCOSE, CAPILLARY
GLUCOSE-CAPILLARY: 125 mg/dL — AB (ref 65–99)
Glucose-Capillary: 118 mg/dL — ABNORMAL HIGH (ref 65–99)
Glucose-Capillary: 138 mg/dL — ABNORMAL HIGH (ref 65–99)
Glucose-Capillary: 116 mg/dL — ABNORMAL HIGH (ref 65–99)

## 2016-01-22 MED ORDER — VANCOMYCIN HCL 500 MG IV SOLR
500.0000 mg | Freq: Two times a day (BID) | INTRAVENOUS | Status: DC
  Filled 2016-01-22: qty 500

## 2016-01-22 MED ORDER — METOPROLOL TARTRATE 25 MG PO TABS: 12.5000 mg | ORAL_TABLET | Freq: Two times a day (BID) | ORAL | Status: AC

## 2016-01-22 MED ORDER — PRO-STAT SUGAR FREE PO LIQD: 30.0000 mL | Freq: Every day | ORAL | 0 refills | Status: AC

## 2016-01-22 MED ORDER — VANCOMYCIN HCL 500 MG IV SOLR: 500.0000 mg | Freq: Two times a day (BID) | INTRAVENOUS | Status: AC

## 2016-01-22 MED ORDER — GABAPENTIN 100 MG PO CAPS: 300.0000 mg | ORAL_CAPSULE | Freq: Three times a day (TID) | ORAL | Status: AC

## 2016-01-22 MED ORDER — FAMOTIDINE 40 MG/5ML PO SUSR: 20.0000 mg | Freq: Two times a day (BID) | ORAL | 0 refills | Status: AC

## 2016-01-22 MED ORDER — VANCOMYCIN HCL IN DEXTROSE 750-5 MG/150ML-% IV SOLN: 750.0000 mg | Freq: Two times a day (BID) | INTRAVENOUS | Status: DC

## 2016-01-22 MED ORDER — FREE WATER: 250.0000 mL | Status: AC

## 2016-01-22 MED ORDER — VITAL AF 1.2 CAL PO LIQD: 1000.0000 mL | ORAL | Status: AC

## 2016-01-22 MED ORDER — COLLAGENASE 250 UNIT/GM EX OINT: TOPICAL_OINTMENT | Freq: Every day | CUTANEOUS | 0 refills | Status: AC

## 2016-01-22 NOTE — Progress Notes (Signed)
[facility] at bedside ready to transfer pt to [facility].

## 2016-01-22 NOTE — Progress Notes (Addendum)
Pharmacy Antibiotic Note- Follow- Up Note   Andrew Reynolds is a 70 y.o. male admitted on 01/16/2016 with PNA/UTI.  Pharmacy has been consulted for vanc dosing. Patient is quadriplegic. Patient is on Day 7 of vancomycin treatment for coag neg staph bacteremia. ID is following and believes that coag neg staph is the only pathogenic process despite ESBL in urine and respiratory culture. Obtained VT today and returned at 22 so will reduce dose.    Antimicrobials this admission: Zosyn 8/5 >> 8/8 Vanc 8/5 >>  Meropenem 8/8>>8/11- last dose tonight   Dose Adjsutments: 8/7 VT- 40- held Vancomycin for 2 days 8/9 Random-19 8/11 VT- 22   Microbiology results: 8/5 BCx: 2/2 Coag neg staph 8/5 UCx: 60,000 ESBL Klebsiella  8/5 MRSA PCR: Neg 8/6 RCx: Serratia (Cefazolin-R), Pseudomonas - Possible colonization, patient grew this out previously on admission to [facility] in May    Plan: Decrease Vanc to 500 mg Q 12 H- D/C on 8/18 per ID Check VT at [facility]  Monitor renal fxn, clinical s/sx VT goal- 15-20 Meropenem last doses today- ID believes that urine Klebsiella is non-pathogenic/Patient received 4 days of carbapenem therapy  Height: 6' (182.9 cm) Weight: 229 lb 15 oz (104.3 kg) IBW/kg (Calculated) : 77.6  Temp (24hrs), Avg:99 F (37.2 C), Min:98.6 F (37 C), Max:100.3 F (37.9 C)   Recent Labs Lab 01/16/16 1832  01/18/16 0409 01/18/16 0925 01/19/16 0500 01/20/16 0450 01/20/16 0458 01/21/16 0530 01/21/16 1733 01/22/16 0506  WBC  --   < > 21.7*  --  17.4*  --  20.8* 14.3*  --  13.7*  CREATININE  --   < > 0.66  --  0.71  --  0.55* 0.48* 0.42*  --   LATICACIDVEN 1.55  --   --   --   --   --   --   --   --   --   VANCOTROUGH  --   --   --  40*  --  19  --   --   --   --   < > = values in this interval not displayed.  Estimated Creatinine Clearance: 107.3 mL/min (by C-G formula based on SCr of 0.8 mg/dL).    Allergies  Allergen Reactions  . Sulfa Antibiotics Rash     Thank you for  allowing pharmacy to be a part of this patient's care!  Delila Spence D Pharmacy Resident [phone] 01/22/2016 1:19 PM

## 2016-01-22 NOTE — Discharge Summary (Signed)
Physician Discharge Summary  Patient ID: Andrew Reynolds MRN: [id number] DOB/AGE: 1945-09-01 70 y.o.  Admit date: 01/16/2016 Discharge date: 01/22/2016    Discharge Diagnoses:  Acute on Chronic Hypoxemic / Hypercapneic Respiratory Failure Serratia / Pseudomonas Pneumonia   Tracheostomy Status  Chronic Right Pleural Effusion  Septic Shock  Coagulase Negative Staph Bacteremia  Chronic Atrial Fibrillation  Hypernatremia  Acute Kidney Injury  ESBL Klebsiella in Urine (colonizer vs active infection) Protein Calorie Malnutrition  Leukocytosis with Left Shift Macrocytic Anemia  Hx DVT s/p IVC Filter (? 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                                                                       DISCHARGE PLAN BY DIAGNOSIS     Acute on Chronic Hypoxemic / Hypercapneic Respiratory Failure Serratia / Pseudomonas Pneumonia   Tracheostomy Status / Ventilator Dependent  Chronic Right Pleural Effusion   Discharge Plan: Continue PRVC Vt 500 / R 28 / PEEP 10 / FiO2 50% Wean PEEP / FiO2 for sats > 92% Pulmonary hygiene as able - mobilize, upright positioning, turn Q2 etc  Will need trach change once PEEP weaned to 5 Trach care per protocol with inner cannula change  Follow cultures, BAL & blood cultures pending at time of discharge Intermittent CXR  Duoneb Q6 hours   Septic Shock - in setting of coag negative staph bacteremia, did not require vasopressors  Coagulase Negative Staph Bacteremia  Chronic Atrial Fibrillation   Discharge Plan: Continue vancomycin for 2 weeks total, start date 01/16/16 Will need a vancomycin trough ASAP after transfer (Vanco trough 8/11 22 on 750 mg BID) Pharmacy to dose vancomycin for renal function  Completed carbapenem therapy while inpatient at [facility] Continue lopressor 12.5 mg, adjust dosing to BID Recommend ID consult at  [facility]   Hypernatremia  Acute Kidney Injury  ESBL Klebsiella in Urine (likely colonizer)  Discharge Plan: Free water 250 ml per tube Q4 hours Resume Diamox 250 mg Q12 per tube KCL 20 mEq QD  Protein Calorie Malnutrition   Discharge Plan: Continue tube feeding support with protein supplement  Nutrition consult for nutrition needs assessment  Continue Pepcid for stress ulcer prophylaxis   Leukocytosis with Left Shift Macrocytic Anemia  Hx DVT s/p IVC Filter (? 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  He underwent multiple surgeries (07/2015 C3-5, T3-4  laminectomy, 08/2015 Dural AV fistula resection and cervical fusion, C6-7 harware revision 08/2015) and was discharged to [facility]. He then developed a pneumonia and required tracheotomy. He has had 12 or 13 UTIs in the last 4 months with multiple courses of antibiotics. He had a PICC line placed 01/15/16  by the LTAC. Has IVC filter placed @ [facility] as well as trach and PEG placement.   On 8/5, he presented to the [facility] ED from [place] after staff noted changes to his usual level of alertness and drew a blood gas showing hypercarbia. On arrival to the ED 01/16/2016 - he was hypotensive, hypothermic and minimally responsive.  ABG demonstrated a respiratory acidosis and he was put on the ventilator. Cultures were drawn, he received 2562m IVF and was started on vancomycin and zosyn. He had been on antibiotics as [facility].  Initial chemistries showed hypernatremia with Na 150, normal K, BUN 64 and Cr 0.57. LFTs normal. Albumin 1.7. CBC with WBC 23.4 with left shift, Hgb 9.4 with macrocytosis (unknown baseline) and PLT 280. CXR showed L PICC in adequate position, bilateral pulmonary opacities R > L with bilateral effusions R > L. CT head with no acute intracranial abnormality. Sinus inflammation of indefinite acuity.  The patient was admitted to the ICU for further care.    Hospital evaluation notable for LUE DVT around LUE PICC line which was removed.  A L IJ CVC was placed for IV access.  He was treated with vancomycin and meropenem.  He was pan cultured with blood showing coag negative staph bacteremia.  Sputum showed serratia and pseudomonas which was felt to be a colonizer.  Urine culture grew ESBL klebsiella which was felt to be a colonizer.   Follow up blood & BAL pending at time of discharge.  The patient completed 7 days of carbapenem therapy while inpatient.  ID consulted for evaluation and antibiotic management.  Recommendations for 2 weeks therapy of renally dosed vancomycin in place.  Bronchoscopy was performed on 8/9 with BAL.  He was treated with inhaled mucomyst for secretion clearance.  He was initially placed on mechanical ventilation with improvement in hypercarbia.  [facility]  Course further complicated by anemia of chronic disease (no transfusions required).  He was medically cleared for discharge 8/11 with plans as above.             LINES/TUBES: LUE PICC 8/4 >>8/8 RIJ 8/8 >>  PEG >> Trach >>   STUDIES:  Duplex 8/8 >> LUE brachial DVT around PICC  CULTURES: May 2017  [facility] >> serratia and pseudomonas ...............Marland Kitchen8/5/17  BCx2 8/5 >> coag neg staph 2/2  (ID thinks this is source of infection) 01/16/16  Urine 8/5 >> ESBL klebsiella 60k >> S - gentamicin, imipenem, bactrim 01/17/16 Tracheal aspirate 8/6  - serratia and pseudomonas - generally sensitive (ID thinks this is colonizer ) 01/19/16  BCx 2 >> 01/20/16  Bronch  BAL >> abundant WBC >>  ANTIBIOTICS: Vancomycin 8/5 >> Zosyn 8/5 >>8/8 Meropenem 8/8 >>  SIGNIFICANT EVENTS: 08/10/15  C3-5, T3-4 laminectomy 08/30/15  Dural AV fistula resection and cervical fusion 3/6, C6-7 harware revision 09/08/15  Trach/PEG  01/16/16  [facility] hypotension, hypothermia >concern for septic shock    Discharge Exam: Gen: chronically ill appearing adult male in NAD Neuro: alert, attempts to communicate, minimal gross motor of upper ext's noted Ext: warm/dry, 2+ generalized edema  Resp: even/non-labored, lungs bilaterally  coarse  CVS: Normal heart sounds Abd: soft / non-tender, bsx4 active, tolerating TF.  Ext: warm/dry, space boots in place Skin: stage 2-3 sacral wound per RN (preadmit finding)  Vitals:   01/22/16 1158 01/22/16 1200 01/22/16 1300 01/22/16 1400  BP:  (!) 152/77 139/81 (!) 141/76  Pulse:  96 92 93  Resp:  17 (!) 22 (!) 28  Temp: 98.8 F (37.1 C)     TempSrc: Oral     SpO2:  97% 97% 97%  Weight:      Height:         Discharge Labs  BMET  Recent Labs Lab 01/17/16 0022 01/18/16 0409 01/18/16 1006 01/18/16 1737 01/19/16 0500 01/19/16 1807 01/20/16 0458 01/21/16 0530 01/21/16 1733  NA 150* 149*  --   --  148*  --  152* 152* 147*  K 4.2 3.8  --   --  3.6  --  3.2* 4.4 3.7  CL 111 111  --   --  108  --  108 108 106  CO2 33* 32  --   --  32  --  36* 38* 38*  GLUCOSE 127* 75  --   --  155*  --  146* 140* 126*  BUN 67* 62*  --   --  66*  --  59* 53* 52*  CREATININE 0.63 0.66  --   --  0.71  --  0.55* 0.48* 0.42*  CALCIUM 8.0* 8.3*  --   --  8.1*  --  8.3* 8.3* 8.1*  MG 2.5*  --  2.6* 2.6* 2.6* 2.5*  --   --   --   PHOS 5.5*  --  4.3  4.4 4.3 4.4  --   --   --     CBC  Recent Labs Lab 01/20/16 0458 01/21/16 0530 01/22/16 0506  HGB 8.3* 7.8* 7.9*  HCT 29.7* 28.0* 27.9*  WBC 20.8* 14.3* 13.7*  PLT 261 286 282    Follow-up Information    [facility] .   Specialty:  Skilled Nursing Engineer, manufacturing information: [place]. [place] [phone]             Medication List    STOP taking these medications   fentaNYL 50 MCG/HR Commonly known as:  DURAGESIC - dosed mcg/hr   ibuprofen 400 MG tablet Commonly known as:  ADVIL,MOTRIN   nitrofurantoin 25 MG/5ML suspension Commonly known as:  FURADANTIN   nitroGLYCERIN 0.4 MG SL tablet Commonly known as:  NITROSTAT   Ondansetron 4 MG Film   ondansetron 4 MG/2ML Soln injection Commonly known as:  ZOFRAN   PRESCRIPTION MEDICATION   PROTONIX 40 mg/20 mL Pack Generic drug:  pantoprazole sodium   terazosin 1 MG capsule Commonly known as:  HYTRIN   traZODone 50 MG tablet Commonly known as:  DESYREL   VANCOMYCIN HCL IV     TAKE these medications   acetaminophen 325 MG tablet Commonly known as:  TYLENOL Place 325 mg into feeding tube every 8 (eight) hours as needed (for pain).   acetaZOLAMIDE 250 MG tablet Commonly known as:  DIAMOX Place 250 mg into feeding tube every 12 (twelve) hours. FOR FLUID RETENTION   chlorhexidine 0.12 % solution Commonly known as:  PERIDEX Use as directed 15 mLs in the mouth or throat 2 (two) times daily. FOR TRACH   collagenase ointment Commonly known as:  SANTYL Apply topically daily. Apply Santyl to right buttock wounds Q day, then cover with  moist fluffed 2X2 and foam dressing.  (Change foam dressings Q 5 days or PRN soiling.)   DUONEB 0.5-2.5 (3) MG/3ML Soln Generic drug:  ipratropium-albuterol Take 3 mLs by nebulization every 6 (six) hours. RELATED TO ACUTE AND CHRONIC RESPIRATORY FAILURE, UNSPECIFIED WHETHER WITH HYPOXIA OR HYPERCAPNIA   famotidine 40  MG/5ML suspension Commonly known as:  PEPCID Place 2.5 mLs (20 mg total) into feeding tube 2 (two) times daily.   feeding supplement (PRO-STAT SUGAR FREE 64) Liqd Place 30 mLs into feeding tube daily.   feeding supplement (VITAL AF 1.2 CAL) Liqd Place 1,000 mLs into feeding tube continuous.   ferrous sulfate 300 (60 Fe) MG/5ML syrup Place 300 mg into feeding tube 2 (two) times daily.   free water Soln Place 250 mLs into feeding tube every 4 (four) hours.   gabapentin 100 MG capsule Commonly known as:  NEURONTIN Place 3 capsules (300 mg total) into feeding tube 3 (three) times daily. FOR PAIN What changed:  medication strength   metoprolol tartrate 25 MG tablet Commonly known as:  LOPRESSOR Place 0.5 tablets (12.5 mg total) into feeding tube 2 (two) times daily. FOR A-FIB/HOLD FOR PULSE LESS THAN 55 OR SBP <90 What changed:  when to take this   potassium chloride 20 MEQ/15ML (10%) Soln Place 20 mEq into feeding tube every morning.   PROBIOTIC PO Place 1 tablet into feeding tube 2 (two) times daily.   Protein Powd Place 1 scoop into feeding tube every 6 (six) hours. FOR WOUND HEALING   Senna 8.8 MG/5ML Syrp Place 8.6 mg into feeding tube 2 (two) times daily. FOR CONSTIPATION   vancomycin 500 mg in sodium chloride 0.9 % 100 mL Inject 500 mg into the vein every 12 (twelve) hours. Start taking on:  01/23/2016         Disposition:  [facility]   Discharged Condition: Andrew Reynolds has met maximum benefit of inpatient care and is medically stable and cleared for discharge.  Patient is pending follow up as above.      Time spent on disposition:  Greater than 35 minutes.   Signed: Noe Gens, NP-C [facility] Pgr: [phone] Office: [phone]

## 2016-01-22 NOTE — NC FL2 (Signed)
[place] MEDICAID FL2 LEVEL OF CARE SCREENING TOOL     IDENTIFICATION  Patient Name: Andrew Reynolds Birthdate: [birth year] Sex: male Admission Date (Current Location): 01/16/2016  [facility] and [place] Number:  Producer, television/film/video and Address:  [facility], [address] [place], [place] [address]      Provider Number: [id number]  Attending Physician Name and Address:  Alonza Smoker, DO  Relative Name and Phone Number:  Revin Corker [facility]) [phone]    Current Level of Care: Hospital Recommended Level of Care: Skilled Nursing Facility Prior Approval Number:    Date Approved/Denied:   PASRR Number: [id number]  Discharge Plan: SNF    Current Diagnoses: Patient Active Problem List   Diagnosis Date Noted  . VAP (ventilator-associated pneumonia) (HCC)   . Tracheostomy status (HCC)   . Quadriplegia and quadriparesis (HCC)   . Respiratory failure (HCC) 01/19/2016  . Enterococcal septicemia (HCC) 01/19/2016  . SSS (sick sinus syndrome) (HCC) 01/19/2016  . Tetraplegic (HCC) 01/19/2016  . Encounter for central line placement   . Acute hypoxemic respiratory failure (HCC)   . Glasgow coma scale total score 3-8 (HCC)   . Coagulase negative Staphylococcus bacteremia   . Pneumonia of left lower lobe due to Pseudomonas species (HCC)   . Serratia infection   . ESBL E. coli carrier   . Pressure ulcer 01/17/2016  . Septic shock (HCC) 01/16/2016    Orientation RESPIRATION BLADDER Height & Weight     Self, Place, Situation  Tracheostomy, Vent (Fi02%- 50; 34mm shiley) Incontinent, Indwelling catheter Weight: 229 lb 15 oz (104.3 kg) Height:  6' (182.9 cm)  BEHAVIORAL SYMPTOMS/MOOD NEUROLOGICAL BOWEL NUTRITION STATUS   (NONE)  (NONE) Incontinent Feeding tube (continuous; vital AF 1.2 cal)  AMBULATORY STATUS COMMUNICATION OF NEEDS Skin   Total Care Non-Verbally PU Stage and Appropriate Care, Other (Comment) (unstageable pressure ulcer 2 wounds lower  right buttocks; deep tissue pressure ulcer anterior foot left heel) PU Stage 1 Dressing: Daily (intact skin w/ dime size red area below where PICC line port sits)   PU Stage 3 Dressing: Daily (Wounds in 2 locations near sacrum)                 Personal Care Assistance Level of Assistance  Total care, Bathing, Feeding, Dressing Bathing Assistance: Maximum assistance Feeding assistance: Maximum assistance Dressing Assistance: Maximum assistance Total Care Assistance: Maximum assistance   Functional Limitations Info  Sight, Hearing, Speech Sight Info: Adequate Hearing Info: Adequate Speech Info: Impaired    SPECIAL CARE FACTORS FREQUENCY                       Contractures Contractures Info: Not present    Additional Factors Info  Code Status, Allergies, Suctioning Needs Code Status Info: FULL Allergies Info: Sulfa Antibiotics       Suctioning Needs: [place]; Tracheal Catheter- sucretion amount moderate   Current Medications (01/22/2016):  This is the current hospital active medication list Current Facility-Administered Medications  Medication Dose Route Frequency Provider Last Rate Last Dose  . 0.9 %  sodium chloride infusion  250 mL Intravenous PRN Orville Govern, MD 10 mL/hr at 01/22/16 0023 250 mL at 01/22/16 0023  . antiseptic oral rinse (CPC / CETYLPYRIDINIUM CHLORIDE 0.05%) solution 7 mL  7 mL Mouth Rinse q12n4p Oretha Milch, MD   7 mL at 01/21/16 1600  . chlorhexidine (PERIDEX) 0.12 % solution 15 mL  15 mL Mouth Rinse BID Kathy Breach  Wallace Keller, MD   15 mL at 01/22/16 0944  . collagenase (SANTYL) ointment   Topical Daily Roslynn Amble, MD      . dextrose 5 % solution   Intravenous Continuous Oretha Milch, MD 50 mL/hr at 01/22/16 0023 50 mL at 01/22/16 0023  . enoxaparin (LOVENOX) injection 40 mg  40 mg Subcutaneous Q24H Orville Govern, MD   40 mg at 01/22/16 0944  . famotidine (PEPCID) 40 MG/5ML suspension 20 mg  20 mg Per Tube BID Orville Govern, MD   20 mg at 01/22/16 0944  . feeding supplement (PRO-STAT SUGAR FREE 64) liquid 30 mL  30 mL Per Tube Daily Oretha Milch, MD   30 mL at 01/22/16 0944  . feeding supplement (VITAL AF 1.2 CAL) liquid 1,000 mL  1,000 mL Per Tube Continuous Oretha Milch, MD 75 mL/hr at 01/21/16 1550 1,000 mL at 01/21/16 1550  . fentaNYL (SUBLIMAZE) injection 50 mcg  50 mcg Intravenous Q15 min PRN Orville Govern, MD   50 mcg at 01/20/16 1109  . fentaNYL (SUBLIMAZE) injection 50 mcg  50 mcg Intravenous Q2H PRN Orville Govern, MD   50 mcg at 01/18/16 1107  . free water 250 mL  250 mL Per Tube Q4H Orville Govern, MD   250 mL at 01/22/16 0700  . furosemide (LASIX) injection 40 mg  40 mg Intravenous Q8H Oretha Milch, MD   40 mg at 01/22/16 0600  . ipratropium-albuterol (DUONEB) 0.5-2.5 (3) MG/3ML nebulizer solution 3 mL  3 mL Nebulization Q6H Roslynn Amble, MD   3 mL at 01/22/16 0832  . meropenem (MERREM) 1 g in sodium chloride 0.9 % 100 mL IVPB  1 g Intravenous Q8H Oretha Milch, MD   1 g at 01/22/16 0501  . vancomycin (VANCOCIN) IVPB 750 mg/150 ml premix  750 mg Intravenous Q12H Hillis Range, RPH   750 mg at 01/22/16 0023     Discharge Medications: Please see discharge summary for a list of discharge medications.  Relevant Imaging Results:  Relevant Lab Results:   Additional Information SS #[id number]  Rockwell Germany, LCSW

## 2016-01-22 NOTE — Progress Notes (Signed)
CSW has contacted [facility] re: return to SNF. VM left with admissions coordinator. CSW awaiting return phone call.          Lance Muss, LCSW [facility] ED/60M Clinical Social Worker [phone]

## 2016-01-22 NOTE — Progress Notes (Addendum)
Patient will DC to: [facility] Anticipated DC date: 01/22/2016 Family notified: HIPPA compliant VM left for [facility], Patient's son [phone])   Transport by: Melburn Hake  Nurse to Nurse Report: [phone] Please complete EMS transfer report.  Please have physician complete physician transfer form ([place])     Noe Gens, MSW, LCSW [facility] ED/44M Clinical Social Worker [phone]

## 2016-01-23 LAB — CULTURE, BLOOD (ROUTINE X 2)

## 2016-01-23 LAB — CULTURE, BAL-QUANTITATIVE W GRAM STAIN

## 2016-01-23 LAB — CULTURE, BAL-QUANTITATIVE

## 2016-01-24 ENCOUNTER — Inpatient Hospital Stay (HOSPITAL_COMMUNITY)
Admission: EM | Admit: 2016-01-24 | Discharge: 2016-01-26 | DRG: 189 | Disposition: A | Discharge status: Other Acute Inpatient Hospital | Payer: Medicare Other | Attending: Internal Medicine | Admitting: Internal Medicine

## 2016-01-24 ENCOUNTER — Inpatient Hospital Stay (HOSPITAL_COMMUNITY): Payer: Medicare Other

## 2016-01-24 ENCOUNTER — Encounter (HOSPITAL_COMMUNITY): Payer: Self-pay | Admitting: Emergency Medicine

## 2016-01-24 ENCOUNTER — Emergency Department (HOSPITAL_COMMUNITY): Payer: Medicare Other

## 2016-01-24 DIAGNOSIS — B957 Other staphylococcus as the cause of diseases classified elsewhere: Secondary | ICD-10-CM | POA: Diagnosis present

## 2016-01-24 DIAGNOSIS — L89153 Pressure ulcer of sacral region, stage 3: Secondary | ICD-10-CM | POA: Diagnosis not present

## 2016-01-24 DIAGNOSIS — J449 Chronic obstructive pulmonary disease, unspecified: Secondary | ICD-10-CM | POA: Diagnosis present

## 2016-01-24 DIAGNOSIS — J9622 Acute and chronic respiratory failure with hypercapnia: Secondary | ICD-10-CM | POA: Diagnosis not present

## 2016-01-24 DIAGNOSIS — Z931 Gastrostomy status: Secondary | ICD-10-CM | POA: Diagnosis not present

## 2016-01-24 DIAGNOSIS — L89150 Pressure ulcer of sacral region, unstageable: Secondary | ICD-10-CM | POA: Diagnosis not present

## 2016-01-24 DIAGNOSIS — E870 Hyperosmolality and hypernatremia: Secondary | ICD-10-CM | POA: Diagnosis not present

## 2016-01-24 DIAGNOSIS — Z87891 Personal history of nicotine dependence: Secondary | ICD-10-CM

## 2016-01-24 DIAGNOSIS — E872 Acidosis: Secondary | ICD-10-CM | POA: Diagnosis not present

## 2016-01-24 DIAGNOSIS — I259 Chronic ischemic heart disease, unspecified: Secondary | ICD-10-CM | POA: Diagnosis present

## 2016-01-24 DIAGNOSIS — J90 Pleural effusion, not elsewhere classified: Secondary | ICD-10-CM | POA: Diagnosis not present

## 2016-01-24 DIAGNOSIS — I495 Sick sinus syndrome: Secondary | ICD-10-CM | POA: Diagnosis present

## 2016-01-24 DIAGNOSIS — Z930 Tracheostomy status: Secondary | ICD-10-CM | POA: Diagnosis not present

## 2016-01-24 DIAGNOSIS — J9690 Respiratory failure, unspecified, unspecified whether with hypoxia or hypercapnia: Secondary | ICD-10-CM | POA: Diagnosis present

## 2016-01-24 DIAGNOSIS — J9621 Acute and chronic respiratory failure with hypoxia: Principal | ICD-10-CM | POA: Diagnosis present

## 2016-01-24 DIAGNOSIS — J80 Acute respiratory distress syndrome: Secondary | ICD-10-CM | POA: Diagnosis not present

## 2016-01-24 DIAGNOSIS — J9811 Atelectasis: Secondary | ICD-10-CM | POA: Diagnosis not present

## 2016-01-24 DIAGNOSIS — G8250 Quadriplegia, unspecified: Secondary | ICD-10-CM | POA: Diagnosis present

## 2016-01-24 DIAGNOSIS — I959 Hypotension, unspecified: Secondary | ICD-10-CM | POA: Diagnosis not present

## 2016-01-24 DIAGNOSIS — K219 Gastro-esophageal reflux disease without esophagitis: Secondary | ICD-10-CM | POA: Diagnosis present

## 2016-01-24 DIAGNOSIS — Z7901 Long term (current) use of anticoagulants: Secondary | ICD-10-CM | POA: Diagnosis not present

## 2016-01-24 DIAGNOSIS — Z9911 Dependence on respirator [ventilator] status: Secondary | ICD-10-CM | POA: Diagnosis not present

## 2016-01-24 DIAGNOSIS — Z22358 Carrier of other enterobacterales: Secondary | ICD-10-CM

## 2016-01-24 DIAGNOSIS — J811 Chronic pulmonary edema: Secondary | ICD-10-CM | POA: Diagnosis not present

## 2016-01-24 DIAGNOSIS — M069 Rheumatoid arthritis, unspecified: Secondary | ICD-10-CM | POA: Diagnosis present

## 2016-01-24 DIAGNOSIS — R7881 Bacteremia: Secondary | ICD-10-CM | POA: Diagnosis not present

## 2016-01-24 DIAGNOSIS — I4891 Unspecified atrial fibrillation: Secondary | ICD-10-CM | POA: Diagnosis present

## 2016-01-24 DIAGNOSIS — J9382 Other air leak: Secondary | ICD-10-CM | POA: Diagnosis not present

## 2016-01-24 DIAGNOSIS — R609 Edema, unspecified: Secondary | ICD-10-CM | POA: Diagnosis not present

## 2016-01-24 DIAGNOSIS — M7989 Other specified soft tissue disorders: Secondary | ICD-10-CM | POA: Diagnosis not present

## 2016-01-24 DIAGNOSIS — E8770 Fluid overload, unspecified: Secondary | ICD-10-CM | POA: Diagnosis not present

## 2016-01-24 DIAGNOSIS — H5441 Blindness, right eye, normal vision left eye: Secondary | ICD-10-CM | POA: Diagnosis present

## 2016-01-24 DIAGNOSIS — R0602 Shortness of breath: Secondary | ICD-10-CM

## 2016-01-24 DIAGNOSIS — M810 Age-related osteoporosis without current pathological fracture: Secondary | ICD-10-CM | POA: Diagnosis present

## 2016-01-24 DIAGNOSIS — R0902 Hypoxemia: Secondary | ICD-10-CM | POA: Diagnosis present

## 2016-01-24 DIAGNOSIS — R4182 Altered mental status, unspecified: Secondary | ICD-10-CM | POA: Diagnosis present

## 2016-01-24 DIAGNOSIS — K5900 Constipation, unspecified: Secondary | ICD-10-CM | POA: Diagnosis not present

## 2016-01-24 DIAGNOSIS — E876 Hypokalemia: Secondary | ICD-10-CM | POA: Diagnosis present

## 2016-01-24 DIAGNOSIS — Z86718 Personal history of other venous thrombosis and embolism: Secondary | ICD-10-CM

## 2016-01-24 DIAGNOSIS — Z2239 Carrier of other specified bacterial diseases: Secondary | ICD-10-CM

## 2016-01-24 HISTORY — DX: Pleural effusion, not elsewhere classified: J90

## 2016-01-24 HISTORY — DX: Essential (primary) hypertension: I10

## 2016-01-24 HISTORY — DX: Acute embolism and thrombosis of other specified deep vein of lower extremity, bilateral: I82.493

## 2016-01-24 HISTORY — DX: Pressure ulcer of unspecified buttock, unstageable: L89.300

## 2016-01-24 HISTORY — DX: Pneumonia, unspecified organism: J18.9

## 2016-01-24 HISTORY — DX: Constipation, unspecified: K59.00

## 2016-01-24 HISTORY — DX: Acute and chronic respiratory failure, unspecified whether with hypoxia or hypercapnia: J96.20

## 2016-01-24 HISTORY — DX: Gastro-esophageal reflux disease without esophagitis: K21.9

## 2016-01-24 HISTORY — DX: Anemia, unspecified: D64.9

## 2016-01-24 HISTORY — DX: Hypokalemia: E87.6

## 2016-01-24 HISTORY — DX: Dysphagia, oropharyngeal phase: R13.12

## 2016-01-24 HISTORY — DX: Dependence on respirator (ventilator) status: Z99.11

## 2016-01-24 HISTORY — DX: Quadriplegia, C1-C4 complete: G82.51

## 2016-01-24 HISTORY — DX: History of falling: Z91.81

## 2016-01-24 HISTORY — DX: Unspecified atrial fibrillation: I48.91

## 2016-01-24 HISTORY — DX: Unspecified protein-calorie malnutrition: E46

## 2016-01-24 HISTORY — DX: Muscle weakness (generalized): M62.81

## 2016-01-24 HISTORY — DX: Retention of urine, unspecified: R33.9

## 2016-01-24 HISTORY — DX: Urinary tract infection, site not specified: N39.0

## 2016-01-24 HISTORY — DX: Rheumatoid arthritis, unspecified: M06.9

## 2016-01-24 HISTORY — DX: Blindness, one eye, unspecified eye: H54.40

## 2016-01-24 HISTORY — DX: Insomnia, unspecified: G47.00

## 2016-01-24 HISTORY — DX: Other voice and resonance disorders: R49.8

## 2016-01-24 HISTORY — DX: Other chronic pain: G89.29

## 2016-01-24 HISTORY — DX: Age-related osteoporosis without current pathological fracture: M81.0

## 2016-01-24 LAB — BASIC METABOLIC PANEL
ANION GAP: 1 — AB (ref 5–15)
BUN: 39 mg/dL — ABNORMAL HIGH (ref 6–20)
CALCIUM: 8.5 mg/dL — AB (ref 8.9–10.3)
CO2: 40 mmol/L — ABNORMAL HIGH (ref 22–32)
Chloride: 105 mmol/L (ref 101–111)
Creatinine, Ser: <0.3 mg/dL — ABNORMAL LOW (ref 0.61–1.24)
Glucose, Bld: 89 mg/dL (ref 65–99)
POTASSIUM: 4.2 mmol/L (ref 3.5–5.1)
Sodium: 146 mmol/L — ABNORMAL HIGH (ref 135–145)

## 2016-01-24 LAB — CBC WITH DIFFERENTIAL/PLATELET
BASOS ABS: 0 10*3/uL (ref 0.0–0.1)
BASOS PCT: 0 %
EOS PCT: 1 %
Eosinophils Absolute: 0.1 10*3/uL (ref 0.0–0.7)
HCT: 30.1 % — ABNORMAL LOW (ref 39.0–52.0)
Hemoglobin: 8.2 g/dL — ABNORMAL LOW (ref 13.0–17.0)
LYMPHS PCT: 8 %
Lymphs Abs: 0.8 10*3/uL (ref 0.7–4.0)
MCH: 26.8 pg (ref 26.0–34.0)
MCHC: 27.2 g/dL — ABNORMAL LOW (ref 30.0–36.0)
MCV: 98.4 fL (ref 78.0–100.0)
MONO ABS: 0.4 10*3/uL (ref 0.1–1.0)
Monocytes Relative: 3 %
NEUTROS ABS: 9.7 10*3/uL — AB (ref 1.7–7.7)
Neutrophils Relative %: 88 %
PLATELETS: 314 10*3/uL (ref 150–400)
RBC: 3.06 MIL/uL — AB (ref 4.22–5.81)
RDW: 18.7 % — AB (ref 11.5–15.5)
WBC: 11 10*3/uL — AB (ref 4.0–10.5)

## 2016-01-24 LAB — I-STAT TROPONIN, ED
TROPONIN I, POC: 0 ng/mL (ref 0.00–0.08)
TROPONIN I, POC: 0 ng/mL (ref 0.00–0.08)
Troponin i, poc: 0 ng/mL (ref 0.00–0.08)

## 2016-01-24 LAB — I-STAT ARTERIAL BLOOD GAS, ED
ACID-BASE EXCESS: 14 mmol/L — AB (ref 0.0–2.0)
Bicarbonate: 40.3 mEq/L — ABNORMAL HIGH (ref 20.0–24.0)
O2 SAT: 86 %
PH ART: 7.433 (ref 7.350–7.450)
TCO2: 42 mmol/L (ref 0–100)
pCO2 arterial: 60 mmHg (ref 35.0–45.0)
pO2, Arterial: 51 mmHg — ABNORMAL LOW (ref 80.0–100.0)

## 2016-01-24 LAB — CULTURE, BLOOD (ROUTINE X 2)
CULTURE: NO GROWTH
Culture: NO GROWTH

## 2016-01-24 LAB — BRAIN NATRIURETIC PEPTIDE: B Natriuretic Peptide: 521.6 pg/mL — ABNORMAL HIGH (ref 0.0–100.0)

## 2016-01-24 LAB — TROPONIN I

## 2016-01-24 MED ORDER — IPRATROPIUM-ALBUTEROL 0.5-2.5 (3) MG/3ML IN SOLN
RESPIRATORY_TRACT | Status: AC
  Administered 2016-01-24: 3 mL via RESPIRATORY_TRACT
  Filled 2016-01-24: qty 3

## 2016-01-24 MED ORDER — IPRATROPIUM-ALBUTEROL 0.5-2.5 (3) MG/3ML IN SOLN
3.0000 mL | Freq: Four times a day (QID) | RESPIRATORY_TRACT | Status: DC
  Administered 2016-01-25 – 2016-01-26 (×7): 3 mL via RESPIRATORY_TRACT
  Filled 2016-01-24 (×7): qty 3

## 2016-01-24 MED ORDER — FERROUS SULFATE 300 (60 FE) MG/5ML PO SYRP
300.0000 mg | ORAL_SOLUTION | Freq: Two times a day (BID) | ORAL | Status: DC
  Administered 2016-01-24 – 2016-01-26 (×4): 300 mg
  Filled 2016-01-24 (×6): qty 5

## 2016-01-24 MED ORDER — ANTISEPTIC ORAL RINSE SOLUTION (CORINZ)
7.0000 mL | Freq: Four times a day (QID) | OROMUCOSAL | Status: DC
  Administered 2016-01-24 – 2016-01-26 (×6): 7 mL via OROMUCOSAL

## 2016-01-24 MED ORDER — PRO-STAT SUGAR FREE PO LIQD
30.0000 mL | Freq: Two times a day (BID) | ORAL | Status: DC
  Administered 2016-01-24 – 2016-01-26 (×4): 30 mL via ORAL
  Filled 2016-01-24 (×7): qty 30

## 2016-01-24 MED ORDER — SENNOSIDES 8.8 MG/5ML PO SYRP
8.6000 mg | ORAL_SOLUTION | Freq: Two times a day (BID) | ORAL | Status: DC
  Filled 2016-01-24: qty 5

## 2016-01-24 MED ORDER — VANCOMYCIN HCL 500 MG IV SOLR
500.0000 mg | Freq: Two times a day (BID) | INTRAVENOUS | Status: DC
  Administered 2016-01-24 – 2016-01-26 (×4): 500 mg via INTRAVENOUS
  Filled 2016-01-24 (×6): qty 500

## 2016-01-24 MED ORDER — GABAPENTIN 300 MG PO CAPS
300.0000 mg | ORAL_CAPSULE | Freq: Three times a day (TID) | ORAL | Status: DC
  Administered 2016-01-24 – 2016-01-26 (×5): 300 mg via ORAL
  Filled 2016-01-24 (×8): qty 1

## 2016-01-24 MED ORDER — FENTANYL CITRATE (PF) 100 MCG/2ML IJ SOLN
50.0000 ug | Freq: Once | INTRAMUSCULAR | Status: AC
  Administered 2016-01-24: 50 ug via INTRAVENOUS
  Filled 2016-01-24: qty 2

## 2016-01-24 MED ORDER — IPRATROPIUM-ALBUTEROL 0.5-2.5 (3) MG/3ML IN SOLN
3.0000 mL | Freq: Four times a day (QID) | RESPIRATORY_TRACT | Status: DC
  Filled 2016-01-24: qty 3

## 2016-01-24 MED ORDER — ENOXAPARIN SODIUM 40 MG/0.4ML ~~LOC~~ SOLN
40.0000 mg | Freq: Every day | SUBCUTANEOUS | Status: DC
  Administered 2016-01-24 – 2016-01-25 (×2): 40 mg via SUBCUTANEOUS
  Filled 2016-01-24 (×4): qty 0.4

## 2016-01-24 MED ORDER — FAMOTIDINE 40 MG/5ML PO SUSR
20.0000 mg | Freq: Two times a day (BID) | ORAL | Status: DC
  Administered 2016-01-24 – 2016-01-26 (×4): 20 mg
  Filled 2016-01-24 (×6): qty 2.5

## 2016-01-24 MED ORDER — PROTEIN POWD: 1.0000 | Freq: Four times a day (QID) | Status: DC

## 2016-01-24 MED ORDER — SENNOSIDES 8.8 MG/5ML PO SYRP
5.0000 mL | ORAL_SOLUTION | Freq: Two times a day (BID) | ORAL | Status: DC
  Administered 2016-01-24 – 2016-01-26 (×4): 5 mL
  Filled 2016-01-24 (×6): qty 5

## 2016-01-24 MED ORDER — CHLORHEXIDINE GLUCONATE 0.12% ORAL RINSE (MEDLINE KIT)
15.0000 mL | Freq: Two times a day (BID) | OROMUCOSAL | Status: DC
  Administered 2016-01-24 – 2016-01-26 (×4): 15 mL via OROMUCOSAL

## 2016-01-24 MED ORDER — IPRATROPIUM-ALBUTEROL 0.5-2.5 (3) MG/3ML IN SOLN
3.0000 mL | Freq: Four times a day (QID) | RESPIRATORY_TRACT | Status: DC
  Administered 2016-01-24: 3 mL via RESPIRATORY_TRACT

## 2016-01-24 MED ORDER — FUROSEMIDE 10 MG/ML IJ SOLN
40.0000 mg | Freq: Once | INTRAMUSCULAR | Status: AC
  Administered 2016-01-24: 40 mg via INTRAVENOUS
  Filled 2016-01-24: qty 4

## 2016-01-24 NOTE — ED Notes (Signed)
Family at bedside. 

## 2016-01-24 NOTE — ED Triage Notes (Signed)
Received pt from [facility] with c/o 15-20 mins of unresponsiveness and bradycardia. Pt seen here last Sunday for decrease level of consciousness and had a CO2 of 155. At that time pt was diagnosed with HCAP. Pt presents with Size 6 cuff shiley, TLC in right jugular, 16 F foley and peg tube. Pt also appears to c/o pain to B/L hands and has edema to extremities.

## 2016-01-24 NOTE — ED Notes (Signed)
Attempted report x 2 

## 2016-01-24 NOTE — H&P (Signed)
Date: 01/24/2016               Patient Name:  Andrew Reynolds MRN: [id number]  DOB: [birth year] Age / Sex: 70 y.o., male   PCP: No Pcp Per Patient         Medical Service: Internal Medicine Teaching Service         Attending Physician: Dr. Tyson Alias, MD    First Contact: Burna Cash, MS4 Pager: [phone]  Second Contact: Dr. Thomasene Lot, MD Pager: [phone]       After Hours (After 5p/  First Contact Pager: [phone]  weekends / holidays): Second Contact Pager: [phone]   Chief Complaint: Altered mental status, shortness of breath and bradycardia  History of Present Illness: Mr. 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 Importantly, the patient was recently discharged on 01/22/2016 from [facility] for septic shock secondary to coag-negative staph bacteremia currently treated on vancomycin. Following discharge on 01/22/2016 the patient was at [facility] for long-term acute care but this morning the patient became altered and had increased oxygen requirements.    Upon arrival via transport by EMS the patient was on 100% FiO2 with saturations of 100%. At that time the patient was afebrile and normotensive. ABG in the emergency department showed a pH of 7.43, PO2 51, PCO2 60.0. The patient had a chest x-ray which demonstrated moderate effusions, pulmonary edema/posterior layering pleural fluid.  At 1530 the patient was evaluated by the critical care team. The critical care team recommended diuresis with furosemide and admission to stepdown due to increased oxygen requirement, fluid overload, elevated BNP and bilateral pleural effusions. The patient was started on 40 mg IV  Lasix.  Patient complained of chest pain on examination in the emergency department. He had no other additional acute concerns or complaints.   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 Additionally, they indicated they believe the patient would be better served on a non-teaching service. This comes in the setting of multiple hospital admissions and frustration with the patient's care in general.  Meds:  Current Meds  Medication Sig  . acetaminophen (TYLENOL) 325 MG tablet Place 325 mg into feeding tube every 8 (eight) hours as needed (for pain).   Marland Kitchen acetaZOLAMIDE (DIAMOX) 250 MG tablet Place 250 mg into feeding tube every 12 (twelve) hours. FOR FLUID RETENTION  . chlorhexidine (PERIDEX) 0.12 % solution Use as directed 15 mLs in the mouth or throat 2 (two) times daily. FOR TRACH  . famotidine (PEPCID) 40 MG/5ML suspension Place 2.5 mLs (20 mg total) into feeding tube 2 (two) times daily.  . ferrous sulfate 300 (60 Fe) MG/5ML syrup Place 300 mg into feeding tube 2 (two) times daily.  Marland Kitchen gabapentin (NEURONTIN) 100 MG capsule Place 3 capsules (300 mg total) into feeding tube 3 (three) times daily. FOR PAIN  . ipratropium-albuterol (DUONEB) 0.5-2.5 (3) MG/3ML SOLN Take 3 mLs by nebulization every 6 (six) hours. RELATED TO ACUTE AND CHRONIC RESPIRATORY FAILURE, UNSPECIFIED WHETHER WITH HYPOXIA OR HYPERCAPNIA  . LACTOBACILLUS RHAMNOSUS, GG, PO Take 1 capsule by mouth every 12 (twelve) hours.  . metoprolol tartrate (LOPRESSOR) 25 MG tablet Place 0.5 tablets (12.5 mg total) into feeding tube 2 (two) times daily. FOR A-FIB/HOLD  FOR PULSE LESS THAN 55 OR SBP <90  . potassium chloride 20 MEQ/15ML (10%) SOLN Place 20 mEq into feeding tube every morning.  . Protein POWD Place 1 scoop into feeding tube every 6 (six) hours. FOR WOUND HEALING  . Sennosides (SENNA) 8.8 MG/5ML SYRP Place 8.6 mg into feeding tube 2 (two) times daily. FOR CONSTIPATION  . vancomycin 500 mg in  sodium chloride 0.9 % 100 mL Inject 500 mg into the vein every 12 (twelve) hours.     Allergies: Allergies as of 01/24/2016 - Review Complete 01/24/2016  Allergen Reaction Noted  . Sulfa antibiotics Rash 10/30/2015   Past Medical History:  Diagnosis Date  . A-fib (HCC)   . Acute embolism and thrombosis of other specified deep vein of lower extremity, bilateral (HCC)   . Anemia, unspecified   . Blind right eye   . Chronic pain   . Constipation   . 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 Dysphagia, oropharyngeal phase   . Essential (primary) hypertension   . GERD (gastroesophageal reflux disease)   . History of falling   . Hypokalemia   . Insomnia, unspecified   . Muscle weakness   . Osteoporosis   . Other voice and resonance disorders   . Pleural effusion   . Pneumonia   . Pressure ulcer of buttock, unstageable (HCC)    Right  . Quadriplegia, C1-C4 complete (HCC)   . Respiratory failure, acute and chronic (HCC)   . Rheumatoid arthritis (HCC)   . Unspecified protein-calorie malnutrition (HCC)   . Urinary tract infection, site not specified   . Urine retention     Family History: Not able to be obtained  Social History: Former smoker  Review of Systems: A complete ROS was negative except as per HPI.   Physical Exam: Blood pressure 96/63, pulse 88, temperature 97.6 F (36.4 C), resp. rate (!) 28, SpO2 92 %. Physical Exam  Constitutional: He is oriented to person, place, and time.  Trach and PEG dependent.  HENT:  Trach in place  Cardiovascular: Normal rate.  Exam reveals no gallop and no friction rub.   No murmur heard. Irregularly irregular rhythm  Respiratory:  Bilateral coarse breath sounds during pulmonary examination  GI: Soft. He exhibits distension. There is no tenderness.  Musculoskeletal: He exhibits no edema.  Right hand in stocking glove left hand contracted. Bilateral 1+ pitting edema  Neurological: He is alert and oriented to  person, place, and time.  Skin:  Patient's family reported he has multiple pressure ulcers on his back we're unable to visualize these as we're unable to move the patient     EKG: Atrial fibrillation  CXR:  1. Moderate effusions. Hazy central lower lung zone opacity suggesting a combination of pulmonary edema and opacity from posterior layering pleural fluid. Pneumonia is possible. 2. Findings are similar to prior study allowing for differences in patient positioning. 3. Support apparatus is stable and well positioned.  Assessment & Plan by Problem: Active Problems:   SSS (sick sinus syndrome) (HCC)   Acute on chronic respiratory failure with hypoxemia (HCC)   Coagulase negative Staphylococcus bacteremia   ESBL E. coli carrier   Quadriplegia and quadriparesis (HCC)   Hypoxia  1. Acute on Chronic hypoxic and hypercarbic ventilatory dependent respiratory failure -- Patient presented with increased hypoxia (76%) the differential diagnosis includes increase in pulmonary edema, mucous plugging, atelectasis, COPD, pneumonia or other etiologies. -- ABG pH 7.433, pCO2 60.0, HCO3 40, this ABG seems indicative  of respiratory acidosis with metabolic compensation -- Difficult to identify the etiology of the patient's change in mental status and acute respiratory failure given complicated history of recent hospitalizations, pulmonary infections, bacteremia and urinary tract infections currently on vancomycin IV -- Continue ventilatory support -- Lasix 40 mg IV 1 for pulmonary edema -- Followed by the critical care team -- Consider consult to otolaryngology for trach size adjustments if needed  VENTILATOR SETTINGS: Vent Mode: PRVC FiO2 (%):  100 % Set Rate:  28 bmp Vt Set: 500 mL PEEP: 10 cmH20 Plateau Pressure: 32 cmH20  2. Coag-negative staph bacteremia -- Patient recently admitted for septic shock secondary to coagulase-negative staph bacteremia -- At this admission he was also  treated for ventilatory associated pneumonia with respiratory cultures that grew Serratia and Pseudomonas. He was treated with carbapenem for this infection and completed this course while in the hospital. -- Started on IV vancomycin on 01/16/16 --  Will need to complete a two-week course of vancomycin -- Stop date 01/30/16  3.Atrial fibrillation -- Discontinue Lopressor secondary to hypotension and bradycardia in the setting of increased diuresis with Lasix 40 mg  4. Chest Pain -- Patient bradycardic at time of admission. Additionally, when interviewing the patient he did complain of mild chest pain. Cardiac ischemia could be a potential cause of the patient's bradycardia. Additionally, patient has a history of sick sinus syndrome which could also cause his bradycardia. -- EKG demonstrated atrial fibrillation -- Troponin ordered will follow-up with results  5. PEG -- Continue tube feeds -- Normal PEG care  6. Multiple decubitus ulcers -- Wound care by nursing staff   7. DVT/PE prophylaxis -- Lovenox 40 mg subcutaneous injection once daily  Dispo: Admit patient to Observation with expected length of stay less than 2 midnights.  Signed: Thomasene Lot, MD 01/24/2016, 4:59 PM  Pager: [phone]

## 2016-01-24 NOTE — Progress Notes (Signed)
RT NOTE:  Pt transported to 2M06 without event. Report given to [place], RT. Backup trach from [facility] sent with patient.

## 2016-01-24 NOTE — Progress Notes (Signed)
Called from RN about the patient. RT had to increase his FIO2 to 100% and PEEP at 10. Per RN, none of the step down units would take him and he has to go to the ICU. Discussed with Dr. Sherene Sires. He is agreeable to admission to the ICU with Dr. Tyson Alias as the attending. Will put new admission order. We have already done the H&P for this patient.

## 2016-01-24 NOTE — Progress Notes (Signed)
Panic results RBV to EDP. CO2 60. No changes at this time. Consulting CCM. Will cont to monitor

## 2016-01-24 NOTE — ED Notes (Signed)
Attempted report 3rd time to charge RN phone per [facility] and no answer.

## 2016-01-24 NOTE — ED Notes (Signed)
Admitting Dr at bedside

## 2016-01-24 NOTE — ED Notes (Signed)
Attempted report 

## 2016-01-24 NOTE — ED Provider Notes (Signed)
[facility]-EMERGENCY DEPT Provider Note   CSN: [id number] Arrival date & time: 01/24/16  1043  First Provider Contact:  First MD Initiated Contact with Patient 01/24/16 1049      History   Chief Complaint Chief Complaint  Patient presents with  . Loss of Consciousness  . Bradycardia    HPI Deveon Kisiel is a 70 y.o. male.  The history is provided by the EMS personnel and the nursing home. No language interpreter was used.  Shortness of Breath  This is a recurrent problem. The problem occurs rarely.The current episode started 3 to 5 hours ago. The problem has been gradually improving. Pertinent negatives include no fever, no wheezing and no vomiting. The treatment provided moderate relief. He has had prior hospitalizations. He has had prior ED visits. He has had prior ICU admissions. Associated medical issues include chronic lung disease.   Level V caveat due to patient's condition of trach dependent, nonverbal.  Past Medical History:  Diagnosis Date  . A-fib (HCC)   . Acute embolism and thrombosis of other specified deep vein of lower extremity, bilateral (HCC)   . Anemia, unspecified   . Blind right eye   . Chronic pain   . Constipation   . 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 Dysphagia, oropharyngeal phase   . Essential (primary) hypertension   . GERD (gastroesophageal reflux disease)   . History of falling   . Hypokalemia   . Insomnia, unspecified   . Muscle weakness   . Osteoporosis   . Other voice and resonance disorders   . Pleural effusion   . Pneumonia   . Pressure ulcer of buttock, unstageable (HCC)    Right  . Quadriplegia, C1-C4 complete (HCC)   . Respiratory failure, acute and chronic (HCC)   . Rheumatoid arthritis (HCC)   . Unspecified protein-calorie malnutrition (HCC)   . Urinary tract infection, site not specified   . Urine retention     Patient Active Problem List   Diagnosis Date Noted  . Hypoxia 01/24/2016  . VAP  (ventilator-associated pneumonia) (HCC)   . Tracheostomy status (HCC)   . Quadriplegia and quadriparesis (HCC)   . Respiratory failure (HCC) 01/19/2016  . Enterococcal septicemia (HCC) 01/19/2016  . SSS (sick sinus syndrome) (HCC) 01/19/2016  . Tetraplegic (HCC) 01/19/2016  . Encounter for central line placement   . Acute on chronic respiratory failure with hypoxemia (HCC)   . Glasgow coma scale total score 3-8 (HCC)   . Coagulase negative Staphylococcus bacteremia   . Pneumonia of left lower lobe due to Pseudomonas species (HCC)   . Serratia infection   . ESBL E. coli carrier   . Pressure ulcer 01/17/2016  . Septic shock (HCC) 01/16/2016    Past Surgical History:  Procedure Laterality Date  . TRACHEOSTOMY         Home Medications    Prior to Admission medications   Medication Sig Start Date End Date Taking? Authorizing Provider  acetaminophen (TYLENOL) 325 MG tablet Place 325 mg into feeding tube every 8 (eight) hours as needed (for pain).    Yes Historical Provider, MD  acetaZOLAMIDE (DIAMOX) 250 MG tablet Place 250 mg into feeding tube every 12 (twelve) hours. FOR FLUID RETENTION   Yes Historical Provider, MD  chlorhexidine (PERIDEX) 0.12 % solution Use as directed 15 mLs in the mouth or throat 2 (two) times daily. FOR TRACH   Yes Historical Provider, MD  famotidine (PEPCID) 40 MG/5ML suspension Place 2.5 mLs (20 mg  total) into feeding tube 2 (two) times daily. 01/22/16  Yes Jeanella Craze, NP  ferrous sulfate 300 (60 Fe) MG/5ML syrup Place 300 mg into feeding tube 2 (two) times daily.   Yes Historical Provider, MD  gabapentin (NEURONTIN) 100 MG capsule Place 3 capsules (300 mg total) into feeding tube 3 (three) times daily. FOR PAIN 01/22/16  Yes Jeanella Craze, NP  ipratropium-albuterol (DUONEB) 0.5-2.5 (3) MG/3ML SOLN Take 3 mLs by nebulization every 6 (six) hours. RELATED TO ACUTE AND CHRONIC RESPIRATORY FAILURE, UNSPECIFIED WHETHER WITH HYPOXIA OR HYPERCAPNIA   Yes  Historical Provider, MD  LACTOBACILLUS RHAMNOSUS, GG, PO Take 1 capsule by mouth every 12 (twelve) hours.   Yes Historical Provider, MD  metoprolol tartrate (LOPRESSOR) 25 MG tablet Place 0.5 tablets (12.5 mg total) into feeding tube 2 (two) times daily. FOR A-FIB/HOLD FOR PULSE LESS THAN 55 OR SBP <90 01/22/16  Yes Jeanella Craze, NP  potassium chloride 20 MEQ/15ML (10%) SOLN Place 20 mEq into feeding tube every morning.   Yes Historical Provider, MD  Protein POWD Place 1 scoop into feeding tube every 6 (six) hours. FOR WOUND HEALING   Yes Historical Provider, MD  Sennosides (SENNA) 8.8 MG/5ML SYRP Place 8.6 mg into feeding tube 2 (two) times daily. FOR CONSTIPATION   Yes Historical Provider, MD  vancomycin 500 mg in sodium chloride 0.9 % 100 mL Inject 500 mg into the vein every 12 (twelve) hours. 01/23/16  Yes Jeanella Craze, NP  Amino Acids-Protein Hydrolys (FEEDING SUPPLEMENT, PRO-STAT SUGAR FREE 64,) LIQD Place 30 mLs into feeding tube daily. Patient not taking: Reported on 01/24/2016 01/22/16   Jeanella Craze, NP  collagenase (SANTYL) ointment Apply topically daily. Apply Santyl to right buttock wounds Q day, then cover with moist fluffed 2X2 and foam dressing.  (Change foam dressings Q 5 days or PRN soiling.) Patient not taking: Reported on 01/24/2016 01/22/16   Jeanella Craze, NP  Nutritional Supplements (FEEDING SUPPLEMENT, VITAL AF 1.2 CAL,) LIQD Place 1,000 mLs into feeding tube continuous. Patient not taking: Reported on 01/24/2016 01/22/16   Jeanella Craze, NP  Water For Irrigation, Sterile (FREE WATER) SOLN Place 250 mLs into feeding tube every 4 (four) hours. Patient not taking: Reported on 01/24/2016 01/22/16   Jeanella Craze, NP    Family History No family history on file.  Social History Social History  Substance Use Topics  . Smoking status: Not on file  . Smokeless tobacco: Not on file  . Alcohol use Not on file     Allergies   Sulfa antibiotics   Review of Systems Review  of Systems  Unable to perform ROS: Patient nonverbal  Constitutional: Negative for fever.  Respiratory: Positive for shortness of breath. Negative for wheezing.   Gastrointestinal: Negative for vomiting.     Physical Exam Updated Vital Signs BP 102/67   Pulse 78   Temp 97.6 F (36.4 C)   Resp 24   SpO2 96%   Physical Exam  Constitutional:  Chronically trach dependent, quadriplegic  HENT:  Head: Normocephalic and atraumatic.  Eyes:  Right pupil greater than left, reactive bilaterally  Neck: No JVD present.  Cardiovascular: Normal rate.  An irregularly irregular rhythm present.  Pulmonary/Chest: No stridor. No respiratory distress. He has no wheezes. He has no rales.  Abdominal: Soft. Bowel sounds are normal. He exhibits no distension. There is no tenderness.  Musculoskeletal: He exhibits edema.  Neurological: He is alert.  Skin: Skin is warm. Capillary refill  takes less than 2 seconds.     ED Treatments / Results  Labs (all labs ordered are listed, but only abnormal results are displayed) Labs Reviewed  CBC WITH DIFFERENTIAL/PLATELET - Abnormal; Notable for the following:       Result Value   WBC 11.0 (*)    RBC 3.06 (*)    Hemoglobin 8.2 (*)    HCT 30.1 (*)    MCHC 27.2 (*)    RDW 18.7 (*)    Neutro Abs 9.7 (*)    All other components within normal limits  BASIC METABOLIC PANEL - Abnormal; Notable for the following:    Sodium 146 (*)    CO2 40 (*)    BUN 39 (*)    Creatinine, Ser <0.30 (*)    Calcium 8.5 (*)    Anion gap 1 (*)    All other components within normal limits  BRAIN NATRIURETIC PEPTIDE - Abnormal; Notable for the following:    B Natriuretic Peptide 521.6 (*)    All other components within normal limits  I-STAT ARTERIAL BLOOD GAS, ED - Abnormal; Notable for the following:    pCO2 arterial 60.0 (*)    pO2, Arterial 51.0 (*)    Bicarbonate 40.3 (*)    Acid-Base Excess 14.0 (*)    All other components within normal limits  I-STAT TROPOININ, ED    I-STAT TROPOININ, ED    EKG  EKG Interpretation  Date/Time:  Sunday January 24 2016 10:49:42 EDT Ventricular Rate:  89 PR Interval:    QRS Duration: 94 QT Interval:  386 QTC Calculation: 462 R Axis:   48 Text Interpretation:  Atrial fibrillation Ventricular premature complex Low voltage, extremity and precordial leads Confirmed by GOLDSTON MD, SCOTT [phone]) on 01/24/2016 10:53:47 AM       Radiology Dg Chest Portable 1 View  Result Date: 01/24/2016 CLINICAL DATA:  Hypoxia. Received pt from [facility] with c/o 15-20 mins of unresponsiveness and bradycardia. Pt seen here last Sunday for decrease level of consciousness and had a CO2 of 155. At that time pt was diagnosed with HCAP. Pt unable to give hx at this time. EXAM: PORTABLE CHEST 1 VIEW COMPARISON:  01/13/2016 FINDINGS: Moderate bilateral pleural effusions obscure the hemidiaphragms and most of the heart borders. There is central hazy opacity that is likely combination of pulmonary edema and layering pleural fluid on this semi supine exam. Cardiac silhouette is grossly normal in size. No mediastinal or hilar masses. Tracheostomy tube and right internal jugular central venous line are stable in well positioned. No gross pneumothorax. IMPRESSION: 1. Moderate effusions. Hazy central lower lung zone opacity suggesting a combination of pulmonary edema and opacity from posterior layering pleural fluid. Pneumonia is possible. 2. Findings are similar to prior study allowing for differences in patient positioning. 3. Support apparatus is stable and well positioned. Electronically Signed   By: Amie Portland M.D.   On: 01/24/2016 11:29    Procedures Procedures (including critical care time)  Medications Ordered in ED Medications  ipratropium-albuterol (DUONEB) 0.5-2.5 (3) MG/3ML nebulizer solution 3 mL (not administered)  vancomycin (VANCOCIN) 500 mg in sodium chloride 0.9 % 100 mL IVPB (not administered)  fentaNYL (SUBLIMAZE) injection  50 mcg (50 mcg Intravenous Given 01/24/16 1239)  furosemide (LASIX) injection 40 mg (40 mg Intravenous Given 01/24/16 1536)     Initial Impression / Assessment and Plan / ED Course  I have reviewed the triage vital signs and the nursing notes.  Pertinent labs & imaging results that  were available during my care of the patient were reviewed by me and considered in my medical decision making (see chart for details).  Clinical Course    Patient presents from [facility] via EMS for evaluation of hypoxia to 76%, bradycardia to 40s. He is on 50% FiO2 at home through chronic trach. He arrives the EMS on 100% FiO2 with saturations of 100%. Patient with 1+ pitting edema in his bilateral upper lower extremities. Course breath sounds throughout. Patient without fever or hypotension.  Concern for pulmonary edema, worsening pleural effusion.  ABG with pH 7.43, PO2 51, PCO2 60.0. Chest x-ray with moderate effusions, pulmonary edema/posterior layering pleural fluid.  Fentanyl for pain while in emergency department.  Discussed case with critical care team who will see patient in emergency department. Suspect symptoms related to fluid overload from recent hospitalization.  3:25 PM: Critical care evaluate patient, recommended Lasix, stepdown admission to diuresis due to increased oxygen requirement, fluid overloaded on exam, elevated BNP, bilateral pleural effusions. 40 mg IV Lasix ordered. Critical care team ordered bilateral lower from the [facility]. Discuss with family medicine teaching service who will admit patient to stepdown unit.  Discussed w/ Dr. Criss Alvine.    Final Clinical Impressions(s) / ED Diagnoses   Final diagnoses:  Shortness of breath  Hypoxia  Pleural effusion    New Prescriptions New Prescriptions   No medications on file     Dan Humphreys, MD 01/24/16 1601    Pricilla Loveless, MD 01/30/16 [phone]

## 2016-01-24 NOTE — Consult Note (Signed)
PULMONARY / CRITICAL CARE MEDICINE   Name: Andrew Reynolds MRN: [id number] DOB: 1946/01/27    ADMISSION DATE:  01/24/2016 CONSULTATION DATE:  8/13  REFERRING MD:  EDP  CHIEF COMPLAINT: Bradycardia and decreased loc  HISTORY OF PRESENT ILLNESS:   Mr. Grow is an unfortunate 70 yo male who fell and suffered cervical cord damage ,complicated by anticoagulation resulting in 3 cervical surgeries. The surgeries left him with severe quadriparesis with limited use of his extremities. He is now trach and Peg dependent. Just discharged from [facility] back to  [facility] following treatment for sepsis from Serratia / Pseudomonas pna and esbl uti. He returns to ED with bradycardia and decreased LOC. He is awake and conversant. HD stable. Lungs are coarse with good air movement. He is being treated for recent infections. Troponin's are negative. Difficult to say what triggered today's episode. Suspect he can return to [facility] at this time.  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  PAST SURGICAL HISTORY: He  has a past surgical history that includes Tracheostomy.  Allergies  Allergen Reactions  . Sulfa Antibiotics Rash    No current  facility-administered medications on file prior to encounter.    Current Outpatient Prescriptions on File Prior to Encounter  Medication Sig  . acetaminophen (TYLENOL) 325 MG tablet Place 325 mg into feeding tube every 8 (eight) hours as needed (for pain).   Marland Kitchen acetaZOLAMIDE (DIAMOX) 250 MG tablet Place 250 mg into feeding tube every 12 (twelve) hours. FOR FLUID RETENTION  . Amino Acids-Protein Hydrolys (FEEDING SUPPLEMENT, PRO-STAT SUGAR FREE 64,) LIQD Place 30 mLs into feeding tube daily.  . chlorhexidine (PERIDEX) 0.12 % solution Use as directed 15 mLs in the mouth or throat 2 (two) times daily. FOR TRACH  . collagenase (SANTYL) ointment Apply topically daily. Apply Santyl to right buttock wounds Q day, then cover with moist fluffed 2X2 and foam dressing.  (Change foam dressings Q 5 days or PRN soiling.)  . famotidine (PEPCID) 40 MG/5ML suspension Place 2.5 mLs (20 mg total) into feeding tube 2 (two) times daily.  . ferrous sulfate 300 (60 Fe) MG/5ML syrup Place 300 mg into feeding tube 2 (two) times daily.  Marland Kitchen gabapentin (NEURONTIN) 100 MG capsule Place 3 capsules (300 mg total) into feeding tube 3 (three) times daily. FOR PAIN  . ipratropium-albuterol (DUONEB) 0.5-2.5 (3) MG/3ML SOLN Take 3 mLs by nebulization every 6 (six) hours. RELATED TO ACUTE AND CHRONIC RESPIRATORY FAILURE, UNSPECIFIED WHETHER WITH HYPOXIA OR HYPERCAPNIA  . metoprolol tartrate (LOPRESSOR) 25 MG tablet Place 0.5 tablets (12.5 mg total) into feeding tube 2 (two) times daily. FOR A-FIB/HOLD FOR PULSE LESS THAN 55 OR SBP <90  . Nutritional Supplements (FEEDING SUPPLEMENT, VITAL AF 1.2 CAL,) LIQD Place 1,000 mLs into feeding tube continuous.  . potassium chloride 20 MEQ/15ML (10%) SOLN Place 20 mEq into feeding tube every morning.  . Probiotic Product (PROBIOTIC PO) Place 1 tablet into feeding tube 2 (two)  times daily.  . Protein POWD Place 1 scoop into feeding tube every 6 (six) hours. FOR WOUND HEALING  . Sennosides (SENNA)  8.8 MG/5ML SYRP Place 8.6 mg into feeding tube 2 (two) times daily. FOR CONSTIPATION  . vancomycin 500 mg in sodium chloride 0.9 % 100 mL Inject 500 mg into the vein every 12 (twelve) hours.  . Water For Irrigation, Sterile (FREE WATER) SOLN Place 250 mLs into feeding tube every 4 (four) hours.    FAMILY HISTORY:  His has no family status information on file.    SOCIAL HISTORY: Former smoker REVIEW OF SYSTEMS:   NA  SUBJECTIVE:  NAD currently  VITAL SIGNS: BP 109/66   Pulse 79   Temp 97.6 F (36.4 C)   Resp 25   SpO2 96%   HEMODYNAMICS:    VENTILATOR SETTINGS: Vent Mode: PRVC FiO2 (%):  [60 %] 60 % Set Rate:  [28 bmp] 28 bmp Vt Set:  [500 mL] 500 mL PEEP:  [10 cmH20] 10 cmH20 Plateau Pressure:  [32 cmH20] 32 cmH20  INTAKE / OUTPUT: No intake/output data recorded.  PHYSICAL EXAMINATION: General: Tall wm in no distress Neuro:  Follows commands, moves arms and toes to command weakly HEENT: #6 trach in place Cardiovascular:  HSD RRR Lungs:  Coarse rhonchi with decreased bs bases r>l Abdomen:  PEG, + bs Musculoskeletal:  Intact Skin:  Warm/dry. lle with increased edema  LABS:  BMET  Recent Labs Lab 01/21/16 0530 01/21/16 1733 01/24/16 1130  NA 152* 147* 146*  K 4.4 3.7 4.2  CL 108 106 105  CO2 38* 38* 40*  BUN 53* 52* 39*  CREATININE 0.48* 0.42* <0.30*  GLUCOSE 140* 126* 89    Electrolytes  Recent Labs Lab 01/18/16 1737 01/19/16 0500 01/19/16 1807  01/21/16 0530 01/21/16 1733 01/24/16 1130  CALCIUM  --  8.1*  --   < > 8.3* 8.1* 8.5*  MG 2.6* 2.6* 2.5*  --   --   --   --   PHOS 4.4 4.3 4.4  --   --   --   --   < > = values in this interval not displayed.  CBC  Recent Labs Lab 01/21/16 0530 01/22/16 0506 01/24/16 1130  WBC 14.3* 13.7* 11.0*  HGB 7.8* 7.9* 8.2*  HCT 28.0* 27.9* 30.1*  PLT 286 282 314    Coag's No results for input(s): APTT, INR in the last 168 hours.  Sepsis Markers No results for input(s): LATICACIDVEN,  PROCALCITON, O2SATVEN in the last 168 hours.  ABG  Recent Labs Lab 01/24/16 1209  PHART 7.433  PCO2ART 60.0*  PO2ART 51.0*    Liver Enzymes No results for input(s): AST, ALT, ALKPHOS, BILITOT, ALBUMIN in the last 168 hours.  Cardiac Enzymes No results for input(s): TROPONINI, PROBNP in the last 168 hours.  Glucose  Recent Labs Lab 01/21/16 1539 01/21/16 1932 01/22/16 0014 01/22/16 0407 01/22/16 0727 01/22/16 1150  GLUCAP 120* 117* 125* 118* 138* 116*    Imaging Dg Chest Portable 1 View  Result Date: 01/24/2016 CLINICAL DATA:  Hypoxia. Received pt from [facility] with c/o 15-20 mins of unresponsiveness and bradycardia. Pt seen here last Sunday for decrease level of consciousness and had a CO2 of 155. At that time pt was diagnosed with HCAP. Pt unable to give hx at this time. EXAM: PORTABLE CHEST 1 VIEW COMPARISON:  01/13/2016 FINDINGS: Moderate bilateral pleural effusions obscure the hemidiaphragms and most of the heart borders. There is central hazy opacity that  is likely combination of pulmonary edema and layering pleural fluid on this semi supine exam. Cardiac silhouette is grossly normal in size. No mediastinal or hilar masses. Tracheostomy tube and right internal jugular central venous line are stable in well positioned. No gross pneumothorax. IMPRESSION: 1. Moderate effusions. Hazy central lower lung zone opacity suggesting a combination of pulmonary edema and opacity from posterior layering pleural fluid. Pneumonia is possible. 2. Findings are similar to prior study allowing for differences in patient positioning. 3. Support apparatus is stable and well positioned. Electronically Signed   By: Amie Portland M.D.   On: 01/24/2016 11:29     STUDIES:    CULTURES:   ANTIBIOTICS: Vanc per OH  SIGNIFICANT EVENTS: 8/13 bradycardia  LINES/TUBES: 8/6 rt I j cvl>> Trach>> Foley>>  DISCUSSION: Mr. Walterscheid is an unfortunate 70 yo male who fell and suffered  cervical cord damage ,complicated by anticoagulation resulting in 3 cervical surgeries. The surgeries left him with severe quadriparesis with limited use of his extremities. He is now trach and Peg dependent. Just discharged from [facility] back to  [facility] following treatment for sepsis from Serratia / Pseudomonas pna and esbl uti. He returns to ED with bradycardia and decreased LOC. He is awake and conversant. HD stable. Lungs are coarse with good air movement. He is being treated for recent infections. Troponin's are negative. Difficult to say what triggered today's episode. Suspect he can return to [facility] at this time. 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 May need ENT consult Consider IR repeated thoracentesis Diuresis may decrease effusions   CARDIOVASCULAR A:  CAF DVT Hemodynamically stable P:  Continue diuresis as tolerated Continue lopressor  RENAL Lab Results  Component Value Date   CREATININE <0.30 (L) 01/24/2016   CREATININE 0.42 (L) 01/21/2016   CREATININE 0.48 (L) 01/21/2016    Recent Labs Lab 01/21/16 0530 01/21/16 1733 01/24/16 1130  K 4.4 3.7 4.2     Recent Labs Lab 01/21/16 0530 01/21/16 1733 01/24/16 1130  NA 152* 147* 146*    A:   Hypernatremia mild P:   Follow renal functions and lytes  GASTROINTESTINAL A:   Peg in place P:   Continue tube feeds  HEMATOLOGIC  Recent Labs  01/22/16 0506 01/24/16 1130  HGB 7.9* 8.2*     A:   Recent LUE DVT from PICC line IVC filter from previous DVT P:  Follow h/h  INFECTIOUS A:   Recent treatment Coag neg staph bacteremia currently on vanc. ESBL Klebsiella in urine 8/6 resp cul + Serratia  Marcescens and Pseudomonas A. Trweated prior [facility] DC [facility] 8/11 P:   Continue abx  ENDOCRINE CBG (last 3)   Recent Labs  01/22/16 0407 01/22/16 0727 01/22/16 1150  GLUCAP 118* 138* 116*     A:   No acute issue P:   Follow glucose  NEUROLOGIC A:   Post multiple cervical surgeries from fall while on anticoagulation resulting in quadriparesis.  He can move arms and wiggle toes but can only assist minimally in ADL's. Cognitively intact. P:   RASS goal: 0 Pain medications as needed   FAMILY  - Updates: Son and wife updated at bedside. The son is calm, intelligent but obviously frusrated  by a medical system that shuffles his father back and forth from LTAC to Acute care facility.  - Inter-disciplinary family meet or Palliative Care meeting due by:  8/20    Pulmonary and Critical Care Medicine [facility] Pager: [phone]  01/24/2016, 1:22 PM

## 2016-01-24 NOTE — Progress Notes (Addendum)
TRH to take over in AM from IMTS at family's request.  PCCM is consult and will follow for vent management as they just d/c'd to [facility] on 8/11.  Back with hypoxia and bradycardia.  Addendum:  Going to ICU  Marlin Canary DO

## 2016-01-25 ENCOUNTER — Inpatient Hospital Stay (HOSPITAL_COMMUNITY): Payer: Medicare Other

## 2016-01-25 DIAGNOSIS — R0902 Hypoxemia: Secondary | ICD-10-CM

## 2016-01-25 DIAGNOSIS — Z930 Tracheostomy status: Secondary | ICD-10-CM

## 2016-01-25 DIAGNOSIS — J80 Acute respiratory distress syndrome: Secondary | ICD-10-CM

## 2016-01-25 DIAGNOSIS — R609 Edema, unspecified: Secondary | ICD-10-CM

## 2016-01-25 DIAGNOSIS — Z7401 Bed confinement status: Secondary | ICD-10-CM

## 2016-01-25 DIAGNOSIS — Z9911 Dependence on respirator [ventilator] status: Secondary | ICD-10-CM

## 2016-01-25 DIAGNOSIS — R7881 Bacteremia: Secondary | ICD-10-CM

## 2016-01-25 DIAGNOSIS — M7989 Other specified soft tissue disorders: Secondary | ICD-10-CM

## 2016-01-25 LAB — CBC WITH DIFFERENTIAL/PLATELET
BASOS ABS: 0 10*3/uL (ref 0.0–0.1)
BASOS PCT: 0 %
EOS ABS: 0 10*3/uL (ref 0.0–0.7)
EOS PCT: 0 %
HEMATOCRIT: 27.2 % — AB (ref 39.0–52.0)
Hemoglobin: 7.6 g/dL — ABNORMAL LOW (ref 13.0–17.0)
Lymphocytes Relative: 9 %
Lymphs Abs: 1 10*3/uL (ref 0.7–4.0)
MCH: 27.1 pg (ref 26.0–34.0)
MCHC: 27.9 g/dL — AB (ref 30.0–36.0)
MCV: 97.1 fL (ref 78.0–100.0)
MONO ABS: 0.3 10*3/uL (ref 0.1–1.0)
MONOS PCT: 3 %
NEUTROS ABS: 10 10*3/uL — AB (ref 1.7–7.7)
Neutrophils Relative %: 88 %
PLATELETS: 302 10*3/uL (ref 150–400)
RBC: 2.8 MIL/uL — ABNORMAL LOW (ref 4.22–5.81)
RDW: 18.5 % — AB (ref 11.5–15.5)
WBC: 11.3 10*3/uL — ABNORMAL HIGH (ref 4.0–10.5)

## 2016-01-25 LAB — POCT I-STAT 3, ART BLOOD GAS (G3+)
ACID-BASE EXCESS: 13 mmol/L — AB (ref 0.0–2.0)
Bicarbonate: 38.8 mEq/L — ABNORMAL HIGH (ref 20.0–24.0)
O2 SAT: 95 %
PCO2 ART: 59.2 mmHg — AB (ref 35.0–45.0)
PH ART: 7.425 (ref 7.350–7.450)
TCO2: 41 mmol/L (ref 0–100)
pO2, Arterial: 76 mmHg — ABNORMAL LOW (ref 80.0–100.0)

## 2016-01-25 LAB — BASIC METABOLIC PANEL
ANION GAP: 6 (ref 5–15)
BUN: 36 mg/dL — AB (ref 6–20)
CALCIUM: 8.5 mg/dL — AB (ref 8.9–10.3)
CO2: 38 mmol/L — AB (ref 22–32)
CREATININE: 0.46 mg/dL — AB (ref 0.61–1.24)
Chloride: 104 mmol/L (ref 101–111)
GFR calc Af Amer: >60 mL/min (ref >60)
GLUCOSE: 75 mg/dL (ref 65–99)
Potassium: 4 mmol/L (ref 3.5–5.1)
Sodium: 148 mmol/L — ABNORMAL HIGH (ref 135–145)

## 2016-01-25 LAB — GLUCOSE, CAPILLARY
GLUCOSE-CAPILLARY: 88 mg/dL (ref 65–99)
Glucose-Capillary: 74 mg/dL (ref 65–99)

## 2016-01-25 MED ORDER — ETOMIDATE 2 MG/ML IV SOLN
INTRAVENOUS | Status: AC
Start: 2016-01-25 — End: 2016-01-25
  Filled 2016-01-25: qty 10

## 2016-01-25 MED ORDER — SODIUM CHLORIDE 0.9 % IV SOLN
INTRAVENOUS | Status: DC
  Administered 2016-01-25 – 2016-01-26 (×2): via INTRAVENOUS

## 2016-01-25 MED ORDER — ETOMIDATE 2 MG/ML IV SOLN
20.0000 mg | Freq: Once | INTRAVENOUS | Status: AC
  Administered 2016-01-25: 20 mg via INTRAVENOUS

## 2016-01-25 MED ORDER — COLLAGENASE 250 UNIT/GM EX OINT
TOPICAL_OINTMENT | Freq: Every day | CUTANEOUS | Status: DC
  Administered 2016-01-25: 16:00:00 via TOPICAL
  Filled 2016-01-25: qty 30

## 2016-01-25 MED ORDER — ACETAMINOPHEN 160 MG/5ML PO SOLN
650.0000 mg | Freq: Four times a day (QID) | ORAL | Status: DC | PRN
  Administered 2016-01-25: 650 mg
  Filled 2016-01-25: qty 20.3

## 2016-01-25 NOTE — Progress Notes (Signed)
Patient desating in the upper 70's, moderate thick secretions obtained after being placed on 100%. Still no change in sats, RT bagged patient getting sats back in upper 80's. Patient placed back on ventilator and remained on 100% with sats stable in the 90's.

## 2016-01-25 NOTE — Progress Notes (Signed)
PULMONARY / CRITICAL CARE MEDICINE   Name: Andrew Reynolds MRN: [id number] DOB: [birth year]    ADMISSION DATE:  01/24/2016 CONSULTATION DATE:  8/13  REFERRING MD:  EDP  CHIEF COMPLAINT: Bradycardia and decreased loc  HISTORY OF PRESENT ILLNESS:   Andrew Reynolds is an unfortunate 71 yo male who fell and suffered cervical cord damage ,complicated by anticoagulation resulting in 3 cervical surgeries. The surgeries left him with severe quadriparesis with limited use of his extremities. He is now trach and Peg dependent. Just discharged from [facility] back to  [facility] following treatment for sepsis from Serratia / Pseudomonas pna and esbl uti. He returns to ED with bradycardia and decreased LOC. He is awake and conversant. HD stable. Lungs are coarse with good air movement. He is being treated for recent infections. Troponin's are negative. Difficult to say what triggered today's episode. Suspect he can return to [facility] at this time.  SUBJECTIVE:  No issues overnight.  VITAL SIGNS: BP (!) 97/49   Pulse 94   Temp 98.7 F (37.1 C) (Oral)   Resp (!) 28   Wt 102 kg (224 lb 12.8 oz)   SpO2 97%   BMI 30.49 kg/m   HEMODYNAMICS:     VENTILATOR SETTINGS: Vent Mode: PRVC FiO2 (%):  [60 %-100 %] 70 % Set Rate:  [28 bmp] 28 bmp Vt Set:  [500 mL] 500 mL PEEP:  [10 cmH20] 10 cmH20 Plateau Pressure:  [30 cmH20-37 cmH20] 30 cmH20  INTAKE / OUTPUT: I/O last 3 completed shifts: In: 210 [I.V.:110; IV Piggyback:100] Out: 1535 [Urine:1535]  PHYSICAL EXAMINATION: General: Tall wm in no distress Neuro:  Follows commands, moves arms and toes to command weakly HEENT: #6 trach in place Cardiovascular:  HSD RRR Lungs:  Coarse rhonchi with decreased bs bases r>l Abdomen:  PEG, + bs Musculoskeletal:  Intact Skin:  Warm/dry. lle with increased edema  LABS:  BMET  Recent Labs Lab 01/21/16 1733 01/24/16 1130 01/25/16 0500  NA 147* 146* 148*  K 3.7 4.2 4.0  CL 106 105 104  CO2 38*  40* 38*  BUN 52* 39* 36*  CREATININE 0.42* <0.30* 0.46*  GLUCOSE 126* 89 75   Electrolytes  Recent Labs Lab 01/18/16 1737 01/19/16 0500 01/19/16 1807  01/21/16 1733 01/24/16 1130 01/25/16 0500  CALCIUM  --  8.1*  --   < > 8.1* 8.5* 8.5*  MG 2.6* 2.6* 2.5*  --   --   --   --   PHOS 4.4 4.3 4.4  --   --   --   --   < > = values in this interval not displayed.  CBC  Recent Labs Lab 01/22/16 0506 01/24/16 1130 01/25/16 0500  WBC 13.7* 11.0* 11.3*  HGB 7.9* 8.2* 7.6*  HCT 27.9* 30.1* 27.2*  PLT 282 314 302   Coag's No results for input(s): APTT, INR in the last 168 hours.  Sepsis Markers No results for input(s): LATICACIDVEN, PROCALCITON, O2SATVEN in the last 168 hours.  ABG  Recent Labs Lab 01/24/16 1209  PHART 7.433  PCO2ART 60.0*  PO2ART 51.0*   Liver Enzymes No results for input(s): AST, ALT, ALKPHOS, BILITOT, ALBUMIN in the last 168 hours.  Cardiac Enzymes  Recent Labs Lab 01/24/16 2049  TROPONINI <0.03   Glucose  Recent Labs Lab 01/21/16 1932 01/22/16 0014 01/22/16 0407 01/22/16 0727 01/22/16 1150 01/25/16 0007  GLUCAP 117* 125* 118* 138* 116* 88   Imaging Portable Chest 1 View  Result Date: 01/24/2016 CLINICAL DATA:  Shortness of breath. EXAM: PORTABLE CHEST 1 VIEW COMPARISON:  01/24/2016 FINDINGS: Tracheostomy tube noted. Anterior cervical plate and screw fixator along with posterolateral rod and facet screws in the lower cervical spine. Right IJ line tip:  SVC. Similar to prior, there are large layering bilateral pleural effusions with associated passive atelectasis. Indistinctness of the pulmonary vasculature is present and a component of edema is not excluded. Cardiac margins are somewhat obscured but I am doubtful of significant cardiomegaly. IMPRESSION: 1. Essentially stable appearance of the chest, with layering moderate to large bilateral pleural effusions and passive atelectasis, and questionable edema but no overt cardiomegaly.  Tracheostomy tube projects over the tracheal air shadow and a right IJ line projects over the SVC. Electronically Signed   By: Gaylyn Rong M.D.   On: 01/24/2016 18:25   Dg Chest Portable 1 View  Result Date: 01/24/2016 CLINICAL DATA:  Hypoxia. Received pt from [facility] with c/o 15-20 mins of unresponsiveness and bradycardia. Pt seen here last Sunday for decrease level of consciousness and had a CO2 of 155. At that time pt was diagnosed with HCAP. Pt unable to give hx at this time. EXAM: PORTABLE CHEST 1 VIEW COMPARISON:  01/13/2016 FINDINGS: Moderate bilateral pleural effusions obscure the hemidiaphragms and most of the heart borders. There is central hazy opacity that is likely combination of pulmonary edema and layering pleural fluid on this semi supine exam. Cardiac silhouette is grossly normal in size. No mediastinal or hilar masses. Tracheostomy tube and right internal jugular central venous line are stable in well positioned. No gross pneumothorax. IMPRESSION: 1. Moderate effusions. Hazy central lower lung zone opacity suggesting a combination of pulmonary edema and opacity from posterior layering pleural fluid. Pneumonia is possible. 2. Findings are similar to prior study allowing for differences in patient positioning. 3. Support apparatus is stable and well positioned. Electronically Signed   By: Amie Portland M.D.   On: 01/24/2016 11:29   STUDIES:     CULTURES:   ANTIBIOTICS: Vanc per OH  SIGNIFICANT EVENTS: 8/13 bradycardia  LINES/TUBES: 8/6 Rt IJ CVL>> Trach>> Foley>>  DISCUSSION: Andrew Reynolds is an unfortunate 70 yo male who fell and suffered cervical cord damage ,complicated by anticoagulation resulting in 3 cervical surgeries. The surgeries left him with severe quadriparesis with limited use of his extremities. He is now trach and Peg dependent. Just discharged from [facility] back to [facility] following treatment for sepsis from Serratia / Pseudomonas pna and  esbl uti. He returns to ED with bradycardia and decreased LOC.  He is awake and conversant. HD stable. Lungs are coarse with good air movement. He is being treated for recent infections. Troponin's are negative. Difficult to say what triggered today's episode. Suspect he can return to [facility] at this time.  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 Hypoxia  P:   Titrate O2 for sat of 88-92%. Continue vent support. Change trach to cuffed distal XLT today given high leak. Diuresis may decrease effusions but BP precludes for now.  CARDIOVASCULAR A:  CAF DVT Hemodynamically stable  P:  Hold all anti-HTN Fluid resuscitate. Hold diuretics.  RENAL Lab Results  Component Value Date   CREATININE 0.46 (L) 01/25/2016   CREATININE <0.30 (L) 01/24/2016   CREATININE 0.42 (L) 01/21/2016   Recent Labs Lab 01/21/16 1733 01/24/16 1130 01/25/16 0500  K 3.7 4.2 4.0   A:  Hypernatremia mild P:   Replace electrolytes as indicated NS 100 ml/hr x24 hours. TF per nutrition.  GASTROINTESTINAL A:   Peg in place P:   Consult nutrition for TF.  HEMATOLOGIC  Recent Labs  01/24/16 1130 01/25/16 0500  HGB 8.2* 7.6*   A:   Recent LUE DVT from PICC line IVC filter from previous DVT P:  Follow H&H. Transfuse per ICU protocol.  INFECTIOUS A:   Recent treatment Coag neg staph bacteremia currently on vanc. ESBL Klebsiella in urine 8/6 resp cul + Serratia Marcescens and Pseudomonas A. Trweated prior [facility] 8/11 P:   Continue abx. ID consult called.  ENDOCRINE CBG (last 3)   Recent Labs  01/22/16 1150 01/25/16 0007  GLUCAP 116* 88   A:   No acute issue P:   Follow glucose  NEUROLOGIC A:   Post multiple cervical surgeries from fall while on anticoagulation resulting in quadriparesis.  He can move arms and wiggle  toes but can only assist minimally in ADL's. Cognitively intact. P:   RASS goal: 0 Pain medications as needed  FAMILY  - Updates: No family bedside.  - Inter-disciplinary family meet or Palliative Care meeting due by:  8/20  The patient is critically ill with multiple organ systems failure and requires high complexity decision making for assessment and support, frequent evaluation and titration of therapies, application of advanced monitoring technologies and extensive interpretation of multiple databases.   Critical Care Time devoted to patient care services described in this note is  35  Minutes. This time reflects time of care of this signee Dr Koren Bound. This critical care time does not reflect procedure time, or teaching time or supervisory time of PA/NP/Med student/Med Resident etc but could involve care discussion time.  Alyson Reedy, M.D. [facility] Pulmonary/Critical Care Medicine. Pager: [phone]. After hours pager: [phone].  01/25/2016, 9:11 AM

## 2016-01-25 NOTE — Procedures (Addendum)
Tracheostomy Change Note  Patient Details:   Name: Andrew Reynolds DOB: 1945-11-22 MRN: [id number]    Airway Documentation:     Evaluation  O2 sats: stable throughout Complications: No apparent complications Patient did tolerate procedure well. Bilateral Breath Sounds: Rhonchi   Trach exchanged via trach exchange tube by MD with assistance of this RT. #6 XLT Distal is secured in place now with no leak. RN at bedside.     Ancil Boozer 01/25/2016, 11:40AM

## 2016-01-25 NOTE — Progress Notes (Signed)
Patient sats 74% upon arrival. Patient bagged and lavaged. Small amount of thick secretions obtained. Placed back on ventilator and remains on 100%. Sats 97%.

## 2016-01-25 NOTE — Progress Notes (Signed)
VASCULAR LAB PRELIMINARY  PRELIMINARY  PRELIMINARY  PRELIMINARY  Bilateral lower extremity venous duplex has been completed.      Bilateral:  No evidence of DVT, superficial thrombosis, or Baker's Cyst.  Gave results to Lauren, RN  Jenetta Loges, RVT, RDMS 01/25/2016, 2:53 PM

## 2016-01-25 NOTE — Progress Notes (Signed)
Sputum sample obtained by RT and sent to lab by RN.

## 2016-01-25 NOTE — Consult Note (Addendum)
WOC Nurse wound consult note Pt is familiar to [facility] nurse from recent admission; refer to previous progress notes on 8/7. Several pressure injuries are fairly unchanged since that recent assessment. Reason for Consult: Sacrum had 2 wounds during the last admission to this location which were stage 3: these have merged into one unstageable pressure injury at this time; 7X6X.8cm, 80% slough, 20% dark reddish purple wound bed.  Mod amt tan drainage, no odor. Right buttock with unstageable wounds with wounds previously noted in 2 locations;  now have merged into one unstageable pressure injury:4X4cm, 10% dark reddish purple, 90% slough, mod amt tan drainage, no odor.  Left heel with deep tissue injury; 4X3cm dark red.  Left anterior foot with stage 1 pressure injury: .3X.3cm dark red Left arm with stage 1 pressure injury .2X.2cm  Pressure Ulcer POA: Yes to all sites noted Dressing procedure/placement/frequency: Foam dressing to left foot and heel protect and promote healing.  Pt has Prevalon boots to reduce pressure to BLE. Santyl to provide enzymatic debrdiement to sacrum and buttocks wounds.  No family present to discuss plan of care,  Pt is on a Sport low airloss bed to reduce pressure. Please re-consult if further assistance is needed.  Thank-you,  Cammie Mcgee MSN, RN, CWOCN, [place], CNS [phone]

## 2016-01-25 NOTE — Progress Notes (Signed)
eLink Physician-Brief Progress Note Patient Name: Andrew Reynolds DOB: [birth year] MRN: [id number]   Date of Service  01/25/2016  HPI/Events of Note  Notified by bedside nurse of patient having intermittent pain. Has PEG tube. BP borderline.  eICU Interventions  Tylenol liquid via tube PRN     Intervention Category Intermediate Interventions: Pain - evaluation and management  Lawanda Cousins 01/25/2016, 7:03 PM

## 2016-01-25 NOTE — Consult Note (Signed)
Date of Admission:  01/24/2016  Date of Consult:  01/25/2016  Reason for Consult: ? VAP and need for additional antibiotics Referring Physician: Dr. Nelda Marseille   HPI:  Andrew Reynolds is an 70 y.o. male who unfortunately feell in February and developed hematomas at C6-7-8 that resulted in near complete paralysis. He underwent multiple surgeries and was discharged to [facility]. He then developed a pneumonia and required tracheotomy. In May of this year he was evaluated at [facility] for possible ICD placement but at the time had enterococcal bacteremia with enterococcus present on blood culture. He was sent back to the [facility] with IV antibiotics although do not see that they performed a transesophageal echocardiogram.  Incidentally is a been on multiple rounds of different antibiotics likely for different real or perceived infections including VAPS and possible urinary tract infections. Prior to transfer to [facility] he had become profoundly hypothermic hypotensive and minimally responsive compared to baseline. In the ER and ABG was done which showed respiratory acidosis.  He had a PICC line placed at this LTAC prior to transfer. He was placed on vancomycin and zosyn and the  blood cultures obtained here at [facility] prior to [facility] starting antibiotics  Grew a Staph epidermidis species. His old line was removed and he had new line placed though no "catheter holiday" due to access issues. He was treated with meropenem while here which had more than adequate coverage for the pseudomonas and Serratia that grew form resp cultures here as well as an ESBL (of dubious consequence) in urine  He had TTE and was narrowed to vancomycin alone just prior to transfer back to [facility] but become unresponsive at [facility] with increasing O2 requirement and was transferred back to [facility]  He has improved since being at [facility] and on ventilator here with change of his tracheostomy tube. He has been afebrile on vancomycin alone.      Past Medical History:  Diagnosis Date  . A-fib ([place])   . Acute embolism and thrombosis of other specified deep vein of lower extremity, bilateral ([place])   . Anemia, unspecified   . Blind right eye   . Chronic pain   . Constipation   . 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 Dysphagia, oropharyngeal phase   . Essential (primary) hypertension   . GERD (gastroesophageal reflux disease)   . History of falling   . Hypokalemia   . Insomnia, unspecified   . Muscle weakness   . Osteoporosis   . Other voice and resonance disorders   . Pleural effusion   . Pneumonia   . Pressure ulcer of buttock, unstageable (HCC)    Right  . Quadriplegia, C1-C4 complete ([place])   . Respiratory failure, acute and chronic ([place])   . Rheumatoid arthritis ([place])   . Unspecified protein-calorie malnutrition ([place])   . Urinary tract infection, site not specified   . Urine retention     Past Surgical History:  Procedure Laterality Date  . TRACHEOSTOMY      Social History:  has no tobacco, alcohol, and drug history on file.   No family history on file.  Allergies  Allergen Reactions  . Sulfa Antibiotics Rash     Medications: I have reviewed patients current medications as documented in Epic Anti-infectives    Start     Dose/Rate Route Frequency Ordered Stop   01/24/16 1600  vancomycin (VANCOCIN) 500 mg in sodium chloride 0.9 % 100 mL IVPB  500 mg 100 mL/hr over 60 Minutes Intravenous Every 12 hours 01/24/16 1528           ROS: as in HPI otherwise remainder of 12 point Review of Systems is not obtainable due to patient's confusion   Blood pressure (!) 88/61, pulse 93, temperature 98.6 F (37 C), temperature source Oral, resp. rate (!) 25, weight 224 lb 12.8 oz (102 kg), SpO2 100 %. General: Alert and awake, oriented x3, not in any acute with improved color HEENT: anicteric sclera,  EOMI, oropharynx clear and without exudate Cardiovascular: tachycardc , normal r,  no  murmur rubs or gallops Pulmonary:  rhonchi Gastrointestinal: soft nontender, nondistended, normal bowel sounds, Musculoskeletal: no  clubbing or edema noted bilaterally Skin, soft tissue: no rashes Neuro: he is moving one of his hands towards his tracheostomy   Results for orders placed or performed during the hospital encounter of 01/24/16 (from the past 48 hour(s))  CBC with Differential     Status: Abnormal   Collection Time: 01/24/16 11:30 AM  Result Value Ref Range   WBC 11.0 (H) 4.0 - 10.5 K/uL   RBC 3.06 (L) 4.22 - 5.81 MIL/uL   Hemoglobin 8.2 (L) 13.0 - 17.0 g/dL   HCT 30.1 (L) 39.0 - 52.0 %   MCV 98.4 78.0 - 100.0 fL   MCH 26.8 26.0 - 34.0 pg   MCHC 27.2 (L) 30.0 - 36.0 g/dL   RDW 18.7 (H) 11.5 - 15.5 %   Platelets 314 150 - 400 K/uL   Neutrophils Relative % 88 %   Neutro Abs 9.7 (H) 1.7 - 7.7 K/uL   Lymphocytes Relative 8 %   Lymphs Abs 0.8 0.7 - 4.0 K/uL   Monocytes Relative 3 %   Monocytes Absolute 0.4 0.1 - 1.0 K/uL   Eosinophils Relative 1 %   Eosinophils Absolute 0.1 0.0 - 0.7 K/uL   Basophils Relative 0 %   Basophils Absolute 0.0 0.0 - 0.1 K/uL  Basic metabolic panel     Status: Abnormal   Collection Time: 01/24/16 11:30 AM  Result Value Ref Range   Sodium 146 (H) 135 - 145 mmol/L   Potassium 4.2 3.5 - 5.1 mmol/L   Chloride 105 101 - 111 mmol/L   CO2 40 (H) 22 - 32 mmol/L   Glucose, Bld 89 65 - 99 mg/dL   BUN 39 (H) 6 - 20 mg/dL   Creatinine, Ser <0.30 (L) 0.61 - 1.24 mg/dL   Calcium 8.5 (L) 8.9 - 10.3 mg/dL   GFR calc non Af Amer NOT CALCULATED >60 mL/min   GFR calc Af Amer NOT CALCULATED >60 mL/min    Comment: (NOTE) The eGFR has been calculated using the CKD EPI equation. This calculation has not been validated in all clinical situations. eGFR's persistently <60 mL/min signify possible Chronic Kidney Disease.    Anion gap 1 (L) 5 - 15    Comment: REPEATED TO VERIFY  Brain natriuretic peptide     Status: Abnormal   Collection Time: 01/24/16 11:30  AM  Result Value Ref Range   B Natriuretic Peptide 521.6 (H) 0.0 - 100.0 pg/mL  I-Stat Troponin, ED - 0, 3, 6 hours (not at [facility])     Status: None   Collection Time: 01/24/16 11:40 AM  Result Value Ref Range   Troponin i, poc 0.00 0.00 - 0.08 ng/mL   Comment 3            Comment: Due to the release kinetics of cTnI, a  negative result within the first hours of the onset of symptoms does not rule out myocardial infarction with certainty. If myocardial infarction is still suspected, repeat the test at appropriate intervals.   I-Stat Arterial Blood Gas, ED - (order at [facility] and [facility] only)     Status: Abnormal   Collection Time: 01/24/16 12:09 PM  Result Value Ref Range   pH, Arterial 7.433 7.350 - 7.450   pCO2 arterial 60.0 (HH) 35.0 - 45.0 mmHg   pO2, Arterial 51.0 (L) 80.0 - 100.0 mmHg   Bicarbonate 40.3 (H) 20.0 - 24.0 mEq/L   TCO2 42 0 - 100 mmol/L   O2 Saturation 86.0 %   Acid-Base Excess 14.0 (H) 0.0 - 2.0 mmol/L   Patient temperature 97.6 F    Collection site RADIAL, ALLEN'S TEST ACCEPTABLE    Drawn by Operator    Sample type ARTERIAL    Comment NOTIFIED PHYSICIAN   I-Stat Troponin, ED - 0, 3, 6 hours (not at [facility])     Status: None   Collection Time: 01/24/16  2:19 PM  Result Value Ref Range   Troponin i, poc 0.00 0.00 - 0.08 ng/mL   Comment 3            Comment: Due to the release kinetics of cTnI, a negative result within the first hours of the onset of symptoms does not rule out myocardial infarction with certainty. If myocardial infarction is still suspected, repeat the test at appropriate intervals.   Troponin I     Status: None   Collection Time: 01/24/16  8:49 PM  Result Value Ref Range   Troponin I <0.03 <0.03 ng/mL  I-Stat Troponin, ED - 0, 3, 6 hours (not at [facility])     Status: None   Collection Time: 01/24/16  9:06 PM  Result Value Ref Range   Troponin i, poc 0.00 0.00 - 0.08 ng/mL   Comment 3            Comment: Due to the release kinetics of cTnI, a negative  result within the first hours of the onset of symptoms does not rule out myocardial infarction with certainty. If myocardial infarction is still suspected, repeat the test at appropriate intervals.   Glucose, capillary     Status: None   Collection Time: 01/24/16  9:28 PM  Result Value Ref Range   Glucose-Capillary 74 65 - 99 mg/dL   Comment 1 Notify RN   Glucose, capillary     Status: None   Collection Time: 01/25/16 12:07 AM  Result Value Ref Range   Glucose-Capillary 88 65 - 99 mg/dL   Comment 1 Notify RN   Basic metabolic panel     Status: Abnormal   Collection Time: 01/25/16  5:00 AM  Result Value Ref Range   Sodium 148 (H) 135 - 145 mmol/L   Potassium 4.0 3.5 - 5.1 mmol/L   Chloride 104 101 - 111 mmol/L   CO2 38 (H) 22 - 32 mmol/L   Glucose, Bld 75 65 - 99 mg/dL   BUN 36 (H) 6 - 20 mg/dL   Creatinine, Ser 0.46 (L) 0.61 - 1.24 mg/dL   Calcium 8.5 (L) 8.9 - 10.3 mg/dL   GFR calc non Af Amer >60 >60 mL/min   GFR calc Af Amer >60 >60 mL/min    Comment: (NOTE) The eGFR has been calculated using the CKD EPI equation. This calculation has not been validated in all clinical situations. eGFR's persistently <60 mL/min signify possible Chronic  Kidney Disease.    Anion gap 6 5 - 15  CBC WITH DIFFERENTIAL     Status: Abnormal   Collection Time: 01/25/16  5:00 AM  Result Value Ref Range   WBC 11.3 (H) 4.0 - 10.5 K/uL   RBC 2.80 (L) 4.22 - 5.81 MIL/uL   Hemoglobin 7.6 (L) 13.0 - 17.0 g/dL   HCT 27.2 (L) 39.0 - 52.0 %   MCV 97.1 78.0 - 100.0 fL   MCH 27.1 26.0 - 34.0 pg   MCHC 27.9 (L) 30.0 - 36.0 g/dL   RDW 18.5 (H) 11.5 - 15.5 %   Platelets 302 150 - 400 K/uL   Neutrophils Relative % 88 %   Neutro Abs 10.0 (H) 1.7 - 7.7 K/uL   Lymphocytes Relative 9 %   Lymphs Abs 1.0 0.7 - 4.0 K/uL   Monocytes Relative 3 %   Monocytes Absolute 0.3 0.1 - 1.0 K/uL   Eosinophils Relative 0 %   Eosinophils Absolute 0.0 0.0 - 0.7 K/uL   Basophils Relative 0 %   Basophils Absolute 0.0 0.0  - 0.1 K/uL  Culture, respiratory (NON-Expectorated)     Status: None (Preliminary result)   Collection Time: 01/25/16 11:11 AM  Result Value Ref Range   Specimen Description TRACHEAL ASPIRATE    Special Requests NONE    Gram Stain      MODERATE WBC PRESENT, PREDOMINANTLY PMN MODERATE GRAM NEGATIVE RODS    Culture PENDING    Report Status PENDING   I-STAT 3, arterial blood gas (G3+)     Status: Abnormal   Collection Time: 01/25/16 12:50 PM  Result Value Ref Range   pH, Arterial 7.425 7.350 - 7.450   pCO2 arterial 59.2 (HH) 35.0 - 45.0 mmHg   pO2, Arterial 76.0 (L) 80.0 - 100.0 mmHg   Bicarbonate 38.8 (H) 20.0 - 24.0 mEq/L   TCO2 41 0 - 100 mmol/L   O2 Saturation 95.0 %   Acid-Base Excess 13.0 (H) 0.0 - 2.0 mmol/L   Patient temperature 98.8 F    Collection site RADIAL, ALLEN'S TEST ACCEPTABLE    Drawn by Operator    Sample type ARTERIAL    Comment NOTIFIED PHYSICIAN    '@BRIEFLABTABLE' (sdes,specrequest,cult,reptstatus)   ) Recent Results (from the past 720 hour(s))  Blood Culture (routine x 2)     Status: Abnormal   Collection Time: 01/16/16  6:20 PM  Result Value Ref Range Status   Specimen Description BLOOD LEFT HAND  Final   Special Requests IN PEDIATRIC BOTTLE 2 CC  Final   Culture  Setup Time   Final    GRAM POSITIVE COCCI IN CLUSTERS CRITICAL RESULT CALLED TO, READ BACK BY AND VERIFIED WITH: TO GABOTT(PHARD) BY TCLEVELAND 01/18/2016 AT 4:37AM AEROBIC BOTTLE ONLY    Culture STAPHYLOCOCCUS SPECIES (COAGULASE NEGATIVE) (A)  Final   Report Status 01/23/2016 FINAL  Final   Organism ID, Bacteria STAPHYLOCOCCUS SPECIES (COAGULASE NEGATIVE)  Final      Susceptibility   Staphylococcus species (coagulase negative) - MIC*    CIPROFLOXACIN >=8 RESISTANT Resistant     ERYTHROMYCIN >=8 RESISTANT Resistant     GENTAMICIN <=0.5 SENSITIVE Sensitive     OXACILLIN >=4 RESISTANT Resistant     TETRACYCLINE <=1 SENSITIVE Sensitive     VANCOMYCIN 1 SENSITIVE Sensitive     TRIMETH/SULFA  <=10 SENSITIVE Sensitive     CLINDAMYCIN <=0.25 RESISTANT Resistant     RIFAMPIN <=0.5 SENSITIVE Sensitive     Inducible Clindamycin POSITIVE Resistant     *  STAPHYLOCOCCUS SPECIES (COAGULASE NEGATIVE)  Blood Culture ID Panel (Reflexed)     Status: None   Collection Time: 01/16/16  6:20 PM  Result Value Ref Range Status   Enterococcus species NOT DETECTED NOT DETECTED Final   Vancomycin resistance NOT DETECTED NOT DETECTED Final   Listeria monocytogenes NOT DETECTED NOT DETECTED Final   Staphylococcus species NOT DETECTED NOT DETECTED Final   Staphylococcus aureus NOT DETECTED NOT DETECTED Final   Methicillin resistance NOT DETECTED NOT DETECTED Final   Streptococcus species NOT DETECTED NOT DETECTED Final   Streptococcus agalactiae NOT DETECTED NOT DETECTED Final   Streptococcus pneumoniae NOT DETECTED NOT DETECTED Final   Streptococcus pyogenes NOT DETECTED NOT DETECTED Final   Acinetobacter baumannii NOT DETECTED NOT DETECTED Final   Enterobacteriaceae species NOT DETECTED NOT DETECTED Final   Enterobacter cloacae complex NOT DETECTED NOT DETECTED Final   Escherichia coli NOT DETECTED NOT DETECTED Final   Klebsiella oxytoca NOT DETECTED NOT DETECTED Final   Klebsiella pneumoniae NOT DETECTED NOT DETECTED Final   Proteus species NOT DETECTED NOT DETECTED Final   Serratia marcescens NOT DETECTED NOT DETECTED Final   Carbapenem resistance NOT DETECTED NOT DETECTED Final   Haemophilus influenzae NOT DETECTED NOT DETECTED Final   Neisseria meningitidis NOT DETECTED NOT DETECTED Final   Pseudomonas aeruginosa NOT DETECTED NOT DETECTED Final   Candida albicans NOT DETECTED NOT DETECTED Final   Candida glabrata NOT DETECTED NOT DETECTED Final   Candida krusei NOT DETECTED NOT DETECTED Final   Candida parapsilosis NOT DETECTED NOT DETECTED Final   Candida tropicalis NOT DETECTED NOT DETECTED Final  Blood Culture (routine x 2)     Status: Abnormal   Collection Time: 01/16/16  6:25 PM    Result Value Ref Range Status   Specimen Description BLOOD LEFT WRIST  Final   Special Requests IN PEDIATRIC BOTTLE 3 CC  Final   Culture  Setup Time   Final    GRAM POSITIVE COCCI IN CLUSTERS AEROBIC BOTTLE ONLY CRITICAL RESULT CALLED TO, READ BACK BY AND VERIFIED WITH: C ABBOTT,PHARMD AT 0437 01/18/16 BY T [place]    Culture (A)  Final    STAPHYLOCOCCUS SPECIES (COAGULASE NEGATIVE) SUSCEPTIBILITIES PERFORMED ON PREVIOUS CULTURE WITHIN THE LAST 5 DAYS. DIPHTHEROIDS(CORYNEBACTERIUM SPECIES) Standardized susceptibility testing for this organism is not available.    Report Status 01/23/2016 FINAL  Final  Urine culture     Status: Abnormal   Collection Time: 01/16/16  8:25 PM  Result Value Ref Range Status   Specimen Description URINE, RANDOM  Final   Special Requests NONE  Final   Culture (A)  Final    60,000 COLONIES/mL KLEBSIELLA PNEUMONIAE Confirmed Extended Spectrum Beta-Lactamase Producer (ESBL)    Report Status 01/19/2016 FINAL  Final   Organism ID, Bacteria KLEBSIELLA PNEUMONIAE (A)  Final      Susceptibility   Klebsiella pneumoniae - MIC*    AMPICILLIN >=32 RESISTANT Resistant     CEFAZOLIN >=64 RESISTANT Resistant     CEFTRIAXONE 8 RESISTANT Resistant     CIPROFLOXACIN 2 INTERMEDIATE Intermediate     GENTAMICIN <=1 SENSITIVE Sensitive     IMIPENEM <=0.25 SENSITIVE Sensitive     NITROFURANTOIN 128 RESISTANT Resistant     TRIMETH/SULFA <=20 SENSITIVE Sensitive     AMPICILLIN/SULBACTAM >=32 RESISTANT Resistant     PIP/TAZO 32 INTERMEDIATE Intermediate     Extended ESBL POSITIVE Resistant     * 60,000 COLONIES/mL KLEBSIELLA PNEUMONIAE  MRSA PCR Screening     Status: None  Collection Time: 01/16/16 11:29 PM  Result Value Ref Range Status   MRSA by PCR NEGATIVE NEGATIVE Final    Comment:        The GeneXpert MRSA Assay (FDA approved for NASAL specimens only), is one component of a comprehensive MRSA colonization surveillance program. It is not intended to  diagnose MRSA infection nor to guide or monitor treatment for MRSA infections.   Culture, respiratory (NON-Expectorated)     Status: None   Collection Time: 01/17/16  1:32 AM  Result Value Ref Range Status   Specimen Description TRACHEAL ASPIRATE  Final   Special Requests Normal  Final   Gram Stain   Final    ABUNDANT WBC PRESENT, PREDOMINANTLY PMN NO SQUAMOUS EPITHELIAL CELLS SEEN FEW GRAM POSITIVE RODS FEW GRAM POSITIVE COCCI IN PAIRS    Culture   Final    ABUNDANT SERRATIA MARCESCENS ABUNDANT PSEUDOMONAS AERUGINOSA    Report Status 01/20/2016 FINAL  Final   Organism ID, Bacteria SERRATIA MARCESCENS  Final   Organism ID, Bacteria PSEUDOMONAS AERUGINOSA  Final      Susceptibility   Pseudomonas aeruginosa - MIC*    CEFTAZIDIME 4 SENSITIVE Sensitive     CIPROFLOXACIN <=0.25 SENSITIVE Sensitive     GENTAMICIN <=1 SENSITIVE Sensitive     IMIPENEM 2 SENSITIVE Sensitive     PIP/TAZO 8 SENSITIVE Sensitive     CEFEPIME 4 SENSITIVE Sensitive     * ABUNDANT PSEUDOMONAS AERUGINOSA   Serratia marcescens - MIC*    CEFAZOLIN >=64 RESISTANT Resistant     CEFEPIME <=1 SENSITIVE Sensitive     CEFTAZIDIME <=1 SENSITIVE Sensitive     CEFTRIAXONE <=1 SENSITIVE Sensitive     CIPROFLOXACIN <=0.25 SENSITIVE Sensitive     GENTAMICIN <=1 SENSITIVE Sensitive     TRIMETH/SULFA <=20 SENSITIVE Sensitive     * ABUNDANT SERRATIA MARCESCENS  Culture, blood (Routine X 2) w Reflex to ID Panel     Status: None   Collection Time: 01/19/16  3:04 PM  Result Value Ref Range Status   Specimen Description BLOOD LEFT HAND  Final   Special Requests IN PEDIATRIC BOTTLE 0.5CC  Final   Culture NO GROWTH 5 DAYS  Final   Report Status 01/24/2016 FINAL  Final  Culture, blood (Routine X 2) w Reflex to ID Panel     Status: None   Collection Time: 01/19/16  3:15 PM  Result Value Ref Range Status   Specimen Description BLOOD LEFT FINGER  Final   Special Requests IN PEDIATRIC BOTTLE 3CC  Final   Culture NO GROWTH 5  DAYS  Final   Report Status 01/24/2016 FINAL  Final  Culture, bal-quantitative     Status: None   Collection Time: 01/20/16 12:02 PM  Result Value Ref Range Status   Specimen Description BRONCHIAL ALVEOLAR LAVAGE  Final   Special Requests NONE  Final   Gram Stain   Final    ABUNDANT WBC PRESENT, PREDOMINANTLY PMN NO ORGANISMS SEEN    Culture MULTIPLE ORGANISMS PRESENT, NONE PREDOMINANT  Final   Report Status 01/23/2016 FINAL  Final  Culture, respiratory (NON-Expectorated)     Status: None (Preliminary result)   Collection Time: 01/25/16 11:11 AM  Result Value Ref Range Status   Specimen Description TRACHEAL ASPIRATE  Final   Special Requests NONE  Final   Gram Stain   Final    MODERATE WBC PRESENT, PREDOMINANTLY PMN MODERATE GRAM NEGATIVE RODS    Culture PENDING  Incomplete   Report Status PENDING  Incomplete  Impression/Recommendation  Active Problems:   Respiratory failure (HCC)   SSS (sick sinus syndrome) (HCC)   Acute on chronic respiratory failure with hypoxemia (HCC)   Coagulase negative Staphylococcus bacteremia   ESBL E. 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 male with  mx medical problems and catastrophic bleed into spine with tetraplegia, bed bound, with decubitus ulcers, with VDRF and now that it was septic shock and hypothermia due to coagulase-negative staphylococcal bacteremia readmitted after hypoxemic event at [facility] with AMS improved after transfer to [facility] and change in size of his tracheostomy tube   #1 Hypoxemic event:  I asked for fresh respiratory cultures  but absent significant fever, change in CXR I do NOT suspect new VAP  I would continue to without antibiotics for VAP  During last admission he produced copious secretions and they would track around his tracheostomy tube  I think thi sis a mechanical event with plugging having caused this hypoxemic event  #2 Coag negative  staphyloccocal bacteremia:  --complete original course with stop date on 02/01/16    01/25/2016, 6:27 PM   Thank you so much for this interesting consult  [place] [facility] [place] [phone] (pager) [phone] (office) 01/25/2016, 6:27 PM  [place] 01/25/2016, 6:27 PM

## 2016-01-25 NOTE — Progress Notes (Signed)
Pharmacy Antibiotic Note  Andrew Reynolds is a 70 y.o. male admitted on 01/24/2016 from [facility] with respiratory failure.  He was started on vancomycin on 8/5 during a recent ICU admission and has been on the medication since for bacteremia w/ coagulation negative staph species. Pharmacy has been consulted for vancomycin dosing.   Plan: Continue vancomycin 500mg  IV q12h Goal trough: 15-20 Monitor renal function, C/S, clinical progress VT as appropriate  Weight: 224 lb 12.8 oz (102 kg)  Temp (24hrs), Avg:98.8 F (37.1 C), Min:98.6 F (37 C), Max:99.3 F (37.4 C)   Recent Labs Lab 01/20/16 0450 01/20/16 0458 01/21/16 0530 01/21/16 1733 01/22/16 0506 01/22/16 1230 01/24/16 1130 01/25/16 0500  WBC  --  20.8* 14.3*  --  13.7*  --  11.0* 11.3*  CREATININE  --  0.55* 0.48* 0.42*  --   --  <0.30* 0.46*  VANCOTROUGH 19  --   --   --   --  22*  --   --     Estimated Creatinine Clearance: 106.2 mL/min (by C-G formula based on SCr of 0.8 mg/dL).    Allergies  Allergen Reactions  . Sulfa Antibiotics Rash    Antimicrobials this admission: Vancomycin 8/5 (continued from previous admission/[facility]) >>   Dose adjustments this admission: N/A  Microbiology results: 8/14 BCx: sent  8/14 Trach asp: sent   Thank you for allowing pharmacy to be a part of this patient's care.  9/14 [facility] 01/25/2016 1:35 PM

## 2016-01-26 DIAGNOSIS — Z2239 Carrier of other specified bacterial diseases: Secondary | ICD-10-CM

## 2016-01-26 DIAGNOSIS — G8250 Quadriplegia, unspecified: Secondary | ICD-10-CM

## 2016-01-26 LAB — BASIC METABOLIC PANEL
ANION GAP: 7 (ref 5–15)
BUN: 33 mg/dL — ABNORMAL HIGH (ref 6–20)
CALCIUM: 8.3 mg/dL — AB (ref 8.9–10.3)
CO2: 36 mmol/L — ABNORMAL HIGH (ref 22–32)
Chloride: 107 mmol/L (ref 101–111)
Creatinine, Ser: 0.44 mg/dL — ABNORMAL LOW (ref 0.61–1.24)
GFR calc Af Amer: >60 mL/min (ref >60)
GLUCOSE: 75 mg/dL (ref 65–99)
Potassium: 3.5 mmol/L (ref 3.5–5.1)
Sodium: 150 mmol/L — ABNORMAL HIGH (ref 135–145)

## 2016-01-26 LAB — CBC
HCT: 26.6 % — ABNORMAL LOW (ref 39.0–52.0)
Hemoglobin: 7.3 g/dL — ABNORMAL LOW (ref 13.0–17.0)
MCH: 26.4 pg (ref 26.0–34.0)
MCHC: 27.4 g/dL — ABNORMAL LOW (ref 30.0–36.0)
MCV: 96 fL (ref 78.0–100.0)
PLATELETS: 287 10*3/uL (ref 150–400)
RBC: 2.77 MIL/uL — ABNORMAL LOW (ref 4.22–5.81)
RDW: 18.9 % — AB (ref 11.5–15.5)
WBC: 8.9 10*3/uL (ref 4.0–10.5)

## 2016-01-26 LAB — MAGNESIUM: Magnesium: 2.6 mg/dL — ABNORMAL HIGH (ref 1.7–2.4)

## 2016-01-26 LAB — PHOSPHORUS: Phosphorus: 3.9 mg/dL (ref 2.5–4.6)

## 2016-01-26 LAB — VANCOMYCIN, TROUGH: VANCOMYCIN TR: 18 ug/mL (ref 15–20)

## 2016-01-26 MED ORDER — FREE WATER
250.0000 mL | Freq: Four times a day (QID) | Status: DC
  Administered 2016-01-26: 250 mL

## 2016-01-26 MED ORDER — ENOXAPARIN SODIUM 40 MG/0.4ML ~~LOC~~ SOLN: 40.0000 mg | Freq: Every day | SUBCUTANEOUS | Status: AC

## 2016-01-26 MED ORDER — VITAL HIGH PROTEIN PO LIQD
1000.0000 mL | ORAL | Status: DC
  Administered 2016-01-26: 1000 mL

## 2016-01-26 NOTE — Progress Notes (Signed)
Pharmacy Antibiotic Note  Andrew Reynolds is a 70 y.o. male admitted on 01/24/2016 from [facility] with respiratory failure.  He was started on vancomycin on 8/5 during a recent ICU admission and has been on the medication since for bacteremia w/ coagulation negative staph species. Pharmacy has been consulted for vancomycin dosing.   0300 VT today was therapeutic at 18 ug/mL. Nurse confirmed that level was drawn appropriately before vancomycin dose was given. Will continue current rate and continue to monitor renal function closely.   Plan: Continue vancomycin 500mg  IV q12h Goal trough: 15-20 Monitor renal function, C/S, clinical progress VT as appropriate  Weight: 231 lb 0.7 oz (104.8 kg)  Temp (24hrs), Avg:98.4 F (36.9 C), Min:97.5 F (36.4 C), Max:98.9 F (37.2 C)   Recent Labs Lab 01/21/16 0530 01/21/16 1733 01/22/16 0506 01/22/16 1230 01/24/16 1130 01/25/16 0500 01/26/16 0406  WBC 14.3*  --  13.7*  --  11.0* 11.3* 8.9  CREATININE 0.48* 0.42*  --   --  <0.30* 0.46* 0.44*  VANCOTROUGH  --   --   --  22*  --   --  18    Estimated Creatinine Clearance: 107.6 mL/min (by C-G formula based on SCr of 0.8 mg/dL).    Allergies  Allergen Reactions  . Sulfa Antibiotics Rash    Antimicrobials this admission: Vancomycin 8/5 (continued from previous admission/[facility]) >>   Dose adjustments this admission: N/A  Microbiology results: 8/14 BCx: sent  8/14 Trach asp: sent   Thank you for allowing pharmacy to be a part of this patient's care.  9/14 [facility] 01/26/2016 9:04 AM

## 2016-01-26 NOTE — Progress Notes (Signed)
Patient will DC to: [facility] Anticipated DC date: 01/26/2016 Family notified: Kahleel Fadeley, Patient's son [phone])   Transport by: Melburn Hake  Nurse to Nurse Report: [phone] Please complete EMS transfer report.  Please have physician complete physician transfer form ([place])       Lance Muss, LCSW [facility] ED/65M Clinical Social Worker [phone]

## 2016-01-26 NOTE — Care Management Note (Signed)
Case Management Note  Patient Details  Name: Andrew Reynolds MRN: [id number] Date of Birth: 10/26/1945  Subjective/Objective:      Pt admitted with loss of consciousness and bradycardia                Action/Plan:  Pt is from [facility] - recently discharged to [facility] on 01/22/16.   Expected Discharge Date:                  Expected Discharge Plan:  Skilled Nursing Facility  In-House Referral:  Clinical Social Work  Discharge planning Services  CM Consult  Post Acute Care Choice:    Choice offered to:     DME Arranged:    DME Agency:     HH Arranged:    HH Agency:     Status of Service:  Completed, signed off  If discussed at [employer], dates discussed:    Additional Comments: Pt will discharge back to [facility] 01/26/16 Cherylann Parr, RN 01/26/2016, 10:32 AM

## 2016-01-26 NOTE — Progress Notes (Signed)
Initial Nutrition Assessment  DOCUMENTATION CODES:      INTERVENTION:     NUTRITION DIAGNOSIS:     related to   as evidenced by  .    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 Importantly, the patient was recently discharged on 01/22/2016 from [facility] for septic shock secondary to coag-negative staph bacteremia currently treated on vancomycin. Following discharge on 01/22/2016 the patient was at [facility] for long-term acute care but this morning the patient became altered and had increased oxygen requirements  Patient is currently intubated on ventilator support MV: 12.5 L/min Temp (24hrs), Avg:98.4 F (36.9 C), Min:97.5 F (36.4 C), Max:98.9 F (37.2 C)  Propofol: none   Diet Order:  Diet NPO time specified  Skin:     Last BM:  8/15  Height:   Ht Readings from Last 1 Encounters:  01/16/16 6' (1.829 m)    Weight:   Wt Readings from Last 1 Encounters:  01/26/16 231 lb 0.7 oz (104.8 kg)    Ideal Body Weight:  72.81 kg  BMI:  Body mass index is 31.33 kg/m.  Estimated Nutritional Needs:   Kcal:  4540-9811  Protein:  146gm  Fluid:  1.2-1.5L  EDUCATION NEEDS:

## 2016-01-26 NOTE — Progress Notes (Signed)
PT Cancellation Note  Patient Details Name: Andrew Reynolds MRN: [id number] DOB: 1946/06/09   Cancelled Treatment:    Reason Eval/Treat Not Completed: Other (comment). Pt screened. Pt has been quadriplegic since Feb. Dependent and to return to [facility]. Will defer any further needs to [facility].   Alyza Artiaga 01/26/2016, 9:09 AM Skip Mayer PT [phone]

## 2016-01-26 NOTE — Progress Notes (Signed)
PULMONARY / CRITICAL CARE MEDICINE   Name: Andrew Reynolds MRN: [id number] DOB: [birth year]    ADMISSION DATE:  01/24/2016 CONSULTATION DATE:  8/13  REFERRING MD:  EDP  CHIEF COMPLAINT: Bradycardia and decreased loc  HISTORY OF PRESENT ILLNESS:   Andrew Reynolds is an unfortunate 70 yo male who fell and suffered cervical cord damage ,complicated by anticoagulation resulting in 3 cervical surgeries. The surgeries left him with severe quadriparesis with limited use of his extremities. He is now trach and Peg dependent. Just discharged from [facility] back to  [facility] following treatment for sepsis from Serratia / Pseudomonas pna and esbl uti. He returns to ED with bradycardia and decreased LOC. He is awake and conversant. HD stable. Lungs are coarse with good air movement. He is being treated for recent infections. Troponin's are negative. Difficult to say what triggered today's episode. Suspect he can return to [facility] at this time.  SUBJECTIVE:  No issues overnight.  VITAL SIGNS: BP (!) 98/56   Pulse (!) 28   Temp 98.9 F (37.2 C) (Oral)   Resp (!) 28   Wt 104.8 kg (231 lb 0.7 oz)   SpO2 93%   BMI 31.33 kg/m   HEMODYNAMICS:     VENTILATOR SETTINGS: Vent Mode: PRVC FiO2 (%):  [50 %-100 %] 50 % Set Rate:  [28 bmp] 28 bmp Vt Set:  [470 mL-500 mL] 470 mL PEEP:  [10 cmH20-12 cmH20] 12 cmH20 Plateau Pressure:  [32 cmH20-35 cmH20] 35 cmH20  INTAKE / OUTPUT: I/O last 3 completed shifts: In: 2555 [I.V.:2255; IV Piggyback:300] Out: 2505 [Urine:2505]  PHYSICAL EXAMINATION: General: Tall wm in no distress Neuro:  Follows commands, moves arms and toes to command weakly HEENT: #6 trach in place Cardiovascular:  HSD RRR Lungs:  Coarse rhonchi with decreased bs bases r>l Abdomen:  PEG, + bs Musculoskeletal:  Intact Skin:  Warm/dry. lle with increased edema  LABS:  BMET  Recent Labs Lab 01/24/16 1130 01/25/16 0500 01/26/16 0406  NA 146* 148* 150*  K 4.2 4.0 3.5   CL 105 104 107  CO2 40* 38* 36*  BUN 39* 36* 33*  CREATININE <0.30* 0.46* 0.44*  GLUCOSE 89 75 75   Electrolytes  Recent Labs Lab 01/19/16 1807  01/24/16 1130 01/25/16 0500 01/26/16 0406  CALCIUM  --   < > 8.5* 8.5* 8.3*  MG 2.5*  --   --   --  2.6*  PHOS 4.4  --   --   --  3.9  < > = values in this interval not displayed.  CBC  Recent Labs Lab 01/24/16 1130 01/25/16 0500 01/26/16 0406  WBC 11.0* 11.3* 8.9  HGB 8.2* 7.6* 7.3*  HCT 30.1* 27.2* 26.6*  PLT 314 302 287   Coag's No results for input(s): APTT, INR in the last 168 hours.  Sepsis Markers No results for input(s): LATICACIDVEN, PROCALCITON, O2SATVEN in the last 168 hours.  ABG  Recent Labs Lab 01/24/16 1209 01/25/16 1250  PHART 7.433 7.425  PCO2ART 60.0* 59.2*  PO2ART 51.0* 76.0*   Liver Enzymes No results for input(s): AST, ALT, ALKPHOS, BILITOT, ALBUMIN in the last 168 hours.  Cardiac Enzymes  Recent Labs Lab 01/24/16 2049  TROPONINI <0.03   Glucose  Recent Labs Lab 01/22/16 0014 01/22/16 0407 01/22/16 0727 01/22/16 1150 01/24/16 2128 01/25/16 0007  GLUCAP 125* 118* 138* 116* 74 88   Imaging No results found. STUDIES:     CULTURES:   ANTIBIOTICS: Vanc per [facility]  SIGNIFICANT EVENTS: 8/13  bradycardia  LINES/TUBES: 8/6 Rt IJ CVL>> Trach>> Foley>>  DISCUSSION: [name] is an unfortunate 70 yo male who fell and suffered cervical cord damage ,complicated by anticoagulation resulting in 3 cervical surgeries. The surgeries left him with severe quadriparesis with limited use of his extremities. He is now trach and Peg dependent. Just discharged from [facility] back to [facility] following treatment for sepsis from Serratia / Pseudomonas pna and esbl uti. He returns to ED with bradycardia and decreased LOC.  He is awake and conversant. HD stable. Lungs are coarse with good air movement. He is being treated for recent infections. Troponin's are negative. Difficult to say what  triggered today's episode. Suspect he can return to [facility] at this time.  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 Hypoxia  P:   Titrate O2 for sat of 88-92%. Continue vent support, no weaning. May need diureses once BP is more stable. D/C IVF.  CARDIOVASCULAR A:  CAF DVT Hemodynamically stable  P:  Hold all anti-HTN. KVO IVF. Hold diuretics.  RENAL Lab Results  Component Value Date   CREATININE 0.44 (L) 01/26/2016   CREATININE 0.46 (L) 01/25/2016   CREATININE <0.30 (L) 01/24/2016   Recent Labs Lab 01/24/16 1130 01/25/16 0500 01/26/16 0406  K 4.2 4.0 3.5   A:   Hypernatremia mild P:   Replace electrolytes as indicated. KVO IVF. TF per nutrition. Free water 250 ml q6 hours.  GASTROINTESTINAL A:   Peg in place P:   Start ensure at 30 ml/hr.  HEMATOLOGIC  Recent Labs  01/25/16 0500 01/26/16 0406  HGB 7.6* 7.3*   A:   Recent LUE DVT from PICC line IVC filter from previous DVT P:  Follow H&H. Transfuse per ICU protocol.  INFECTIOUS A:   Recent treatment Coag neg staph bacteremia currently on vanc. ESBL Klebsiella in urine 8/6 resp cul + Serratia Marcescens and Pseudomonas A. Trweated prior top DC [facility] 8/11 P:   Continue abx. ID consult appreciated, vancomycin til 8/21.  ENDOCRINE CBG (last 3)   Recent Labs  01/24/16 2128 01/25/16 0007  GLUCAP 74 88   A:   No acute issue P:   Follow glucose  NEUROLOGIC A:   Post multiple cervical surgeries from fall while on anticoagulation resulting in quadriparesis.  He can move arms and wiggle toes but can only assist minimally in ADL's. Cognitively intact. P:   RASS goal: 0. Pain medications as needed.  FAMILY  - Updates: No family bedside.  Transfer to [facility] today with abx as above.  - Inter-disciplinary family meet or Palliative Care  meeting due by:  8/20  The patient is critically ill with multiple organ systems failure and requires high complexity decision making for assessment and support, frequent evaluation and titration of therapies, application of advanced monitoring technologies and extensive interpretation of multiple databases.   Critical Care Time devoted to patient care services described in this note is  35  Minutes. This time reflects time of care of this signee Dr Koren Bound. This critical care time does not reflect procedure time, or teaching time or supervisory time of PA/NP/Med student/Med Resident etc but could involve care discussion time.  Alyson Reedy, M.D. [facility] Pulmonary/Critical Care Medicine. Pager: [phone]. After hours pager: [phone].  01/26/2016, 9:33 AM

## 2016-01-26 NOTE — Discharge Summary (Signed)
Physician Discharge Summary       Patient ID: Andrew Reynolds MRN: [id number] DOB/AGE: [birth year] 71 y.o.  Admit date: 01/24/2016 Discharge date: 01/26/2016  Discharge Diagnoses:  Active Problems:   Respiratory failure (HCC)   SSS (sick sinus syndrome) (HCC)   Acute on chronic respiratory failure with hypoxemia (HCC)   Coagulase negative Staphylococcus bacteremia   ESBL E. coli carrier   Quadriplegia and quadriparesis (HCC)   Hypoxia   History of Present Illness:  Andrew Reynolds is an unfortunate 70 yo male who fell and suffered cervical cord damage ,complicated by anticoagulation resulting in 3 cervical surgeries. The surgeries left him with severe quadriparesis with limited use of his extremities. He is now trach and Peg dependent. Just discharged from [facility] back to  [facility] following treatment for sepsis from Serratia / Pseudomonas pna and esbl uti. He returns to ED with bradycardia and decreased LOC. Upon arrival to the emergency department he was awake and conversant. HD stable. Lungs were coarse, but with good air movement. He is being treated for recent infections. Difficult to say what triggered the initial episode. He did have some cuff leak around the trach, therefore he was changed to XLT. Hospital coarse was uncomplicated and he was deemed a candidate for discharge to [facility] 8/15.    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 > resolved with trach change -Titrate O2 for sat of 88-92%. -Continue vent support, no weaning. -May need diureses once BP is more stable. -D/C IVF.  Hypernatremia mild   -Free water 250 ml q6 hours.  PEG status -Start ensure at 30 ml/hr.   Recent LUE DVT from PICC line IVC filter from previous DVT -Continue treatment dose lovenox -Follow H&H. -Transfuse per ICU  protocol.  Recent treatment Coag neg staph bacteremia ESBL Klebsiella in urine 8/6 resp cul + Serratia Marcescens and Pseudomonas A. Treated [facility] to [facility] 8/11 -Vancomycin until 8/21.ID consult appreciated,  Quadriparesis secondary to fall and spinal surgeries. -Pain control as indicated -PT   Significant Hospital tests/ studies  Consults  Infectious disease Wound/ostomy nurse  Discharge Exam: BP (!) 98/56   Pulse (!) 28   Temp 98.9 F (37.2 C) (Oral)   Resp (!) 28   Wt 104.8 kg (231 lb 0.7 oz)   SpO2 93%   BMI 31.33 kg/m   General: Tall wm in no distress Neuro:  Follows commands, moves arms and toes to command weakly HEENT: #6 XLT trach in place Cardiovascular:  HSD RRR Lungs:  Coarse rhonchi with decreased bs bases r>l Abdomen:  PEG, + bs Musculoskeletal:  Intact Skin:  Warm/dry. lle with increased edema  Labs at discharge Lab Results  Component Value Date   CREATININE 0.44 (L) 01/26/2016   BUN 33 (H) 01/26/2016   NA 150 (H) 01/26/2016   K 3.5 01/26/2016   CL 107 01/26/2016   CO2 36 (H) 01/26/2016   Lab Results  Component Value Date   WBC 8.9 01/26/2016   HGB 7.3 (L) 01/26/2016   HCT 26.6 (L) 01/26/2016   MCV 96.0 01/26/2016   PLT 287 01/26/2016   Lab Results  Component Value Date   ALT 23 01/16/2016   AST 31 01/16/2016   ALKPHOS 69 01/16/2016   BILITOT 0.5 01/16/2016   No results found for: INR, PROTIME  Current radiology studies Portable Chest 1 View  Result  Date: 01/24/2016 CLINICAL DATA:  Shortness of breath. EXAM: PORTABLE CHEST 1 VIEW COMPARISON:  01/24/2016 FINDINGS: Tracheostomy tube noted. Anterior cervical plate and screw fixator along with posterolateral rod and facet screws in the lower cervical spine. Right IJ line tip:  SVC. Similar to prior, there are large layering bilateral pleural effusions with associated passive atelectasis. Indistinctness of the pulmonary vasculature is present and a component of edema is not excluded.  Cardiac margins are somewhat obscured but I am doubtful of significant cardiomegaly. IMPRESSION: 1. Essentially stable appearance of the chest, with layering moderate to large bilateral pleural effusions and passive atelectasis, and questionable edema but no overt cardiomegaly. Tracheostomy tube projects over the tracheal air shadow and a right IJ line projects over the SVC. Electronically Signed   By: Gaylyn Rong M.D.   On: 01/24/2016 18:25   Dg Chest Portable 1 View  Result Date: 01/24/2016 CLINICAL DATA:  Hypoxia. Received pt from [facility] with c/o 15-20 mins of unresponsiveness and bradycardia. Pt seen here last Sunday for decrease level of consciousness and had a CO2 of 155. At that time pt was diagnosed with HCAP. Pt unable to give hx at this time. EXAM: PORTABLE CHEST 1 VIEW COMPARISON:  01/13/2016 FINDINGS: Moderate bilateral pleural effusions obscure the hemidiaphragms and most of the heart borders. There is central hazy opacity that is likely combination of pulmonary edema and layering pleural fluid on this semi supine exam. Cardiac silhouette is grossly normal in size. No mediastinal or hilar masses. Tracheostomy tube and right internal jugular central venous line are stable in well positioned. No gross pneumothorax. IMPRESSION: 1. Moderate effusions. Hazy central lower lung zone opacity suggesting a combination of pulmonary edema and opacity from posterior layering pleural fluid. Pneumonia is possible. 2. Findings are similar to prior study allowing for differences in patient positioning. 3. Support apparatus is stable and well positioned. Electronically Signed   By: Amie Portland M.D.   On: 01/24/2016 11:29    Disposition:  63-Long Term Care  Discharge Instructions    Diet - low sodium heart healthy    Complete by:  As directed   Increase activity slowly    Complete by:  As directed       Medication List    TAKE these medications   acetaminophen 325 MG tablet Commonly  known as:  TYLENOL Place 325 mg into feeding tube every 8 (eight) hours as needed (for pain).   acetaZOLAMIDE 250 MG tablet Commonly known as:  DIAMOX Place 250 mg into feeding tube every 12 (twelve) hours. FOR FLUID RETENTION   chlorhexidine 0.12 % solution Commonly known as:  PERIDEX Use as directed 15 mLs in the mouth or throat 2 (two) times daily. FOR TRACH   collagenase ointment Commonly known as:  SANTYL Apply topically daily. Apply Santyl to right buttock wounds Q day, then cover with moist fluffed 2X2 and foam dressing.  (Change foam dressings Q 5 days or PRN soiling.)   DUONEB 0.5-2.5 (3) MG/3ML Soln Generic drug:  ipratropium-albuterol Take 3 mLs by nebulization every 6 (six) hours. RELATED TO ACUTE AND CHRONIC RESPIRATORY FAILURE, UNSPECIFIED WHETHER WITH HYPOXIA OR HYPERCAPNIA   enoxaparin 40 MG/0.4ML injection Commonly known as:  LOVENOX Inject 0.4 mLs (40 mg total) into the skin at bedtime.   famotidine 40 MG/5ML suspension Commonly known as:  PEPCID Place 2.5 mLs (20 mg total) into feeding tube 2 (two) times daily.   feeding supplement (PRO-STAT SUGAR FREE 64) Liqd Place 30 mLs into feeding  tube daily.   feeding supplement (VITAL AF 1.2 CAL) Liqd Place 1,000 mLs into feeding tube continuous.   ferrous sulfate 300 (60 Fe) MG/5ML syrup Place 300 mg into feeding tube 2 (two) times daily.   free water Soln Place 250 mLs into feeding tube every 4 (four) hours.   gabapentin 100 MG capsule Commonly known as:  NEURONTIN Place 3 capsules (300 mg total) into feeding tube 3 (three) times daily. FOR PAIN   LACTOBACILLUS RHAMNOSUS (GG) PO Take 1 capsule by mouth every 12 (twelve) hours.   metoprolol tartrate 25 MG tablet Commonly known as:  LOPRESSOR Place 0.5 tablets (12.5 mg total) into feeding tube 2 (two) times daily. FOR A-FIB/HOLD FOR PULSE LESS THAN 55 OR SBP <90   potassium chloride 20 MEQ/15ML (10%) Soln Place 20 mEq into feeding tube every morning.    Protein Powd Place 1 scoop into feeding tube every 6 (six) hours. FOR WOUND HEALING   Senna 8.8 MG/5ML Syrp Place 8.6 mg into feeding tube 2 (two) times daily. FOR CONSTIPATION   vancomycin 500 mg in sodium chloride 0.9 % 100 mL Inject 500 mg into the vein every 12 (twelve) hours.        Discharged Condition: stable  Greater than 35 minutes of time have been dedicated to discharge assessment, planning and discharge instructions.   Signed: Joneen Roach, AGACNP-BC [place] Pulmonology/Critical Care Pager [phone] or [phone]  01/26/2016 10:20 AM

## 2016-01-26 NOTE — NC FL2 (Signed)
[place] LEVEL OF CARE SCREENING TOOL     IDENTIFICATION  Patient Name: Andrew Reynolds Birthdate: [birth year] Sex: male Admission Date (Current Location): 01/24/2016  [facility] and [place] Number:  Herbalist and Address:  [facility] [place] [address], [place],  [address]      Provider Number: [id number]  Attending Physician Name and Address:  Raylene Miyamoto, MD  Relative Name and Phone Number:  Zaidan Keeble [facility]) [phone]    Current Level of Care: Hospital Recommended Level of Care: [place] Prior Approval Number:    Date Approved/Denied:   PASRR Number: [id number]  Discharge Plan: SNF    Current Diagnoses: Patient Active Problem List   Diagnosis Date Noted  . Hypoxia 01/24/2016  . VAP (ventilator-associated pneumonia) ([place])   . Tracheostomy status ([place])   . Quadriplegia and quadriparesis ([place])   . Respiratory failure ([place]) 01/19/2016  . Enterococcal septicemia ([place]) 01/19/2016  . SSS (sick sinus syndrome) ([place]) 01/19/2016  . Tetraplegic ([place]) 01/19/2016  . Encounter for central line placement   . Acute on chronic respiratory failure with hypoxemia ([place])   . Glasgow coma scale total score 3-8 ([place])   . Coagulase negative Staphylococcus bacteremia   . Pneumonia of left lower lobe due to Pseudomonas species ([place])   . Serratia infection   . ESBL E. coli carrier   . Pressure ulcer 01/17/2016  . Septic shock ([place]) 01/16/2016    Orientation RESPIRATION BLADDER Height & Weight     Self, Situation, Place  Tracheostomy, O2 (55m cuffed; Distal FiO2 50%) Incontinent, Indwelling catheter Weight: 231 lb 0.7 oz (104.8 kg) Height:     BEHAVIORAL SYMPTOMS/MOOD NEUROLOGICAL BOWEL NUTRITION STATUS   (NONE)  (NONE) Incontinent Feeding tube  AMBULATORY STATUS COMMUNICATION OF NEEDS Skin   Total Care Non-Verbally PU Stage and Appropriate Care, Other (Comment) (unstageable pressure ulcer 2 wounds  lower right buttocks; deep tissue pressure ulcer anterior foot left heel) PU Stage 1 Dressing: Daily (intact skin w/ dime size red area below where PICC line port sits)   PU Stage 3 Dressing: Daily (Wounds in 2 locations near sacrum)                 Personal Care Assistance Level of Assistance  Total care, Dressing, Feeding, Bathing Bathing Assistance: Maximum assistance Feeding assistance: Maximum assistance Dressing Assistance: Maximum assistance Total Care Assistance: Maximum assistance   Functional Limitations Info  Sight, Hearing, Speech Sight Info: Adequate Hearing Info: Adequate Speech Info: Impaired    SPECIAL CARE FACTORS FREQUENCY                       Contractures Contractures Info: Not present    Additional Factors Info  Code Status, Allergies, Isolation Precautions, Suctioning Needs Code Status Info: FULL Allergies Info: Sulfa Antibiotics     Isolation Precautions Info: Contact Precautions Suctioning Needs: [place]; Tracheal Catheter-secretion amount small    Current Medications (01/26/2016):  This is the current hospital active medication list Current Facility-Administered Medications  Medication Dose Route Frequency Provider Last Rate Last Dose  . acetaminophen (TYLENOL) solution 650 mg  650 mg Per Tube Q6H PRN JJavier Glazier MD   650 mg at 01/25/16 2035  . antiseptic oral rinse solution (CORINZ)  7 mL Mouth Rinse QID MTanda Rockers MD   7 mL at 01/26/16 0331  . chlorhexidine gluconate (SAGE KIT) (PERIDEX) 0.12 % solution 15 mL  15 mL Mouth Rinse  BID Tanda Rockers, MD   15 mL at 01/26/16 0801  . collagenase (SANTYL) ointment   Topical Daily Raylene Miyamoto, MD      . enoxaparin (LOVENOX) injection 40 mg  40 mg Subcutaneous QHS Tasrif Ahmed, MD   40 mg at 01/25/16 2204  . famotidine (PEPCID) 40 MG/5ML suspension 20 mg  20 mg Per Tube BID Dellia Nims, MD   20 mg at 01/26/16 0942  . feeding supplement (PRO-STAT SUGAR FREE 64) liquid 30 mL  30  mL Oral BID Raylene Miyamoto, MD   30 mL at 01/26/16 0942  . feeding supplement (VITAL HIGH PROTEIN) liquid 1,000 mL  1,000 mL Per Tube Q24H Rush Farmer, MD   1,000 mL at 01/26/16 1100  . ferrous sulfate 300 (60 Fe) MG/5ML syrup 300 mg  300 mg Per Tube BID Dellia Nims, MD   300 mg at 01/26/16 0942  . free water 250 mL  250 mL Per Tube Q6H Rush Farmer, MD   250 mL at 01/26/16 1015  . gabapentin (NEURONTIN) capsule 300 mg  300 mg Oral TID Dellia Nims, MD   300 mg at 01/26/16 0942  . ipratropium-albuterol (DUONEB) 0.5-2.5 (3) MG/3ML nebulizer solution 3 mL  3 mL Nebulization Q6H Tasrif Ahmed, MD   3 mL at 01/26/16 0744  . sennosides (SENOKOT) 8.8 MG/5ML syrup 5 mL  5 mL Per Tube BID Raylene Miyamoto, MD   5 mL at 01/26/16 0942  . vancomycin (VANCOCIN) 500 mg in sodium chloride 0.9 % 100 mL IVPB  500 mg Intravenous Q12H Grace Bushy Minor, NP 100 mL/hr at 01/26/16 0329 500 mg at 01/26/16 6962     Discharge Medications: Please see discharge summary for a list of discharge medications.  Relevant Imaging Results:  Relevant Lab Results:   Additional Information SS [id number]  Judeth Horn, LCSW

## 2016-01-27 LAB — CULTURE, RESPIRATORY W GRAM STAIN

## 2016-01-27 LAB — CULTURE, RESPIRATORY

## 2016-01-30 LAB — CULTURE, BLOOD (ROUTINE X 2)
CULTURE: NO GROWTH
CULTURE: NO GROWTH

## 2016-02-14 ENCOUNTER — Emergency Department (HOSPITAL_COMMUNITY)
Admission: EM | Admit: 2016-02-14 | Discharge: 2016-02-14 | Disposition: A | Discharge status: Home / Self Care | Payer: Medicare Other | Attending: Emergency Medicine | Admitting: Emergency Medicine

## 2016-02-14 ENCOUNTER — Emergency Department (HOSPITAL_COMMUNITY): Payer: Medicare Other

## 2016-02-14 DIAGNOSIS — Z79899 Other long term (current) drug therapy: Secondary | ICD-10-CM | POA: Diagnosis not present

## 2016-02-14 DIAGNOSIS — R0600 Dyspnea, unspecified: Secondary | ICD-10-CM | POA: Insufficient documentation

## 2016-02-14 DIAGNOSIS — I10 Essential (primary) hypertension: Secondary | ICD-10-CM | POA: Insufficient documentation

## 2016-02-14 DIAGNOSIS — R0603 Acute respiratory distress: Secondary | ICD-10-CM

## 2016-02-14 LAB — COMPREHENSIVE METABOLIC PANEL
ALBUMIN: 2 g/dL — AB (ref 3.5–5.0)
ALK PHOS: 67 U/L (ref 38–126)
ALT: 11 U/L — AB (ref 17–63)
AST: 17 U/L (ref 15–41)
Anion gap: 8 (ref 5–15)
BILIRUBIN TOTAL: 0.5 mg/dL (ref 0.3–1.2)
BUN: 19 mg/dL (ref 6–20)
CALCIUM: 8.6 mg/dL — AB (ref 8.9–10.3)
CO2: 29 mmol/L (ref 22–32)
CREATININE: 0.35 mg/dL — AB (ref 0.61–1.24)
Chloride: 97 mmol/L — ABNORMAL LOW (ref 101–111)
GFR calc Af Amer: >60 mL/min (ref >60)
GFR calc non Af Amer: >60 mL/min (ref >60)
GLUCOSE: 89 mg/dL (ref 65–99)
Potassium: 3.5 mmol/L (ref 3.5–5.1)
SODIUM: 134 mmol/L — AB (ref 135–145)
Total Protein: 6.8 g/dL (ref 6.5–8.1)

## 2016-02-14 LAB — CBC WITH DIFFERENTIAL/PLATELET
BASOS PCT: 0 %
Basophils Absolute: 0 10*3/uL (ref 0.0–0.1)
EOS PCT: 1 %
Eosinophils Absolute: 0.1 10*3/uL (ref 0.0–0.7)
HEMATOCRIT: 33 % — AB (ref 39.0–52.0)
HEMOGLOBIN: 9.7 g/dL — AB (ref 13.0–17.0)
LYMPHS ABS: 0.5 10*3/uL — AB (ref 0.7–4.0)
LYMPHS PCT: 5 %
MCH: 28.5 pg (ref 26.0–34.0)
MCHC: 29.4 g/dL — ABNORMAL LOW (ref 30.0–36.0)
MCV: 97.1 fL (ref 78.0–100.0)
MONOS PCT: 5 %
Monocytes Absolute: 0.5 10*3/uL (ref 0.1–1.0)
NEUTROS ABS: 9.4 10*3/uL — AB (ref 1.7–7.7)
Neutrophils Relative %: 89 %
Platelets: 79 10*3/uL — ABNORMAL LOW (ref 150–400)
RBC: 3.4 MIL/uL — ABNORMAL LOW (ref 4.22–5.81)
RDW: 22 % — ABNORMAL HIGH (ref 11.5–15.5)
WBC: 10.5 10*3/uL (ref 4.0–10.5)

## 2016-02-14 LAB — TROPONIN I

## 2016-02-14 LAB — BRAIN NATRIURETIC PEPTIDE: B Natriuretic Peptide: 428.5 pg/mL — ABNORMAL HIGH (ref 0.0–100.0)

## 2016-02-14 MED ORDER — ALBUTEROL SULFATE (2.5 MG/3ML) 0.083% IN NEBU
5.0000 mg | INHALATION_SOLUTION | Freq: Once | RESPIRATORY_TRACT | Status: AC
  Administered 2016-02-14: 5 mg via RESPIRATORY_TRACT
  Filled 2016-02-14: qty 6

## 2016-02-14 NOTE — ED Notes (Signed)
[facility] here to transport patient back to [facility].

## 2016-02-14 NOTE — ED Provider Notes (Signed)
[facility] DEPT Provider Note   CSN: [id number] Arrival date & time: 02/14/16  1358     History   Chief Complaint Chief Complaint  Patient presents with  . Respiratory Distress    HPI Trevis Eden is a 70 y.o. male.  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  The pt was in the hospital most recently from 8/13 to 8/15 for recurrent pleural effusions, bacteremia from coag neg staph, and ESBL klebsiella in the urine.  The pt was d/c'd back to [facility] after d/c.  The pt was there today when he had an episode where he turned blue.  This was brief and pt is back to his normal now. The pt is unable to give any hx.      Past Medical History:  Diagnosis Date  . A-fib (HCC)   . Acute embolism and thrombosis of other specified deep vein of lower extremity, bilateral (HCC)   . Anemia, unspecified   . Blind right eye   . Chronic pain   . Constipation   . 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 Dysphagia, oropharyngeal phase   . Essential (primary) hypertension   . GERD (gastroesophageal reflux disease)   . History of falling   . Hypokalemia   . Insomnia, unspecified   . Muscle weakness   . Osteoporosis   . Other voice and resonance disorders   . Pleural effusion   . Pneumonia   . Pressure ulcer of buttock, unstageable (HCC)    Right  . Quadriplegia, C1-C4 complete (HCC)   . Respiratory failure, acute and chronic (HCC)   . Rheumatoid arthritis (HCC)   . Unspecified protein-calorie malnutrition (HCC)   . Urinary tract infection, site not specified   . Urine retention     Patient Active Problem List   Diagnosis Date Noted  . Hypoxia 01/24/2016  . VAP (ventilator-associated pneumonia) (HCC)   . Tracheostomy status (HCC)   . Quadriplegia and quadriparesis (HCC)   . Respiratory failure (HCC) 01/19/2016  . Enterococcal septicemia (HCC) 01/19/2016  . SSS (sick sinus syndrome) (HCC) 01/19/2016  . Tetraplegic (HCC) 01/19/2016    . Encounter for central line placement   . Acute on chronic respiratory failure with hypoxemia (HCC)   . Glasgow coma scale total score 3-8 (HCC)   . Coagulase negative Staphylococcus bacteremia   . Pneumonia of left lower lobe due to Pseudomonas species (HCC)   . Serratia infection   . ESBL E. coli carrier   . Pressure ulcer 01/17/2016  . Septic shock (HCC) 01/16/2016    Past Surgical History:  Procedure Laterality Date  . TRACHEOSTOMY         Home Medications    Prior to Admission medications   Medication Sig Start Date End Date Taking? Authorizing Provider  acetaminophen (TYLENOL) 325 MG tablet Place 325 mg into feeding tube every 8 (eight) hours as needed (for pain).    Yes Historical Provider, MD  acetaZOLAMIDE (DIAMOX) 250 MG tablet Place 250 mg into feeding tube every 12 (twelve) hours. FOR FLUID RETENTION   Yes Historical Provider, MD  chlorhexidine (PERIDEX) 0.12 % solution Use as directed 15 mLs in the mouth or throat 2 (two) times daily. FOR TRACH   Yes Historical Provider, MD  enoxaparin (LOVENOX) 40 MG/0.4ML injection Inject 0.4 mLs (40 mg total) into the skin at bedtime. 01/26/16  Yes Duayne Cal, NP  famotidine (PEPCID) 40 MG/5ML suspension Place 2.5 mLs (20 mg total)  into feeding tube 2 (two) times daily. 01/22/16  Yes Jeanella CrazeBrandi L Ollis, NP  ferrous sulfate 300 (60 Fe) MG/5ML syrup Place 300 mg into feeding tube 2 (two) times daily.   Yes Historical Provider, MD  gabapentin (NEURONTIN) 100 MG capsule Place 3 capsules (300 mg total) into feeding tube 3 (three) times daily. FOR PAIN 01/22/16  Yes Jeanella CrazeBrandi L Ollis, NP  ipratropium-albuterol (DUONEB) 0.5-2.5 (3) MG/3ML SOLN Take 3 mLs by nebulization every 6 (six) hours. RELATED TO ACUTE AND CHRONIC RESPIRATORY FAILURE, UNSPECIFIED WHETHER WITH HYPOXIA OR HYPERCAPNIA   Yes Historical Provider, MD  LACTOBACILLUS RHAMNOSUS, GG, PO 1 capsule by PEG Tube route every 12 (twelve) hours.    Yes Historical Provider, MD  metoprolol  tartrate (LOPRESSOR) 25 MG tablet Place 0.5 tablets (12.5 mg total) into feeding tube 2 (two) times daily. FOR A-FIB/HOLD FOR PULSE LESS THAN 55 OR SBP <90 01/22/16  Yes Jeanella CrazeBrandi L Ollis, NP  potassium chloride 20 MEQ/15ML (10%) SOLN Place 20 mEq into feeding tube every morning.   Yes Historical Provider, MD  Protein POWD Place 1 scoop into feeding tube every 6 (six) hours. FOR WOUND HEALING   Yes Historical Provider, MD  Sennosides (SENNA) 8.8 MG/5ML SYRP Place 8.6 mg into feeding tube 2 (two) times daily. FOR CONSTIPATION   Yes Historical Provider, MD  Amino Acids-Protein Hydrolys (FEEDING SUPPLEMENT, PRO-STAT SUGAR FREE 64,) LIQD Place 30 mLs into feeding tube daily. Patient not taking: Reported on 01/24/2016 01/22/16   Jeanella CrazeBrandi L Ollis, NP  collagenase (SANTYL) ointment Apply topically daily. Apply Santyl to right buttock wounds Q day, then cover with moist fluffed 2X2 and foam dressing.  (Change foam dressings Q 5 days or PRN soiling.) Patient not taking: Reported on 01/24/2016 01/22/16   Jeanella CrazeBrandi L Ollis, NP  Nutritional Supplements (FEEDING SUPPLEMENT, VITAL AF 1.2 CAL,) LIQD Place 1,000 mLs into feeding tube continuous. Patient not taking: Reported on 01/24/2016 01/22/16   Jeanella CrazeBrandi L Ollis, NP  vancomycin 500 mg in sodium chloride 0.9 % 100 mL Inject 500 mg into the vein every 12 (twelve) hours. Patient not taking: Reported on 02/14/2016 01/23/16   Jeanella CrazeBrandi L Ollis, NP  Water For Irrigation, Sterile (FREE WATER) SOLN Place 250 mLs into feeding tube every 4 (four) hours. Patient not taking: Reported on 01/24/2016 01/22/16   Jeanella CrazeBrandi L Ollis, NP    Family History No family history on file.  Social History Social History  Substance Use Topics  . Smoking status: Not on file  . Smokeless tobacco: Not on file  . Alcohol use Not on file     Allergies   Sulfa antibiotics   Review of Systems Review of Systems  Respiratory: Positive for shortness of breath.   All other systems reviewed and are  negative.    Physical Exam Updated Vital Signs BP (!) 84/60   Pulse 76   Temp 97.3 F (36.3 C) (Oral)   Resp (!) 31   SpO2 98%   Physical Exam  Constitutional: He appears well-developed and well-nourished.  HENT:  Head: Normocephalic and atraumatic.  Right Ear: External ear normal.  Left Ear: External ear normal.  Nose: Nose normal.  Mouth/Throat: Oropharynx is clear and moist.  Eyes: Conjunctivae are normal. Pupils are equal, round, and reactive to light.  Neck: Normal range of motion.  Trach in place.  No air leak noted.  Cardiovascular: Normal rate, regular rhythm, normal heart sounds and intact distal pulses.   Pulmonary/Chest: He has wheezes.  Pt is on ventilator  per [facility] settings  Abdominal: Soft. Bowel sounds are normal.  Musculoskeletal: Normal range of motion.  Neurological: He is alert.  Quadriplegic secondary to cervical surgery  Skin: Skin is warm and dry.  Nursing note and vitals reviewed.    ED Treatments / Results  Labs (all labs ordered are listed, but only abnormal results are displayed) Labs Reviewed  CBC WITH DIFFERENTIAL/PLATELET - Abnormal; Notable for the following:       Result Value   RBC 3.40 (*)    Hemoglobin 9.7 (*)    HCT 33.0 (*)    MCHC 29.4 (*)    RDW 22.0 (*)    Platelets 79 (*)    Neutro Abs 9.4 (*)    Lymphs Abs 0.5 (*)    All other components within normal limits  BRAIN NATRIURETIC PEPTIDE - Abnormal; Notable for the following:    B Natriuretic Peptide 428.5 (*)    All other components within normal limits  COMPREHENSIVE METABOLIC PANEL - Abnormal; Notable for the following:    Sodium 134 (*)    Chloride 97 (*)    Creatinine, Ser 0.35 (*)    Calcium 8.6 (*)    Albumin 2.0 (*)    ALT 11 (*)    All other components within normal limits  TROPONIN I  URINALYSIS, ROUTINE W REFLEX MICROSCOPIC (NOT AT [facility])    EKG  EKG Interpretation  Date/Time:  Sunday February 14 2016 16:02:49 EDT Ventricular Rate:  114 PR  Interval:    QRS Duration: 111 QT Interval:  383 QTC Calculation: 442 R Axis:   145 Text Interpretation:  Atrial fibrillation Right axis deviation Low voltage, precordial leads RSR' in V1 or V2, probably normal variant Probable anterolateral infarct, old Artifact in lead(s) I II III aVR aVL pt is tremulous Confirmed by Particia Nearing MD, Ledger Heindl ([id number]) on 02/14/2016 5:09:08 PM       Radiology Dg Chest Port 1 View  Result Date: 02/14/2016 CLINICAL DATA:  Pt. Unresponsive; recovering from pneumonia. EXAM: PORTABLE CHEST 1 VIEW COMPARISON:  01/24/2016 FINDINGS: Right IJ central line tip overlies the level of this lower superior vena cava. There are bilateral pleural effusions and hazy densities throughout the lungs bilaterally, similar in appearance to prior study. The heart margins are obscured by a lung opacities. IMPRESSION: Persistent significant bilateral lung opacities and bilateral pleural effusions. Electronically Signed   By: Norva Pavlov M.D.   On: 02/14/2016 16:04    Procedures Procedures (including critical care time)  Medications Ordered in ED Medications  albuterol (PROVENTIL) (2.5 MG/3ML) 0.083% nebulizer solution 5 mg (5 mg Nebulization Given 02/14/16 1628)     Initial Impression / Assessment and Plan / ED Course  I have reviewed the triage vital signs and the nursing notes.  Pertinent labs & imaging results that were available during my care of the patient were reviewed by me and considered in my medical decision making (see chart for details).  Clinical Course   Pt has been doing well here.  He has had no further episodes.  I question whether pt had a mucous plug.  Pt's hgb has improved since discharge and his sodium has also improved.  Pt is on lovenox to prevent dvt and pe. I don't think there is anything that we can do here for pt that [facility] can't do.  Final Clinical Impressions(s) / ED Diagnoses   Final diagnoses:  Respiratory distress    New Prescriptions New  Prescriptions   No medications on file  Jacalyn Lefevre, MD 02/14/16 [phone]

## 2016-02-14 NOTE — Discharge Instructions (Signed)
F/u with pcp.

## 2016-02-14 NOTE — ED Notes (Signed)
Pt needs ekg  Which is a problem  Machine not working well

## 2016-02-14 NOTE — ED Notes (Signed)
pts eyes open no response to conversation  No eye contact non movement

## 2016-02-14 NOTE — ED Notes (Signed)
Pt waiting for blood tests to result resp has been  At the bedside

## 2016-02-14 NOTE — ED Triage Notes (Signed)
PT transferred from [facility] for eval of Resp. Distress. Staff reported when PT was turned today for wound care he turned blue. Staff reported to have  Suctioned a large amount of oral mucous . Pt color returned to normal after. Report that Pt is recovering from PNA.

## 2016-02-14 NOTE — Progress Notes (Signed)
Received patient from [facility] via [facility]. Pt had respiratory arrest episode at [facility]. Placed on hospital vent with settings as per the setting at [facility]. Will cont to [facility]

## 2016-02-14 NOTE — ED Notes (Signed)
Report called to Rosanna from [facility].  [facility] notified of need for transport.

## 2016-02-14 NOTE — ED Notes (Signed)
Transported by [facility] back to [facility] at this time.

## 2016-02-14 NOTE — Progress Notes (Signed)
RT NOTE:  Pt placed on [facility] transport Vent for transport back to [facility].

## 2016-02-21 ENCOUNTER — Emergency Department (HOSPITAL_COMMUNITY): Payer: Medicare Other

## 2016-02-21 ENCOUNTER — Encounter (HOSPITAL_COMMUNITY): Payer: Self-pay | Admitting: Emergency Medicine

## 2016-02-21 ENCOUNTER — Emergency Department (HOSPITAL_COMMUNITY)
Admission: EM | Admit: 2016-02-21 | Discharge: 2016-02-21 | Disposition: A | Discharge status: Home / Self Care | Payer: Medicare Other | Source: Home / Self Care | Attending: Emergency Medicine | Admitting: Emergency Medicine

## 2016-02-21 DIAGNOSIS — J9621 Acute and chronic respiratory failure with hypoxia: Secondary | ICD-10-CM | POA: Diagnosis present

## 2016-02-21 DIAGNOSIS — G8250 Quadriplegia, unspecified: Secondary | ICD-10-CM | POA: Diagnosis present

## 2016-02-21 DIAGNOSIS — I248 Other forms of acute ischemic heart disease: Secondary | ICD-10-CM | POA: Diagnosis present

## 2016-02-21 DIAGNOSIS — I10 Essential (primary) hypertension: Secondary | ICD-10-CM

## 2016-02-21 DIAGNOSIS — D649 Anemia, unspecified: Secondary | ICD-10-CM | POA: Diagnosis present

## 2016-02-21 DIAGNOSIS — R6521 Severe sepsis with septic shock: Secondary | ICD-10-CM | POA: Diagnosis present

## 2016-02-21 DIAGNOSIS — R570 Cardiogenic shock: Secondary | ICD-10-CM | POA: Diagnosis present

## 2016-02-21 DIAGNOSIS — J810 Acute pulmonary edema: Secondary | ICD-10-CM | POA: Diagnosis present

## 2016-02-21 DIAGNOSIS — E43 Unspecified severe protein-calorie malnutrition: Secondary | ICD-10-CM | POA: Diagnosis present

## 2016-02-21 DIAGNOSIS — J189 Pneumonia, unspecified organism: Secondary | ICD-10-CM | POA: Diagnosis present

## 2016-02-21 DIAGNOSIS — I469 Cardiac arrest, cause unspecified: Secondary | ICD-10-CM | POA: Diagnosis present

## 2016-02-21 DIAGNOSIS — E871 Hypo-osmolality and hyponatremia: Secondary | ICD-10-CM

## 2016-02-21 DIAGNOSIS — Z7901 Long term (current) use of anticoagulants: Secondary | ICD-10-CM

## 2016-02-21 DIAGNOSIS — Y95 Nosocomial condition: Secondary | ICD-10-CM | POA: Diagnosis present

## 2016-02-21 DIAGNOSIS — G931 Anoxic brain damage, not elsewhere classified: Secondary | ICD-10-CM | POA: Diagnosis present

## 2016-02-21 DIAGNOSIS — E8779 Other fluid overload: Secondary | ICD-10-CM | POA: Insufficient documentation

## 2016-02-21 DIAGNOSIS — L89300 Pressure ulcer of unspecified buttock, unstageable: Secondary | ICD-10-CM | POA: Diagnosis present

## 2016-02-21 DIAGNOSIS — Z930 Tracheostomy status: Secondary | ICD-10-CM | POA: Diagnosis not present

## 2016-02-21 DIAGNOSIS — E872 Acidosis: Secondary | ICD-10-CM | POA: Diagnosis present

## 2016-02-21 DIAGNOSIS — A419 Sepsis, unspecified organism: Secondary | ICD-10-CM | POA: Diagnosis present

## 2016-02-21 DIAGNOSIS — R0600 Dyspnea, unspecified: Secondary | ICD-10-CM | POA: Insufficient documentation

## 2016-02-21 DIAGNOSIS — M069 Rheumatoid arthritis, unspecified: Secondary | ICD-10-CM | POA: Diagnosis present

## 2016-02-21 DIAGNOSIS — L89150 Pressure ulcer of sacral region, unstageable: Secondary | ICD-10-CM | POA: Diagnosis present

## 2016-02-21 DIAGNOSIS — E875 Hyperkalemia: Secondary | ICD-10-CM | POA: Diagnosis present

## 2016-02-21 LAB — COMPREHENSIVE METABOLIC PANEL
ALT: 18 U/L (ref 17–63)
AST: 24 U/L (ref 15–41)
Albumin: 1.7 g/dL — ABNORMAL LOW (ref 3.5–5.0)
Alkaline Phosphatase: 131 U/L — ABNORMAL HIGH (ref 38–126)
Anion gap: 9 (ref 5–15)
BILIRUBIN TOTAL: 0.6 mg/dL (ref 0.3–1.2)
BUN: 29 mg/dL — AB (ref 6–20)
CALCIUM: 8.3 mg/dL — AB (ref 8.9–10.3)
CO2: 25 mmol/L (ref 22–32)
CREATININE: 0.56 mg/dL — AB (ref 0.61–1.24)
Chloride: 94 mmol/L — ABNORMAL LOW (ref 101–111)
GFR calc Af Amer: >60 mL/min (ref >60)
Glucose, Bld: 102 mg/dL — ABNORMAL HIGH (ref 65–99)
Potassium: 3.3 mmol/L — ABNORMAL LOW (ref 3.5–5.1)
Sodium: 128 mmol/L — ABNORMAL LOW (ref 135–145)
TOTAL PROTEIN: 8.2 g/dL — AB (ref 6.5–8.1)

## 2016-02-21 LAB — I-STAT ARTERIAL BLOOD GAS, ED
Acid-base deficit: 2 mmol/L (ref 0.0–2.0)
BICARBONATE: 25.5 mmol/L (ref 20.0–28.0)
O2 Saturation: 93 %
PCO2 ART: 53 mmHg — AB (ref 32.0–48.0)
PO2 ART: 73 mmHg — AB (ref 83.0–108.0)
Patient temperature: 96
TCO2: 27 mmol/L (ref 0–100)
pH, Arterial: 7.282 — ABNORMAL LOW (ref 7.350–7.450)

## 2016-02-21 LAB — CBC WITH DIFFERENTIAL/PLATELET
BASOS PCT: 0 %
Basophils Absolute: 0 10*3/uL (ref 0.0–0.1)
EOS ABS: 0 10*3/uL (ref 0.0–0.7)
Eosinophils Relative: 0 %
HCT: 34.6 % — ABNORMAL LOW (ref 39.0–52.0)
HEMOGLOBIN: 10.1 g/dL — AB (ref 13.0–17.0)
LYMPHS PCT: 3 %
Lymphs Abs: 0.2 10*3/uL — ABNORMAL LOW (ref 0.7–4.0)
MCH: 28.3 pg (ref 26.0–34.0)
MCHC: 29.2 g/dL — ABNORMAL LOW (ref 30.0–36.0)
MCV: 96.9 fL (ref 78.0–100.0)
MONO ABS: 0.4 10*3/uL (ref 0.1–1.0)
Monocytes Relative: 5 %
NEUTROS PCT: 92 %
Neutro Abs: 7.6 10*3/uL (ref 1.7–7.7)
Platelets: 217 10*3/uL (ref 150–400)
RBC: 3.57 MIL/uL — AB (ref 4.22–5.81)
RDW: 19.2 % — ABNORMAL HIGH (ref 11.5–15.5)
WBC MORPHOLOGY: INCREASED
WBC: 8.2 10*3/uL (ref 4.0–10.5)

## 2016-02-21 LAB — I-STAT CG4 LACTIC ACID, ED: Lactic Acid, Venous: 1.41 mmol/L (ref 0.5–1.9)

## 2016-02-21 LAB — BRAIN NATRIURETIC PEPTIDE: B Natriuretic Peptide: 489.6 pg/mL — ABNORMAL HIGH (ref 0.0–100.0)

## 2016-02-21 LAB — I-STAT TROPONIN, ED: TROPONIN I, POC: 0 ng/mL (ref 0.00–0.08)

## 2016-02-21 LAB — TSH: TSH: 3.336 u[IU]/mL (ref 0.350–4.500)

## 2016-02-21 NOTE — ED Notes (Signed)
Placed new urine drainage bag to cath.

## 2016-02-21 NOTE — ED Notes (Signed)
Pt transported back to [facility] hospital via [facility]

## 2016-02-21 NOTE — ED Provider Notes (Signed)
[facility] Provider Note   CSN: [id number] Arrival date & time: 02/21/16  0900     History   Chief Complaint Chief Complaint  Patient presents with  . Altered Mental Status    HPI Andrew Reynolds is a 70 y.o. male.  Patient is a 70 year old male with multiple medical problems including quadriplegia developed after a fall while on anticoagulation with multiple spinal surgeries who is currently trach dependent with PEG tube living in an LTAC presenting today with change in mental status, fluctuating blood pressure and desaturation. Patient recently admitted and discharged after having hospital-acquired pneumonia and UTI. Was reported that he was started on Lasix yesterday due to diffuse edema but has episodes of bradycardia and hypotension. Normally he can mouth words and follow commands but today he cannot. Temperature has been lower at 95 but no noted fevers. Continue to suction copious amounts of secretions from trach which seems to be baseline since return from the hospital. He is not currently on antibiotics.   The history is provided by the EMS personnel and the nursing home. The history is limited by the condition of the patient.  Altered Mental Status      Past Medical History:  Diagnosis Date  . A-fib (HCC)   . Acute embolism and thrombosis of other specified deep vein of lower extremity, bilateral (HCC)   . Anemia, unspecified   . Blind right eye   . Chronic pain   . Constipation   . 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 Dysphagia, oropharyngeal phase   . Essential (primary) hypertension   . GERD (gastroesophageal reflux disease)   . History of falling   . Hypokalemia   . Insomnia, unspecified   . Muscle weakness   . Osteoporosis   . Other voice and resonance disorders   . Pleural effusion   . Pneumonia   . Pressure ulcer of buttock, unstageable (HCC)    Right  . Quadriplegia, C1-C4 complete (HCC)   . Respiratory failure, acute and  chronic (HCC)   . Rheumatoid arthritis (HCC)   . Unspecified protein-calorie malnutrition (HCC)   . Urinary tract infection, site not specified   . Urine retention     Patient Active Problem List   Diagnosis Date Noted  . Hypoxia 01/24/2016  . VAP (ventilator-associated pneumonia) (HCC)   . Tracheostomy status (HCC)   . Quadriplegia and quadriparesis (HCC)   . Respiratory failure (HCC) 01/19/2016  . Enterococcal septicemia (HCC) 01/19/2016  . SSS (sick sinus syndrome) (HCC) 01/19/2016  . Tetraplegic (HCC) 01/19/2016  . Encounter for central line placement   . Acute on chronic respiratory failure with hypoxemia (HCC)   . Glasgow coma scale total score 3-8 (HCC)   . Coagulase negative Staphylococcus bacteremia   . Pneumonia of left lower lobe due to Pseudomonas species (HCC)   . Serratia infection   . ESBL E. coli carrier   . Pressure ulcer 01/17/2016  . Septic shock (HCC) 01/16/2016    Past Surgical History:  Procedure Laterality Date  . TRACHEOSTOMY         Home Medications    Prior to Admission medications   Medication Sig Start Date End Date Taking? Authorizing Provider  acetaminophen (TYLENOL) 325 MG tablet Place 325 mg into feeding tube every 8 (eight) hours as needed (for pain).     Historical Provider, MD  acetaZOLAMIDE (DIAMOX) 250 MG tablet Place 250 mg into feeding tube every 12 (twelve) hours. FOR FLUID RETENTION  Historical Provider, MD  Amino Acids-Protein Hydrolys (FEEDING SUPPLEMENT, PRO-STAT SUGAR FREE 64,) LIQD Place 30 mLs into feeding tube daily. Patient not taking: Reported on 01/24/2016 01/22/16   Jeanella Craze, NP  chlorhexidine (PERIDEX) 0.12 % solution Use as directed 15 mLs in the mouth or throat 2 (two) times daily. FOR [facility]    Historical Provider, MD  collagenase (SANTYL) ointment Apply topically daily. Apply Santyl to right buttock wounds Q day, then cover with moist fluffed 2X2 and foam dressing.  (Change foam dressings Q 5 days or PRN  soiling.) Patient not taking: Reported on 01/24/2016 01/22/16   Jeanella Craze, NP  enoxaparin (LOVENOX) 40 MG/0.4ML injection Inject 0.4 mLs (40 mg total) into the skin at bedtime. 01/26/16   Duayne Cal, NP  famotidine (PEPCID) 40 MG/5ML suspension Place 2.5 mLs (20 mg total) into feeding tube 2 (two) times daily. 01/22/16   Jeanella Craze, NP  ferrous sulfate 300 (60 Fe) MG/5ML syrup Place 300 mg into feeding tube 2 (two) times daily.    Historical Provider, MD  gabapentin (NEURONTIN) 100 MG capsule Place 3 capsules (300 mg total) into feeding tube 3 (three) times daily. FOR PAIN 01/22/16   Jeanella Craze, NP  ipratropium-albuterol (DUONEB) 0.5-2.5 (3) MG/3ML SOLN Take 3 mLs by nebulization every 6 (six) hours. RELATED TO ACUTE AND CHRONIC RESPIRATORY FAILURE, UNSPECIFIED WHETHER WITH HYPOXIA OR HYPERCAPNIA    Historical Provider, MD  LACTOBACILLUS RHAMNOSUS, GG, PO 1 capsule by PEG Tube route every 12 (twelve) hours.     Historical Provider, MD  metoprolol tartrate (LOPRESSOR) 25 MG tablet Place 0.5 tablets (12.5 mg total) into feeding tube 2 (two) times daily. FOR A-FIB/HOLD FOR PULSE LESS THAN 55 OR SBP <90 01/22/16   Jeanella Craze, NP  Nutritional Supplements (FEEDING SUPPLEMENT, VITAL AF 1.2 CAL,) LIQD Place 1,000 mLs into feeding tube continuous. Patient not taking: Reported on 01/24/2016 01/22/16   Jeanella Craze, NP  potassium chloride 20 MEQ/15ML (10%) SOLN Place 20 mEq into feeding tube every morning.    Historical Provider, MD  Protein POWD Place 1 scoop into feeding tube every 6 (six) hours. FOR WOUND HEALING    Historical Provider, MD  Sennosides (SENNA) 8.8 MG/5ML SYRP Place 8.6 mg into feeding tube 2 (two) times daily. FOR CONSTIPATION    Historical Provider, MD  vancomycin 500 mg in sodium chloride 0.9 % 100 mL Inject 500 mg into the vein every 12 (twelve) hours. Patient not taking: Reported on 02/14/2016 01/23/16   Jeanella Craze, NP  Water For Irrigation, Sterile (FREE WATER) SOLN Place  250 mLs into feeding tube every 4 (four) hours. Patient not taking: Reported on 01/24/2016 01/22/16   Jeanella Craze, NP    Family History No family history on file.  Social History Social History  Substance Use Topics  . Smoking status: Not on file  . Smokeless tobacco: Not on file  . Alcohol use Not on file     Allergies   Sulfa antibiotics   Review of Systems Review of Systems  Unable to perform ROS: Mental status change     Physical Exam Updated Vital Signs BP (!) 75/63   Pulse 108   Temp (!) 96.1 F (35.6 C) (Oral)   Resp 24   SpO2 100%   Physical Exam  Constitutional: He appears well-developed and well-nourished. No distress.  HENT:  Head: Normocephalic and atraumatic.  Mouth/Throat: Oropharynx is clear and moist.  Eyes: Pupils are equal, round,  and reactive to light.  Glass eye on the right. Left eye pupil is 3 mm and reactive  Neck: Normal range of motion. Neck supple. JVD present.  Cardiovascular: Normal rate and intact distal pulses.  An irregularly irregular rhythm present.  No murmur heard. Pulmonary/Chest: Effort normal. No respiratory distress. He has no wheezes. He has rhonchi. He has rales.  Abdominal: Soft. He exhibits distension. There is no tenderness. There is no rebound and no guarding.  Genitourinary:  Genitourinary Comments: Significant scrotal edema  Musculoskeletal: Normal range of motion. He exhibits edema. He exhibits no tenderness.  3+ pitting edema up to the thighs bilaterally. Also edema present on the abdomen. Sacral decubitus wounds present as well as pressure wound on the right lateral calf and foot.  Neurological:  Does wake up and coughed to trach suctioning and appears to have pain response when moving arms but will not follow commands or attempt to communicate  Skin: Skin is warm and dry. No rash noted. No erythema.  Psychiatric: He has a normal mood and affect. His behavior is normal.  Nursing note and vitals  reviewed.    ED Treatments / Results  Labs (all labs ordered are listed, but only abnormal results are displayed) Labs Reviewed  URINE CULTURE  CULTURE, BLOOD (ROUTINE X 2)  CULTURE, BLOOD (ROUTINE X 2)  CBC WITH DIFFERENTIAL/PLATELET  COMPREHENSIVE METABOLIC PANEL  BRAIN NATRIURETIC PEPTIDE  URINALYSIS, ROUTINE W REFLEX MICROSCOPIC (NOT AT [facility])  TSH  I-STAT TROPOININ, ED  I-STAT CG4 LACTIC ACID, ED  I-STAT ARTERIAL BLOOD GAS, ED    EKG  EKG Interpretation None       Radiology No results found.  Procedures Procedures (including critical care time)  Medications Ordered in ED Medications - No data to display   Initial Impression / Assessment and Plan / ED Course  I have reviewed the triage vital signs and the nursing notes.  Pertinent labs & imaging results that were available during my care of the patient were reviewed by me and considered in my medical decision making (see chart for details).  Clinical Course    Patient with multiple medical problems presenting today with altered mental status, episodes of desaturation, hypotension and swelling. Patient has been in and out of the hospital multiple times with hospital-acquired infections both pneumonia and UTI. Also found to have recurrent pleural effusions. Patient is noted to be significantly fluid overloaded on exam with 3+ pitting edema in the lower extremities and evidence of anasarca. No prior history of liver problems. Patient started on Lasix yesterday however normally he is able to communicate by mouthing words and following commands which he cannot do today. Temperature is been 95 per facility which is lower than his norm. Patient is not currently on any antibiotics. He does take Lovenox for DVT. Here patient's temperature is 90.6 with a heart rate of 108 and a blood pressure of 75/63. Given his quadriplegia his blood pressure fluctuates significantly.  Concern for CHF and fluid overload versus new infection  and sepsis versus potential hypothyroidism versus hypercarbia as the cause for his symptoms today. Patient suctioned aggressively with thin yellow secretions.  Pt is up 15Kg from 8/15.  CBC, CMP, troponin, BNP, UA, blood cultures, urine cultures, lactic acid, EKG, chest x-ray, ABG pending.  10:32 AM Chest x-ray concerning for potential worsening pneumonia however concern for pulmonary edema and pleural effusions, CBC with normal white count and stable hemoglobin, i-STAT with mild respiratory acidosis with a pH of 7.28 and a  CO2 of 53, lactic acid normal at 1.4, troponin within normal limits EKG showing atrial flutter rate controlled, CMP today with normal creatinine and new hyponatremia of 128 potassium 3.3 and low albumin, BNP and TSH pending  1:01 PM BNP remains elevated at 500 with a normal TSH. While patient has been here his blood pressure is been between 90 and 100 systolic. Looking back at past records that seems to be his baseline. At this time patient is not displaying any signs of sepsis. Discussed with [facility] Haroldine Laws at [facility] and at this time they feel comfortable continuing managing his care. Discussed with her results of our evaluation. They can continue IV Lasix or antibiotics as they need. They can check x-rays and labs daily. At this time patient does not appear to need pressor support. Final Clinical Impressions(s) / ED Diagnoses   Final diagnoses:  Other fluid overload  Hyponatremia    New Prescriptions New Prescriptions   No medications on file     Gwyneth Sprout, MD 02/21/16 1302

## 2016-02-22 ENCOUNTER — Inpatient Hospital Stay (HOSPITAL_COMMUNITY)
Admission: EM | Admit: 2016-02-22 | Discharge: 2016-03-13 | DRG: 871 | Disposition: E | Payer: Medicare Other | Attending: Pulmonary Disease | Admitting: Pulmonary Disease

## 2016-02-22 ENCOUNTER — Encounter (HOSPITAL_COMMUNITY): Payer: Self-pay | Admitting: Emergency Medicine

## 2016-02-22 ENCOUNTER — Emergency Department (HOSPITAL_COMMUNITY): Payer: Medicare Other

## 2016-02-22 DIAGNOSIS — Z981 Arthrodesis status: Secondary | ICD-10-CM | POA: Diagnosis not present

## 2016-02-22 DIAGNOSIS — R6521 Severe sepsis with septic shock: Secondary | ICD-10-CM | POA: Diagnosis present

## 2016-02-22 DIAGNOSIS — I4891 Unspecified atrial fibrillation: Secondary | ICD-10-CM | POA: Diagnosis present

## 2016-02-22 DIAGNOSIS — L89150 Pressure ulcer of sacral region, unstageable: Secondary | ICD-10-CM | POA: Diagnosis present

## 2016-02-22 DIAGNOSIS — L89300 Pressure ulcer of unspecified buttock, unstageable: Secondary | ICD-10-CM | POA: Diagnosis present

## 2016-02-22 DIAGNOSIS — M069 Rheumatoid arthritis, unspecified: Secondary | ICD-10-CM | POA: Diagnosis present

## 2016-02-22 DIAGNOSIS — E875 Hyperkalemia: Secondary | ICD-10-CM | POA: Diagnosis present

## 2016-02-22 DIAGNOSIS — A419 Sepsis, unspecified organism: Principal | ICD-10-CM | POA: Diagnosis present

## 2016-02-22 DIAGNOSIS — I509 Heart failure, unspecified: Secondary | ICD-10-CM

## 2016-02-22 DIAGNOSIS — Z931 Gastrostomy status: Secondary | ICD-10-CM | POA: Diagnosis not present

## 2016-02-22 DIAGNOSIS — I10 Essential (primary) hypertension: Secondary | ICD-10-CM | POA: Diagnosis present

## 2016-02-22 DIAGNOSIS — J9621 Acute and chronic respiratory failure with hypoxia: Secondary | ICD-10-CM | POA: Diagnosis present

## 2016-02-22 DIAGNOSIS — E162 Hypoglycemia, unspecified: Secondary | ICD-10-CM | POA: Diagnosis present

## 2016-02-22 DIAGNOSIS — I469 Cardiac arrest, cause unspecified: Secondary | ICD-10-CM | POA: Diagnosis present

## 2016-02-22 DIAGNOSIS — Z86718 Personal history of other venous thrombosis and embolism: Secondary | ICD-10-CM | POA: Diagnosis not present

## 2016-02-22 DIAGNOSIS — Z6838 Body mass index (BMI) 38.0-38.9, adult: Secondary | ICD-10-CM

## 2016-02-22 DIAGNOSIS — G8250 Quadriplegia, unspecified: Secondary | ICD-10-CM | POA: Diagnosis present

## 2016-02-22 DIAGNOSIS — D649 Anemia, unspecified: Secondary | ICD-10-CM | POA: Diagnosis present

## 2016-02-22 DIAGNOSIS — J810 Acute pulmonary edema: Secondary | ICD-10-CM | POA: Diagnosis not present

## 2016-02-22 DIAGNOSIS — Y95 Nosocomial condition: Secondary | ICD-10-CM | POA: Diagnosis present

## 2016-02-22 DIAGNOSIS — J189 Pneumonia, unspecified organism: Secondary | ICD-10-CM | POA: Diagnosis present

## 2016-02-22 DIAGNOSIS — Z7901 Long term (current) use of anticoagulants: Secondary | ICD-10-CM

## 2016-02-22 DIAGNOSIS — N319 Neuromuscular dysfunction of bladder, unspecified: Secondary | ICD-10-CM | POA: Diagnosis present

## 2016-02-22 DIAGNOSIS — Z930 Tracheostomy status: Secondary | ICD-10-CM

## 2016-02-22 DIAGNOSIS — E43 Unspecified severe protein-calorie malnutrition: Secondary | ICD-10-CM | POA: Diagnosis present

## 2016-02-22 DIAGNOSIS — K219 Gastro-esophageal reflux disease without esophagitis: Secondary | ICD-10-CM | POA: Diagnosis present

## 2016-02-22 DIAGNOSIS — R570 Cardiogenic shock: Secondary | ICD-10-CM | POA: Diagnosis present

## 2016-02-22 DIAGNOSIS — I248 Other forms of acute ischemic heart disease: Secondary | ICD-10-CM | POA: Diagnosis present

## 2016-02-22 DIAGNOSIS — Z79899 Other long term (current) drug therapy: Secondary | ICD-10-CM

## 2016-02-22 DIAGNOSIS — G931 Anoxic brain damage, not elsewhere classified: Secondary | ICD-10-CM | POA: Diagnosis present

## 2016-02-22 DIAGNOSIS — I482 Chronic atrial fibrillation: Secondary | ICD-10-CM | POA: Diagnosis present

## 2016-02-22 DIAGNOSIS — E872 Acidosis: Secondary | ICD-10-CM | POA: Diagnosis present

## 2016-02-22 DIAGNOSIS — E8729 Other acidosis: Secondary | ICD-10-CM

## 2016-02-22 DIAGNOSIS — H5441 Blindness, right eye, normal vision left eye: Secondary | ICD-10-CM | POA: Diagnosis present

## 2016-02-22 LAB — URINE MICROSCOPIC-ADD ON: SQUAMOUS EPITHELIAL / LPF: NONE SEEN

## 2016-02-22 LAB — URINALYSIS, ROUTINE W REFLEX MICROSCOPIC
Glucose, UA: NEGATIVE mg/dL
KETONES UR: 15 mg/dL — AB
Nitrite: NEGATIVE
Specific Gravity, Urine: 1.021 (ref 1.005–1.030)
pH: 7 (ref 5.0–8.0)

## 2016-02-22 LAB — LACTIC ACID, PLASMA: Lactic Acid, Venous: 7.2 mmol/L (ref 0.5–1.9)

## 2016-02-22 LAB — BLOOD CULTURE ID PANEL (REFLEXED)
Acinetobacter baumannii: NOT DETECTED
CANDIDA GLABRATA: NOT DETECTED
CANDIDA TROPICALIS: NOT DETECTED
Candida albicans: NOT DETECTED
Candida krusei: NOT DETECTED
Candida parapsilosis: NOT DETECTED
ENTEROCOCCUS SPECIES: NOT DETECTED
Enterobacter cloacae complex: NOT DETECTED
Enterobacteriaceae species: NOT DETECTED
Escherichia coli: NOT DETECTED
HAEMOPHILUS INFLUENZAE: NOT DETECTED
KLEBSIELLA PNEUMONIAE: NOT DETECTED
Klebsiella oxytoca: NOT DETECTED
LISTERIA MONOCYTOGENES: NOT DETECTED
METHICILLIN RESISTANCE: DETECTED — AB
NEISSERIA MENINGITIDIS: NOT DETECTED
PROTEUS SPECIES: NOT DETECTED
Pseudomonas aeruginosa: NOT DETECTED
SERRATIA MARCESCENS: NOT DETECTED
STAPHYLOCOCCUS AUREUS BCID: NOT DETECTED
STAPHYLOCOCCUS SPECIES: DETECTED — AB
STREPTOCOCCUS AGALACTIAE: NOT DETECTED
STREPTOCOCCUS SPECIES: NOT DETECTED
Streptococcus pneumoniae: NOT DETECTED
Streptococcus pyogenes: NOT DETECTED

## 2016-02-22 LAB — CBG MONITORING, ED
Glucose-Capillary: 36 mg/dL — CL (ref 65–99)
Glucose-Capillary: 91 mg/dL (ref 65–99)

## 2016-02-22 LAB — CBC WITH DIFFERENTIAL/PLATELET
BASOS ABS: 0 10*3/uL (ref 0.0–0.1)
Basophils Relative: 0 %
EOS ABS: 0.1 10*3/uL (ref 0.0–0.7)
Eosinophils Relative: 1 %
HCT: 33.2 % — ABNORMAL LOW (ref 39.0–52.0)
Hemoglobin: 9 g/dL — ABNORMAL LOW (ref 13.0–17.0)
LYMPHS PCT: 18 %
Lymphs Abs: 1.4 10*3/uL (ref 0.7–4.0)
MCH: 28.4 pg (ref 26.0–34.0)
MCHC: 27.1 g/dL — ABNORMAL LOW (ref 30.0–36.0)
MCV: 104.7 fL — ABNORMAL HIGH (ref 78.0–100.0)
Monocytes Absolute: 0.6 10*3/uL (ref 0.1–1.0)
Monocytes Relative: 8 %
NEUTROS PCT: 73 %
Neutro Abs: 5.4 10*3/uL (ref 1.7–7.7)
PLATELETS: 233 10*3/uL (ref 150–400)
RBC: 3.17 MIL/uL — ABNORMAL LOW (ref 4.22–5.81)
RDW: 19.1 % — AB (ref 11.5–15.5)
WBC MORPHOLOGY: INCREASED
WBC: 7.5 10*3/uL (ref 4.0–10.5)

## 2016-02-22 LAB — COMPREHENSIVE METABOLIC PANEL
ALK PHOS: 146 U/L — AB (ref 38–126)
ALT: 210 U/L — ABNORMAL HIGH (ref 17–63)
ANION GAP: 16 — AB (ref 5–15)
AST: 514 U/L — ABNORMAL HIGH (ref 15–41)
Albumin: 1.6 g/dL — ABNORMAL LOW (ref 3.5–5.0)
BILIRUBIN TOTAL: 0.8 mg/dL (ref 0.3–1.2)
BUN: 36 mg/dL — ABNORMAL HIGH (ref 6–20)
CALCIUM: 8.4 mg/dL — AB (ref 8.9–10.3)
CO2: 19 mmol/L — AB (ref 22–32)
Chloride: 91 mmol/L — ABNORMAL LOW (ref 101–111)
Creatinine, Ser: 1.11 mg/dL (ref 0.61–1.24)
Glucose, Bld: <20 mg/dL — CL (ref 65–99)
Potassium: 5.1 mmol/L (ref 3.5–5.1)
SODIUM: 126 mmol/L — AB (ref 135–145)
TOTAL PROTEIN: 6.1 g/dL — AB (ref 6.5–8.1)

## 2016-02-22 LAB — BLOOD GAS, ARTERIAL
ACID-BASE DEFICIT: 10.4 mmol/L — AB (ref 0.0–2.0)
Bicarbonate: 22.8 mmol/L (ref 20.0–28.0)
Drawn by: 246861
FIO2: 100
LHR: 30 {breaths}/min
O2 SAT: 71.6 %
PEEP: 5 cmH2O
PH ART: 6.826 — AB (ref 7.350–7.450)
Patient temperature: 98.6
Pressure control: 45 cmH2O
pO2, Arterial: 51.1 mmHg — ABNORMAL LOW (ref 83.0–108.0)

## 2016-02-22 LAB — GLUCOSE, CAPILLARY: Glucose-Capillary: 90 mg/dL (ref 65–99)

## 2016-02-22 LAB — MRSA PCR SCREENING: MRSA BY PCR: NEGATIVE

## 2016-02-22 LAB — I-STAT CG4 LACTIC ACID, ED: Lactic Acid, Venous: 6.36 mmol/L (ref 0.5–1.9)

## 2016-02-22 LAB — TROPONIN I: Troponin I: 2.17 ng/mL (ref <0.03)

## 2016-02-22 MED ORDER — MORPHINE SULFATE (PF) 4 MG/ML IV SOLN: 4.0000 mg | Freq: Once | INTRAVENOUS | Status: DC

## 2016-02-22 MED ORDER — MORPHINE BOLUS VIA INFUSION
5.0000 mg | INTRAVENOUS | Status: DC | PRN
  Filled 2016-02-22: qty 20

## 2016-02-22 MED ORDER — ORAL CARE MOUTH RINSE
15.0000 mL | OROMUCOSAL | Status: DC
  Administered 2016-02-22: 15 mL via OROMUCOSAL

## 2016-02-22 MED ORDER — DEXTROSE 10 % IV SOLN
INTRAVENOUS | Status: DC
  Administered 2016-02-22: 16:00:00 via INTRAVENOUS

## 2016-02-22 MED ORDER — HEPARIN SODIUM (PORCINE) 5000 UNIT/ML IJ SOLN
5000.0000 [IU] | Freq: Three times a day (TID) | INTRAMUSCULAR | Status: DC
  Administered 2016-02-22 (×2): 5000 [IU] via SUBCUTANEOUS
  Filled 2016-02-22 (×2): qty 1

## 2016-02-22 MED ORDER — DEXTROSE-NACL 5-0.9 % IV SOLN
INTRAVENOUS | Status: DC
  Administered 2016-02-22: 17:00:00 via INTRAVENOUS

## 2016-02-22 MED ORDER — ALBUTEROL SULFATE (2.5 MG/3ML) 0.083% IN NEBU: 2.5000 mg | INHALATION_SOLUTION | RESPIRATORY_TRACT | Status: DC | PRN

## 2016-02-22 MED ORDER — SODIUM CHLORIDE 0.9 % IV BOLUS (SEPSIS): 1000.0000 mL | Freq: Once | INTRAVENOUS | Status: DC

## 2016-02-22 MED ORDER — PHENYLEPHRINE HCL 10 MG/ML IJ SOLN
30.0000 ug/min | INTRAMUSCULAR | Status: DC
  Administered 2016-02-22: 60 ug/min via INTRAVENOUS
  Administered 2016-02-22: 180 ug/min via INTRAVENOUS
  Administered 2016-02-22: 80 ug/min via INTRAVENOUS
  Filled 2016-02-22 (×2): qty 1

## 2016-02-22 MED ORDER — SODIUM BICARBONATE 8.4 % IV SOLN
100.0000 meq | Freq: Once | INTRAVENOUS | Status: AC
  Administered 2016-02-22: 100 meq via INTRAVENOUS
  Filled 2016-02-22: qty 100

## 2016-02-22 MED ORDER — DEXTROSE 50 % IV SOLN
INTRAVENOUS | Status: AC
  Filled 2016-02-22: qty 50

## 2016-02-22 MED ORDER — SODIUM BICARBONATE 8.4 % IV SOLN
INTRAVENOUS | Status: AC
  Filled 2016-02-22: qty 50

## 2016-02-22 MED ORDER — EPINEPHRINE HCL 0.1 MG/ML IJ SOSY
PREFILLED_SYRINGE | INTRAMUSCULAR | Status: AC | PRN
  Administered 2016-02-22: 1 mg via INTRAVENOUS

## 2016-02-22 MED ORDER — ASPIRIN 300 MG RE SUPP
300.0000 mg | RECTAL | Status: DC
  Filled 2016-02-22: qty 1

## 2016-02-22 MED ORDER — DEXTROSE 5 % IV SOLN
0.0000 ug/min | INTRAVENOUS | Status: DC
  Administered 2016-02-22: 40 ug/min via INTRAVENOUS
  Filled 2016-02-22 (×2): qty 16

## 2016-02-22 MED ORDER — DEXTROSE 50 % IV SOLN
INTRAVENOUS | Status: AC | PRN
  Administered 2016-02-22: 50 mL via INTRAVENOUS

## 2016-02-22 MED ORDER — DEXTROSE 5 % IV SOLN
0.0000 ug/min | INTRAVENOUS | Status: DC
  Administered 2016-02-22: 10 ug/min via INTRAVENOUS
  Filled 2016-02-22 (×2): qty 4

## 2016-02-22 MED ORDER — PHENYLEPHRINE HCL 10 MG/ML IJ SOLN
30.0000 ug/min | INTRAVENOUS | Status: DC
  Administered 2016-02-22: 200 ug/min via INTRAVENOUS
  Filled 2016-02-22 (×2): qty 4

## 2016-02-22 MED ORDER — VANCOMYCIN HCL IN DEXTROSE 1-5 GM/200ML-% IV SOLN
1000.0000 mg | Freq: Two times a day (BID) | INTRAVENOUS | Status: DC
  Filled 2016-02-22 (×2): qty 200

## 2016-02-22 MED ORDER — DOCUSATE SODIUM 50 MG/5ML PO LIQD: 100.0000 mg | Freq: Two times a day (BID) | ORAL | Status: DC | PRN

## 2016-02-22 MED ORDER — CHLORHEXIDINE GLUCONATE 0.12% ORAL RINSE (MEDLINE KIT)
15.0000 mL | Freq: Two times a day (BID) | OROMUCOSAL | Status: DC
  Administered 2016-02-22: 15 mL via OROMUCOSAL

## 2016-02-22 MED ORDER — FENTANYL CITRATE (PF) 100 MCG/2ML IJ SOLN: 50.0000 ug | INTRAMUSCULAR | Status: DC | PRN

## 2016-02-22 MED ORDER — VANCOMYCIN HCL 10 G IV SOLR
2000.0000 mg | Freq: Once | INTRAVENOUS | Status: AC
  Administered 2016-02-22: 2000 mg via INTRAVENOUS
  Filled 2016-02-22: qty 2000

## 2016-02-22 MED ORDER — SODIUM BICARBONATE 8.4 % IV SOLN
100.0000 meq | Freq: Once | INTRAVENOUS | Status: AC
  Administered 2016-02-22: 100 meq via INTRAVENOUS
  Filled 2016-02-22: qty 50

## 2016-02-22 MED ORDER — VASOPRESSIN 20 UNIT/ML IV SOLN
0.0300 [IU]/min | INTRAVENOUS | Status: DC
  Administered 2016-02-22: 0.03 [IU]/min via INTRAVENOUS
  Filled 2016-02-22: qty 2

## 2016-02-22 MED ORDER — DEXTROSE 50 % IV SOLN
INTRAVENOUS | Status: AC
  Administered 2016-02-22: 16:00:00
  Filled 2016-02-22: qty 50

## 2016-02-22 MED ORDER — SODIUM BICARBONATE 8.4 % IV SOLN
200.0000 meq | Freq: Once | INTRAVENOUS | Status: AC
  Administered 2016-02-22: 200 meq via INTRAVENOUS
  Filled 2016-02-22 (×2): qty 200

## 2016-02-22 MED ORDER — SODIUM CHLORIDE 0.9 % IV SOLN: INTRAVENOUS | Status: DC

## 2016-02-22 MED ORDER — HYDROCORTISONE NA SUCCINATE PF 100 MG IJ SOLR
50.0000 mg | Freq: Four times a day (QID) | INTRAMUSCULAR | Status: DC
  Administered 2016-02-22 (×2): 50 mg via INTRAVENOUS
  Filled 2016-02-22 (×2): qty 2

## 2016-02-22 MED ORDER — VANCOMYCIN HCL IN DEXTROSE 1-5 GM/200ML-% IV SOLN: 1000.0000 mg | Freq: Once | INTRAVENOUS | Status: DC

## 2016-02-22 MED ORDER — SODIUM CHLORIDE 0.9 % IV SOLN: 250.0000 mL | INTRAVENOUS | Status: DC | PRN

## 2016-02-22 MED ORDER — FAMOTIDINE 40 MG/5ML PO SUSR
20.0000 mg | Freq: Two times a day (BID) | ORAL | Status: DC
  Administered 2016-02-22 (×2): 20 mg
  Filled 2016-02-22 (×3): qty 2.5

## 2016-02-22 MED ORDER — MORPHINE SULFATE 25 MG/ML IV SOLN
10.0000 mg/h | INTRAVENOUS | Status: DC
  Administered 2016-02-22: 10 mg/h via INTRAVENOUS
  Filled 2016-02-22: qty 10

## 2016-02-22 MED ORDER — PIPERACILLIN-TAZOBACTAM 3.375 G IVPB 30 MIN
3.3750 g | Freq: Once | INTRAVENOUS | Status: AC
  Administered 2016-02-22: 3.375 g via INTRAVENOUS
  Filled 2016-02-22: qty 50

## 2016-02-22 MED ORDER — ASPIRIN 81 MG PO CHEW: 324.0000 mg | CHEWABLE_TABLET | ORAL | Status: DC

## 2016-02-22 MED ORDER — PIPERACILLIN-TAZOBACTAM 3.375 G IVPB
3.3750 g | Freq: Three times a day (TID) | INTRAVENOUS | Status: DC
  Administered 2016-02-22: 3.375 g via INTRAVENOUS
  Filled 2016-02-22 (×3): qty 50

## 2016-02-22 MED ORDER — DEXTROSE 50 % IV SOLN
1.0000 | Freq: Once | INTRAVENOUS | Status: AC
  Administered 2016-02-22: 15:00:00 via INTRAVENOUS

## 2016-02-22 MED FILL — Medication: Qty: 1 | Status: AC

## 2016-02-23 ENCOUNTER — Inpatient Hospital Stay (HOSPITAL_COMMUNITY): Payer: Medicare Other

## 2016-02-23 LAB — BLOOD GAS, ARTERIAL
ACID-BASE DEFICIT: 7.9 mmol/L — AB (ref 0.0–2.0)
Acid-base deficit: 13.2 mmol/L — ABNORMAL HIGH (ref 0.0–2.0)
BICARBONATE: 25.3 mmol/L (ref 20.0–28.0)
Bicarbonate: 20.7 mmol/L (ref 20.0–28.0)
Drawn by: 129711
Drawn by: 449561
FIO2: 100
FIO2: 100
LHR: 30 {breaths}/min
MECHVT: 500 mL
O2 SAT: 69.7 %
O2 Saturation: 60.9 %
PEEP/CPAP: 18 cmH2O
PEEP: 18 cmH2O
PO2 ART: 50.9 mmHg — AB (ref 83.0–108.0)
PO2 ART: 42.2 mmHg — AB (ref 83.0–108.0)
Patient temperature: 99.3
RATE: 18 resp/min
TCO2: 25.2 mmol/L (ref 0–100)
VT: 500 mL
pH, Arterial: 6.85 — CL (ref 7.350–7.450)

## 2016-02-24 LAB — URINE CULTURE: Culture: >=100000 — AB

## 2016-02-24 LAB — CULTURE, BLOOD (ROUTINE X 2)

## 2016-02-25 LAB — CULTURE, RESPIRATORY

## 2016-02-25 LAB — CULTURE, RESPIRATORY W GRAM STAIN

## 2016-02-26 ENCOUNTER — Telehealth: Payer: Self-pay

## 2016-02-26 LAB — CULTURE, BLOOD (ROUTINE X 2): Culture: NO GROWTH

## 2016-02-26 NOTE — Telephone Encounter (Signed)
On 09/14//2017 I received a death certificate from [place] and [employer] ([address] (original). The death certificate will be taken to [facility] ([id number]) today for signature. On March 12, 2016 I received the death certificate back from Doctor Dios. I got the death certificate ready and called the funeral home to let them know the death certificate is ready for pickup.

## 2016-02-27 LAB — CULTURE, BLOOD (ROUTINE X 2)
Culture: NO GROWTH
Culture: NO GROWTH

## 2016-03-13 NOTE — ED Notes (Signed)
EDP aware of pt abnormal labs and vital signs

## 2016-03-13 NOTE — Progress Notes (Signed)
Pt went asystole on monitor and SpO2 read zero. Two RNs, Hillery Jacks and [employer], listened for one full minute for heart sounds, none were heard. Pt was taken off the ventilator and listened again for one full minute no spontaneous breathes were taken. Family at bedside and updated. No pt belongings at bedside. Primary MD notified of expiration.

## 2016-03-13 NOTE — Progress Notes (Signed)
Chaplain prayed with pt's wife at bedside shortly before pt passed. Pt's son and daughter-in-law were present also. An hour earlier pt's wife had asked that I call [employer] priest and I had done so. I called the one who is on-call for September but he did not answer and I left a message. Zorita Pang had not come but pt's wife was glad for me to pray with her.

## 2016-03-13 NOTE — ED Triage Notes (Signed)
Per EMS: pt from [facility] with CPR today for 10-6min of CPR with 2 epi; sts HR was either aystole or PEA; pt with large amounts of secretions from trach; pt is normally vent dependent; pt with large amounts of edema to entire body; pt with central line, foley and PEG tube with trach

## 2016-03-13 NOTE — Progress Notes (Signed)
VENT MODE CHANGED PER NP

## 2016-03-13 NOTE — ED Notes (Signed)
PCCM at bedside. °

## 2016-03-13 NOTE — ED Provider Notes (Signed)
[facility]-EMERGENCY DEPT Provider Note   CSN: [id number] Arrival date & time: 25-Feb-2016  1303     History   Chief Complaint Chief Complaint  Patient presents with  . Cardiac Arrest    HPI Andrew Reynolds is a 70 y.o. male.  70yo M w/ extensive PMH including quadriplegia w/ vent/trach/g-tube dependence, CHF, PE who p/w cardiac arrest. EMS reports that the patient became unresponsive at his nursing facility today and received 10:15 minutes of CPR. He was noted to be in PEA on their monitors and received 2 doses of epinephrine prior to ROSC. He was placed on epi drip which was discontinued at arrival to the ED due to normal blood pressure. EMS notes that he has been here multiple times over the past one week, including yesterday for hypothermia. EMS notes that the patient's son has been adamant about patient's full code status.   The history is provided by the EMS personnel.  Cardiac Arrest    Past Medical History:  Diagnosis Date  . A-fib (HCC)   . Acute embolism and thrombosis of other specified deep vein of lower extremity, bilateral (HCC)   . Anemia, unspecified   . Blind right eye   . Chronic pain   . Constipation   . 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 Dysphagia, oropharyngeal phase   . Essential (primary) hypertension   . GERD (gastroesophageal reflux disease)   . History of falling   . Hypokalemia   . Insomnia, unspecified   . Muscle weakness   . Osteoporosis   . Other voice and resonance disorders   . Pleural effusion   . Pneumonia   . Pressure ulcer of buttock, unstageable (HCC)    Right  . Quadriplegia, C1-C4 complete (HCC)   . Respiratory failure, acute and chronic (HCC)   . Rheumatoid arthritis (HCC)   . Unspecified protein-calorie malnutrition (HCC)   . Urinary tract infection, site not specified   . Urine retention     Patient Active Problem List   Diagnosis Date Noted  . Cardiac arrest (HCC) February 25, 2016  . Hypoxia 01/24/2016  .  VAP (ventilator-associated pneumonia) (HCC)   . Tracheostomy status (HCC)   . Quadriplegia and quadriparesis (HCC)   . Respiratory failure (HCC) 01/19/2016  . Enterococcal septicemia (HCC) 01/19/2016  . SSS (sick sinus syndrome) (HCC) 01/19/2016  . Tetraplegic (HCC) 01/19/2016  . Encounter for central line placement   . Acute on chronic respiratory failure with hypoxemia (HCC)   . Glasgow coma scale total score 3-8 (HCC)   . Coagulase negative Staphylococcus bacteremia   . Pneumonia of left lower lobe due to Pseudomonas species (HCC)   . Serratia infection   . ESBL E. coli carrier   . Pressure ulcer 01/17/2016  . Septic shock (HCC) 01/16/2016    Past Surgical History:  Procedure Laterality Date  . TRACHEOSTOMY         Home Medications    Prior to Admission medications   Medication Sig Start Date End Date Taking? Authorizing Provider  acetaZOLAMIDE (DIAMOX) 250 MG tablet Place 250 mg into feeding tube every 12 (twelve) hours. FOR FLUID RETENTION    Historical Provider, MD  Amino Acids-Protein Hydrolys (FEEDING SUPPLEMENT, PRO-STAT SUGAR FREE 64,) LIQD Place 30 mLs into feeding tube daily. Patient not taking: Reported on 01/24/2016 01/22/16   Jeanella Craze, NP  chlorhexidine (PERIDEX) 0.12 % solution Use as directed 15 mLs in the mouth or throat 2 (two) times daily. FOR [facility]  Historical Provider, MD  collagenase (SANTYL) ointment Apply topically daily. Apply Santyl to right buttock wounds Q day, then cover with moist fluffed 2X2 and foam dressing.  (Change foam dressings Q 5 days or PRN soiling.) Patient taking differently: Apply 1 application topically See admin instructions. Apply to sacrum/right buttock topically every day shift for black slough in wound bed 01/22/16   Jeanella Craze, NP  enoxaparin (LOVENOX) 40 MG/0.4ML injection Inject 0.4 mLs (40 mg total) into the skin at bedtime. 01/26/16   Duayne Cal, NP  famotidine (PEPCID) 40 MG/5ML suspension Place 2.5 mLs (20 mg  total) into feeding tube 2 (two) times daily. 01/22/16   Jeanella Craze, NP  ferrous sulfate 300 (60 Fe) MG/5ML syrup Place 300 mg into feeding tube every 12 (twelve) hours.     Historical Provider, MD  furosemide (LASIX) 10 MG/ML injection Inject 40 mg into the vein See admin instructions. Inject 40 mg intravenously daily for edema - for 4 days - unknown start date    Historical Provider, MD  gabapentin (NEURONTIN) 100 MG capsule Place 3 capsules (300 mg total) into feeding tube 3 (three) times daily. FOR PAIN 01/22/16   Jeanella Craze, NP  ipratropium-albuterol (DUONEB) 0.5-2.5 (3) MG/3ML SOLN Take 3 mLs by nebulization every 6 (six) hours. RELATED TO ACUTE AND CHRONIC RESPIRATORY FAILURE, UNSPECIFIED WHETHER WITH HYPOXIA OR HYPERCAPNIA    Historical Provider, MD  LACTOBACILLUS PO Place 1 tablet into feeding tube every 12 (twelve) hours.    Historical Provider, MD  metoprolol tartrate (LOPRESSOR) 25 MG tablet Place 0.5 tablets (12.5 mg total) into feeding tube 2 (two) times daily. FOR A-FIB/HOLD FOR PULSE LESS THAN 55 OR SBP <90 01/22/16   Jeanella Craze, NP  Nutritional Supplements (FEEDING SUPPLEMENT, VITAL AF 1.2 CAL,) LIQD Place 1,000 mLs into feeding tube continuous. Patient not taking: Reported on 01/24/2016 01/22/16   Jeanella Craze, NP  potassium chloride 20 MEQ/15ML (10%) SOLN Place 20 mEq into feeding tube daily.     Historical Provider, MD  Protein POWD Place 1 scoop into feeding tube every 6 (six) hours. FOR WOUND HEALING    Historical Provider, MD  Sennosides (SENNA) 8.8 MG/5ML SYRP Place 8.6 mg into feeding tube 2 (two) times daily. FOR CONSTIPATION    Historical Provider, MD  vancomycin 500 mg in sodium chloride 0.9 % 100 mL Inject 500 mg into the vein every 12 (twelve) hours. Patient not taking: Reported on 02/14/2016 01/23/16   Jeanella Craze, NP  Water For Irrigation, Sterile (FREE WATER) SOLN Place 250 mLs into feeding tube every 4 (four) hours. Patient not taking: Reported on 01/24/2016  01/22/16   Jeanella Craze, NP    Family History History reviewed. No pertinent family history.  Social History Social History  Substance Use Topics  . Smoking status: Unknown If Ever Smoked  . Smokeless tobacco: Not on file  . Alcohol use No     Allergies   Sulfa antibiotics   Review of Systems Review of Systems  Unable to perform ROS: Patient unresponsive     Physical Exam Updated Vital Signs BP (!) 93/30   Pulse 84   Temp 99.3 F (37.4 C) (Core (Comment))   Resp (!) 28   Wt 263 lb (119.3 kg)   SpO2 (!) 79%   BMI 35.67 kg/m   Physical Exam  Constitutional: No distress.  Pale, unresponsive, being bag ventilated  HENT:  Head: Normocephalic and atraumatic.  Moist mucous membranes  Eyes:  Conjunctivae are normal. Pupils are equal, round, and reactive to light.  Neck: Neck supple.  Cardiovascular: Regular rhythm.  Bradycardia present.   Murmur heard.  Systolic murmur is present with a grade of 3/6  Pulmonary/Chest:  Trach w/ copious thick secretions; crackles and rhonchi b/l; no spontaneous respirations  Abdominal: Soft. He exhibits distension. Bowel sounds are decreased.  Genitourinary:  Genitourinary Comments: Edematous scrotum; foley catheter in place draining dark urine  Musculoskeletal: He exhibits edema.  Significant edema x all 4 ext  Neurological:  GCS 3, unresponsive  Skin: Skin is dry. Capillary refill takes more than 3 seconds. There is pallor.  Cold extremities; diffuse anasarca  Nursing note and vitals reviewed.    ED Treatments / Results  Labs (all labs ordered are listed, but only abnormal results are displayed) Labs Reviewed  COMPREHENSIVE METABOLIC PANEL - Abnormal; Notable for the following:       Result Value   Sodium 126 (*)    Chloride 91 (*)    CO2 19 (*)    Glucose, Bld <20 (*)    BUN 36 (*)    Calcium 8.4 (*)    Total Protein 6.1 (*)    Albumin 1.6 (*)    AST 514 (*)    ALT 210 (*)    Alkaline Phosphatase 146 (*)     Anion gap 16 (*)    All other components within normal limits  CBC WITH DIFFERENTIAL/PLATELET - Abnormal; Notable for the following:    RBC 3.17 (*)    Hemoglobin 9.0 (*)    HCT 33.2 (*)    MCV 104.7 (*)    MCHC 27.1 (*)    RDW 19.1 (*)    All other components within normal limits  BLOOD GAS, ARTERIAL - Abnormal; Notable for the following:    pH, Arterial 6.826 (*)    pO2, Arterial 51.1 (*)    Acid-base deficit 10.4 (*)    All other components within normal limits  I-STAT CG4 LACTIC ACID, ED - Abnormal; Notable for the following:    Lactic Acid, Venous 6.36 (*)    All other components within normal limits  CULTURE, BLOOD (ROUTINE X 2)  CULTURE, BLOOD (ROUTINE X 2)  URINE CULTURE  CULTURE, BLOOD (ROUTINE X 2)  CULTURE, BLOOD (ROUTINE X 2)  CULTURE, RESPIRATORY (NON-EXPECTORATED)  URINE CULTURE  URINALYSIS, ROUTINE W REFLEX MICROSCOPIC (NOT AT [facility])  URINALYSIS, ROUTINE W REFLEX MICROSCOPIC (NOT AT [facility])  BLOOD GAS, ARTERIAL  LACTIC ACID, PLASMA  TROPONIN I  TROPONIN I  TROPONIN I    EKG  EKG Interpretation None       Radiology Dg Chest Port 1 View  Result Date: 07/07/15 CLINICAL DATA:  Followup cardiopulmonary resuscitation EXAM: PORTABLE CHEST 1 VIEW COMPARISON:  02/21/2016 FINDINGS: Endotracheal tube has its tip 6 cm above the carina. Right internal jugular central line has its tip in the SVC above the right atrium. Artifact overlies the chest. There is diffuse pulmonary edema. There are bilateral pleural effusions. No evidence of pneumothorax or visible rib fracture. IMPRESSION: Diffuse pulmonary edema. Bilateral effusions. No pneumothorax or rib fracture. Endotracheal tube and central line satisfactory. Electronically Signed   By: Paulina FusiMark  Shogry M.D.   On: 001/24/17 13:41   Dg Chest Port 1 View  Result Date: 02/21/2016 CLINICAL DATA:  Decreasing oxygen saturations. EXAM: PORTABLE CHEST 1 VIEW COMPARISON:  02/14/2016 FINDINGS: Endotracheal tube tip is 5.5 cm above  the base of the carina. Right IJ central line tip overlies the  mid to distal SVC level. The bilateral asymmetric, left greater than right, airspace disease seen on the current study has progressed slightly in the interval. Persistent bilateral pleural effusions noted. The cardio pericardial silhouette is enlarged. IMPRESSION: Worsening bilateral airspace disease suggest progressing pneumonia. Bilateral pleural effusions. Electronically Signed   By: Kennith Center M.D.   On: 02/21/2016 10:08    Procedures .Critical Care Performed by: Laurence Spates Authorized by: Laurence Spates   Critical care provider statement:    Critical care time (minutes):  45   Critical care time was exclusive of:  Separately billable procedures and treating other patients   Critical care was necessary to treat or prevent imminent or life-threatening deterioration of the following conditions:  Cardiac failure and respiratory failure   Critical care was time spent personally by me on the following activities:  Blood draw for specimens, discussions with consultants, evaluation of patient's response to treatment, examination of patient, obtaining history from patient or surrogate, ordering and performing treatments and interventions, ordering and review of laboratory studies, ordering and review of radiographic studies, re-evaluation of patient's condition, review of old charts and ventilator management   (including critical care time)  Medications Ordered in ED Medications  norepinephrine (LEVOPHED) 4 mg in dextrose 5 % 250 mL (0.016 mg/mL) infusion (40 mcg/min Intravenous Rate/Dose Change February 23, 2016 1440)  vancomycin (VANCOCIN) 2,000 mg in sodium chloride 0.9 % 500 mL IVPB (2,000 mg Intravenous New Bag/Given Feb 23, 2016 1418)  vancomycin (VANCOCIN) IVPB 1000 mg/200 mL premix (not administered)  piperacillin-tazobactam (ZOSYN) IVPB 3.375 g (not administered)  dextrose 50 % solution 50 mL (not administered)  0.9 %   sodium chloride infusion (not administered)  aspirin chewable tablet 324 mg (not administered)    Or  aspirin suppository 300 mg (not administered)  heparin injection 5,000 Units (not administered)  famotidine (PEPCID) 40 MG/5ML suspension 20 mg (not administered)  0.9 %  sodium chloride infusion (not administered)  albuterol (PROVENTIL) (2.5 MG/3ML) 0.083% nebulizer solution 2.5 mg (not administered)  hydrocortisone sodium succinate (SOLU-CORTEF) 100 MG injection 50 mg (not administered)  vasopressin (PITRESSIN) 40 Units in sodium chloride 0.9 % 250 mL (0.16 Units/mL) infusion (not administered)  piperacillin-tazobactam (ZOSYN) IVPB 3.375 g (0 g Intravenous Stopped 23-Feb-2016 1422)  EPINEPHrine (ADRENALIN) 0.1 MG/ML injection (1 mg Intravenous Given 02/23/16 1315)  dextrose 50 % solution (  Given 02-23-16 1441)     Initial Impression / Assessment and Plan / ED Course  I have reviewed the triage vital signs and the nursing notes.  Pertinent labs & imaging results that were available during my care of the patient were reviewed by me and considered in my medical decision making (see chart for details).  Clinical Course   Patient with extensive history brought in several times recently for hypothermia and arrest presents with PEA arrest at nursing facility. Pulses were present on arrival, GCS 13, patient being manually ventilated with BVM. Copious trach secretions, patient hypotensive. Began levophed drip after brief loss of pulses which required one dose of epinephrine. Initiated a code sepsis because of the patient's hypothermia yesterday, copious secretions on exam today, and hypoxia. I did not give patient fluid bolus as he has diffuse anasarca and obvious volume overload with respiratory compromise. I attempted to contact the patient's son but he did not answer his phone. I clarified with the EMS that the son emphasized full code status. Therefore, contacted critical care. Patient admitted to ICU  for further treatment.  Final Clinical Impressions(s) / ED  Diagnoses   Final diagnoses:  Cardiopulmonary arrest (HCC)  Respiratory acidosis  Acute on chronic congestive heart failure, unspecified congestive heart failure type [facility])    New Prescriptions New Prescriptions   No medications on file     Laurence Spates, MD Feb 28, 2016 1610

## 2016-03-13 NOTE — ED Notes (Signed)
Pt has large amounts of secretions from trach and PEG tube; pt with large amounts of edema and stiffness

## 2016-03-13 NOTE — Progress Notes (Signed)
eLink Physician-Brief Progress Note Patient Name: Andrew Reynolds DOB: 31-Aug-1945 MRN: [id number]   Date of Service  02-23-2016  HPI/Events of Note  Asked to review ABG results. No new ABG in EPIC.  eICU Interventions  Will order: 1. NaHCO3 100 meq IV now.  2. Please have RT down load the ABG into EPIC or call me with the results.      Intervention Category Major Interventions: Respiratory failure - evaluation and management  Sommer,Steven Eugene 02/23/16, 6:58 PM

## 2016-03-13 NOTE — Progress Notes (Signed)
Pharmacy Antibiotic Note  Avan Gullett is a 70 y.o. male admitted on 03/17/16 with sepsis.  Pharmacy has been consulted for vancomycin and zosyn dosing.  Pt received vancomycin 2g and zosyn 3.375g IV once in the ED.  Plan: Vancomycin 1g IV every 12 hours.  Goal trough 15-20 mcg/mL. Zosyn 3.375g IV q8h (4 hour infusion).  Monitor culture data, renal function and clinical course [facility] at [facility] prn     No data recorded.   Recent Labs Lab 02/21/16 0920 02/21/16 0928  WBC 8.2  --   CREATININE 0.56*  --   LATICACIDVEN  --  1.41    Estimated Creatinine Clearance: 114.5 mL/min (by C-G formula based on SCr of 0.8 mg/dL).    Allergies  Allergen Reactions  . Sulfa Antibiotics Rash    Antimicrobials this admission: Vanc 9/11 >>  Zosyn 9/11 >>   Dose adjustments this admission: n/a  Microbiology results: 9/10 BCx: GPC (meth-res staph species per BCID)  UCx:    Sputum:    MRSA PCR:    Arlean Hopping. Newman Pies, PharmD, BCPS Clinical Pharmacist Pager [phone] March 17, 2016 1:19 PM

## 2016-03-13 NOTE — Progress Notes (Signed)
eLink Physician-Brief Progress Note Patient Name: Andrus Sharp DOB: [birth year] MRN: [id number]   Date of Service  March 10, 2016  HPI/Events of Note  ABG on 100%/PRVC 30/TV 8 mL/kg/P 18 = 7.85/too high to measure/42. Ppeak in 60's/ Really no room to increase the ventilator settings (rate, TV or PEEP). We are not able to ventilate or oxygenate him even with huge amount of support from the mechanical ventilator. He will code again and ultimately not survive. Son contacted. [place]. Son wants full codee status for now.   eICU Interventions  Will order: 1. NaHCO3 200 meq IV now.      Intervention Category Major Interventions: Acid-Base disturbance - evaluation and management  Josephene Marrone Dennard Nip 03-10-2016, 8:35 PM

## 2016-03-13 NOTE — Code Documentation (Signed)
Critical care MD at bedside 

## 2016-03-13 NOTE — H&P (Addendum)
PULMONARY / CRITICAL CARE MEDICINE   Name: Andrew Reynolds MRN: [id number] DOB: 03-12-1946    ADMISSION DATE:  2016-03-23 CONSULTATION DATE:  03-23-2016   REFERRING MD:  Dr. Rex Kras   CHIEF COMPLAINT:  Cardiac Arrest   HISTORY OF PRESENT ILLNESS:   70 y/o M, [facility] resident, with a PMH of atrial fibrillation, SSS, DVT of lower extremity (previously on anticoagulation) who suffered a fall in February 2017 and developed hematomas at C6-7, C7-8 that resulted in near complete paralysis s/p multiple surgeries at [facility] (07/2015 C3-5, T3-4 laminectomy, 08/2015 Dural AV fistula resection and cervical fusion, C6-7 harware revision 08/2015) and was discharged to [facility]. He then developed a pneumonia and required tracheotomy. He has had multiple UTIs in the last year with multiple courses of antibiotic. Has IVC filter placed (after GIB?) @ [facility] as well as trach and PEG placement. He was discharged to [facility].  The patient has had two admissions since August 2017.  First admit 8/5-8/11 for serratia / pseudomonas PNA, coag neg staph bacteremia and altered mental status.  He returned 8/13 - 8/15 with decreased LOC that was thought related to a cuff leak (tracheostomy was upsized to an XLT).  The patient returned to the ER on 9/3 for an episode of "turning blue" thought related to mucus plugging and discharged back to [facility].  Again, he was seen on 9/10 for generalized edema, bradycardia and hypotension.  ER evaluation found him to be hypothermic, hypotensive and tachycardic.  ABG 7.28 / 53 / 73 / 25.5.   After prolonged observation in the ER, decision was made for him to return to [facility] for further management.  Lasix was attempted but he did not tolerate per report.   The patient returns to [facility] on 9/11 after reports of cardiac arrest (?PEA vs Asystole) at [facility].  Reportedly, the patient suffered a 15 minute cardiac arrest at [facility] prior to ROSC.  He arrested a second time on arrival to [facility] for approx 2  minutes with ROSC.  Levophed gtt was initiated in the ER.  He was difficult to oxygenate despite PEEP of 10 / FiO2 100%.  Initial labs - ABG 6.826 / unable to determine pCO2, PO2 51, HCO3 22.8, Na 126, K 5.1, Cl 91, CO2 19, BUN 36 / sr cr 1.11, glucose <20, AG 16, Alk Phos 146, Albumin 1.6, AST 514, ALT 210, lactic acid 6.36, WBC 7.5, Hgb 9, and platelets 233.    Attempts to reach son unsuccessful (reportedly in [place]).    PCCM called for ICU admission.    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  PAST SURGICAL HISTORY: He  has a past surgical history that includes Tracheostomy.  Allergies  Allergen Reactions  . Sulfa Antibiotics Rash    No current facility-administered medications on file prior to encounter.    Current Outpatient Prescriptions on File Prior to Encounter  Medication Sig  . acetaZOLAMIDE (DIAMOX) 250 MG tablet Place 250  mg into feeding tube every 12 (twelve) hours. FOR FLUID RETENTION  . Amino Acids-Protein Hydrolys (FEEDING SUPPLEMENT, PRO-STAT SUGAR FREE 64,) LIQD Place 30 mLs into feeding tube daily. (Patient not taking: Reported on 01/24/2016)  . chlorhexidine (PERIDEX) 0.12 % solution Use as directed 15 mLs in the  mouth or throat 2 (two) times daily. FOR TRACH  . collagenase (SANTYL) ointment Apply topically daily. Apply Santyl to right buttock wounds Q day, then cover with moist fluffed 2X2 and foam dressing.  (Change foam dressings Q 5 days or PRN soiling.) (Patient taking differently: Apply 1 application topically See admin instructions. Apply to sacrum/right buttock topically every day shift for black slough in wound bed)  . enoxaparin (LOVENOX) 40 MG/0.4ML injection Inject 0.4 mLs (40 mg total) into the skin at bedtime.  . famotidine (PEPCID) 40 MG/5ML suspension Place 2.5 mLs (20 mg total) into feeding tube 2 (two) times daily.  . ferrous sulfate 300 (60 Fe) MG/5ML syrup Place 300 mg into feeding tube every 12 (twelve) hours.   . furosemide (LASIX) 10 MG/ML injection Inject 40 mg into the vein See admin instructions. Inject 40 mg intravenously daily for edema - for 4 days - unknown start date  . gabapentin (NEURONTIN) 100 MG capsule Place 3 capsules (300 mg total) into feeding tube 3 (three) times daily. FOR PAIN  . ipratropium-albuterol (DUONEB) 0.5-2.5 (3) MG/3ML SOLN Take 3 mLs by nebulization every 6 (six) hours. RELATED TO ACUTE AND CHRONIC RESPIRATORY FAILURE, UNSPECIFIED WHETHER WITH HYPOXIA OR HYPERCAPNIA  . LACTOBACILLUS PO Place 1 tablet into feeding tube every 12 (twelve) hours.  . metoprolol tartrate (LOPRESSOR) 25 MG tablet Place 0.5 tablets (12.5 mg total) into feeding tube 2 (two) times daily. FOR A-FIB/HOLD FOR PULSE LESS THAN 55 OR SBP <90  . Nutritional Supplements (FEEDING SUPPLEMENT, VITAL AF 1.2 CAL,) LIQD Place 1,000 mLs into feeding tube continuous. (Patient not taking: Reported on 01/24/2016)  . potassium chloride 20 MEQ/15ML (10%) SOLN Place 20 mEq into feeding tube daily.   . Protein POWD Place 1 scoop into feeding tube every 6 (six) hours. FOR WOUND HEALING  . Sennosides (SENNA) 8.8 MG/5ML SYRP Place 8.6 mg into feeding tube 2 (two) times daily. FOR CONSTIPATION  . vancomycin 500  mg in sodium chloride 0.9 % 100 mL Inject 500 mg into the vein every 12 (twelve) hours. (Patient not taking: Reported on 02/14/2016)  . Water For Irrigation, Sterile (FREE WATER) SOLN Place 250 mLs into feeding tube every 4 (four) hours. (Patient not taking: Reported on 01/24/2016)    FAMILY HISTORY:  His has no family status information on file.    SOCIAL HISTORY: He  reports that he does not drink alcohol.  REVIEW OF SYSTEMS:   Unable to complete with patient due to altered mental status / ventilator need.  SUBJECTIVE:   VITAL SIGNS: BP 110/67   Pulse 78   Temp 99.3 F (37.4 C) (Core (Comment))   Resp (!) 28   Wt 263 lb (119.3 kg)   SpO2 (!) 76%   BMI 35.67 kg/m   HEMODYNAMICS:    VENTILATOR SETTINGS:    INTAKE / OUTPUT: No intake/output data recorded.  PHYSICAL EXAMINATION: General:  Critically ill appearing male in NAD  Neuro:  Obtunded, no response to voice or painful stimuli, L pupil 69m sluggish, R glass eye HEENT:  MM pink/dry, no jvd  Cardiovascular:  s1s2 irr irr, periods of brady into the 40's, no m/r/g  Lungs:  Agonal respirations despite vent support,  lungs coarse bilaterally  Abdomen:  Obese/soft, bsx4 active, PEG site with brown crusting near skin  Musculoskeletal:  No acute deformities Skin:  Mottling of LE's, cool to touch  LABS:  BMET  Recent Labs Lab 02/21/16 0920  NA 128*  K 3.3*  CL 94*  CO2 25  BUN 29*  CREATININE 0.56*  GLUCOSE 102*    Electrolytes  Recent Labs Lab 02/21/16 0920  CALCIUM 8.3*    CBC  Recent Labs Lab 02/21/16 0920  WBC 8.2  HGB 10.1*  HCT 34.6*  PLT 217    Coag's No results for input(s): APTT, INR in the last 168 hours.  Sepsis Markers  Recent Labs Lab 02/21/16 0928  LATICACIDVEN 1.41    ABG  Recent Labs Lab 02/21/16 0945  PHART 7.282*  PCO2ART 53.0*  PO2ART 73.0*    Liver Enzymes  Recent Labs Lab 02/21/16 0920  AST 24  ALT 18  ALKPHOS 131*  BILITOT 0.6  ALBUMIN 1.7*     Cardiac Enzymes No results for input(s): TROPONINI, PROBNP in the last 168 hours.  Glucose No results for input(s): GLUCAP in the last 168 hours.  Imaging No results found.   STUDIES:    CULTURES: BCx2 9/11 >>  Sputum 9/11 >> UA 9/11 >> UC 9/11 >>   ANTIBIOTICS: Vanco (empiric) 9/11 >> Zosyn (empiric) 9/11 >>   SIGNIFICANT EVENTS: 9/11  Admit after 15 min cardiac arrest at [facility], 2 min arrest in ER with ROSC.  Profound acidosis on admit  LINES/TUBES: Chronic Trach >>  Chronic PEG >> R IJ CVC (? Date, note one placed at [facility] on 8/8) >>   DISCUSSION: 70 y/o M with PMH of tetraplegia after cervical injury from hematoma on anticoagulation, [facility] resident admitted on 9/11 after suffering a cardiac arrest.  Repeat arrest in ER with 2 min CPR before ROSC.    ASSESSMENT / PLAN:  PULMONARY A: Acute on Chronic Respiratory Failure - in setting of cardiac arrest Acute Respiratory Acidosis  Pulmonary Edema - in setting of anasarca  Tracheostomy Status  Hx IVC Filter  P:   PRVC 8 cc/kg  Increase PEEP to 18 Pt did not tolerated Bilevel in ER, bradycardia (? If related) Intermittent CXR ABG at 1630 post vent changes   CARDIOVASCULAR A:  Cardiac Arrest - unknown rhythm, reported as asystole or PEA arrest Shock - suspect cardiogenic +/- sepsis  Hx AF, HTN, SSS P:  ICU admit for hemodynamic monitoring  Stress steroids with high dose vasopressors  Levophed for MAP > 65  Add vasopressin for above goal Assess ECHO  Trend troponin   RENAL A:   Anasarca Hyperkalemia  Neurogenic Bladder / Hx Urinary Retention Hx Frequent UTI's with Colonization  P:   Trend BMP / UOP  Replace electrolytes as indicated  Change foley catheter  Hold home lasix, diamox  GASTROINTESTINAL A:   Protein Calorie Malnutrition Chronic PEG  Hx GERD P:   NPO  Insert OGT  Consider restart of TF in am  Pepcid for SUP  PRN colace for bowel regimen  HEMATOLOGIC A:   Anemia Hx  DVT   Hx IVC Filter  P:  Trend CBC  Heparin for DVT prophylaxis  Monitor for bleeding  INFECTIOUS A:   Rule Out Sepsis - multiple sources for infection > trach, central line, PEG Unstageable Pressure Ulcer - sacrum / buttock  Hx Enterococcal & coag neg staph bacteremia, 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, serratia / pseudomonas PNA P:   WOC consult for decubitus  ulcers, present on admit  Pan culture  Empiric abx as above  Place PICC line and remove TLC catheter in neck (unclear how old, R IJ placed here on 8/8)  ENDOCRINE A:   Hypoglycemia   P:   D5NS @ 50 ml/hr  Monitor glucose Q1 hour until > 100   NEUROLOGIC A:   Quadriplegia - remote, C1-4 complete, after fall on anticoagulation with hx of multiple spinal surgeries  Blind in R Eye  P:   RASS goal: 0 Frequent neuro exams PRN fentanyl only for now  RHEUMATOLOGY A: Rheumatoid Arthritis  P:  No acute issues    FAMILY  - Updates: Attempted to call son Andrew Reynolds at [phone].  Reached voicemail, left message for return call.  Son apparently wants full code per [facility] staff.    - Inter-disciplinary family meet or Palliative Care meeting due by:  Ongoing.     Noe Gens, NP-C [facility] Pgr: [phone] or if no answer [phone] 03-14-16, 3:04 PM    ATTENDING NOTE / ATTESTATION NOTE :   I have discussed the case with the resident/APP  Noe Gens  I agree with the resident/APP's  history, physical examination, assessment, and plans.    I have edited the above note and modified it according to our agreed history, physical examination, assessment and plan.   Briefly, 70 year old male from [facility], on chronic ventilation, with several comorbidities, recent fall in February 2017 which resulted in complete paralysis. He has had several cervical surgery. Because of paralysis, he is ventilator dependent. Also has a tracheostomy. Patient would recent admission for Serratia and Pseudomonas  pneumonia and coagulase-negative staph bacteremia. He was admitted from August 13 until August 15 with decreased loss of consciousness thought to be related to mucous plug and a cuff leak. Trache was upsized. He has since backed to the emergency room several times. The last time he went to the ER was on 9/10 for hypothermia, bradycardia, anasarca, hypertension. ABG 7.28/53/73. After prolonged observation in the ER, decision was made to for him to return to [facility] for further management. Lasix was attempted and patient did not tolerated.   Today, pt had a 15 minute cardiac arrest at [facility]. He was transferred to [facility] emergency room. He has since had 2 arrests since being at the ER. Currently hypotensive with pressors being titrated up (levophed,vasopressin). O2 sats 70-80% on 100% FiO2, PEEP of 18. He is currently on volume control mode of ventilation. I tried pressure control mode as well as Bilevel mode and his sats were lower. HR in 50s (despite high pressors). Agonal breathing. Moribund. Awake but does not follow commands. Pupils nonreactive. (-) corneals elicited. (-) movement on deep sternal rub. He was breathing over the vent. He had prominent neck veins. He had a right TLC (chronic), did not look grossly infected. Copious frothy secretions were spewing out from the trach. Diminished breath sounds bilaterally. Crackles on BLF. Soft heart sounds. dec bowel sounds. Soft. No tenderness. No masses. Anasarcous. Cool extremities.  CXR with bilateral infiltrates consistent with pulmonary edema. ABG 6.8/>150 CO2/51 on 100%. Lactic acid is 6. WBC 7.5. Mobility 9, hematocrit 33. Creatinine 1.11. Bicarbonate 19. Sugar was less than 20.   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 Patient is more than adequately volume resuscitated as he came in with gross pulmonary edema which I think is the main driving force for the arrest, hypoxemia, lactic acidosis. I think acute on chronic pulmonary edema is the main reason for patient's condition right now. We cannot rule out sepsis altogether. He had frothy secretion spewing him his tracheostomy and he has bilateral crackles. Giving him more fluid bolus will decrease his O2 saturation and make things worse for him. Since being at the ED, he has been started on levo fed. Plan to titrate up levo fed, add Neo-Synephrine, and continue with vasopressin. We'll start him on maintenance fluids for his hypoglycemia.  Continue ventilatory support. Continue current settings. Difficult to oxygenate. Hopefully, once blood pressures better, we can start gently diureses saying as blood pressure allows.  Panculture. Continue broad-spectrum antibiotics with vancomycin and Zosyn.  Start D10 IV fluids for hypoglycemia.  We have been trying to get hold of patient's son to no avail. We were told, he is currently in [place].  We will continue managing this patient as a full code. Continue to get hold of patient's family. Prognosis is poor. He seems to have anoxic ischemic encephalopathy. I do not think he'll survive this. Total downtime is 19 minutes so far.  Critical care time with this patient today: 60 minutes.Monica Becton, MD 2016/02/29, 4:07 PM [facility] Pager [phone] After 3 pm or if no answer, call [phone]

## 2016-03-13 NOTE — Progress Notes (Signed)
Wasted 225 ccs of morphine in sink with Melissa Montane, RN.

## 2016-03-13 NOTE — Discharge Summary (Signed)
PULMONARY / CRITICAL CARE MEDICINE   Name: Andrew Reynolds MRN: [id number] DOB: 15-Dec-1945    ADMISSION DATE:  03-09-2016 CONSULTATION DATE:  09-Mar-2016   REFERRING MD:  Dr. Rex Kras   CHIEF COMPLAINT:  Cardiac Arrest   HISTORY OF PRESENT ILLNESS:   70 y/o M, [facility] resident, with a PMH of atrial fibrillation, SSS, DVT of lower extremity (previously on anticoagulation) who suffered a fall in February 2017 and developed hematomas at C6-7, C7-8 that resulted in near complete paralysis s/p multiple surgeries at [facility] (07/2015 C3-5, T3-4 laminectomy, 08/2015 Dural AV fistula resection and cervical fusion, C6-7 harware revision 08/2015) and was discharged to [facility]. He then developed a pneumonia and required tracheotomy. He has had multiple UTIs in the last year with multiple courses of antibiotic. Has IVC filter placed (after GIB?) @ [facility] as well as trach and PEG placement. He was discharged to [facility].  The patient has had two admissions since August 2017.  First admit 8/5-8/11 for serratia / pseudomonas PNA, coag neg staph bacteremia and altered mental status.  He returned 8/13 - 8/15 with decreased LOC that was thought related to a cuff leak (tracheostomy was upsized to an XLT).  The patient returned to the ER on 9/3 for an episode of "turning blue" thought related to mucus plugging and discharged back to [facility].  Again, he was seen on 9/10 for generalized edema, bradycardia and hypotension.  ER evaluation found him to be hypothermic, hypotensive and tachycardic.  ABG 7.28 / 53 / 73 / 25.5.   After prolonged observation in the ER, decision was made for him to return to [facility] for further management.  Lasix was attempted but he did not tolerate per report.   The patient returns to [facility] on 9/11 after reports of cardiac arrest (?PEA vs Asystole) at [facility].  Reportedly, the patient suffered a 15 minute cardiac arrest at [facility] prior to ROSC.  He arrested a second time on arrival to [facility] for approx 2  minutes with ROSC.  Levophed gtt was initiated in the ER.  He was difficult to oxygenate despite PEEP of 10 / FiO2 100%.  Initial labs - ABG 6.826 / unable to determine pCO2, PO2 51, HCO3 22.8, Na 126, K 5.1, Cl 91, CO2 19, BUN 36 / sr cr 1.11, glucose <20, AG 16, Alk Phos 146, Albumin 1.6, AST 514, ALT 210, lactic acid 6.36, WBC 7.5, Hgb 9, and platelets 233.    Attempts to reach son unsuccessful (reportedly in [place]).    PCCM called for ICU admission.    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  PAST SURGICAL HISTORY: He  has a past surgical history that includes Tracheostomy.  Allergies  Allergen Reactions  . Sulfa Antibiotics Rash    No current facility-administered medications on file prior to encounter.    Current Outpatient Prescriptions on File Prior to Encounter  Medication Sig  . acetaZOLAMIDE (DIAMOX) 250 MG tablet Place 250  mg into feeding tube every 12 (twelve) hours. FOR FLUID RETENTION  . Amino Acids-Protein Hydrolys (FEEDING SUPPLEMENT, PRO-STAT SUGAR FREE 64,) LIQD Place 30 mLs into feeding tube daily. (Patient not taking: Reported on 01/24/2016)  . chlorhexidine (PERIDEX) 0.12 % solution Use as directed 15 mLs in the  mouth or throat 2 (two) times daily. FOR TRACH  . collagenase (SANTYL) ointment Apply topically daily. Apply Santyl to right buttock wounds Q day, then cover with moist fluffed 2X2 and foam dressing.  (Change foam dressings Q 5 days or PRN soiling.) (Patient taking differently: Apply 1 application topically See admin instructions. Apply to sacrum/right buttock topically every day shift for black slough in wound bed)  . enoxaparin (LOVENOX) 40 MG/0.4ML injection Inject 0.4 mLs (40 mg total) into the skin at bedtime.  . famotidine (PEPCID) 40 MG/5ML suspension Place 2.5 mLs (20 mg total) into feeding tube 2 (two) times daily.  . ferrous sulfate 300 (60 Fe) MG/5ML syrup Place 300 mg into feeding tube every 12 (twelve) hours.   . furosemide (LASIX) 10 MG/ML injection Inject 40 mg into the vein See admin instructions. Inject 40 mg intravenously daily for edema - for 4 days - unknown start date  . gabapentin (NEURONTIN) 100 MG capsule Place 3 capsules (300 mg total) into feeding tube 3 (three) times daily. FOR PAIN  . ipratropium-albuterol (DUONEB) 0.5-2.5 (3) MG/3ML SOLN Take 3 mLs by nebulization every 6 (six) hours. RELATED TO ACUTE AND CHRONIC RESPIRATORY FAILURE, UNSPECIFIED WHETHER WITH HYPOXIA OR HYPERCAPNIA  . LACTOBACILLUS PO Place 1 tablet into feeding tube every 12 (twelve) hours.  . metoprolol tartrate (LOPRESSOR) 25 MG tablet Place 0.5 tablets (12.5 mg total) into feeding tube 2 (two) times daily. FOR A-FIB/HOLD FOR PULSE LESS THAN 55 OR SBP <90  . Nutritional Supplements (FEEDING SUPPLEMENT, VITAL AF 1.2 CAL,) LIQD Place 1,000 mLs into feeding tube continuous. (Patient not taking: Reported on 01/24/2016)  . potassium chloride 20 MEQ/15ML (10%) SOLN Place 20 mEq into feeding tube daily.   . Protein POWD Place 1 scoop into feeding tube every 6 (six) hours. FOR WOUND HEALING  . Sennosides (SENNA) 8.8 MG/5ML SYRP Place 8.6 mg into feeding tube 2 (two) times daily. FOR CONSTIPATION  . vancomycin 500  mg in sodium chloride 0.9 % 100 mL Inject 500 mg into the vein every 12 (twelve) hours. (Patient not taking: Reported on 02/14/2016)  . Water For Irrigation, Sterile (FREE WATER) SOLN Place 250 mLs into feeding tube every 4 (four) hours. (Patient not taking: Reported on 01/24/2016)    FAMILY HISTORY:  His has no family status information on file.    SOCIAL HISTORY: He  reports that he does not drink alcohol.  REVIEW OF SYSTEMS:   Unable to complete with patient due to altered mental status / ventilator need.  SUBJECTIVE:   VITAL SIGNS: BP (!) 40/31   Pulse (!) 0   Temp 99.5 F (37.5 C) (Oral)   Resp (!) 30   Ht '5\' 9"'  (1.753 m)   Wt 119.2 kg (262 lb 12.6 oz)   SpO2 (!) 0%   BMI 38.81 kg/m   HEMODYNAMICS:    VENTILATOR SETTINGS: Vent Mode: PRVC FiO2 (%):  [100 %] 100 % Set Rate:  [30 bmp] 30 bmp Vt Set:  [550 mL] 550 mL PEEP:  [18 cmH20] 18 cmH20 Plateau Pressure:  [57 cmH20] 57 cmH20  INTAKE / OUTPUT: No intake/output data recorded.  PHYSICAL EXAMINATION: General:  Critically ill appearing male in NAD  Neuro:  Obtunded, no response to voice or painful stimuli, L pupil 39m sluggish, R glass eye HEENT:  MM pink/dry, no jvd  Cardiovascular:  s1s2 irr irr, periods of brady into the 40's, no m/r/g  Lungs:  Agonal respirations despite vent support, lungs coarse bilaterally  Abdomen:  Obese/soft, bsx4 active, PEG site with brown crusting near skin  Musculoskeletal:  No acute deformities Skin:  Mottling of LE's, cool to touch  LABS:  BMET  Recent Labs Lab 02/21/16 0920 [id number]/[id number]  NA 128* 126*  K 3.3* 5.1  CL 94* 91*  CO2 25 19*  BUN 29* 36*  CREATININE 0.56* 1.11  GLUCOSE 102* <20*    Electrolytes  Recent Labs Lab 02/21/16 0920 [id number]  CALCIUM 8.3* 8.4*    CBC  Recent Labs Lab 02/21/16 0920 [id number]  WBC 8.2 7.5  HGB 10.1* 9.0*  HCT 34.6* 33.2*  PLT 217 233    Coag's No results for input(s): APTT, INR in the last 168  hours.  Sepsis Markers  Recent Labs Lab 02/21/16 0928 [id number]  LATICACIDVEN 1.41 6.36* 7.2*    ABG  Recent Labs Lab [id number]/27/[id number] 0Sep 27, [id number] 027-Sep-20172019  PHART 6.826* BELOW REPORTABLE RANGE 6.85*  PCO2ART ABOVE REPORTABLE RANGE ABOVE REPORTABLE RANGE ABOVE REPORTABLE RANGE  PO2ART 51.1* 50.9* 42.2*    Liver Enzymes  Recent Labs Lab 02/21/16 0920 [id number]/[id number]  AST 24 514*  ALT 18 210*  ALKPHOS 131* 146*  BILITOT 0.6 0.8  ALBUMIN 1.7* 1.6*    Cardiac Enzymes  Recent Labs Lab 009/27/[id number]  TROPONINI 2.17*    Glucose  Recent Labs Lab [id number]/[id number] 0September 27, 20171654  GLUCAP 36* 91 90    Imaging No results found.   STUDIES:    CULTURES: BCx2 9/11 >>  Sputum 9/11 >> UA 9/11 >> UC 9/11 >>   ANTIBIOTICS: Vanco (empiric) 9/11 >> Zosyn (empiric) 9/11 >>   SIGNIFICANT EVENTS: 9/11  Admit after 15 min cardiac arrest at [facility], 2 min arrest in ER with ROSC.  Profound acidosis on admit  LINES/TUBES: Chronic Trach >>  Chronic PEG >> R IJ CVC (? [facility] >>   DISCUSSION: 70y/o M with PMH of tetraplegia after cervical injury from hematoma on anticoagulation, [facility] resident admitted on 9/11 after suffering a cardiac arrest.  Repeat arrest in ER with 2 min CPR before ROSC.    ASSESSMENT / PLAN:  PULMONARY A: Acute on Chronic Respiratory Failure - in setting of cardiac arrest Acute Respiratory Acidosis  Pulmonary Edema - in setting of anasarca  Tracheostomy Status  Hx IVC Filter  P:   PRVC 8 cc/kg  Increase PEEP to 18 Pt did not tolerated Bilevel in ER, bradycardia (? If related) Intermittent CXR ABG at 1630 post vent changes   CARDIOVASCULAR A:  Cardiac Arrest - unknown rhythm, reported as asystole or PEA arrest Shock - suspect cardiogenic +/- sepsis  Hx AF, HTN, SSS P:  ICU admit for hemodynamic monitoring  Stress steroids with high dose vasopressors  Levophed  for MAP > 65  Add vasopressin for above goal Assess ECHO  Trend troponin   RENAL A:   Anasarca Hyperkalemia  Neurogenic Bladder / Hx Urinary Retention Hx Frequent UTI's with Colonization  P:   Trend BMP / UOP  Replace electrolytes as indicated  Change foley catheter  Hold home lasix, diamox  GASTROINTESTINAL A:   Protein Calorie Malnutrition Chronic PEG  Hx GERD P:   NPO  Insert OGT  Consider restart of TF in am  Pepcid for SUP  PRN colace for bowel regimen  HEMATOLOGIC A:   Anemia Hx DVT   Hx IVC Filter  P:  Trend CBC  Heparin for DVT prophylaxis  Monitor for bleeding  INFECTIOUS A:   Rule Out Sepsis - multiple sources for infection > trach, central line, PEG Unstageable Pressure Ulcer - sacrum / buttock  Hx Enterococcal & coag neg staph bacteremia, 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, serratia / pseudomonas PNA P:   WOC consult for decubitus ulcers, present on admit  Pan culture  Empiric abx as above  Place PICC line and remove TLC catheter in neck (unclear how old, R IJ placed here on 8/8)  ENDOCRINE A:   Hypoglycemia   P:   D5NS @ 50 ml/hr  Monitor glucose Q1 hour until > 100   NEUROLOGIC A:   Quadriplegia - remote, C1-4 complete, after fall on anticoagulation with hx of multiple spinal surgeries  Blind in R Eye  P:   RASS goal: 0 Frequent neuro exams PRN fentanyl only for now  RHEUMATOLOGY A: Rheumatoid Arthritis  P:  No acute issues    FAMILY  - Updates: Attempted to call son Zyheir Daft at [phone].  Reached voicemail, left message for return call.  Son apparently wants full code per [facility] staff.    - Inter-disciplinary family meet or Palliative Care meeting due by:  Ongoing.     Noe Gens, NP-C [facility] Pgr: [phone] or if no answer [phone] 02/23/2016, 2:40 PM    ATTENDING NOTE / ATTESTATION NOTE :   I have discussed the case with the resident/APP  Noe Gens  I agree with the resident/APP's   history, physical examination, assessment, and plans.    I have edited the above note and modified it according to our agreed history, physical examination, assessment and plan.   Briefly, 70 year old male from [facility], on chronic ventilation, with several comorbidities, recent fall in February 2017 which resulted in complete paralysis. He has had several cervical surgery. Because of paralysis, he is ventilator dependent. Also has a tracheostomy. Patient would recent admission for Serratia and Pseudomonas pneumonia and coagulase-negative staph bacteremia. He was admitted from August 13 until August 15 with decreased loss of consciousness thought to be related to mucous plug and a cuff leak. Trache was upsized. He has since backed to the emergency room several times. The last time he went to the ER was on 9/10 for hypothermia, bradycardia, anasarca, hypertension. ABG 7.28/53/73. After prolonged observation in the ER, decision was made to for him to return to [facility] for further management. Lasix was attempted and patient did not tolerated.   Today, pt had a 15 minute cardiac arrest at [facility]. He was transferred to [facility] emergency room. He has since had 2 arrests since being at the ER. Currently hypotensive with pressors being titrated up (levophed,vasopressin). O2 sats 70-80% on 100% FiO2, PEEP of 18. He is currently on volume control mode of ventilation. I tried pressure control mode as well as Bilevel mode and his sats were lower. HR in 50s (despite high pressors). Agonal breathing. Moribund. Awake but does not follow commands. Pupils nonreactive. (-) corneals elicited. (-) movement on deep sternal rub. He was breathing over the vent. He had prominent neck veins. He had a right TLC (chronic), did not look grossly infected. Copious frothy secretions were spewing out from the trach. Diminished  breath sounds bilaterally. Crackles on BLF. Soft heart sounds. dec bowel sounds. Soft. No tenderness. No masses.  Anasarcous. Cool extremities.  CXR with bilateral infiltrates consistent with pulmonary edema. ABG 6.8/>150 CO2/51 on 100%. Lactic acid is 6. WBC 7.5. Mobility 9, hematocrit 33. Creatinine 1.11. Bicarbonate 19. Sugar was less than 20.   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 Patient is more than adequately volume resuscitated as he came in with gross pulmonary edema which I think is the main driving force for the arrest, hypoxemia, lactic acidosis. I think acute on chronic pulmonary edema is the main reason for patient's condition right now. We cannot rule out sepsis altogether. He had frothy secretion spewing him his tracheostomy and he has bilateral crackles. Giving him more fluid bolus will decrease his O2 saturation and make things worse for him. Since being at the ED, he has been started on levo fed. Plan to titrate up levo fed, add Neo-Synephrine, and continue with vasopressin. We'll start him on maintenance fluids for his hypoglycemia.  Continue ventilatory support. Continue current settings. Difficult to oxygenate. Hopefully, once blood pressures better, we can start gently diureses saying as blood pressure allows.  Panculture. Continue broad-spectrum antibiotics with vancomycin and Zosyn.  Start D10 IV fluids for hypoglycemia.  We have been trying to get hold of patient's son to no avail. We were told, he is currently in [place].  We will continue managing this patient as a full code. Continue to get hold of patient's family. Prognosis is poor. He seems to have anoxic ischemic encephalopathy. I do not think he'll survive this. Total downtime  is 19 minutes so far.  Critical care time with this patient today: 60 minutes.Monica Becton, MD 02/23/2016, 2:40 PM [facility] Pager [phone] After 3 pm or if no answer, call [phone]     Addendum: Pt remained critically ill, difficult to oxygenate and ventilate, and with severe metabolic acidosis.  Son finally arrived. Son decided to withdraw support and pt went into asystole on 23:10 9/11. Family at bedside.   Monica Becton, MD 02/23/2016, 2:42 PM [facility] Pager [phone] After 3 pm or if no answer, call [phone]

## 2016-03-13 DEATH — deceased

## 2017-02-14 IMAGING — CR DG CHEST 1V PORT
1 series · 1 of 1 positions shown · non-contrast
Comparison: 01/13/2016

CLINICAL DATA: Hypoxia. Received pt from [facility] with c/o
15-20 mins of unresponsiveness and bradycardia. Pt seen here last
[REDACTED] for decrease level of consciousness and had a CO2 of 155. At
that time pt was diagnosed with HCAP. Pt unable to give hx at this
time.

EXAM:
PORTABLE CHEST 1 VIEW

[AP]
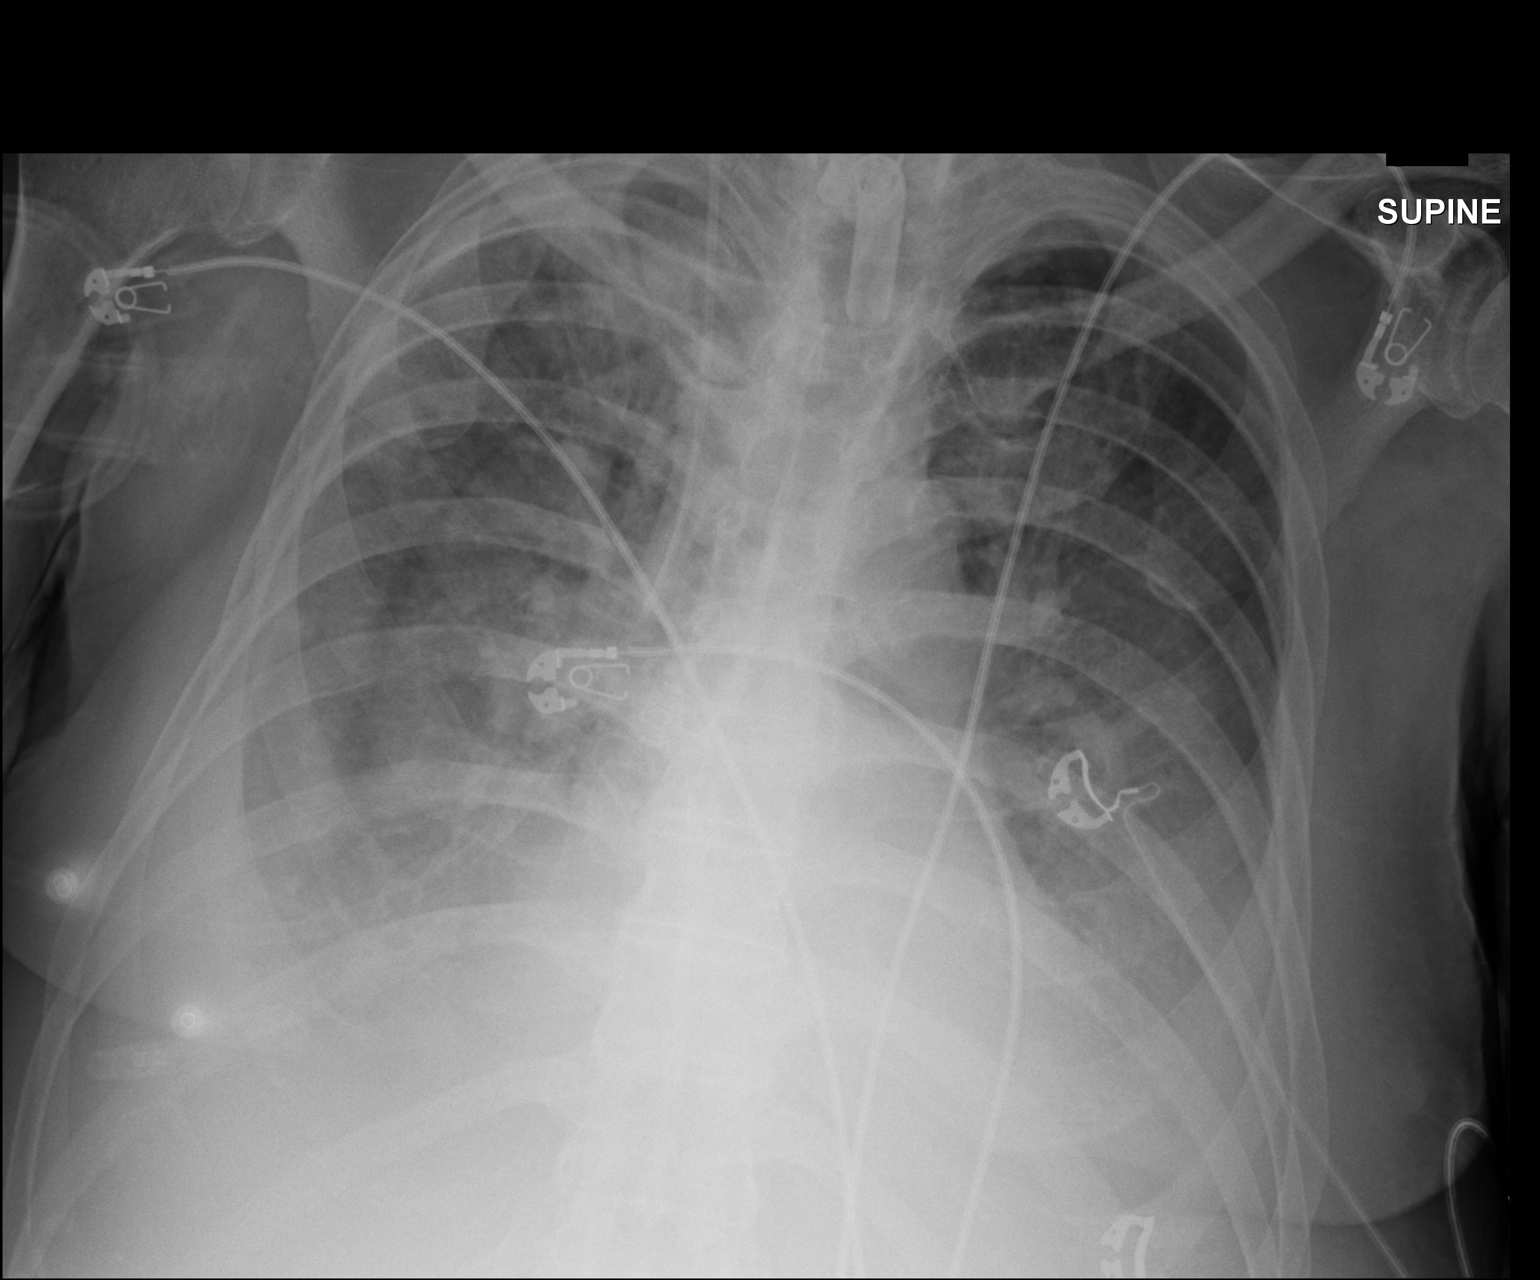

[1 of 1 positions shown; findings below may reference images not displayed]

FINDINGS: Moderate bilateral pleural effusions obscure the hemidiaphragms and
most of the heart borders. There is central hazy opacity that is
likely combination of pulmonary edema and layering pleural fluid on
this semi supine exam.

Cardiac silhouette is grossly normal in size. No mediastinal or
hilar masses.

Tracheostomy tube and right internal jugular central venous line are
stable in well positioned.

No gross pneumothorax.
IMPRESSION: 1. Moderate effusions. Hazy central lower lung zone opacity
suggesting a combination of pulmonary edema and opacity from
posterior layering pleural fluid. Pneumonia is possible.
2. Findings are similar to prior study allowing for differences in
patient positioning.
3. Support apparatus is stable and well positioned.

## 2017-03-07 IMAGING — CR DG CHEST 1V PORT
2 series · 2 of 2 positions shown · non-contrast
Comparison: 01/24/2016

CLINICAL DATA: Pt. Unresponsive; recovering from pneumonia.

EXAM:
PORTABLE CHEST 1 VIEW

[AP (1 of 2)]
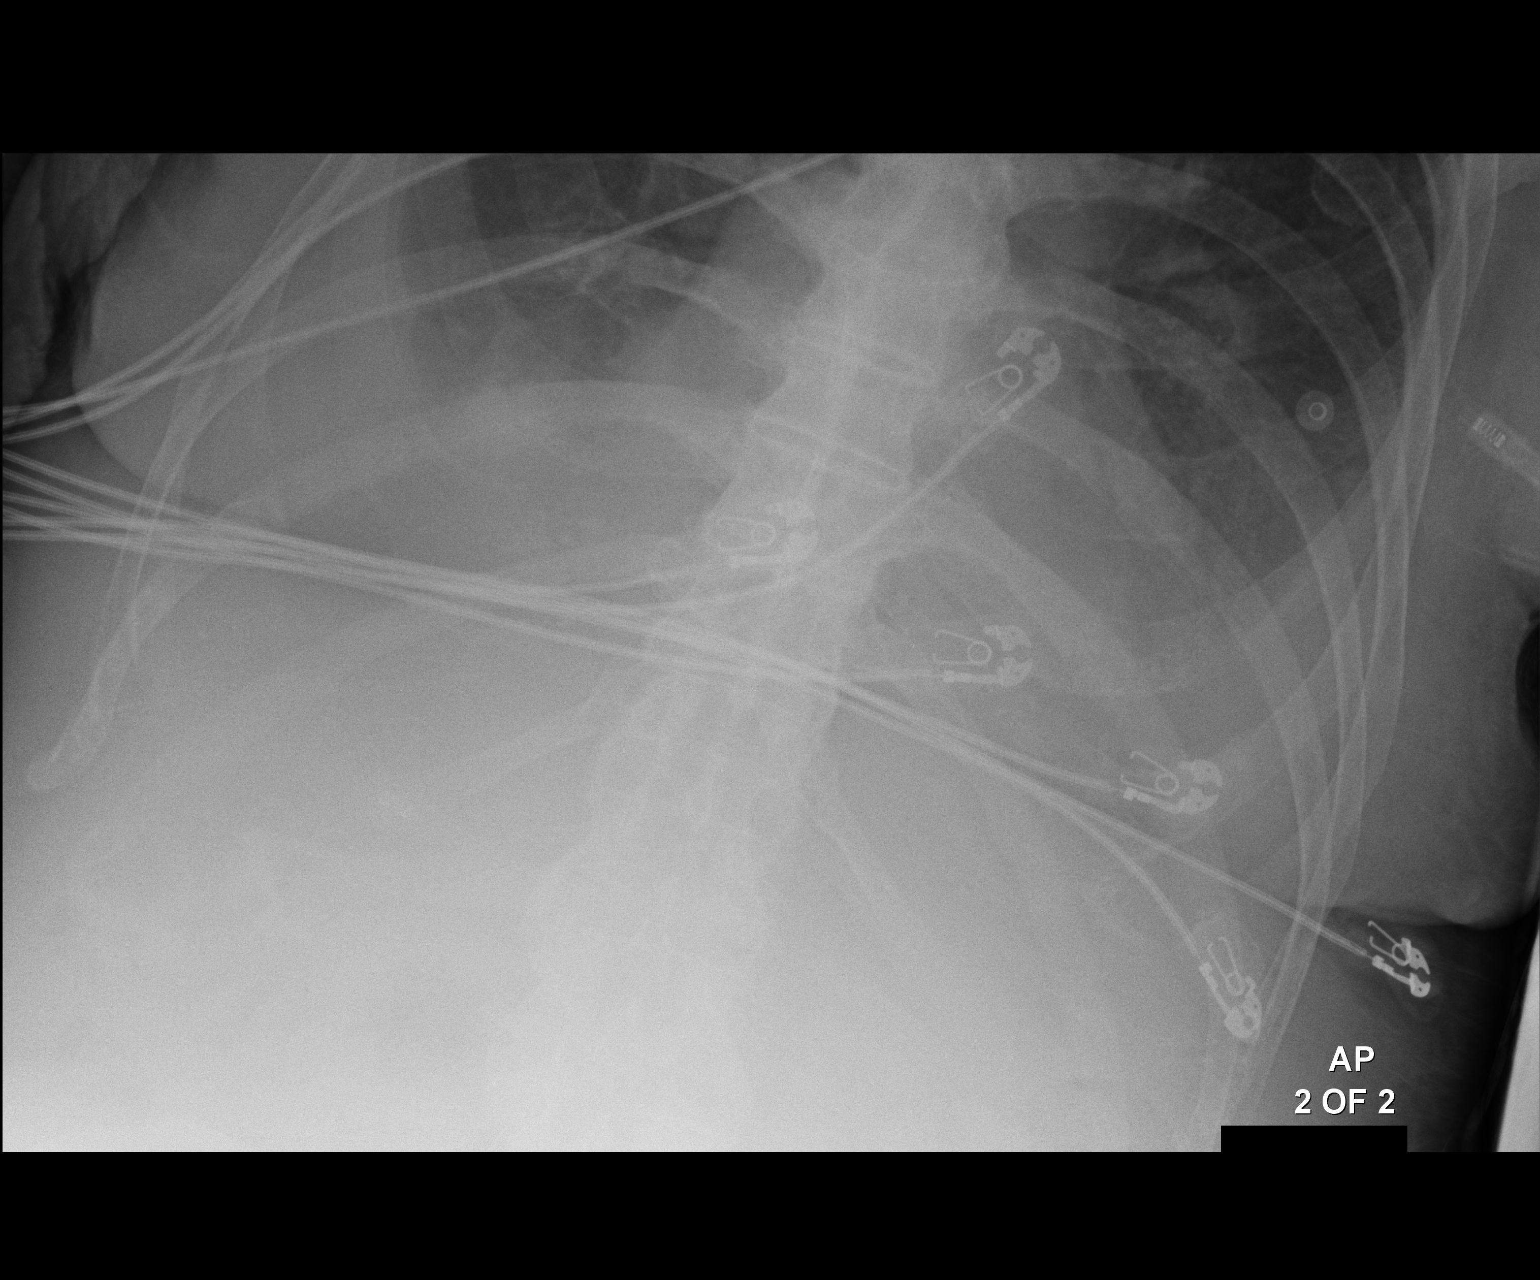

[AP (2 of 2)]
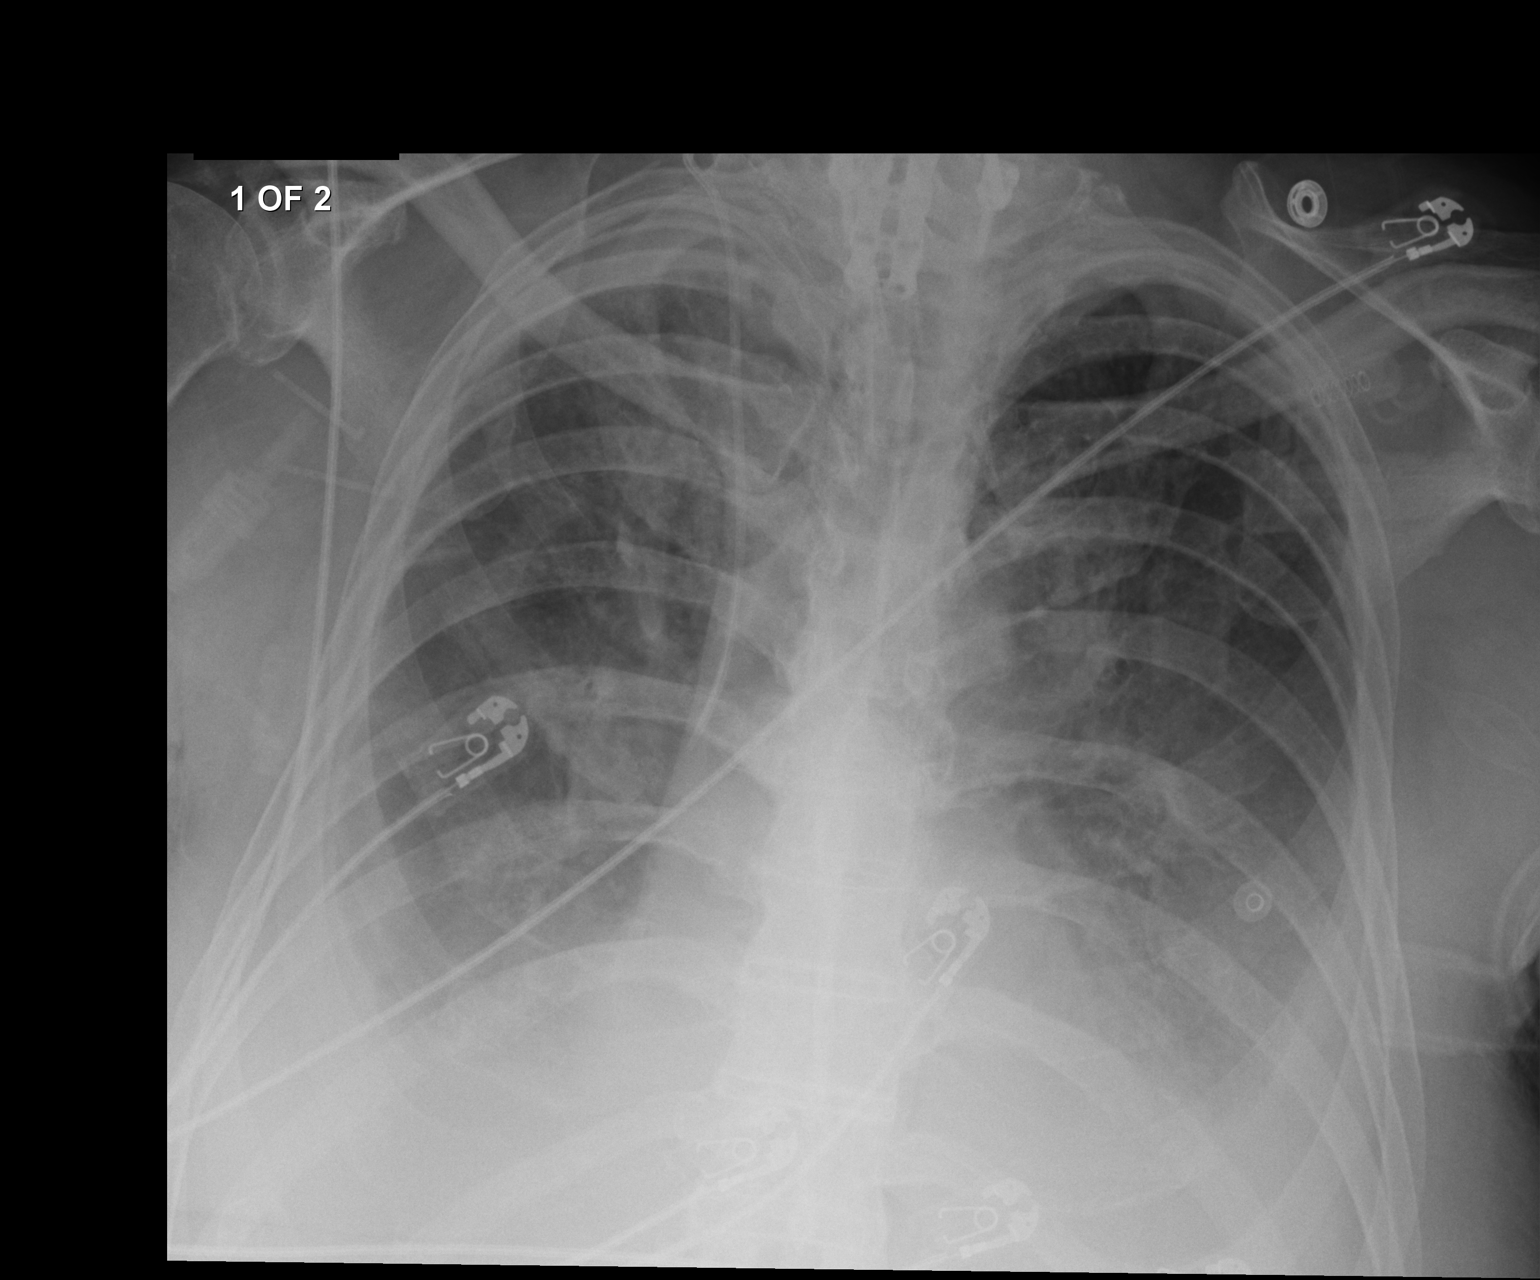

[2 of 2 positions shown; findings below may reference images not displayed]

FINDINGS: Right IJ central line tip overlies the level of this lower superior
vena cava. There are bilateral pleural effusions and hazy densities
throughout the lungs bilaterally, similar in appearance to prior
study. The heart margins are obscured by a lung opacities.
IMPRESSION: Persistent significant bilateral lung opacities and bilateral
pleural effusions.

## 2017-03-14 IMAGING — CR DG CHEST 1V PORT
1 series · 1 of 1 positions shown · non-contrast
Comparison: 02/14/2016

CLINICAL DATA: Decreasing oxygen saturations.

EXAM:
PORTABLE CHEST 1 VIEW

[AP]
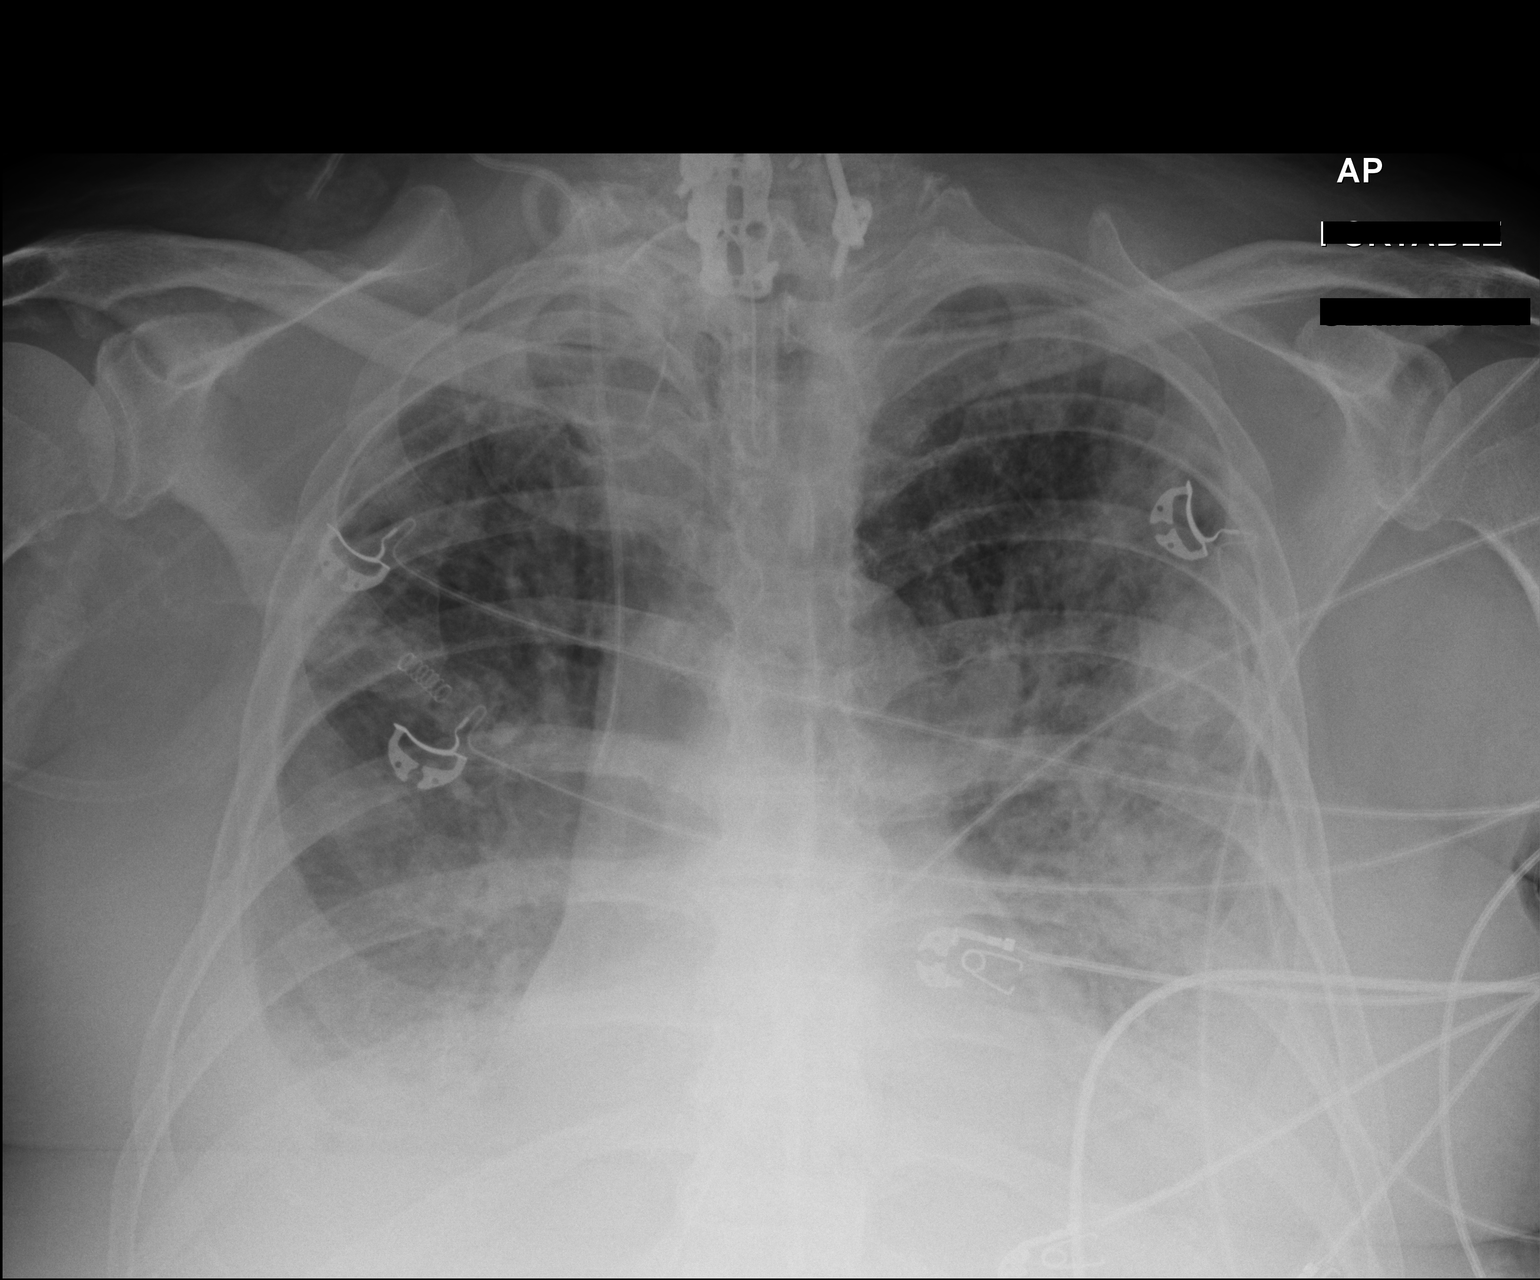

[1 of 1 positions shown; findings below may reference images not displayed]

FINDINGS: Endotracheal tube tip is 5.5 cm above the base of the carina. Right
IJ central line tip overlies the mid to distal SVC level. The
bilateral asymmetric, left greater than right, airspace disease seen
on the current study has progressed slightly in the interval.
Persistent bilateral pleural effusions noted. The cardio pericardial
silhouette is enlarged.
IMPRESSION: Worsening bilateral airspace disease suggest progressing pneumonia.

Bilateral pleural effusions.

## 2017-03-15 IMAGING — CR DG CHEST 1V PORT
1 series · 1 of 1 positions shown · non-contrast
Comparison: 02/21/2016

CLINICAL DATA: Followup cardiopulmonary resuscitation

EXAM:
PORTABLE CHEST 1 VIEW

[AP]
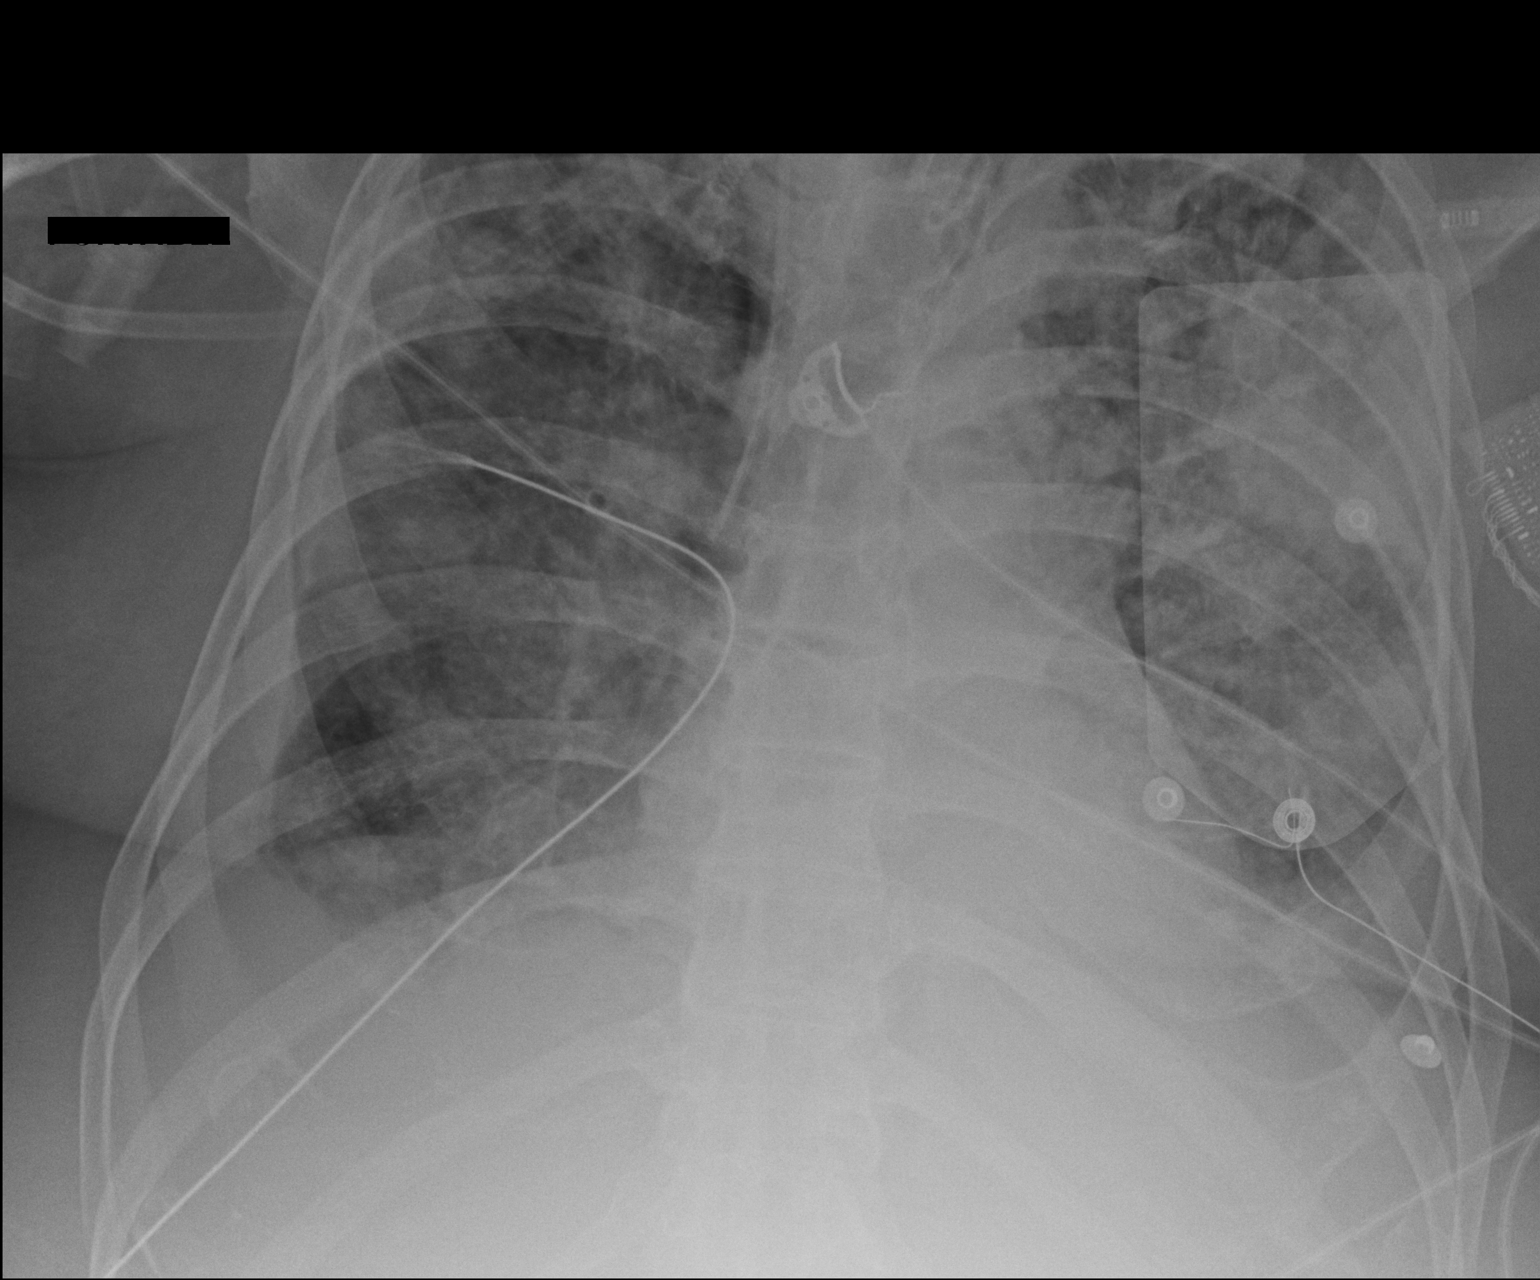

[1 of 1 positions shown; findings below may reference images not displayed]

FINDINGS: Endotracheal tube has its tip 6 cm above the carina. Right internal
jugular central line has its tip in the SVC above the right atrium.
Artifact overlies the chest. There is diffuse pulmonary edema. There
are bilateral pleural effusions. No evidence of pneumothorax or
visible rib fracture.
IMPRESSION: Diffuse pulmonary edema. Bilateral effusions. No pneumothorax or rib
fracture. Endotracheal tube and central line satisfactory.
